# Patient Record
Sex: Female | Born: 1935 | Race: White | Hispanic: No | Marital: Married | Smoking: Former smoker
Health system: Southern US, Community
[De-identification: ages and names within clinical notes are randomized; demographics above are authoritative.]

## PROBLEM LIST (undated history)

## (undated) DIAGNOSIS — R519 Headache, unspecified: Secondary | ICD-10-CM

## (undated) DIAGNOSIS — K219 Gastro-esophageal reflux disease without esophagitis: Secondary | ICD-10-CM

## (undated) DIAGNOSIS — I10 Essential (primary) hypertension: Secondary | ICD-10-CM

## (undated) DIAGNOSIS — K279 Peptic ulcer, site unspecified, unspecified as acute or chronic, without hemorrhage or perforation: Secondary | ICD-10-CM

## (undated) DIAGNOSIS — E785 Hyperlipidemia, unspecified: Secondary | ICD-10-CM

## (undated) DIAGNOSIS — A0472 Enterocolitis due to Clostridium difficile, not specified as recurrent: Secondary | ICD-10-CM

## (undated) DIAGNOSIS — K5790 Diverticulosis of intestine, part unspecified, without perforation or abscess without bleeding: Secondary | ICD-10-CM

## (undated) DIAGNOSIS — R51 Headache: Secondary | ICD-10-CM

## (undated) DIAGNOSIS — N179 Acute kidney failure, unspecified: Secondary | ICD-10-CM

## (undated) DIAGNOSIS — J189 Pneumonia, unspecified organism: Secondary | ICD-10-CM

## (undated) DIAGNOSIS — F32A Depression, unspecified: Secondary | ICD-10-CM

## (undated) DIAGNOSIS — F329 Major depressive disorder, single episode, unspecified: Secondary | ICD-10-CM

## (undated) DIAGNOSIS — K7689 Other specified diseases of liver: Secondary | ICD-10-CM

## (undated) DIAGNOSIS — K635 Polyp of colon: Secondary | ICD-10-CM

## (undated) DIAGNOSIS — B9681 Helicobacter pylori [H. pylori] as the cause of diseases classified elsewhere: Secondary | ICD-10-CM

## (undated) DIAGNOSIS — K2970 Gastritis, unspecified, without bleeding: Secondary | ICD-10-CM

## (undated) HISTORY — PX: COLONOSCOPY: SHX174

## (undated) HISTORY — DX: Peptic ulcer, site unspecified, unspecified as acute or chronic, without hemorrhage or perforation: K27.9

## (undated) HISTORY — DX: Diverticulosis of intestine, part unspecified, without perforation or abscess without bleeding: K57.90

## (undated) HISTORY — DX: Hyperlipidemia, unspecified: E78.5

## (undated) HISTORY — DX: Other specified diseases of liver: K76.89

## (undated) HISTORY — DX: Gastritis, unspecified, without bleeding: K29.70

## (undated) HISTORY — DX: Polyp of colon: K63.5

## (undated) HISTORY — PX: ROTATOR CUFF REPAIR: SHX139

## (undated) HISTORY — DX: Helicobacter pylori (H. pylori) as the cause of diseases classified elsewhere: B96.81

## (undated) HISTORY — PX: TONSILLECTOMY AND ADENOIDECTOMY: SUR1326

## (undated) HISTORY — DX: Essential (primary) hypertension: I10

## (undated) HISTORY — PX: ABDOMINAL HYSTERECTOMY: SHX81

---

## 1985-12-10 DIAGNOSIS — K279 Peptic ulcer, site unspecified, unspecified as acute or chronic, without hemorrhage or perforation: Secondary | ICD-10-CM

## 1985-12-10 DIAGNOSIS — B9681 Helicobacter pylori [H. pylori] as the cause of diseases classified elsewhere: Secondary | ICD-10-CM

## 1985-12-10 HISTORY — DX: Peptic ulcer, site unspecified, unspecified as acute or chronic, without hemorrhage or perforation: K27.9

## 1985-12-10 HISTORY — DX: Helicobacter pylori (H. pylori) as the cause of diseases classified elsewhere: B96.81

## 1999-05-25 ENCOUNTER — Other Ambulatory Visit: Admission: RE | Admit: 1999-05-25 | Discharge: 1999-05-25 | Payer: Self-pay | Admitting: Gynecology

## 2000-07-08 ENCOUNTER — Ambulatory Visit (HOSPITAL_COMMUNITY): Admission: RE | Admit: 2000-07-08 | Discharge: 2000-07-08 | Payer: Self-pay | Admitting: Gynecology

## 2000-07-08 ENCOUNTER — Encounter: Payer: Self-pay | Admitting: Gynecology

## 2000-07-15 ENCOUNTER — Other Ambulatory Visit: Admission: RE | Admit: 2000-07-15 | Discharge: 2000-07-15 | Payer: Self-pay | Admitting: Gynecology

## 2001-04-15 ENCOUNTER — Encounter (INDEPENDENT_AMBULATORY_CARE_PROVIDER_SITE_OTHER): Payer: Self-pay | Admitting: *Deleted

## 2001-04-15 ENCOUNTER — Ambulatory Visit (HOSPITAL_COMMUNITY): Admission: RE | Admit: 2001-04-15 | Discharge: 2001-04-15 | Payer: Self-pay | Admitting: Gastroenterology

## 2001-04-17 ENCOUNTER — Encounter: Payer: Self-pay | Admitting: Gastroenterology

## 2001-04-17 ENCOUNTER — Encounter: Admission: RE | Admit: 2001-04-17 | Discharge: 2001-04-17 | Payer: Self-pay | Admitting: Gastroenterology

## 2001-04-25 ENCOUNTER — Ambulatory Visit (HOSPITAL_COMMUNITY): Admission: RE | Admit: 2001-04-25 | Discharge: 2001-04-25 | Payer: Self-pay | Admitting: Gastroenterology

## 2001-04-25 ENCOUNTER — Encounter: Payer: Self-pay | Admitting: Gastroenterology

## 2001-09-08 ENCOUNTER — Encounter: Payer: Self-pay | Admitting: Gynecology

## 2001-09-08 ENCOUNTER — Encounter: Admission: RE | Admit: 2001-09-08 | Discharge: 2001-09-08 | Payer: Self-pay | Admitting: Gynecology

## 2001-09-22 ENCOUNTER — Other Ambulatory Visit: Admission: RE | Admit: 2001-09-22 | Discharge: 2001-09-22 | Payer: Self-pay | Admitting: Gynecology

## 2003-01-18 ENCOUNTER — Encounter: Payer: Self-pay | Admitting: Gynecology

## 2003-01-18 ENCOUNTER — Encounter: Admission: RE | Admit: 2003-01-18 | Discharge: 2003-01-18 | Payer: Self-pay | Admitting: Gynecology

## 2003-02-15 ENCOUNTER — Other Ambulatory Visit: Admission: RE | Admit: 2003-02-15 | Discharge: 2003-02-15 | Payer: Self-pay | Admitting: Gynecology

## 2004-10-27 ENCOUNTER — Ambulatory Visit: Payer: Self-pay | Admitting: Internal Medicine

## 2005-01-02 ENCOUNTER — Ambulatory Visit: Payer: Self-pay | Admitting: Internal Medicine

## 2005-01-03 ENCOUNTER — Ambulatory Visit: Payer: Self-pay | Admitting: Internal Medicine

## 2005-01-19 ENCOUNTER — Ambulatory Visit: Payer: Self-pay | Admitting: Internal Medicine

## 2005-02-15 ENCOUNTER — Encounter: Admission: RE | Admit: 2005-02-15 | Discharge: 2005-02-15 | Payer: Self-pay | Admitting: Internal Medicine

## 2005-03-13 ENCOUNTER — Ambulatory Visit (HOSPITAL_BASED_OUTPATIENT_CLINIC_OR_DEPARTMENT_OTHER): Admission: RE | Admit: 2005-03-13 | Discharge: 2005-03-13 | Payer: Self-pay | Admitting: Orthopaedic Surgery

## 2005-03-13 ENCOUNTER — Ambulatory Visit (HOSPITAL_COMMUNITY): Admission: RE | Admit: 2005-03-13 | Discharge: 2005-03-13 | Payer: Self-pay | Admitting: Orthopaedic Surgery

## 2005-04-17 ENCOUNTER — Ambulatory Visit: Payer: Self-pay | Admitting: Internal Medicine

## 2005-10-31 ENCOUNTER — Ambulatory Visit: Payer: Self-pay | Admitting: Internal Medicine

## 2006-01-21 ENCOUNTER — Ambulatory Visit: Payer: Self-pay | Admitting: Internal Medicine

## 2006-03-21 ENCOUNTER — Ambulatory Visit: Payer: Self-pay | Admitting: Internal Medicine

## 2006-04-12 ENCOUNTER — Ambulatory Visit: Payer: Self-pay | Admitting: Internal Medicine

## 2006-07-23 ENCOUNTER — Ambulatory Visit: Payer: Self-pay | Admitting: Internal Medicine

## 2006-10-03 ENCOUNTER — Ambulatory Visit: Payer: Self-pay | Admitting: Internal Medicine

## 2006-10-15 ENCOUNTER — Ambulatory Visit: Payer: Self-pay | Admitting: Internal Medicine

## 2006-10-15 LAB — CONVERTED CEMR LAB
Basophils Relative: 0.2 % (ref 0.0–1.0)
Eosinophil percent: 3.7 % (ref 0.0–5.0)
HCT: 43.2 % (ref 36.0–46.0)
Hemoglobin: 14.4 g/dL (ref 12.0–15.0)
Lipase: 32 units/L (ref 11.0–59.0)
Monocytes Absolute: 0.5 10*3/uL (ref 0.2–0.7)
Neutrophils Relative %: 60.5 % (ref 43.0–77.0)
RBC: 4.63 M/uL (ref 3.87–5.11)
RDW: 12.1 % (ref 11.5–14.6)
WBC: 7.3 10*3/uL (ref 4.5–10.5)

## 2006-10-16 ENCOUNTER — Encounter: Admission: RE | Admit: 2006-10-16 | Discharge: 2006-10-16 | Payer: Self-pay | Admitting: Internal Medicine

## 2006-11-07 ENCOUNTER — Ambulatory Visit: Payer: Self-pay | Admitting: Internal Medicine

## 2006-12-10 HISTORY — PX: OTHER SURGICAL HISTORY: SHX169

## 2006-12-10 HISTORY — PX: CHOLECYSTECTOMY: SHX55

## 2007-02-26 ENCOUNTER — Ambulatory Visit: Payer: Self-pay | Admitting: Internal Medicine

## 2007-02-26 LAB — CONVERTED CEMR LAB
Cholesterol: 152 mg/dL (ref 0–200)
HDL: 61.4 mg/dL (ref >39.0)
LDL Cholesterol: 78 mg/dL (ref 0–99)
Total CHOL/HDL Ratio: 2.5

## 2007-03-11 ENCOUNTER — Emergency Department (HOSPITAL_COMMUNITY): Admission: EM | Admit: 2007-03-11 | Discharge: 2007-03-12 | Payer: Self-pay | Admitting: Emergency Medicine

## 2007-03-12 ENCOUNTER — Ambulatory Visit: Payer: Self-pay | Admitting: Internal Medicine

## 2007-03-20 ENCOUNTER — Encounter: Admission: RE | Admit: 2007-03-20 | Discharge: 2007-03-20 | Payer: Self-pay | Admitting: Family Medicine

## 2007-04-15 ENCOUNTER — Encounter: Admission: RE | Admit: 2007-04-15 | Discharge: 2007-04-15 | Payer: Self-pay | Admitting: Gastroenterology

## 2007-04-21 DIAGNOSIS — E785 Hyperlipidemia, unspecified: Secondary | ICD-10-CM

## 2007-04-21 DIAGNOSIS — Z87898 Personal history of other specified conditions: Secondary | ICD-10-CM | POA: Insufficient documentation

## 2007-04-21 DIAGNOSIS — K7689 Other specified diseases of liver: Secondary | ICD-10-CM

## 2007-04-21 DIAGNOSIS — I10 Essential (primary) hypertension: Secondary | ICD-10-CM | POA: Insufficient documentation

## 2007-04-22 ENCOUNTER — Encounter: Payer: Self-pay | Admitting: Internal Medicine

## 2007-05-08 ENCOUNTER — Encounter: Payer: Self-pay | Admitting: Internal Medicine

## 2007-06-16 ENCOUNTER — Encounter (INDEPENDENT_AMBULATORY_CARE_PROVIDER_SITE_OTHER): Payer: Self-pay | Admitting: *Deleted

## 2007-06-16 ENCOUNTER — Ambulatory Visit (HOSPITAL_COMMUNITY): Admission: RE | Admit: 2007-06-16 | Discharge: 2007-06-16 | Payer: Self-pay | Admitting: General Surgery

## 2007-06-16 ENCOUNTER — Encounter (INDEPENDENT_AMBULATORY_CARE_PROVIDER_SITE_OTHER): Payer: Self-pay | Admitting: General Surgery

## 2007-10-30 ENCOUNTER — Ambulatory Visit: Payer: Self-pay | Admitting: Internal Medicine

## 2008-03-12 ENCOUNTER — Ambulatory Visit: Payer: Self-pay | Admitting: Internal Medicine

## 2008-03-12 DIAGNOSIS — H04129 Dry eye syndrome of unspecified lacrimal gland: Secondary | ICD-10-CM | POA: Insufficient documentation

## 2008-03-12 LAB — CONVERTED CEMR LAB: LDL Goal: 100 mg/dL

## 2008-03-14 LAB — CONVERTED CEMR LAB
ALT: 23 units/L (ref 0–35)
AST: 19 units/L (ref 0–37)
Albumin: 4 g/dL (ref 3.5–5.2)
BUN: 16 mg/dL (ref 6–23)
HDL: 58.2 mg/dL (ref >39.0)
Potassium: 4.7 meq/L (ref 3.5–5.1)
Total Bilirubin: 0.7 mg/dL (ref 0.3–1.2)
Total CHOL/HDL Ratio: 3
Triglycerides: 99 mg/dL (ref 0–149)
VLDL: 20 mg/dL (ref 0–40)

## 2008-03-15 ENCOUNTER — Telehealth: Payer: Self-pay | Admitting: Internal Medicine

## 2008-03-15 ENCOUNTER — Encounter (INDEPENDENT_AMBULATORY_CARE_PROVIDER_SITE_OTHER): Payer: Self-pay | Admitting: *Deleted

## 2008-07-28 ENCOUNTER — Ambulatory Visit: Payer: Self-pay | Admitting: Internal Medicine

## 2008-07-28 DIAGNOSIS — F419 Anxiety disorder, unspecified: Secondary | ICD-10-CM | POA: Insufficient documentation

## 2008-07-28 DIAGNOSIS — N951 Menopausal and female climacteric states: Secondary | ICD-10-CM | POA: Insufficient documentation

## 2008-09-15 ENCOUNTER — Ambulatory Visit: Payer: Self-pay | Admitting: Internal Medicine

## 2009-02-10 ENCOUNTER — Telehealth (INDEPENDENT_AMBULATORY_CARE_PROVIDER_SITE_OTHER): Payer: Self-pay | Admitting: *Deleted

## 2009-03-01 ENCOUNTER — Ambulatory Visit: Payer: Self-pay | Admitting: Internal Medicine

## 2009-03-01 LAB — CONVERTED CEMR LAB
AST: 23 units/L (ref 0–37)
Alkaline Phosphatase: 96 units/L (ref 39–117)
Basophils Absolute: 0 10*3/uL (ref 0.0–0.1)
Bilirubin, Direct: 0.1 mg/dL (ref 0.0–0.3)
CO2: 28 meq/L (ref 19–32)
Calcium: 9.1 mg/dL (ref 8.4–10.5)
Cholesterol: 145 mg/dL (ref 0–200)
Creatinine, Ser: 0.9 mg/dL (ref 0.4–1.2)
Eosinophils Absolute: 0.3 10*3/uL (ref 0.0–0.7)
GFR calc non Af Amer: 65.22 mL/min (ref >60)
HCT: 41.2 % (ref 36.0–46.0)
Hemoglobin: 14.2 g/dL (ref 12.0–15.0)
LDL Cholesterol: 76 mg/dL (ref 0–99)
Lymphs Abs: 1.2 10*3/uL (ref 0.7–4.0)
MCHC: 34.5 g/dL (ref 30.0–36.0)
MCV: 91.3 fL (ref 78.0–100.0)
Monocytes Absolute: 0.8 10*3/uL (ref 0.1–1.0)
Neutro Abs: 5.3 10*3/uL (ref 1.4–7.7)
Platelets: 299 10*3/uL (ref 150.0–400.0)
RDW: 12.8 % (ref 11.5–14.6)
Sodium: 139 meq/L (ref 135–145)
TSH: 1.49 microintl units/mL (ref 0.35–5.50)
Total CHOL/HDL Ratio: 3
Triglycerides: 83 mg/dL (ref 0.0–149.0)

## 2009-03-07 ENCOUNTER — Ambulatory Visit: Payer: Self-pay | Admitting: Internal Medicine

## 2009-03-07 DIAGNOSIS — J069 Acute upper respiratory infection, unspecified: Secondary | ICD-10-CM | POA: Insufficient documentation

## 2009-03-07 DIAGNOSIS — Z860100 Personal history of colon polyps, unspecified: Secondary | ICD-10-CM | POA: Insufficient documentation

## 2009-03-07 DIAGNOSIS — R7309 Other abnormal glucose: Secondary | ICD-10-CM

## 2009-03-07 LAB — CONVERTED CEMR LAB: LDL Goal: 130 mg/dL

## 2009-03-08 ENCOUNTER — Ambulatory Visit: Payer: Self-pay | Admitting: Internal Medicine

## 2009-03-08 ENCOUNTER — Encounter (INDEPENDENT_AMBULATORY_CARE_PROVIDER_SITE_OTHER): Payer: Self-pay | Admitting: *Deleted

## 2009-09-14 ENCOUNTER — Ambulatory Visit: Payer: Self-pay | Admitting: Internal Medicine

## 2009-09-15 ENCOUNTER — Telehealth (INDEPENDENT_AMBULATORY_CARE_PROVIDER_SITE_OTHER): Payer: Self-pay | Admitting: *Deleted

## 2009-10-03 ENCOUNTER — Encounter: Admission: RE | Admit: 2009-10-03 | Discharge: 2009-10-03 | Payer: Self-pay | Admitting: Internal Medicine

## 2009-10-28 ENCOUNTER — Ambulatory Visit: Payer: Self-pay | Admitting: Internal Medicine

## 2009-10-28 DIAGNOSIS — K5730 Diverticulosis of large intestine without perforation or abscess without bleeding: Secondary | ICD-10-CM

## 2009-10-28 DIAGNOSIS — Z8719 Personal history of other diseases of the digestive system: Secondary | ICD-10-CM

## 2009-10-28 DIAGNOSIS — R109 Unspecified abdominal pain: Secondary | ICD-10-CM

## 2009-11-01 LAB — CONVERTED CEMR LAB
Basophils Relative: 0 % (ref 0–1)
Eosinophils Absolute: 0.3 10*3/uL (ref 0.0–0.7)
Eosinophils Relative: 3 % (ref 0–5)
HCT: 39.2 % (ref 36.0–46.0)
Lymphs Abs: 2.7 10*3/uL (ref 0.7–4.0)
MCHC: 33.7 g/dL (ref 30.0–36.0)
MCV: 89.9 fL (ref 78.0–100.0)
Platelets: 297 10*3/uL (ref 150–400)
WBC: 8.2 10*3/uL (ref 4.0–10.5)

## 2010-03-15 ENCOUNTER — Telehealth (INDEPENDENT_AMBULATORY_CARE_PROVIDER_SITE_OTHER): Payer: Self-pay | Admitting: *Deleted

## 2010-04-11 ENCOUNTER — Ambulatory Visit: Payer: Self-pay | Admitting: Internal Medicine

## 2010-05-24 ENCOUNTER — Ambulatory Visit: Payer: Self-pay | Admitting: Internal Medicine

## 2010-05-25 ENCOUNTER — Ambulatory Visit: Payer: Self-pay | Admitting: Internal Medicine

## 2010-05-29 LAB — CONVERTED CEMR LAB
AST: 23 units/L (ref 0–37)
Bilirubin, Direct: 0.2 mg/dL (ref 0.0–0.3)
HDL: 60.5 mg/dL (ref >39.00)
Total Bilirubin: 1 mg/dL (ref 0.3–1.2)
Total CHOL/HDL Ratio: 2
VLDL: 13 mg/dL (ref 0.0–40.0)

## 2010-06-01 ENCOUNTER — Telehealth: Payer: Self-pay | Admitting: Internal Medicine

## 2010-06-02 ENCOUNTER — Ambulatory Visit: Payer: Self-pay | Admitting: Internal Medicine

## 2010-06-05 LAB — CONVERTED CEMR LAB
Basophils Relative: 0.5 % (ref 0.0–3.0)
Eosinophils Relative: 3.7 % (ref 0.0–5.0)
HCT: 39.2 % (ref 36.0–46.0)
Lymphs Abs: 1.9 10*3/uL (ref 0.7–4.0)
MCV: 91.9 fL (ref 78.0–100.0)
Monocytes Absolute: 0.5 10*3/uL (ref 0.1–1.0)
RBC: 4.26 M/uL (ref 3.87–5.11)
WBC: 6.8 10*3/uL (ref 4.5–10.5)

## 2010-06-09 ENCOUNTER — Telehealth (INDEPENDENT_AMBULATORY_CARE_PROVIDER_SITE_OTHER): Payer: Self-pay | Admitting: *Deleted

## 2010-06-16 ENCOUNTER — Ambulatory Visit: Payer: Self-pay | Admitting: Internal Medicine

## 2010-06-19 ENCOUNTER — Encounter: Payer: Self-pay | Admitting: Internal Medicine

## 2010-09-29 ENCOUNTER — Ambulatory Visit: Payer: Self-pay | Admitting: Internal Medicine

## 2010-12-31 ENCOUNTER — Encounter: Payer: Self-pay | Admitting: Internal Medicine

## 2011-01-09 NOTE — Assessment & Plan Note (Signed)
Summary: BAD COLD, ? ALLERGY/RH......   Vital Signs:  Patient profile:   77 year old female Weight:      183 pounds BMI:     30.81 Temp:     98.7 degrees F oral Pulse rate:   72 / minute Resp:     17 per minute BP sitting:   116 / 68  (left arm) Cuff size:   large  Vitals Entered By: Shonna Chock (Apr 11, 2010 1:48 PM) CC: Bad cold/ allergies x 3 weeks, Cough Comments REVIEWED MED LIST, PATIENT AGREED DOSE AND INSTRUCTION CORRECT      CC:  Bad cold/ allergies x 3 weeks and Cough.  History of Present Illness:  Cough      This is a 77 year old woman who presents with Cough X 2 & 1/2 weeks, but 1 other episode self treated in 12/2009. Both began with ST. The patient reports productive cough with yellow sputum in am,  low grade fever, and malaise, but denies pleuritic chest pain, shortness of breath, wheezing, exertional dyspnea, and hemoptysis.  Associated symtpoms include some  nasal congestion.  The patient denies the following symptoms: cold/URI symptoms, sore throat, chronic rhinitis, weight loss, acid reflux symptoms, and peripheral edema.  Partially effective prior treatments have included OTC cough medication, Coricidin HP.  Risk factors include history of allergic rhinitis and history of reflux.  Both episodes occured after visits to 64 year old twin grandchildren whohave recureent rhinitis.NO PMH of asthma. She had flu shot in Fall.Non smoker since 97.  Allergies (verified): No Known Drug Allergies  Review of Systems General:  Denies chills and sweats. ENT:  Denies ear discharge and earache; minor frontal headache; no facial pain . Some purulence fro nose. CV:  Denies difficulty breathing at night and difficulty breathing while lying down. Resp:  Denies chest pain with inspiration and coughing up blood. Allergy:  Denies itching eyes and sneezing.  Physical Exam  General:  in no acute distress; alert,appropriate and cooperative throughout examination Ears:  External ear  exam shows no significant lesions or deformities.  Otoscopic examination reveals clear canals, tympanic membranes are intact bilaterally without bulging, retraction, inflammation or discharge. Hearing is grossly normal bilaterally. Nose:  External nasal examination shows no deformity or inflammation. Nasal mucosa are pink and moist without lesions or exudates.Hyponasal speech Mouth:  Oral mucosa and oropharynx without lesions or exudates.  Teeth in good repair. Upper plate.  Lungs:  Normal respiratory effort, chest expands symmetrically. Lungs are clear to auscultation, no crackles or wheezes. Slightly  decreased BS Heart:  regular rhythm and bradycardia.  S4 Cervical Nodes:  No lymphadenopathy noted Axillary Nodes:  No palpable lymphadenopathy   Impression & Recommendations:  Problem # 1:  BRONCHITIS-ACUTE (ICD-466.0)  recurrent related to sick grandchild exposure Her updated medication list for this problem includes:    Ciprofloxacin Hcl 500 Mg Tabs (Ciprofloxacin hcl) .Marland Kitchen... 1 two times a day    Metronidazole in Nacl 5-0.79 Mg/ml-% Soln (Metronidazole in nacl) .Marland Kitchen... 1 three times a day    Amoxicillin-pot Clavulanate 875-125 Mg Tabs (Amoxicillin-pot clavulanate) .Marland Kitchen... 1 every 12 hrs with a meal    Promethazine Vc/codeine 6.25-5-10 Mg/82ml Syrp (Phenyleph-promethazine-cod) .Marland Kitchen... 1 tsp every 6 hrs as needed cough  Orders: Prescription Created Electronically [phone])  Problem # 2:  URI (ICD-465.9)  Her updated medication list for this problem includes:    Claritin 10 Mg Caps (Loratadine) .Marland Kitchen... 1 by mouth once daily as needed    Promethazine Vc/codeine  6.25-5-10 Mg/70ml Syrp (Phenyleph-promethazine-cod) .Marland Kitchen... 1 tsp every 6 hrs as needed cough  Orders: Prescription Created Electronically [id number])  Complete Medication List: 1)  Hydrochlorothiazide 25 Mg Tabs (Hydrochlorothiazide) .... Take one half tablet daily 2)  Restatis  .... Two times a day 3)  Trandolapril 4 Mg Tabs (Trandolapril)  .... Once daily as directed 4)  Metoprolol Tartrate 25 Mg Tabs (Metoprolol tartrate) .Marland Kitchen.. 1 qd 5)  Catapres 0.1 Mg Tabs (Clonidine hcl) .... 1/2 two times a day 6)  Lorazepam 0.5 Mg Tabs (Lorazepam) .Marland Kitchen.. 1 at bedtime as needed 7)  Fish Oil Oil (Fish oil) .... 2 by mouth once daily 8)  Claritin 10 Mg Caps (Loratadine) .Marland Kitchen.. 1 by mouth once daily as needed 9)  Ciprofloxacin Hcl 500 Mg Tabs (Ciprofloxacin hcl) .Marland Kitchen.. 1 two times a day 10)  Metronidazole in Nacl 5-0.79 Mg/ml-% Soln (Metronidazole in nacl) .Marland Kitchen.. 1 three times a day 11)  Tramadol Hcl 50 Mg Tabs (Tramadol hcl) .Marland Kitchen.. 1 q 6 hrs as needed pain 12)  Simvastatin 40 Mg Tabs (Simvastatin) .Marland Kitchen.. 1 at bedtime in place of vytorin 10/20; fasting labs after 10 weeks 13)  Amoxicillin-pot Clavulanate 875-125 Mg Tabs (Amoxicillin-pot clavulanate) .Marland Kitchen.. 1 every 12 hrs with a meal 14)  Promethazine Vc/codeine 6.25-5-10 Mg/75ml Syrp (Phenyleph-promethazine-cod) .Marland Kitchen.. 1 tsp every 6 hrs as needed cough  Patient Instructions: 1)  Drink as much fluid as you can tolerate for the next few days. Neti pot once daily as needed for  head congestion. CXray if no better post antibiotics Prescriptions: PROMETHAZINE VC/CODEINE 6.25-5-10 MG/5ML SYRP (PHENYLEPH-PROMETHAZINE-COD) 1 tsp every 6 hrs as needed cough  #120cc x 0   Entered and Authorized by:   Marga Melnick MD   Signed by:   Marga Melnick MD on 04/11/2010   Method used:   Printed then faxed to ...       [id number]       [address]       [place], [place]  [address]       Ph: [phone] or [phone]       Fax: [phone]   RxID:   [id number] AMOXICILLIN-POT CLAVULANATE 875-125 MG TABS (AMOXICILLIN-POT CLAVULANATE) 1 every 12 hrs with a meal  #20 x 0   Entered and Authorized by:   Marga Melnick MD   Signed by:   Marga Melnick MD on 04/11/2010   Method used:   [facility]axed to ...       [id number]       [address]        [place], [place]  [address]       Ph: [phone] or [phone]       Fax: [phone]   RxID:   [id number]

## 2011-01-09 NOTE — Assessment & Plan Note (Signed)
Summary: congested /cbs   Vital Signs:  Patient profile:   77 year old female Weight:      181.4 pounds Temp:     98.7 degrees F oral Pulse rate:   76 / minute Resp:     17 per minute BP sitting:   126 / 70  (left arm) Cuff size:   large  Vitals Entered By: Shonna Chock (May 24, 2010 4:17 PM) CC: 3rd x this year patient with cough and congestion Comments REVIEWED MED LIST, PATIENT AGREED DOSE AND INSTRUCTION CORRECT    CC:  3rd x this year patient with cough and congestion.  History of Present Illness: Onset 05/19/2010 as NP cough followed by malaise w/o infectios  exposures. This is 3rd episode since 12/2009. Rx: hydration, NSAIDS, Tylenol,  cough syrup w/o benefit  Preventive Screening-Counseling & Management  Alcohol-Tobacco     Smoking Status: quit > 6 months     Packs/Day: 1.0     Year Started: 1955     Year Quit: 1975  Allergies (verified): No Known Drug Allergies  Social History: Smoking Status:  quit > 6 months Packs/Day:  1.0  Review of Systems General:  Complains of fever; denies chills and sweats; Low grade temp. ENT:  Complains of sore throat; denies ear discharge, earache, nasal congestion, and sinus pressure; No frontal headache , facial pain or purulence. Resp:  Complains of shortness of breath and wheezing; denies chest pain with inspiration and coughing up blood; No PMH of asthma; D/Ced cig 1985.  Physical Exam  General:  in no acute distress; alert,appropriate and cooperative throughout examination Ears:  External ear exam shows no significant lesions or deformities.  Otoscopic examination reveals clear canals, tympanic membranes are intact bilaterally without bulging, retraction, inflammation or discharge. Hearing is grossly normal bilaterally. Nose:  External nasal examination shows no deformity or inflammation. Nasal mucosa are pink and moist without lesions or exudates. Mouth:  Oral mucosa and oropharynx without lesions or exudates.  Mild  pharyngeal erythema.   Lungs:  Normal respiratory effort, chest expands symmetrically. Lungs : mild rales RLL Heart:  Normal rate and regular rhythm. S1 and S2 normal without gallop, murmur, click, rub.S4 Cervical Nodes:  No lymphadenopathy noted Axillary Nodes:  No palpable lymphadenopathy   Impression & Recommendations:  Problem # 1:  BRONCHITIS-ACUTE (ICD-466.0)  Recurrent, R?O RAD  The following medications were removed from the medication list:    Ciprofloxacin Hcl 500 Mg Tabs (Ciprofloxacin hcl) .Marland Kitchen... 1 two times a day    Metronidazole in Nacl 5-0.79 Mg/ml-% Soln (Metronidazole in nacl) .Marland Kitchen... 1 three times a day    Amoxicillin-pot Clavulanate 875-125 Mg Tabs (Amoxicillin-pot clavulanate) .Marland Kitchen... 1 every 12 hrs with a meal Her updated medication list for this problem includes:    Promethazine Vc/codeine 6.25-5-10 Mg/61ml Syrp (Phenyleph-promethazine-cod) .Marland Kitchen... 1 tsp every 6 hrs as needed cough    Azithromycin 250 Mg Tabs (Azithromycin) .Marland Kitchen... As per pack  Orders: T-2 View CXR (71020TC) Misc. Referral (Misc. Ref)  Complete Medication List: 1)  Hydrochlorothiazide 25 Mg Tabs (Hydrochlorothiazide) .... Take one half tablet daily 2)  Restatis  .... Two times a day 3)  Trandolapril 4 Mg Tabs (Trandolapril) .... Once daily as directed 4)  Metoprolol Tartrate 25 Mg Tabs (Metoprolol tartrate) .Marland Kitchen.. 1 qd 5)  Catapres 0.1 Mg Tabs (Clonidine hcl) .... 1/2 two times a day 6)  Lorazepam 0.5 Mg Tabs (Lorazepam) .Marland Kitchen.. 1 at bedtime as needed 7)  Fish Oil Oil (Fish oil) .... 2 by  mouth once daily 8)  Claritin 10 Mg Caps (Loratadine) .Marland Kitchen.. 1 by mouth once daily as needed 9)  Tramadol Hcl 50 Mg Tabs (Tramadol hcl) .Marland Kitchen.. 1 q 6 hrs as needed pain 10)  Simvastatin 40 Mg Tabs (Simvastatin) .Marland Kitchen.. 1 at bedtime in place of vytorin 10/20; fasting labs after 10 weeks 11)  Promethazine Vc/codeine 6.25-5-10 Mg/74ml Syrp (Phenyleph-promethazine-cod) .Marland Kitchen.. 1 tsp every 6 hrs as needed cough 12)  Azithromycin 250 Mg  Tabs (Azithromycin) .... As per pack  Patient Instructions: 1)  Drink as much fluid as you can tolerate for the next few days. 2)  CBC w/ Diff prior to visit, ICD-9:466.0 Prescriptions: AZITHROMYCIN 250 MG TABS (AZITHROMYCIN) as per pack  #1 x 0   Entered and Authorized by:   Marga Melnick MD   Signed by:   Marga Melnick MD on 05/24/2010   Method used:   [facility] to ...       [id number]       [address]       [place], [place]  [address]       Ph: [phone] or [phone]       Fax: [phone]   RxID:   [id number]

## 2011-01-09 NOTE — Progress Notes (Signed)
Summary: Ongoing Concerns  Phone Note Call from Patient   Caller: Patient Summary of Call: Message taken on Triage VM: Patient was seen about 1 week ago and still with discolored congestion and cough. Patient took ABX and doesnt know what else to do at this point  Dr.Hopper please advise  Shonna Chock  June 01, 2010 8:59 AM   Follow-up for Phone Call        see new Rx Follow-up by: Marga Melnick MD,  June 01, 2010 2:06 PM  Additional Follow-up for Phone Call Additional follow up Details #1::        DISCUSS WITH PATIENT ...Marland KitchenMarland KitchenFelecia Deloach CMA  June 01, 2010 2:51 PM     New/Updated Medications: METRONIDAZOLE 500 MG TABS (METRONIDAZOLE) 1 three times a day #21 Prescriptions: METRONIDAZOLE 500 MG TABS (METRONIDAZOLE) 1 three times a day #21  #0 x 0   Entered by:   Jeremy Johann CMA   Authorized by:   Marga Melnick MD   Signed by:   Jeremy Johann CMA on 06/01/2010   Method used:   [facility]ed to ...       [id number]       [address]       [place], [place]  [address]       Ph: [phone] or [phone]       Fax: [phone]   RxID:   [id number]

## 2011-01-09 NOTE — Assessment & Plan Note (Signed)
Summary: FLU//PH  Nurse Visit  CC: Flu shot./kb   Allergies: No Known Drug Allergies  Orders Added: 1)  Flu Vaccine 62yrs + MEDICARE PATIENTS [Q2039] 2)  Administration Flu vaccine - MCR [G0008]           Flu Vaccine Consent Questions     Do you have a history of severe allergic reactions to this vaccine? no    Any prior history of allergic reactions to egg and/or gelatin? no    Do you have a sensitivity to the preservative Thimersol? no    Do you have a past history of Guillan-Barre Syndrome? no    Do you currently have an acute febrile illness? no    Have you ever had a severe reaction to latex? no    Vaccine information given and explained to patient? yes    Are you currently pregnant? no    Lot Number:[id number]   Exp Date:06/09/2011   Site Given  Right Deltoid IMu

## 2011-01-09 NOTE — Progress Notes (Signed)
Summary: Cold Symptoms  Phone Note Call from Patient Call back at Home Phone [phone]   Caller: Patient Summary of Call: Patient called to let us know that she is feeling about 90% better. She still has a small cough still with greenish-yellowish sputum. She said she took her last dose of abx today. She wants to know what she should do now.  Please advise! Initial call taken by: Harold Barban,  June 09, 2010 10:45 AM  Follow-up for Phone Call        Lowella Bandy , this should have gone to [facility] or [facility]. I got to it when I got back to [place] 07/02 & refiloled Metronidazole #21. Thanks, Hopp Follow-up by: Marga Melnick MD,  June 10, 2010 7:06 PM  Additional Follow-up for Phone Call Additional follow up Details #1::        spoke w/ patient says that she has finished antibiotic says that phelgm is a little yellow doing some coughing but not coughing up alot advised patient to try mucinex to help bring up congestion to prevent further illness and she says that she has an appt w/ pulmonary on friday so will take until then and use cough med as needed if cough spells are to bad but otherwise patient is feeling better........Marland KitchenDoristine Devoid  June 13, 2010 5:12 PM

## 2011-01-09 NOTE — Miscellaneous (Signed)
Summary: Orders Update pft charges  Clinical Lists Changes  Orders: Added new Service order of Carbon Monoxide diffusing w/capacity (94720) - Signed Added new Service order of Lung Volumes (94240) - Signed Added new Service order of Spirometry (Pre & Post) (94060) - Signed 

## 2011-01-09 NOTE — Progress Notes (Signed)
Summary: Vytorin too expensive  Phone Note Call from Patient Call back at Home Phone [phone]   Caller: Patient Summary of Call: Message left on VM: Patient would like Vytorin change to Simvastatin. Vytorin is too expensive!! [facility]  Dr.Hopper please advise on med change Toni [name]  March 15, 2010 1:41 PM    Follow-up for Phone Call        Spoke with patient, scheduled appointment for June 20th at [facility] to check labs Follow-up by: Shonna Chock,  March 15, 2010 3:19 PM    New/Updated Medications: SIMVASTATIN 40 MG TABS (SIMVASTATIN) 1 at bedtime in place of Vytorin 10/20; fasting labs after 10 weeks Prescriptions: SIMVASTATIN 40 MG TABS (SIMVASTATIN) 1 at bedtime in place of Vytorin 10/20; fasting labs after 10 weeks  #90 x 0   Entered and Authorized by:   Marga Melnick MD   Signed by:   Shonna Chock on 03/15/2010   Method used:   [facility]ronically [facility]        [id number]       [address]       [place], [place]  [address]       Ph: [phone] or [phone]       Fax: [phone]   RxID:   [id number]

## 2011-01-30 ENCOUNTER — Encounter: Payer: Self-pay | Admitting: Internal Medicine

## 2011-02-05 ENCOUNTER — Encounter: Payer: Self-pay | Admitting: Internal Medicine

## 2011-02-13 ENCOUNTER — Telehealth (INDEPENDENT_AMBULATORY_CARE_PROVIDER_SITE_OTHER): Payer: Self-pay | Admitting: *Deleted

## 2011-02-15 NOTE — Letter (Signed)
Summary: [facility]  [facility]   Imported By: Lanelle Bal 02/07/2011 14:22:38  _____________________________________________________________________  External Attachment:    Type:   Image     Comment:   External Document

## 2011-02-20 NOTE — Progress Notes (Signed)
Summary: refill  Phone Note Refill Request Message from:  Fax from Pharmacy on February 13, 2011 10:34 AM  Refills Requested: Medication #1:  LORAZEPAM 0.5 MG  TABS 1 at bedtime as needed [address] - fax [phone]  Initial call taken by: Okey Regal [place],  February 13, 2011 10:34 AM    Prescriptions: LORAZEPAM 0.5 MG  TABS (LORAZEPAM) 1 at bedtime as needed  #30 x 2   Entered by:   Shonna Chock CMA   Authorized by:   Marga Melnick MD   Signed by:   Shonna Chock CMA on 02/13/2011   Method used:   Printed then faxed to ...       [id number]       [address]       [place], [place]  [address]       Ph: [phone] or [phone]       Fax: [phone]   RxID:   [id number]

## 2011-02-20 NOTE — Procedures (Signed)
Summary: Colonoscopy Report/[facility]  Colonoscopy Report/[facility]   Imported By: Maryln Gottron 02/13/2011 15:44:25  _____________________________________________________________________  External Attachment:    Type:   Image     Comment:   External Document

## 2011-03-05 ENCOUNTER — Other Ambulatory Visit: Payer: Self-pay | Admitting: Internal Medicine

## 2011-03-10 ENCOUNTER — Other Ambulatory Visit: Payer: Self-pay | Admitting: Internal Medicine

## 2011-04-18 ENCOUNTER — Other Ambulatory Visit: Payer: Self-pay

## 2011-04-18 MED ORDER — METOPROLOL TARTRATE 25 MG PO TABS: 25.0000 mg | ORAL_TABLET | Freq: Every day | ORAL | Status: DC

## 2011-04-18 NOTE — Telephone Encounter (Signed)
Patient needs to schedule a CPX  

## 2011-04-20 ENCOUNTER — Other Ambulatory Visit: Payer: Self-pay | Admitting: Internal Medicine

## 2011-04-20 MED ORDER — METOPROLOL TARTRATE 25 MG PO TABS: 25.0000 mg | ORAL_TABLET | Freq: Every day | ORAL | Status: DC

## 2011-04-24 NOTE — Op Note (Signed)
NAMEGYNETH, HUBKA NO.:  [id number]   MEDICAL RECORD NO.:  [id number]          PATIENT TYPE:  OIB   LOCATION:  2550                         FACILITY:  [facility]   PHYSICIAN:  Adolph Pollack, M.D.DATE OF BIRTH:  Aug 09, 1934   DATE OF PROCEDURE:  06/16/2007  DATE OF DISCHARGE:                               OPERATIVE REPORT   PREOPERATIVE DIAGNOSIS:  Biliary dyskinesia.   POSTOPERATIVE DIAGNOSIS:  Biliary dyskinesia.  Multiple liver cysts and  liver lesion, anterior aspect of the left medial lobe.   PROCEDURE:  1. Laparoscopic cholecystectomy with intraoperative cholangiogram.  2. Wedge biopsy of liver lesion.   SURGEON:  Adolph Pollack, M.D.   ASSISTANT:  Wilmon Arms. Tsuei, M.D.   ANESTHESIA:  General.   INDICATIONS:  This 77 year old female has postprandial epigastric  pressure-type pain and right upper quadrant pain increasing in  frequency.  A lot of times, it tends to follow a fatty meal.  She has  had a significant evaluation including upper endoscopy and CT scans and  ultrasound.  No gallbladder disease was noted on these tests.  However,  a nuclear medicine hepatic biliary scan demonstrated borderline  decreased gallbladder function.  She now presents for elective  cholecystectomy.  We discussed the procedure risks and the potential  failure to alleviate her symptoms preoperatively.   TECHNIQUE:  She was seen in the holding area and then brought to the  operating room, placed on the operating table and a general anesthetic  was administered.  The abdominal wall was sterilely prepped and draped.  Dilute Marcaine solution was infiltrated in the subumbilical region.  A  small subumbilical incision was made through the skin, subcutaneous  tissue and fascia and peritoneum entering the peritoneal cavity under  direct vision.  A pursestring suture of 0 Vicryl was placed around the  fascial edges.  A Hassan trocar was induced into the peritoneal  cavity,  and pneumoperitoneum was created by insufflation of CO2 gas.   Next, laparoscope introduced.  She was placed in reversed Trendelenburg  position, the right side tilted slightly up.  An 11-mm incision was  placed in the epigastric region and two 5-mm incisions placed in right  mid lateral abdomen. Multiple liver cysts were noted. The fundus of the  gallbladder was grasped.  There were adhesions between the omentum and  the body of the gallbladder which were taken down bluntly.  The fundus  was then retracted toward the right shoulder.  The infundibulum was  grasped and mobilized.  The cystic duct was identified, and a window was  created around it.  A clip was placed just above the cystic duct  gallbladder junction, and an incision was made at the cystic duct  gallbladder junction with evacuation of bile.  The cholangiocatheter was  passed through the anterior abdominal wall, placed into the cystic duct,  and a cholangiogram was performed.   Under real time fluoroscopy, dilute contrast was injected into the  cystic duct.  Common hepatic, right and left hepatic, and common bile  ducts all filled and the contrast drained into the duodenum.  No obvious  evidence of obstruction was noted.  Follow-up report pending the  radiologist's interpretation.   The cholangiocatheter was removed, the cystic duct was clipped three  times proximally and divided.  I found an anterior and posterior branch  of the cystic artery and created windows around these.  There were  clipped and divided.  I dissected the gallbladder free from the liver  using electrocautery and placed it in an Endopouch bag.   I inspected the surface of liver and saw multiple cysts and a white,  firm lesion in the anterior aspect of left medial lobe which did not  look cystic in nature.  I went ahead and used electrocautery to perform  a wedge biopsy of this and sent as a separate specimen.  The biopsy  cavity bleeding was  controlled electrocautery and Surgicel.   The gallbladder fossa was copiously irrigated.  It was hemostatic.  No  bile leak was noted.  The fluid was evacuated.   The gallbladder was then removed in the bag through the subumbilical  port.  The subumbilical fascial defect was closed by tightening up and  tying down the pursestring suture.  At this point, I noted some omental  adhesions in the lower abdominal wall from her previous hysterectomy.   The remaining trocars were removed, and pneumoperitoneum was released.  The skin incisions were closed with 4-0 Monocryl subcuticular stitches  followed by Steri-Strips and sterile dressings.   She tolerated the procedure well without apparent complications and was  taken to the recovery in satisfactory condition.      Adolph Pollack, M.D.  Electronically Signed     TJR/MEDQ  D:  06/16/2007  T:  06/16/2007  Job:  [id number]   cc:   Anselmo Rod, M.D.  Titus Dubin. Alwyn Ren, MD,FACP,FCCP  Remi Deter

## 2011-04-27 NOTE — Op Note (Signed)
Toni [name], [name] NO.:  [id number]   MEDICAL RECORD NO.:  [id number]          PATIENT TYPE:  AMB   LOCATION:  DSC                          FACILITY:  [facility]   PHYSICIAN:  Toni [name], M.D.DATE OF BIRTH:  05-Apr-1934   DATE OF PROCEDURE:  03/13/2005  DATE OF DISCHARGE:                                 OPERATIVE REPORT   PREOPERATIVE DIAGNOSES:  1.  Right shoulder impingement.  2.  Right shoulder rotator cuff tear.  3.  Right shoulder acromioclavicular degeneration.   POSTOPERATIVE DIAGNOSES:  1.  Right shoulder impingement.  2.  Right shoulder rotator cuff tear.  3.  Right shoulder acromioclavicular degeneration.   PROCEDURES:  1.  Right shoulder arthroscopic acromioplasty.  2.  Right shoulder arthroscopic rotator cuff repair.  3.  Right shoulder arthroscopic acromioclavicular resection.   ANESTHESIA:  General and block.   ATTENDING SURGEON:  Toni Basque. Toni [name], M.D.   ASSISTANT:  None.   INDICATION FOR PROCEDURE:  The patient is a 77 year old woman with a long  history right shoulder pain.  This has persisted, despite oral anti-  inflammatories and an injection which only afforded her a couple of days of  relief.  She has been doing an exercise program which does not seem to be  helping.  She has pain at rest and pain with activities and has a terrible  catch in her shoulder which limits her ability to remain active.  By MRI  scan, she has a near full-thickness rotator cuff tear and AC degeneration  with impingement on the rotator cuff.  She is offered an arthroscopy.  Informed operative consent was obtained after discussion of possible  complications of reaction to anesthesia and infection.   DESCRIPTION OF PROCEDURE:  The patient was taken to the operating suite  where a general anesthetic was applied without difficulty.  She was also  given a block in the preanesthesia area.  She was positioned in the beach-  chair position and prepped and  draped in normal sterile fashion.  After the  administration of preop IV antibiotics, an arthroscopy of the right shoulder  was performed through a total of 3 portals.  Glenohumeral joint showed no  degenerative changes and the biceps tendon appeared intact through the  shoulder.  The rotator cuff tear did appear to have a small tear from below.  In the subacromial space, she had some bursitis addressed with a bursectomy.  She had a prominent subacromial morphology addressed with an acromioplasty  back to a flat surface.  This was done with a bur in the lateral position  followed by transfer of the bur to the posterior position.  She also had  bone-on-bone contact at the [facility] joint and a formal AC decompression was done  through the anterior portal with an arthroscopic bur.  Rotator cuff was  examined and she did have about a centimeter-sized tear with complete  detachment of the greater tuberosity of a portion of the supraspinatus.  I  created a bleeding bed of bone with an arthroscopic bur and then repaired  this arthroscopically with a single absorbable anchor by  Arthrex.  The 2  FiberWire sutures were passed through the rotator cuff and tied in simple  fashion back to the bleeding bed of bone.  The shoulder was thoroughly  irrigated, followed by closure of the portals loosely with nylon.  Adaptic  was placed on the portals, followed by dry gauze and tape.  Estimated blood  loss and intraoperative fluids can be obtained from anesthesia records.   DISPOSITION:  The patient was extubated in the operating room and taken to  the recovery room in stable addition.  Plans were for her to go home the  same day and follow up in the office next week.  I will contact her by phone  tonight.      PGD/MEDQ  D:  03/13/2005  T:  03/14/2005  Job:  [id number]

## 2011-04-27 NOTE — Procedures (Signed)
[facility]  Patient:    Toni [name], Toni [name]                  MRN: [id number] Proc. Date: 04/15/01 Adm. Date:  [id number] Attending:  Charna Elizabeth CC:         Timothy P. Fontaine, M.D.   Procedure Report  DATE OF BIRTH:  [birth year].  REFERRING PHYSICIAN:  Timothy P. Fontaine, M.D.  PROCEDURE PERFORMED:  Colonoscopy with biopsies.  ENDOSCOPIST:  Anselmo Rod, M.D.  INSTRUMENT USED:  Olympus video colonoscope.  INDICATIONS FOR PROCEDURE:  The patient is a 77 year old white female with a history of guaiac positive stool and abdominal pain, rule out colonic polyps, masses, hemorrhoids etc.  PREPROCEDURE PREPARATION:  Informed consent was procured from the patient. The patient was fasted for eight hours prior to the procedure and prepped with a bottle of magnesium citrate and a gallon of NuLytely the night prior to the procedure.  PREPROCEDURE PHYSICAL:  The patient had stable vital signs.  Neck supple. Chest clear to auscultation.  S1, S2 regular.  Abdomen soft with normal abdominal bowel sounds.  No hepatosplenomegaly appreciated.  DESCRIPTION OF PROCEDURE:  The patient was placed in the left lateral decubitus position and sedated with 70 mg of Demerol and 7 mg of Versed intravenously.  Once the patient was adequately sedated and maintained on low-flow oxygen and continuous cardiac monitoring, the Olympus video colonoscope was advanced from the rectum to the cecum with slight difficulty secondary to some residual stool in the colon.  There were small external hemorrhoids appreciated on anal inspection with small sessile polyp removed by regular biopsy forceps from the rectum at 5 cm.  There was left-sided diverticulosis.  The rest of the colonic mucosa appeared healthy.  Very small lesions could have been missed because of residual stool in the colon but no large masses or polyps were seen.  IMPRESSION: 1. Small external  hemorrhoid. 2. Small polyp biopsied 5 cm. 3. Left-sided diverticulosis. 4. Some residual stool in the colon. 5. Normal-appearing cecum and terminal ileum, right colon, transverse colon.  RECOMMENDATIONS: 1. Await pathology results. 2. Increase fluid and fiber in the diet. 3. Outpatient follow-up after CT scan of the abdomen and HIDA scan has been    done.DD:  04/15/01 TD:  04/15/01 Job: [id number]

## 2011-06-21 ENCOUNTER — Other Ambulatory Visit: Payer: Self-pay | Admitting: Internal Medicine

## 2011-06-21 NOTE — Telephone Encounter (Signed)
Due for CPX 

## 2011-07-23 ENCOUNTER — Other Ambulatory Visit: Payer: Self-pay | Admitting: Internal Medicine

## 2011-07-24 ENCOUNTER — Other Ambulatory Visit: Payer: Self-pay | Admitting: Internal Medicine

## 2011-08-10 ENCOUNTER — Ambulatory Visit (INDEPENDENT_AMBULATORY_CARE_PROVIDER_SITE_OTHER): Payer: Medicare Other | Admitting: Internal Medicine

## 2011-08-10 ENCOUNTER — Encounter: Payer: Self-pay | Admitting: Internal Medicine

## 2011-08-10 VITALS — BP 140/82 | HR 69 | Temp 98.4°F | Resp 20 | Ht 64.5 in | Wt 181.0 lb

## 2011-08-10 DIAGNOSIS — M8580 Other specified disorders of bone density and structure, unspecified site: Secondary | ICD-10-CM

## 2011-08-10 DIAGNOSIS — Z87898 Personal history of other specified conditions: Secondary | ICD-10-CM

## 2011-08-10 DIAGNOSIS — D126 Benign neoplasm of colon, unspecified: Secondary | ICD-10-CM

## 2011-08-10 DIAGNOSIS — I10 Essential (primary) hypertension: Secondary | ICD-10-CM

## 2011-08-10 DIAGNOSIS — Z0000 Encounter for general adult medical examination without abnormal findings: Secondary | ICD-10-CM

## 2011-08-10 DIAGNOSIS — E785 Hyperlipidemia, unspecified: Secondary | ICD-10-CM

## 2011-08-10 LAB — BASIC METABOLIC PANEL
BUN: 15 mg/dL (ref 6–23)
Calcium: 8.9 mg/dL (ref 8.4–10.5)
Creatinine, Ser: 0.8 mg/dL (ref 0.4–1.2)
GFR: 74.21 mL/min (ref >60.00)
Glucose, Bld: 93 mg/dL (ref 70–99)

## 2011-08-10 LAB — LIPID PANEL
Cholesterol: 142 mg/dL (ref 0–200)
VLDL: 16.6 mg/dL (ref 0.0–40.0)

## 2011-08-10 LAB — CBC WITH DIFFERENTIAL/PLATELET
Basophils Absolute: 0 10*3/uL (ref 0.0–0.1)
HCT: 40 % (ref 36.0–46.0)
Lymphocytes Relative: 27.2 % (ref 12.0–46.0)
Lymphs Abs: 1.9 10*3/uL (ref 0.7–4.0)
Monocytes Relative: 6.6 % (ref 3.0–12.0)
Platelets: 305 10*3/uL (ref 150.0–400.0)
RDW: 13.3 % (ref 11.5–14.6)

## 2011-08-10 LAB — HEPATIC FUNCTION PANEL
ALT: 14 U/L (ref 0–35)
AST: 19 U/L (ref 0–37)
Total Bilirubin: 0.7 mg/dL (ref 0.3–1.2)

## 2011-08-10 NOTE — Patient Instructions (Signed)
Risk of premature heart attack or stroke increases as LDL or BAD cholesterol rises.Advanced cholesterol panels optimally determine risk base on particle composition ( NMR Lipoprofile ). Your LDL goal = < 130, ideally < 100.  Meds will be refilled at [facility] [address] after review of labs.   Eat a low-fat diet with lots of fruits and vegetables, up to 7-9 servings per day. Avoid obesity; your goal = waist less than 35 inches.Consume less than 30 grams of sugar per day from foods & drinks with High Fructose Corn Syrup as # 1,2,3 or #4 on label.

## 2011-08-10 NOTE — Progress Notes (Signed)
Subjective:    Patient ID: Toni [name], female    DOB: Aug 20, 1934, 77 y.o.   MRN: [id number]  HPI Medicare Wellness Visit:  The following psychosocial & medical history were reviewed as required by Medicare.   Social history: caffeine: 1 cup/ day , alcohol:  4  Glasses/ week ,  tobacco use : quit 1975  & exercise : none due to knee issues.   Home & personal  safety / fall risk: no issues, activities of daily living: no limitations , seatbelt use : yes , and smoke alarm employment : yes .  Power of Attorney/Living Will status : yes  Vision ( as recorded per Nurse) & Hearing  evaluation : wall chart read @ 6 ft; Ophth exam Spring 2012 : dry eyes , Restasis Rxed;whisper heard @ 6 ft . Orientation :oriented x 3 , memory & recall :normal, spelling  testing: normal,and mood & affect : normal . Depression / anxiety: denied  Travel history : 2009 [place] , immunization status :Shingles & Pneumovax  needed , transfusion history:  no, and preventive health surveillance ( colonoscopies, BMD , etc as per protocol/ SOC):colonoscopy  2012: polyps , Dental care:  Every 6 months . Chart reviewed &  Updated. Active issues reviewed & addressed.       Review of Systems #1 HYPERTENSION; Disease Monitoring: Blood pressure range-128-130/85  Chest pain, palpitations- no       Dyspnea- no Medications: Compliance- yes  Lightheadedness,Syncope- no    Edema- no  #2 HYPERLIPIDEMIA: Disease Monitoring: See symptoms for Hypertension Medications: Compliance- yes  Abd pain, bowel changes- Dr Loreta Ave treating her diarrhea with  Cholestyramine   Muscle aches- some, especially in legs    NWG:NFAOZHYQ/MVHQIO/NGEXBM- no;  Visual problems- no; Hypoglycemic symptoms- no Tiredness w/o weakness           Objective:   Physical Exam Gen.: Healthy and well-nourished in appearance. Alert, appropriate and cooperative throughout exam. Head: Normocephalic without obvious abnormalities Eyes: No corneal or  conjunctival inflammation noted.  Vision grossly normal with lenses. Ears: External  ear exam reveals no significant lesions or deformities. Canals clear .TMs normal. Hearing is grossly normal bilaterally. Nose: External nasal exam reveals no deformity or inflammation. Nasal mucosa are pink and moist. No lesions or exudates noted.  Mouth: Oral mucosa and oropharynx reveal no lesions or exudates. Teeth in good repair; upper & lower partials. Neck: No deformities, masses, or tenderness noted.  Thyroid normal. Lungs: Normal respiratory effort; chest expands symmetrically. Lungs are clear to auscultation without rales, wheezes, or increased work of breathing. Heart: Normal rate and rhythm. Normal S1 and S2. No gallop, click, or rub. S4 w/o  murmur. Abdomen: Bowel sounds normal; abdomen soft and nontender. No masses, organomegaly or hernias noted. Genitalia: S/P TAH & BSO   .                                                                                   Musculoskeletal/extremities: No deformity or scoliosis noted of  the thoracic or lumbar spine. No clubbing, cyanosis, edema  noted. Tone & strength  Normal.Joints: OA hand changes. Nail health  good. Vascular: Carotid, radial artery, dorsalis pedis  and  posterior tibial pulses are full and equal. No bruits present. Neurologic: Alert and oriented x3. Deep tendon reflexes symmetrical  But 0- 1/2 + @ knees       Skin: Intact without suspicious lesions or rashes. Lymph: No cervical, axillary lymphadenopathy present. Psych: Mood and affect are normal. Normally interactive                                                                                         Assessment & Plan:  #1 Medicare Wellness Exam; criteria met ; data entered #2 Problem List reviewed ; Assessment/ Recommendations made Plan: see Orders

## 2011-08-11 LAB — VITAMIN D 25 HYDROXY (VIT D DEFICIENCY, FRACTURES): Vit D, 25-Hydroxy: 33 ng/mL (ref 30–89)

## 2011-08-14 ENCOUNTER — Encounter: Payer: Self-pay | Admitting: *Deleted

## 2011-08-21 ENCOUNTER — Other Ambulatory Visit: Payer: Self-pay | Admitting: Internal Medicine

## 2011-08-23 ENCOUNTER — Telehealth: Payer: Self-pay | Admitting: *Deleted

## 2011-08-23 MED ORDER — METOPROLOL TARTRATE 25 MG PO TABS: 25.0000 mg | ORAL_TABLET | Freq: Every day | ORAL | Status: DC

## 2011-08-23 NOTE — Telephone Encounter (Signed)
[facility] and she is going out of town tomorrow and is completely out of this medication.  Metoprolol requested for refill on 08/21/2011, but Mavik was ordered for refill instead.  Rx Done. Patient Informed.

## 2011-08-31 ENCOUNTER — Other Ambulatory Visit: Payer: Self-pay | Admitting: Internal Medicine

## 2011-08-31 MED ORDER — LORAZEPAM 0.5 MG PO TABS: 0.5000 mg | ORAL_TABLET | Freq: Every evening | ORAL | Status: DC | PRN

## 2011-08-31 NOTE — Telephone Encounter (Signed)
RX sent

## 2011-09-18 ENCOUNTER — Ambulatory Visit (INDEPENDENT_AMBULATORY_CARE_PROVIDER_SITE_OTHER): Payer: Medicare Other

## 2011-09-18 DIAGNOSIS — Z23 Encounter for immunization: Secondary | ICD-10-CM

## 2011-09-25 LAB — COMPREHENSIVE METABOLIC PANEL
ALT: 24
Albumin: 3.8
Alkaline Phosphatase: 72
BUN: 15
Chloride: 102
Glucose, Bld: 85
Potassium: 4.1
Sodium: 138
Total Bilirubin: 0.8
Total Protein: 6.7

## 2011-09-25 LAB — CBC
HCT: 40.1
MCHC: 33.9
MCV: 90.1
Platelets: 309
RDW: 13.6

## 2011-09-25 LAB — DIFFERENTIAL
Eosinophils Absolute: 0.4
Lymphocytes Relative: 31
Monocytes Absolute: 0.5
Monocytes Relative: 6
Neutrophils Relative %: 58

## 2011-09-25 LAB — PROTIME-INR: INR: 0.9

## 2011-09-27 ENCOUNTER — Other Ambulatory Visit: Payer: Self-pay | Admitting: Internal Medicine

## 2011-10-16 ENCOUNTER — Other Ambulatory Visit: Payer: Self-pay

## 2011-10-16 MED ORDER — METOPROLOL TARTRATE 25 MG PO TABS: 25.0000 mg | ORAL_TABLET | Freq: Every day | ORAL | Status: DC

## 2011-10-16 MED ORDER — HYDROCHLOROTHIAZIDE 25 MG PO TABS: ORAL_TABLET | ORAL | Status: DC

## 2011-10-16 MED ORDER — CLONIDINE HCL 0.1 MG PO TABS: ORAL_TABLET | ORAL | Status: DC

## 2011-10-16 MED ORDER — TRANDOLAPRIL 4 MG PO TABS: ORAL_TABLET | ORAL | Status: DC

## 2011-10-16 MED ORDER — LORAZEPAM 0.5 MG PO TABS: 0.5000 mg | ORAL_TABLET | Freq: Every evening | ORAL | Status: DC | PRN

## 2011-10-16 NOTE — Telephone Encounter (Signed)
Per patient request, refill meds x 1 year. Ok'd by Dr.Hopper to fill all meds and give #90 on lorazepam

## 2011-10-29 ENCOUNTER — Ambulatory Visit (INDEPENDENT_AMBULATORY_CARE_PROVIDER_SITE_OTHER): Payer: Medicare Other | Admitting: Internal Medicine

## 2011-10-29 ENCOUNTER — Encounter: Payer: Self-pay | Admitting: Internal Medicine

## 2011-10-29 VITALS — BP 130/86 | HR 86 | Wt 184.0 lb

## 2011-10-29 DIAGNOSIS — I10 Essential (primary) hypertension: Secondary | ICD-10-CM

## 2011-10-29 MED ORDER — METOPROLOL TARTRATE 25 MG PO TABS: 25.0000 mg | ORAL_TABLET | Freq: Two times a day (BID) | ORAL | Status: DC

## 2011-10-29 NOTE — Progress Notes (Signed)
  Subjective:    Patient ID: Toni [name], female    DOB: [birth year], 77 y.o.   MRN: [id number]  HPI CHRONIC HYPERTENSION:concerns about meds; she is presently on clonidine 0.1 mg one half twice a day; hydrochlorothiazide 25 mg one half daily; metoprolol 25 mg daily and trandolapril 4 mg one half pill daily Disease Monitoring  Blood pressure : 132/75 on average  Chest pain: no   Dyspnea: no   Claudication: no   Medication compliance:   Medication Side Effects attributed to Clonidine: Fatigue; hypersomnulence; irritability; constipation  Lightheadedness: no, not significant   Urinary frequency: no   Edema: no      Preventitive Healthcare:  Exercise: yes, 3X/ week X 30 min   Diet Pattern: decreased portions  Salt Restriction:" minimal+"  added salt      Review of Systems     Objective:   Physical Exam she is healthy and well-nourished; she is in no distress  Chest is clear without increased work of breathing and rales at the bases  She is a regular rhythm with an S4 without significant murmur.  All pulses are intact without deficit  She has trace ankle edema at the sock line        Assessment & Plan:  #1 hypertension, well controlled  #2 subjective intolerance to clonidine. She is on a very low dose which can be discontinued without weaning. The metoprolol or more likely than not have to be increased to twice a day for optimal blood pressure control

## 2011-10-29 NOTE — Patient Instructions (Signed)
Blood Pressure Goal  Ideally is an AVERAGE < 135/85. This AVERAGE should be calculated from @ least 5-7 BP readings taken @ different times of day on different days of week. You should not respond to isolated BP readings , but rather the AVERAGE for that week  

## 2011-11-19 ENCOUNTER — Encounter: Payer: Self-pay | Admitting: Neurology

## 2011-11-19 ENCOUNTER — Ambulatory Visit (INDEPENDENT_AMBULATORY_CARE_PROVIDER_SITE_OTHER): Payer: Medicare Other | Admitting: Internal Medicine

## 2011-11-19 VITALS — BP 140/82 | HR 73 | Temp 98.5°F | Ht 64.5 in | Wt 183.0 lb

## 2011-11-19 DIAGNOSIS — I10 Essential (primary) hypertension: Secondary | ICD-10-CM

## 2011-11-19 DIAGNOSIS — R202 Paresthesia of skin: Secondary | ICD-10-CM

## 2011-11-19 DIAGNOSIS — R42 Dizziness and giddiness: Secondary | ICD-10-CM

## 2011-11-19 DIAGNOSIS — R209 Unspecified disturbances of skin sensation: Secondary | ICD-10-CM

## 2011-11-19 LAB — BASIC METABOLIC PANEL
BUN: 17 mg/dL (ref 6–23)
CO2: 25 mEq/L (ref 19–32)
Calcium: 9.4 mg/dL (ref 8.4–10.5)
Chloride: 102 mEq/L (ref 96–112)
Creatinine, Ser: 1 mg/dL (ref 0.4–1.2)
Glucose, Bld: 86 mg/dL (ref 70–99)

## 2011-11-19 MED ORDER — MECLIZINE HCL 12.5 MG PO TABS: 12.5000 mg | ORAL_TABLET | Freq: Three times a day (TID) | ORAL | Status: DC | PRN

## 2011-11-19 NOTE — Patient Instructions (Signed)
Please keep a diary of symptoms and signs. Enter significant symptoms, frequency, severity ( 1-10 scale), possible triggers, and response to medication, exercise or other therapeutic options as discussed. 

## 2011-11-19 NOTE — Progress Notes (Signed)
  Subjective:    Patient ID: Toni [name], female    DOB: 1934-08-27, 77 y.o.   MRN: [id number]  HPI  In late November she noticed some tingling of the occiput  associated with dizziness; her blood pressure average at that time was 155/91. She gradually increased of hydrochlorothiazide from 12.5 up to 25 mg and her Mavik up to 4 mg twice a day. Since that time her blood pressures average 134/83.  Despite improvement in her blood pressure the symptoms have failed to resolve. She questions whether this is related to positional change. Specifically she spends several hours bent over the bar in her kitchen reading the paper and performing other tasks.     Review of Systems  Dizziness Onset: X 12 mos or > ,initially  2-3 x/month Course:now nearly constant until afternoon ; it recurs next am within 60 min of arising Benign positional vertigo symptoms:no  Associated Factors: not related to straining or pain Cardiac prodrome:no palpitations, irregular rhythm, heart rate change Neurologic  No  headache,  weakness, change in coordination (gait/falling) Syncope:no Seizure activity:no Upper respiratory tract infection/extrinsic symptoms:no Frequency:now daily X 2-3 weeks Associated signs and symptoms: Visual change (blurred/double/loss):no Hearing loss/tinnitus:no but pressure nin ears Nausea/sweating:no Chest pain:no Dyspnea:no Treatment/response:only change in BP meds      Objective:   Physical Exam  Gen. appearance: Well-nourished, in no distress Eyes: Extraocular motion intact, field of vision normal, vision grossly intact with lenses, no nystagmus ENT: Canals clear, tympanic membranes normal, tuning fork exam abnormal with slight lateralization to R ear, hearing grossly normal Neck: Normal range of motion, no masses, normal thyroid Cardiovascular: Rate and rhythm normal; no murmurs, gallops or extra heart sounds Muscle skeletal: tone, &  strength normal Neuro:no cranial nerve  deficit, deep tendon  reflexes normal, gait normal. Romberg negative except for slight tremor with tenderness testing. Lymph: No cervical or axillary LA Skin: Warm and dry without suspicious lesions or rashes Psych: no anxiety or mood change. Normally interactive and cooperative.         Assessment & Plan:   #1 dizziness; possibly related to head and trunk positioning. She'll be asked to monitor this  #2 occipital paresthesias  #3 hypertension, improved control with increase in meds as noted  Plan: Check chemistries because of increase in HCTZ. Neurology referral.

## 2011-11-23 ENCOUNTER — Ambulatory Visit (INDEPENDENT_AMBULATORY_CARE_PROVIDER_SITE_OTHER): Payer: Medicare Other | Admitting: Neurology

## 2011-11-23 ENCOUNTER — Ambulatory Visit: Payer: Medicare Other | Admitting: Neurology

## 2011-11-23 ENCOUNTER — Encounter: Payer: Self-pay | Admitting: Neurology

## 2011-11-23 VITALS — BP 138/70 | HR 84 | Ht 64.0 in | Wt 183.0 lb

## 2011-11-23 DIAGNOSIS — R51 Headache: Secondary | ICD-10-CM

## 2011-11-23 MED ORDER — TIZANIDINE HCL 2 MG PO TABS: 2.0000 mg | ORAL_TABLET | Freq: Three times a day (TID) | ORAL | Status: AC | PRN

## 2011-11-23 NOTE — Progress Notes (Signed)
Dear Dr. Alwyn Ren,  Thank you for having me see Toni [name] in consultation today at [facility] for her problem with head pain and dizziness.  As you may recall, she is a 77 y.o. year old female with a history of  ocular migraines presents with a year history of a worsening occipital head tingling and pain. This is frequently accompanied by light headedness sometimes the extent where she feels she might "faint". 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 If she lies in a lounge chair it is better. It does remit after lying down if she supports her neck with a pillow. However , can take as long as 30 minutes.    she denies photophobia or phonophobia with head pain. She denies frank neck pain. It does get better with hot compresses on the neck.    she did not injure her neck before this began to happen. it is now happening almost every day. she noticed that lorazepam made it feel better.    she has never had neck or back surgery.    she has a long history of ocular migraines. These are without headache. She does get sinus headaches.  Past Medical History  Diagnosis Date  . PUD (peptic ulcer disease)     with h pylori  . Hepatic cyst   . Migraines   . Hypertension   . Hyperlipemia   . Colonic polyp   . Diverticulosis   . Helicobacter pylori gastritis 1987    Past Surgical History  Procedure Date  . Abdominal hysterectomy     BSO ; endometriosis; ? Appendectomy incidentally  . Cholecystectomy 2008    dr Loreta Ave  . Rotator cuff repair   . Colonoscopy 2003 & 2012    polyps  . Endoscopy gastritis 2008  . Tonsillectomy and adenoidectomy     History   Social History  . Marital Status: Married    Spouse Name: N/A    Number of Children: N/A  . Years of Education: N/A   Social History Main Topics  . Smoking status: Former Smoker    Quit date: 12/10/1973  . Smokeless tobacco: Never Used  . Alcohol Use: Yes      socially  .  Drug Use: None  . Sexually Active: None   Other Topics Concern  . None   Social History Narrative  . None    Family History  Problem Relation Age of Onset  . Asthma Brother   . Hypertension Father   . Heart attack Father 61  . Leukemia Mother   . Stomach cancer Maternal Aunt   . Breast cancer Sister   . Thyroid disease Sister     Current Outpatient Prescriptions on File Prior to Visit  Medication Sig Dispense Refill  . hydrochlorothiazide (HYDRODIURIL) 25 MG tablet TAKE 1 TABLET EVERY DAY  90 tablet  3  . LORazepam (ATIVAN) 0.5 MG tablet Take 1 tablet (0.5 mg total) by mouth at bedtime as needed.  90 tablet  0  . metoprolol tartrate (LOPRESSOR) 25 MG tablet Take 1 tablet (25 mg total) by mouth 2 (two) times daily.  180 tablet  3  . simvastatin (ZOCOR) 40 MG tablet Take 40 mg by mouth at bedtime.        . trandolapril (MAVIK) 4 MG tablet TAKE 1 TABLET BY MOUTH EVERY DAY  90 tablet  3  . meclizine (ANTIVERT) 12.5 MG tablet Take 1 tablet (12.5  mg total) by mouth 3 (three) times daily as needed for dizziness or nausea.  30 tablet  0    No Known Allergies    ROS:  13 systems were reviewed and are notable for bilateral knee pain.  All other review of systems are unremarkable.   Examination:  Filed Vitals:   11/23/11 1401  BP: 138/70  Pulse: 84  Height: 5\' 4"  (1.626 m)  Weight: 183 lb (83.008 kg)     In general, well appearing older women.  H&N: some point tenderness on the right trapezius.  Some mild  occipital notch tenderness.  Reduced ROM.  Cardiovascular: The patient has a regular rate and rhythm and no carotid bruits.  Fundoscopy:  Disks are flat. Vessel caliber within normal limits.  Mental status:   The patient is oriented to person, place and time. Recent and remote memory are intact. Attention span and concentration are normal. Language including repetition, naming, following commands are intact. Fund of knowledge of current and historical events, as well  as vocabulary are normal.  Cranial Nerves: Pupils are equally round and reactive to light. Visual fields full to confrontation. Extraocular movements are intact without nystagmus. Facial sensation and muscles of mastication are intact. Muscles of facial expression are symmetric. Hearing intact to bilateral finger rub. Tongue protrusion, uvula, palate midline.  Shoulder shrug intact  Motor:  The patient has normal bulk and tone, no pronator drift.  There are no adventitious movements.  5/5  Reflexes:   Biceps  Triceps Brachioradialis Knee Ankle  Right 2+  2+  2+   2+ 0  Left  2+  2+  2+   1+ 0  Toes down  Coordination:  Normal finger to nose.  No dysdiadokinesia.  Sensation is decreased to temperature and vibration in her lower extremities.  Gait and Station are normal.  Tandem gait is mildly impaired.  Romberg is negative   Impression/Recs: Likely cervicogenic headache/dizziness. I think orthostatic headache due to intracranial hypotension is much less likely as the pain only goes away if she supports her neck.   I am going to try tizanidine as a muscle relaxant to see if this helps.  She may also respond to occipital nerve block/trigger point injections.  She is going to call me if the tizanidine does not help and then we can consider increasing the dose before trying other interventions.   We will see the patient back in 6 weeks.  Thank you for having [place] see Toni [name] in consultation.  Feel free to contact me with any questions.  Lupita Raider Modesto Charon, MD [facility], [place] [address] [place], [place] [address] Phone: [phone] Fax: [phone].

## 2011-11-28 ENCOUNTER — Other Ambulatory Visit: Payer: Self-pay | Admitting: Internal Medicine

## 2012-01-22 ENCOUNTER — Encounter: Payer: Self-pay | Admitting: Neurology

## 2012-01-22 ENCOUNTER — Ambulatory Visit (INDEPENDENT_AMBULATORY_CARE_PROVIDER_SITE_OTHER): Payer: Medicare Other | Admitting: Neurology

## 2012-01-22 ENCOUNTER — Telehealth: Payer: Self-pay | Admitting: *Deleted

## 2012-01-22 VITALS — BP 122/76 | HR 72 | Ht 64.0 in | Wt 184.0 lb

## 2012-01-22 DIAGNOSIS — R51 Headache: Secondary | ICD-10-CM

## 2012-01-22 MED ORDER — TRANDOLAPRIL 4 MG PO TABS: ORAL_TABLET | ORAL | Status: DC

## 2012-01-22 MED ORDER — TIZANIDINE HCL 4 MG PO TABS: 4.0000 mg | ORAL_TABLET | Freq: Three times a day (TID) | ORAL | Status: AC | PRN

## 2012-01-22 NOTE — Progress Notes (Signed)
Dear Dr. Alwyn Ren,  I saw  Toni [name] back in [place] [facility] clinic for her problem with probably cervicogenic headache.  As you may recall, she is a 78 y.o. year old female with a history of ocular migraines who has had a year history of occipital head tingling and pain.  At times these are accompanied by light headedness where the patient feels faint.  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  I gave her tizanidine for the head pains.  She also adjusted the way she read by elevating her book at the bar.  She change pillows to a thinner pillow.  All this has helped.  She has only had 4 severe headaches and all were relieved with the tizanidine.  Medical history, social history, and family history were reviewed and have not changed since the last clinic visit.  Current Outpatient Prescriptions on File Prior to Visit  Medication Sig Dispense Refill  . cycloSPORINE (RESTASIS) 0.05 % ophthalmic emulsion 1 drop 2 (two) times daily.        . hydrochlorothiazide (HYDRODIURIL) 25 MG tablet TAKE 1 TABLET EVERY DAY  90 tablet  3  . LORazepam (ATIVAN) 0.5 MG tablet Take 1 tablet (0.5 mg total) by mouth at bedtime as needed.  90 tablet  0  . metoprolol tartrate (LOPRESSOR) 25 MG tablet Take 1 tablet (25 mg total) by mouth 2 (two) times daily.  180 tablet  3  . simvastatin (ZOCOR) 40 MG tablet 1 AT BEDTIME  90 tablet  1  . trandolapril (MAVIK) 4 MG tablet TAKE 1 TABLET BY MOUTH EVERY DAY  90 tablet  3  tizanidine 4mg  prn headache  No Known Allergies  ROS:  13 systems were unremarkable.  Exam: . Filed Vitals:   01/22/12 1125  BP: 122/76  Pulse: 72  Height: 5\' 4"  (1.626 m)  Weight: 184 lb (83.462 kg)    In general, well appearing women.  Mental status:   The patient is oriented to person, place and time. Recent and remote memory are intact. Attention span and concentration are  normal. Language including repetition, naming, following commands are intact. Fund of knowledge of current and historical events, as well as vocabulary are normal.   Motor:  Normal bulk and tone, no drift and 5/5 muscle strength bilaterally.  Reflexes:  2+ thoughout UE, absent lower extremities   Gait:  Normal gait and station.  Impression/Recommendations: Cervicogenic headache improved.  Tizanidine effective as abortive  I have refilled her tizanidine and she will return to me on a prn basis.  Lupita Raider Modesto Charon, MD [facility], [place]

## 2012-01-22 NOTE — Telephone Encounter (Signed)
This should be once daily only. If blood pressure is not well controlled she should return to change medications.

## 2012-01-22 NOTE — Telephone Encounter (Signed)
Pt states that she was advised to take trandolapril (MAVIK) 4 MG tablet  Bid however it states qd on med list please verify how Pt is to take med. New Rx if needed [facility] [address]

## 2012-01-22 NOTE — Telephone Encounter (Signed)
Discuss with patient, Rx sent. 

## 2012-03-24 DIAGNOSIS — L820 Inflamed seborrheic keratosis: Secondary | ICD-10-CM | POA: Diagnosis not present

## 2012-06-14 ENCOUNTER — Other Ambulatory Visit: Payer: Self-pay | Admitting: Internal Medicine

## 2012-06-16 NOTE — Telephone Encounter (Signed)
Refill done.  

## 2012-07-24 ENCOUNTER — Other Ambulatory Visit: Payer: Self-pay | Admitting: Internal Medicine

## 2012-07-25 NOTE — Telephone Encounter (Signed)
Patient needs to schedule a CPX for December

## 2012-09-25 ENCOUNTER — Ambulatory Visit (INDEPENDENT_AMBULATORY_CARE_PROVIDER_SITE_OTHER): Payer: Medicare Other

## 2012-09-25 DIAGNOSIS — Z23 Encounter for immunization: Secondary | ICD-10-CM

## 2012-10-14 DIAGNOSIS — R197 Diarrhea, unspecified: Secondary | ICD-10-CM | POA: Diagnosis not present

## 2012-10-14 DIAGNOSIS — E669 Obesity, unspecified: Secondary | ICD-10-CM | POA: Diagnosis not present

## 2012-10-14 DIAGNOSIS — K219 Gastro-esophageal reflux disease without esophagitis: Secondary | ICD-10-CM | POA: Diagnosis not present

## 2012-10-30 ENCOUNTER — Other Ambulatory Visit: Payer: Self-pay | Admitting: Internal Medicine

## 2012-10-30 NOTE — Telephone Encounter (Signed)
Rx sent.    MW 

## 2012-11-05 ENCOUNTER — Ambulatory Visit (INDEPENDENT_AMBULATORY_CARE_PROVIDER_SITE_OTHER): Payer: Medicare Other | Admitting: Internal Medicine

## 2012-11-05 ENCOUNTER — Encounter: Payer: Self-pay | Admitting: Internal Medicine

## 2012-11-05 VITALS — BP 118/76 | HR 55 | Resp 14 | Wt 188.0 lb

## 2012-11-05 DIAGNOSIS — Z23 Encounter for immunization: Secondary | ICD-10-CM | POA: Diagnosis not present

## 2012-11-05 DIAGNOSIS — I10 Essential (primary) hypertension: Secondary | ICD-10-CM

## 2012-11-05 DIAGNOSIS — E785 Hyperlipidemia, unspecified: Secondary | ICD-10-CM | POA: Diagnosis not present

## 2012-11-05 DIAGNOSIS — Z860100 Personal history of colon polyps, unspecified: Secondary | ICD-10-CM

## 2012-11-05 DIAGNOSIS — R7309 Other abnormal glucose: Secondary | ICD-10-CM | POA: Diagnosis not present

## 2012-11-05 LAB — HEPATIC FUNCTION PANEL
ALT: 19 U/L (ref 0–35)
AST: 18 U/L (ref 0–37)
Alkaline Phosphatase: 74 U/L (ref 39–117)
Bilirubin, Direct: 0.1 mg/dL (ref 0.0–0.3)
Total Bilirubin: 0.8 mg/dL (ref 0.3–1.2)
Total Protein: 7.1 g/dL (ref 6.0–8.3)

## 2012-11-05 LAB — CBC WITH DIFFERENTIAL/PLATELET
Eosinophils Absolute: 0.3 10*3/uL (ref 0.0–0.7)
Eosinophils Relative: 3.3 % (ref 0.0–5.0)
HCT: 42 % (ref 36.0–46.0)
Lymphs Abs: 2.4 10*3/uL (ref 0.7–4.0)
MCHC: 33.5 g/dL (ref 30.0–36.0)
MCV: 90.9 fl (ref 78.0–100.0)
Monocytes Absolute: 0.5 10*3/uL (ref 0.1–1.0)
Platelets: 309 10*3/uL (ref 150.0–400.0)
WBC: 7.9 10*3/uL (ref 4.5–10.5)

## 2012-11-05 LAB — BASIC METABOLIC PANEL
BUN: 14 mg/dL (ref 6–23)
Calcium: 9.4 mg/dL (ref 8.4–10.5)
Creatinine, Ser: 0.9 mg/dL (ref 0.4–1.2)
GFR: 62.95 mL/min (ref >60.00)
Glucose, Bld: 93 mg/dL (ref 70–99)

## 2012-11-05 LAB — LIPID PANEL
Cholesterol: 190 mg/dL (ref 0–200)
VLDL: 27.2 mg/dL (ref 0.0–40.0)

## 2012-11-05 MED ORDER — METOPROLOL TARTRATE 25 MG PO TABS: 25.0000 mg | ORAL_TABLET | Freq: Two times a day (BID) | ORAL | Status: DC

## 2012-11-05 MED ORDER — HYDROCHLOROTHIAZIDE 25 MG PO TABS: ORAL_TABLET | ORAL | Status: DC

## 2012-11-05 MED ORDER — TRANDOLAPRIL 4 MG PO TABS: 4.0000 mg | ORAL_TABLET | Freq: Every day | ORAL | Status: DC

## 2012-11-05 MED ORDER — LORAZEPAM 0.5 MG PO TABS: 0.5000 mg | ORAL_TABLET | Freq: Every evening | ORAL | Status: DC | PRN

## 2012-11-05 MED ORDER — TRAMADOL HCL 50 MG PO TABS: ORAL_TABLET | ORAL | Status: DC

## 2012-11-05 MED ORDER — SIMVASTATIN 40 MG PO TABS: 20.0000 mg | ORAL_TABLET | Freq: Every day | ORAL | Status: DC

## 2012-11-05 NOTE — Progress Notes (Signed)
Subjective:    Patient ID: Toni [name], female    DOB: 05/04/1934, 78 y.o.   MRN: [id number]  HPI The patient is here for followup of  hyperlipidemia,  Hypertension , & fasting hyperglycemia.  The most recent FBS 11/2011  was 86 . No glucose monitoring  . No hypoglycemia Last ophthalmologic examination > 12 mos   revealed no retinopathy. No active podiatry assessment on record. Diet is  Non specific . Exercise reduced due to arthralgias .  The most recent lipids 07/2011  revealed  LDL 62  , HDL 63.5  , and triglycerides 83   . There is medical compliance with the statin. She is also on Cholestryramine (not on Med List)  Blood pressure range  is 130-135/78-83  . There is medical compliance with antihypertensive medications          Review of Systems  Constitutional: No fever, chills, significant weight change, fatigue,or  weakness . Improvement in night sweats Eyes: No  blurred vision, double vision, or loss of vision Cardiovascular: no chest pain, palpitations, racing, irregular rhythm,syncope,nausea,sweating, claudication, or edema  Respiratory: No exertinal dyspnea, paroxysmal nocturnal dyspnea Gastrointestinal: No heartburn,dysphagia, diarrhea, significant constipation, rectal bleeding, melena Genitourinary: No dysuria,hematuria, pyuria, frequency,  incontinence, nocturia Musculoskeletal: No myalgias or muscle cramping , weakness, or cyanosis Dermatologic: No rash, pruritus, urticaria, or change in color or temperature of skin Neurologic: No  limb weakness,  numbness or tingling Endocrine: No change in hair/skin/ nails, excessive thirst, excessive hunger, excessive urination, or unexplained fatigue        Objective:   Physical Exam        Gen.:  well-nourished in appearance. Alert, appropriate and cooperative throughout exam.  Eyes: No corneal or conjunctival inflammation noted.  Extraocular motion intact.  Nose: External nasal exam reveals no deformity or  inflammation. Nasal mucosa are pink and moist. No lesions or exudates noted.  Mouth: Oral mucosa and oropharynx reveal no lesions or exudates. Teeth in good repair. Neck: No deformities, masses, or tenderness noted. Range of motion & Thyroid normal. Lungs: Normal respiratory effort; chest expands symmetrically. Lungs are clear to auscultation without rales, wheezes, or increased work of breathing. Heart: Normal rate and rhythm. Normal S1 and S2. No gallop, click, or rub. No murmur.S4 with slurring at  LSB  Abdomen: Bowel sounds normal; abdomen soft and nontender. No masses, organomegaly or hernias noted. Musculoskeletal/extremities: No clubbing, cyanosis,or  edema noted. DIP osteoarthritic finger changes Tone & strength  normal.Joints normal. Nail health  good. Vascular: Carotid, radial artery, dorsalis pedis and  posterior tibial pulses are full and equal. No bruits present. Neurologic: Alert and oriented x3. Deep tendon reflexes symmetrical and normal.          Skin: Intact without suspicious lesions or rashes.Small flesh colored nevus of nose Lymph: No cervical, axillary lymphadenopathy present. Psych: Mood and affect are normal. Normally interactive  Assessment & Plan:   See problem list with assessments  #2 arthralgias, multiple sites  #3 subjective hair loss attributed to beta blocker  #4 nevus, ? Uncertain character  Plan: See orders and recommendations

## 2012-11-05 NOTE — Patient Instructions (Addendum)
Please see your Dermatologist Blood Pressure Goal  Ideally is an AVERAGE < 135/85. This AVERAGE should be calculated from @ least 5-7 BP readings taken @ different times of day on different days of week. You should not respond to isolated BP readings , but rather the AVERAGE for that week  If you activate My Chart; the results can be released to you as soon as they populate from the lab. If you choose not to use this program; the labs have to be reviewed, copied & mailed   causing a delay in getting the results to you.

## 2012-11-24 ENCOUNTER — Encounter: Payer: Self-pay | Admitting: Internal Medicine

## 2012-12-08 DIAGNOSIS — L578 Other skin changes due to chronic exposure to nonionizing radiation: Secondary | ICD-10-CM | POA: Diagnosis not present

## 2012-12-08 DIAGNOSIS — L820 Inflamed seborrheic keratosis: Secondary | ICD-10-CM | POA: Diagnosis not present

## 2013-01-26 DIAGNOSIS — H04129 Dry eye syndrome of unspecified lacrimal gland: Secondary | ICD-10-CM | POA: Diagnosis not present

## 2013-01-26 DIAGNOSIS — Z961 Presence of intraocular lens: Secondary | ICD-10-CM | POA: Diagnosis not present

## 2013-09-02 ENCOUNTER — Ambulatory Visit: Payer: Medicare Other

## 2013-09-23 ENCOUNTER — Ambulatory Visit (INDEPENDENT_AMBULATORY_CARE_PROVIDER_SITE_OTHER): Payer: Medicare Other

## 2013-09-23 DIAGNOSIS — Z23 Encounter for immunization: Secondary | ICD-10-CM | POA: Diagnosis not present

## 2013-10-13 DIAGNOSIS — K219 Gastro-esophageal reflux disease without esophagitis: Secondary | ICD-10-CM | POA: Diagnosis not present

## 2013-10-13 DIAGNOSIS — R197 Diarrhea, unspecified: Secondary | ICD-10-CM | POA: Diagnosis not present

## 2013-10-13 DIAGNOSIS — R1032 Left lower quadrant pain: Secondary | ICD-10-CM | POA: Diagnosis not present

## 2013-10-13 DIAGNOSIS — K5730 Diverticulosis of large intestine without perforation or abscess without bleeding: Secondary | ICD-10-CM | POA: Diagnosis not present

## 2013-11-02 ENCOUNTER — Ambulatory Visit (INDEPENDENT_AMBULATORY_CARE_PROVIDER_SITE_OTHER): Payer: Medicare Other | Admitting: Internal Medicine

## 2013-11-02 ENCOUNTER — Encounter: Payer: Self-pay | Admitting: Internal Medicine

## 2013-11-02 VITALS — BP 173/78 | HR 72 | Temp 98.0°F | Resp 16 | Wt 183.0 lb

## 2013-11-02 DIAGNOSIS — J310 Chronic rhinitis: Secondary | ICD-10-CM

## 2013-11-02 DIAGNOSIS — R51 Headache: Secondary | ICD-10-CM | POA: Diagnosis not present

## 2013-11-02 DIAGNOSIS — I951 Orthostatic hypotension: Secondary | ICD-10-CM | POA: Diagnosis not present

## 2013-11-02 DIAGNOSIS — I10 Essential (primary) hypertension: Secondary | ICD-10-CM

## 2013-11-02 LAB — BASIC METABOLIC PANEL
BUN: 9 mg/dL (ref 6–23)
Calcium: 9.5 mg/dL (ref 8.4–10.5)
GFR: 62.01 mL/min (ref >60.00)
Glucose, Bld: 96 mg/dL (ref 70–99)
Sodium: 137 mEq/L (ref 135–145)

## 2013-11-02 MED ORDER — HYDRALAZINE HCL 25 MG PO TABS: 25.0000 mg | ORAL_TABLET | Freq: Three times a day (TID) | ORAL | Status: DC

## 2013-11-02 NOTE — Patient Instructions (Signed)
Minimal Blood Pressure Goal= AVERAGE < 140/90;  Ideal is an AVERAGE < 135/85. This AVERAGE should be calculated from @ least 5-7 BP readings taken @ different times of day on different days of week. You should not respond to isolated BP readings , but rather the AVERAGE for that week .Please bring your  blood pressure cuff to office visits to verify that it is reliable.It  can also be checked against the blood pressure device at the pharmacy. Finger or wrist cuffs are not dependable; an arm cuff is. Perform isometric exercise of calves  ( while seated go up on toes to count of 5 & then onto heels for 5 count). Repeat  4- 5 times prior to standing if you've been seated or supine for any significant period of time as BP drops with such positions. Plain Mucinex (NOT D) for thick secretions ;force NON dairy fluids .   Nasal cleansing in the shower as discussed with lather of mild shampoo.After 10 seconds wash off lather while  exhaling through nostrils. Make sure that all residual soap is removed to prevent irritation.  Nasacort AQ OTC  1 spray in each nostril twice a day as needed. Use the "crossover" technique into opposite nostril spraying toward opposite ear @ 45 degree angle, not straight up into nostril.  Use a Neti pot daily only  as needed for significant sinus congestion; going from open side to congested side . Plain Allegra (NOT D )  160 daily , Loratidine 10 mg , OR Zyrtec 10 mg @ bedtime  as needed for itchy eyes & sneezing.

## 2013-11-02 NOTE — Progress Notes (Signed)
  Subjective:    Patient ID: Toni [name], female    DOB: 05/03/1934, 79 y.o.   MRN: [id number]  HPI   Since October she's had significant elevation of her blood pressure. Blood pressure has ranged from 136-198/86-113 associated with some postural dizziness. She denies any true vertigo or benign positional vertigo symptoms  She denies any cardiac or neurologic prodrome prior to the dizziness. She has had recent rhinitis symptoms for which she's been taking Coricidin HP.    Review of Systems  Specifically she denies a prodrome of headache, limb weakness, numbness, or tingling. She also has no cardiac prodrome of change and regular rhythm.  She does have cervicogenic pain in her head and neck for which she takes a muscle relaxant prescribed by Dr. Modesto Charon, neurologist.  She denies signs of rhinosinusitis such as frontal headache, facial pain, nasal purulence,  feveror sore throat,.       Objective:   Physical Exam  Appears healthy and well-nourished & in no acute distress  Otic canals are clear with no evidence of tympanic membrane inflammation.  Nares patent with no exudate  Oropharynx unremarkable. Upper  & lower partial     No carotid bruits are present.No neck pain distention present at 10 - 15 degrees. Thyroid normal to palpation  Heart rhythm and rate are normal with no significant murmurs or gallops. S2 slurred  Chest is clear with no increased work of breathing. Decreased BS  There is no evidence of aortic aneurysm or renal artery bruits  Abdomen soft with no organomegaly or masses. No HJR  No clubbing or cyanosis . Trace sock line  edema present.  Pedal pulses are intact   No ischemic skin changes are present . Nails healthy   Alert and oriented. Strength, tone, DTRs reflexes equal but 0+ @ knees          Assessment & Plan:  #1 HTN , uncontrolled  #2 hypotension , especially postural #3 rhinitis See oders

## 2013-11-09 ENCOUNTER — Ambulatory Visit (INDEPENDENT_AMBULATORY_CARE_PROVIDER_SITE_OTHER): Payer: Medicare Other | Admitting: Internal Medicine

## 2013-11-09 ENCOUNTER — Encounter: Payer: Self-pay | Admitting: Internal Medicine

## 2013-11-09 VITALS — BP 162/81 | HR 77 | Temp 99.0°F | Resp 15 | Ht 64.25 in | Wt 183.4 lb

## 2013-11-09 DIAGNOSIS — R51 Headache: Secondary | ICD-10-CM

## 2013-11-09 DIAGNOSIS — I499 Cardiac arrhythmia, unspecified: Secondary | ICD-10-CM | POA: Diagnosis not present

## 2013-11-09 DIAGNOSIS — R7309 Other abnormal glucose: Secondary | ICD-10-CM

## 2013-11-09 DIAGNOSIS — I10 Essential (primary) hypertension: Secondary | ICD-10-CM

## 2013-11-09 LAB — T4, FREE: Free T4: 1.05 ng/dL (ref 0.60–1.60)

## 2013-11-09 LAB — HEMOGLOBIN A1C: Hgb A1c MFr Bld: 5.5 % (ref 4.6–6.5)

## 2013-11-09 LAB — ALDOSTERONE + RENIN ACTIVITY W/ RATIO: PRA LC/MS/MS: 0.62 ng/mL/h (ref 0.25–5.82)

## 2013-11-09 LAB — MAGNESIUM: Magnesium: 2.1 mg/dL (ref 1.5–2.5)

## 2013-11-09 NOTE — Assessment & Plan Note (Signed)
EKG TFTs

## 2013-11-09 NOTE — Progress Notes (Signed)
   Subjective:    Patient ID: Toni [name], female    DOB: [birth year], 79 y.o.   MRN: [id number]  HPI  Blood pressure range  102/62- 186/101, no average  Compliant with anti hypertemsive medication. Some lightheadedness intermittently , 4-5 X/ day even  @ rest. No other adverse medication effect described.        Review of Systems Significant headaches diffusely 3-4 X/ day for 30 minutes in context of HTN. Described as "pins & needles" up to a level 8. Headache treated with Zanaflex with benefit. No epistaxis, chest pain,  exertional dyspnea, claudication, paroxysmal nocturnal dyspnea, or edema absent. Intermittent fluttering 2-3 X /month for 2-3 minutes She denies a constellation of headaches, flushing, chest pain, and diarrhea. Lorazepam controls anxiety      Objective:   Physical Exam Gen.:  well-nourished in appearance. Alert, appropriate and cooperative throughout exam.  Head: Normocephalic without obvious abnormalities Eyes: No corneal or conjunctival inflammation noted. Pupils equal round reactive to light and accommodation. Extraocular motion intact. FOV normal. No nystagmus Ears: External  ear exam reveals no significant lesions or deformities. Canals clear .TMs normal.  Nose: External nasal exam reveals no deformity or inflammation. Nasal mucosa are pink and moist. No lesions or exudates noted.   Mouth: Oral mucosa and oropharynx reveal no lesions or exudates. Teeth in good repair. Neck: No deformities, masses, or tenderness noted. Range of motion & Thyroid normal. Lungs: Normal respiratory effort; chest expands symmetrically. Lungs are clear to auscultation without rales, wheezes, or increased work of breathing. Heart: Normal rate and rhythm. Normal S1 and S2. No gallop, click, or rub. No murmur.S4 with slurring at  LSB Abdomen: Bowel sounds normal; abdomen soft and nontender. No masses, organomegaly or hernias noted.                                     Musculoskeletal/extremities:   Slightlyccentuated curvature of upper thoracic spine. . No clubbing, cyanosis,  or significant extremity  deformity noted. Trace edema @ sock line .Tone & strength normal. Hand joints normal .Able to lie down & sit up w/o help. Negative SLR bilaterally Vascular: Carotid, radial artery, dorsalis pedis and  posterior tibial pulses are full and equal. No bruits present.No AAA Neurologic: Alert and oriented x3. Deep tendon reflexes symmetrical and normal.  Gait normal  . Rhomberg & finger to nose . No cranial nerve deficit      Skin: Intact without suspicious lesions or rashes. Lymph: No cervical, axillary lymphadenopathy present. Psych: Mood and affect : slightly anxious. Normally interactive  . History tends to change as she tries to define her symptoms                                                                                      Assessment & Plan:  See Current Assessment & Plan in Problem List under specific Diagnosis

## 2013-11-09 NOTE — Patient Instructions (Signed)
Average BP every 8 hrs. To prevent palpitations or premature beats, avoid stimulants such as decongestants, diet pills, nicotine, or caffeine (coffee, tea, cola, or chocolate) to excess. Please keep a diary of your headaches . Document  each occurrence on the calendar with notation of : #1 any prodrome ( any non headache symptom such as marked fatigue,visual changes, ,etc ) which precedes actual headache ; #2) severity on 1-10 scale; #3) any triggers ( food/ drink,enviromenntal or weather changes ,physical or emotional stress) in 8-12 hour period prior to the headache; & #4) response to any medications or other intervention. Please review "Headache" @ [facility] for additional information.   Your next office appointment will be determined based upon review of your BP & headache diaries. Those instructions will be transmitted to you through My Chart .  Please report any significant change in your symptoms.

## 2013-11-09 NOTE — Progress Notes (Signed)
Pre visit review using our clinic review tool, if applicable. No additional management support is needed unless otherwise documented below in the visit note. 

## 2013-11-09 NOTE — Assessment & Plan Note (Signed)
Renin/ aldosterone pending Treating average stressed

## 2013-11-09 NOTE — Assessment & Plan Note (Signed)
To keep a diary of your headaches . Document  each occurrence on the calendar with notation of : #1 any prodrome ( any non headache symptom such as marked fatigue,visual changes, ,etc ) which precedes actual headache ; #2) severity on 1-10 scale; #3) any triggers ( food/ drink,enviromenntal or weather changes ,physical or emotional stress) in 8-12 hour period prior to the headache; & #4) response to any medications or other intervention. Please review "Headache" @ [facility] for additional information.   Pheo studies

## 2013-11-11 ENCOUNTER — Other Ambulatory Visit: Payer: Self-pay | Admitting: Internal Medicine

## 2013-11-11 NOTE — Telephone Encounter (Signed)
Hydralazine refilled per protocol 

## 2013-11-16 LAB — METANEPHRINES, URINE, 24 HOUR
Metaneph Total, Ur: 464 mcg/24 h (ref 224–832)
Normetanephrine, 24H Ur: 423 mcg/24 h (ref 122–676)

## 2013-11-17 ENCOUNTER — Encounter: Payer: Self-pay | Admitting: Internal Medicine

## 2013-11-17 LAB — CATECHOLAMINES, FRACTIONATED, URINE, 24 HOUR
Calculated Total (E+NE): 42 mcg/24 h (ref 26–121)
Creatinine, Urine mg/day-CATEUR: 0.98 g/(24.h) (ref 0.63–2.50)
Norepinephrine, 24 hr Ur: 42 mcg/24 h (ref 15–100)
Total Volume - CF 24Hr U: 2300 mL

## 2013-11-23 ENCOUNTER — Ambulatory Visit (INDEPENDENT_AMBULATORY_CARE_PROVIDER_SITE_OTHER): Payer: Medicare Other | Admitting: Internal Medicine

## 2013-11-23 ENCOUNTER — Encounter: Payer: Self-pay | Admitting: Internal Medicine

## 2013-11-23 VITALS — BP 158/76 | HR 79 | Temp 98.4°F | Ht 64.25 in | Wt 179.8 lb

## 2013-11-23 DIAGNOSIS — E785 Hyperlipidemia, unspecified: Secondary | ICD-10-CM | POA: Diagnosis not present

## 2013-11-23 DIAGNOSIS — I10 Essential (primary) hypertension: Secondary | ICD-10-CM | POA: Diagnosis not present

## 2013-11-23 DIAGNOSIS — R51 Headache: Secondary | ICD-10-CM

## 2013-11-23 MED ORDER — CHOLESTYRAMINE 4 GM/DOSE PO POWD: 1.0000 g | Freq: Every day | ORAL | Status: DC

## 2013-11-23 MED ORDER — METOPROLOL TARTRATE 25 MG PO TABS: 25.0000 mg | ORAL_TABLET | Freq: Two times a day (BID) | ORAL | Status: DC

## 2013-11-23 MED ORDER — TRANDOLAPRIL 4 MG PO TABS: 4.0000 mg | ORAL_TABLET | Freq: Every day | ORAL | Status: DC

## 2013-11-23 MED ORDER — HYDRALAZINE HCL 25 MG PO TABS: 25.0000 mg | ORAL_TABLET | Freq: Three times a day (TID) | ORAL | Status: DC

## 2013-11-23 MED ORDER — TIZANIDINE HCL 2 MG PO TABS: 2.0000 mg | ORAL_TABLET | Freq: Three times a day (TID) | ORAL | Status: DC | PRN

## 2013-11-23 MED ORDER — HYDROCHLOROTHIAZIDE 25 MG PO TABS: 12.5000 mg | ORAL_TABLET | Freq: Every day | ORAL | Status: DC

## 2013-11-23 MED ORDER — SERTRALINE HCL 25 MG PO TABS: 25.0000 mg | ORAL_TABLET | Freq: Every day | ORAL | Status: DC

## 2013-11-23 NOTE — Assessment & Plan Note (Signed)
Monitor response to SSRI

## 2013-11-23 NOTE — Assessment & Plan Note (Signed)
Lipid labs

## 2013-11-23 NOTE — Progress Notes (Signed)
Pre visit review using our clinic review tool, if applicable. No additional management support is needed unless otherwise documented below in the visit note. 

## 2013-11-23 NOTE — Patient Instructions (Signed)
Based on pathophysiology of neurotransmitter deficiency as we discussed ; decide if this is option for you. Minimal Blood Pressure Goal= AVERAGE < 140/90;  Ideal is an AVERAGE < 135/85. This AVERAGE should be calculated from @ least 5-7 BP readings taken @ different times of day on different days of week. You should not respond to isolated BP readings , but rather the AVERAGE for that week .Please bring your  blood pressure cuff to office visits to verify that it is reliable.It  can also be checked against the blood pressure device at the pharmacy. Finger or wrist cuffs are not dependable; an arm cuff is.

## 2013-11-23 NOTE — Progress Notes (Signed)
   Subjective:    Patient ID: Toni [name], female    DOB: 27-Jul-1934, 79 y.o.   MRN: [id number]  HPI   Her blood pressure average over 7 days is 147/83. Based on a high of 155/94 and a low of 135 / 76 the average is 145/85.  She continues to have some headaches. She relates this to being frustrated with her husband and other stresses. There is no emotional abuse at home.  She denies a constellation of headache,sweating &  tachycardia; but she does have paroxysmal spikes of her blood pressure, rarely up to 200. Her catecholamines and metanephrines were normal.  Renin/aldosterone levels were also negative  She also denies constellation of flushing and diarrhea.   Review of Systems  She does describe tingling of the entire scalp from posterior to anterior scalp line which can last 15 minutes to an hour. These occur 2-3 X / day.Generic Zanaflex & Lorazepam are of some benefit. She has a history of ocular migraines.  She has had no blurred vision, diplopia, or vision loss. The headache symptoms are not associated with excess tearing or sensitivity to light or sound.  She's had no tinnitus or some hearing loss  There no associated vertigo or balance issues.  She has no numbness, tingling, weakness of extremities       Objective:   Physical Exam Appears healthy and well-nourished & in no acute distress  No carotid bruits are present.No neck pain distention present at 10 - 15 degrees. Thyroid normal to palpation  Heart rhythm and rate are normal with Grade 1/2 systolic  Murmur;no gallops.  Chest is clear with no increased work of breathing  There is no evidence of aortic aneurysm or renal artery bruits  Abdomen soft with no organomegaly or masses. No HJR  No clubbing, cyanosis or edema present.  Pedal pulses are intact   No ischemic skin changes are present . Nails healthy with chronic fungal changes   Alert and oriented. Interactive.Strength, tone, DTRs reflexes  normal          Assessment & Plan:  See Current Assessment & Plan in Problem List under specific Diagnosis

## 2013-11-23 NOTE — Assessment & Plan Note (Signed)
Trial of Sertraline @ low dose

## 2013-11-24 ENCOUNTER — Encounter: Payer: Self-pay | Admitting: Internal Medicine

## 2013-11-24 DIAGNOSIS — I10 Essential (primary) hypertension: Secondary | ICD-10-CM

## 2013-11-24 LAB — HEPATIC FUNCTION PANEL
Alkaline Phosphatase: 84 U/L (ref 39–117)
Bilirubin, Direct: 0.1 mg/dL (ref 0.0–0.3)
Total Bilirubin: 0.8 mg/dL (ref 0.3–1.2)
Total Protein: 8.3 g/dL (ref 6.0–8.3)

## 2013-11-24 LAB — LIPID PANEL
Cholesterol: 215 mg/dL — ABNORMAL HIGH (ref 0–200)
HDL: 68.9 mg/dL (ref >39.00)
Total CHOL/HDL Ratio: 3
VLDL: 38.8 mg/dL (ref 0.0–40.0)

## 2013-11-24 LAB — CK: Total CK: 77 U/L (ref 7–177)

## 2013-11-24 MED ORDER — HYDRALAZINE HCL 25 MG PO TABS: 25.0000 mg | ORAL_TABLET | Freq: Three times a day (TID) | ORAL | Status: DC

## 2013-11-24 MED ORDER — SERTRALINE HCL 25 MG PO TABS: 25.0000 mg | ORAL_TABLET | Freq: Every day | ORAL | Status: DC

## 2013-11-25 LAB — LDL CHOLESTEROL, DIRECT: Direct LDL: 135.1 mg/dL

## 2013-11-26 ENCOUNTER — Encounter: Payer: Self-pay | Admitting: Internal Medicine

## 2013-12-01 MED ORDER — SIMVASTATIN 40 MG PO TABS: 20.0000 mg | ORAL_TABLET | Freq: Every day | ORAL | Status: DC

## 2013-12-01 NOTE — Telephone Encounter (Signed)
Rx sent to the pharmacy by e-script.//AB/CMA 

## 2014-02-04 ENCOUNTER — Other Ambulatory Visit: Payer: Self-pay | Admitting: Internal Medicine

## 2014-03-28 ENCOUNTER — Ambulatory Visit: Payer: Medicare Other

## 2014-03-28 ENCOUNTER — Ambulatory Visit (INDEPENDENT_AMBULATORY_CARE_PROVIDER_SITE_OTHER): Payer: Medicare Other | Admitting: Family Medicine

## 2014-03-28 VITALS — BP 120/80 | HR 79 | Temp 99.3°F | Resp 18 | Wt 185.0 lb

## 2014-03-28 DIAGNOSIS — J209 Acute bronchitis, unspecified: Secondary | ICD-10-CM

## 2014-03-28 DIAGNOSIS — R059 Cough, unspecified: Secondary | ICD-10-CM

## 2014-03-28 DIAGNOSIS — R05 Cough: Secondary | ICD-10-CM | POA: Diagnosis not present

## 2014-03-28 MED ORDER — HYDROCOD POLST-CHLORPHEN POLST 10-8 MG/5ML PO LQCR: 5.0000 mL | Freq: Two times a day (BID) | ORAL | Status: DC | PRN

## 2014-03-28 MED ORDER — LEVOFLOXACIN 500 MG PO TABS: 500.0000 mg | ORAL_TABLET | Freq: Every day | ORAL | Status: DC

## 2014-03-28 NOTE — Progress Notes (Signed)
This chart was scribed for Caroleen Hamman, MD by Eston Mould, ED Scribe. This patient was seen in room Room/bed 12 and the patient's care was started at 11:28 AM. Subjective:    Patient ID: Toni [name], female    DOB: [birth year], 80 y.o.   MRN: [id number] Chief Complaint  Patient presents with   Cough    congetion, body aches, chills, fever since 5-7 days; per pt getting worse    HPI HPI Comments: Toni [name] is a 80 y.o. female who presents to [facility] complaining of ongoing dry persistent productive cough, congestion, body aches, back aches, fever, and chills that began 1 week ago and is worsening. She reports having a persistent productive cough with green sputum and states the cough is keeping her awake at night and reports having back pain due to coughing. She reports having a fever of 99.3 last night. Pt states her sinuses have been draining lately due to weather changes. Pt denies having allergies to medications. She denies having abd and ear pain.  Review of Systems  Constitutional: Positive for fever and chills.  HENT: Positive for congestion. Negative for ear pain.   Respiratory: Positive for cough.   Gastrointestinal: Negative for abdominal pain.  Musculoskeletal: Positive for back pain and myalgias.   Objective:   Physical Exam  Nursing note and vitals reviewed. Constitutional: She is oriented to person, place, and time. She appears well-developed and well-nourished. No distress.  HENT:  Head: Normocephalic and atraumatic.  Right Ear: External ear normal.  Left Ear: External ear normal.  Mouth/Throat: Oropharynx is clear and moist.  Throat is clear.  Eyes: Conjunctivae are normal. Pupils are equal, round, and reactive to light.  Neck: Normal range of motion. Neck supple. No thyromegaly present.  Cardiovascular: Normal rate and regular rhythm.   No murmur heard. Pulmonary/Chest: Effort normal. She has no wheezes.  Bilateral rhonchi.    Abdominal: Bowel sounds are normal. She exhibits no mass. There is no rebound and no guarding.  Musculoskeletal: Normal range of motion. She exhibits no tenderness.  Neurological: She is alert and oriented to person, place, and time. She has normal reflexes.  Skin: Skin is warm and dry. She is not diaphoretic.  Psychiatric: She has a normal mood and affect. Her behavior is normal.   [facility] reading (PRIMARY) by  Dr. Caroleen Hamman: No infiltrate.   Triage Vitals:BP 120/80   Pulse 79   Temp(Src) 99.3 F (37.4 C) (Oral)   Resp 18   Wt 185 lb (83.915 kg)   SpO2 98% Assessment & Plan:   Orders Placed This Encounter  Procedures   DG Chest 2 View    Standing Status: Future     Number of Occurrences:      Standing Expiration Date: 05/29/2015    Order Specific Question:  Reason for Exam (SYMPTOM  OR DIAGNOSIS REQUIRED)    Answer:  cough    Order Specific Question:  Preferred imaging location?    Answer:  External   Meds ordered this encounter  Medications   chlorpheniramine-HYDROcodone (TUSSIONEX PENNKINETIC ER) 10-8 MG/5ML LQCR    Sig: Take 5 mLs by mouth every 12 (twelve) hours as needed for cough.    Dispense:  115 mL    Refill:  0   levofloxacin (LEVAQUIN) 500 MG tablet    Sig: Take 1 tablet (500 mg total) by mouth daily.    Dispense:  7 tablet    Refill:  0   Cough - Plan: DG Chest  2 View, chlorpheniramine-HYDROcodone (TUSSIONEX PENNKINETIC ER) 10-8 MG/5ML LQCR, levofloxacin (LEVAQUIN) 500 MG tablet  Acute bronchitis - Plan: chlorpheniramine-HYDROcodone (TUSSIONEX PENNKINETIC ER) 10-8 MG/5ML LQCR, levofloxacin (LEVAQUIN) 500 MG tablet  Signed, Robyn Haber, MD

## 2014-03-28 NOTE — Patient Instructions (Signed)

## 2014-04-07 ENCOUNTER — Telehealth: Payer: Self-pay | Admitting: Internal Medicine

## 2014-04-07 NOTE — Telephone Encounter (Signed)
Patient was seen over at [facility] on 03/28/14 and diagnosed with acute bronchitis. She states that she is still coughing up green phlem and wants to know if something can be sent to [facility] on [address] for her. Please advise.

## 2014-04-07 NOTE — Telephone Encounter (Signed)
I reviewed the record ; she was given Levaquin, one of the strongest antibiotics. I recommend she be seen if no better

## 2014-04-08 NOTE — Telephone Encounter (Signed)
Phone call to patient and has been advised if not better would need to be seen. She states this morning she is feeling much better. Has not coughed much. Will watch symptoms for next couple days and be back in touch if something changes.

## 2014-05-13 ENCOUNTER — Other Ambulatory Visit: Payer: Self-pay

## 2014-05-13 DIAGNOSIS — R51 Headache: Principal | ICD-10-CM

## 2014-05-13 DIAGNOSIS — G4486 Cervicogenic headache: Secondary | ICD-10-CM

## 2014-05-13 MED ORDER — SERTRALINE HCL 25 MG PO TABS: 25.0000 mg | ORAL_TABLET | Freq: Every day | ORAL | Status: DC

## 2014-05-13 NOTE — Telephone Encounter (Signed)
Last office visit with you was on 11/23/13 Med last filled 11/24/13 #30 plus 2 refills

## 2014-05-13 NOTE — Telephone Encounter (Signed)
#  30, R X 2 

## 2014-06-03 ENCOUNTER — Other Ambulatory Visit: Payer: Self-pay

## 2014-06-03 MED ORDER — METOPROLOL TARTRATE 25 MG PO TABS: 25.0000 mg | ORAL_TABLET | Freq: Two times a day (BID) | ORAL | Status: DC

## 2014-06-03 MED ORDER — TRANDOLAPRIL 4 MG PO TABS: 4.0000 mg | ORAL_TABLET | Freq: Every day | ORAL | Status: DC

## 2014-06-14 DIAGNOSIS — H04129 Dry eye syndrome of unspecified lacrimal gland: Secondary | ICD-10-CM | POA: Diagnosis not present

## 2014-07-16 ENCOUNTER — Other Ambulatory Visit: Payer: Self-pay

## 2014-07-16 DIAGNOSIS — I10 Essential (primary) hypertension: Secondary | ICD-10-CM

## 2014-07-16 DIAGNOSIS — G4486 Cervicogenic headache: Secondary | ICD-10-CM

## 2014-07-16 DIAGNOSIS — R51 Headache: Principal | ICD-10-CM

## 2014-07-16 MED ORDER — HYDRALAZINE HCL 25 MG PO TABS: 25.0000 mg | ORAL_TABLET | Freq: Three times a day (TID) | ORAL | Status: DC

## 2014-07-16 MED ORDER — SERTRALINE HCL 25 MG PO TABS: 25.0000 mg | ORAL_TABLET | Freq: Every day | ORAL | Status: DC

## 2014-08-18 ENCOUNTER — Other Ambulatory Visit (INDEPENDENT_AMBULATORY_CARE_PROVIDER_SITE_OTHER): Payer: Medicare Other

## 2014-08-18 ENCOUNTER — Encounter: Payer: Self-pay | Admitting: Internal Medicine

## 2014-08-18 ENCOUNTER — Ambulatory Visit (INDEPENDENT_AMBULATORY_CARE_PROVIDER_SITE_OTHER): Payer: Medicare Other | Admitting: Internal Medicine

## 2014-08-18 VITALS — BP 152/84 | HR 62 | Temp 98.7°F | Ht 65.0 in | Wt 182.0 lb

## 2014-08-18 DIAGNOSIS — I10 Essential (primary) hypertension: Secondary | ICD-10-CM

## 2014-08-18 DIAGNOSIS — G44229 Chronic tension-type headache, not intractable: Secondary | ICD-10-CM

## 2014-08-18 DIAGNOSIS — Z860100 Personal history of colon polyps, unspecified: Secondary | ICD-10-CM

## 2014-08-18 DIAGNOSIS — Z23 Encounter for immunization: Secondary | ICD-10-CM

## 2014-08-18 DIAGNOSIS — R7309 Other abnormal glucose: Secondary | ICD-10-CM

## 2014-08-18 DIAGNOSIS — E785 Hyperlipidemia, unspecified: Secondary | ICD-10-CM

## 2014-08-18 LAB — BASIC METABOLIC PANEL
BUN: 13 mg/dL (ref 6–23)
CALCIUM: 9.2 mg/dL (ref 8.4–10.5)
CO2: 25 mEq/L (ref 19–32)
CREATININE: 0.9 mg/dL (ref 0.4–1.2)
Chloride: 105 mEq/L (ref 96–112)
GFR: 64.27 mL/min (ref >60.00)
Glucose, Bld: 101 mg/dL — ABNORMAL HIGH (ref 70–99)
Potassium: 4.6 mEq/L (ref 3.5–5.1)
Sodium: 139 mEq/L (ref 135–145)

## 2014-08-18 LAB — LIPID PANEL
Cholesterol: 248 mg/dL — ABNORMAL HIGH (ref 0–200)
HDL: 63 mg/dL (ref >39.00)
LDL Cholesterol: 148 mg/dL — ABNORMAL HIGH (ref 0–99)
NonHDL: 185
Total CHOL/HDL Ratio: 4
Triglycerides: 184 mg/dL — ABNORMAL HIGH (ref 0.0–149.0)
VLDL: 36.8 mg/dL (ref 0.0–40.0)

## 2014-08-18 LAB — CBC WITH DIFFERENTIAL/PLATELET
BASOS ABS: 0 10*3/uL (ref 0.0–0.1)
Basophils Relative: 0.3 % (ref 0.0–3.0)
Eosinophils Absolute: 0.2 10*3/uL (ref 0.0–0.7)
Eosinophils Relative: 3 % (ref 0.0–5.0)
HCT: 40.8 % (ref 36.0–46.0)
Hemoglobin: 14.1 g/dL (ref 12.0–15.0)
Lymphocytes Relative: 27.9 % (ref 12.0–46.0)
Lymphs Abs: 1.9 10*3/uL (ref 0.7–4.0)
MCHC: 34.6 g/dL (ref 30.0–36.0)
MCV: 90.6 fl (ref 78.0–100.0)
MONOS PCT: 7 % (ref 3.0–12.0)
Monocytes Absolute: 0.5 10*3/uL (ref 0.1–1.0)
NEUTROS PCT: 61.8 % (ref 43.0–77.0)
Neutro Abs: 4.2 10*3/uL (ref 1.4–7.7)
PLATELETS: 334 10*3/uL (ref 150.0–400.0)
RBC: 4.51 Mil/uL (ref 3.87–5.11)
RDW: 13.5 % (ref 11.5–15.5)
WBC: 6.8 10*3/uL (ref 4.0–10.5)

## 2014-08-18 LAB — HEPATIC FUNCTION PANEL
ALT: 16 U/L (ref 0–35)
AST: 20 U/L (ref 0–37)
Albumin: 4 g/dL (ref 3.5–5.2)
Alkaline Phosphatase: 71 U/L (ref 39–117)
BILIRUBIN DIRECT: 0.1 mg/dL (ref 0.0–0.3)
BILIRUBIN TOTAL: 0.6 mg/dL (ref 0.2–1.2)
Total Protein: 7.1 g/dL (ref 6.0–8.3)

## 2014-08-18 LAB — HEMOGLOBIN A1C: Hgb A1c MFr Bld: 5.5 % (ref 4.6–6.5)

## 2014-08-18 LAB — TSH: TSH: 1.63 u[IU]/mL (ref 0.35–4.50)

## 2014-08-18 MED ORDER — TRAMADOL HCL 50 MG PO TABS: ORAL_TABLET | ORAL | Status: DC

## 2014-08-18 MED ORDER — SERTRALINE HCL 50 MG PO TABS: 50.0000 mg | ORAL_TABLET | Freq: Every day | ORAL | Status: DC

## 2014-08-18 MED ORDER — LORAZEPAM 0.5 MG PO TABS: 0.5000 mg | ORAL_TABLET | Freq: Every evening | ORAL | Status: DC | PRN

## 2014-08-18 NOTE — Assessment & Plan Note (Signed)
Sertraline dose increased

## 2014-08-18 NOTE — Assessment & Plan Note (Signed)
CBC

## 2014-08-18 NOTE — Patient Instructions (Signed)
Minimal Blood Pressure Goal= AVERAGE < 150/90;  Ideal is an AVERAGE < 135/85. This AVERAGE should be calculated from @ least 5-7 BP readings taken @ different times of day on different days of week. You should not respond to isolated BP readings , but rather the AVERAGE for that week .Please bring your  blood pressure cuff to office visits to verify that it is reliable.It  can also be checked against the blood pressure device at the pharmacy. Finger or wrist cuffs are not dependable; an arm cuff is.  Your next office appointment will be determined based upon review of your pending labs . Those instructions will be transmitted to you through My Chart.

## 2014-08-18 NOTE — Progress Notes (Signed)
   Subjective:    Patient ID: Toni [name], female    DOB: 1934-01-26, 80 y.o.   MRN: [id number]  HPI  She is here to assess active health issues & conditions. PMH, FH, & Social history verified & updated   She stopped her statin 3 months ago concerned about potential adverse effects. Her last lipids on record were 11/23/13. At that time triglycerides 194; HDL 68.9; and LDL 135.1. TSH was therapeutic & liver function tests were normal.  Based on the advanced cholesterol testing; her LDL goal is less than 130, ideally less than 100. There is no history of premature heart attack or stroke; father had MI @ 31.[place]   She has no adverse effects from the blood pressure medicines.   Her headaches have responded dramatically to the sertraline; she is inquiring as to increasing the dose; she is only on 25 mg at this time.   Some intermittent dizziness symptoms w/o vertigo for years which has been evaluated in detail by a neurologist. She states the diagnosis was "an irritated cervical nerve ending". Denied were any change in heart rhythm or rate prior to the dizziness. There was no associated chest pain or shortness of breath .  Also specifically denied prior to dizziness were headache, limb weakness, tingling, or numbness.    Review of Systems   All below NEGATIVE: Chest pain, palpitations       Dyspnea Edema Claudication  Weight change Polyuria/phagia/dipsia    Blurred vision /diplopia/lossof vision Non healing skin lesions Abd pain, bowel changes   Myalgias      Objective:   Physical Exam  Positive pertinent findings include: Appears younger than stated age. BMI 30.29. There is slight splitting of the first heart sound. Deep tendon reflexes are 0+ at the knees. She has minor crepitus of knees. She also is minor PIP osteoarthritic changes.  General appearance :adequately nourished; in no distress. Eyes: No conjunctival inflammation or scleral icterus is present. Oral exam:  Dental hygiene is good. Lips and gums are healthy appearing.There is no oropharyngeal erythema or exudate noted.  Heart:  Normal rate and regular rhythm.  S2 normal without gallop, murmur, click, rub or other extra sounds   Lungs:Chest clear to auscultation; no wheezes, rhonchi,rales ,or rubs present.No increased work of breathing.  Abdomen: bowel sounds normal, soft and non-tender without masses, organomegaly or hernias noted.  No guarding or rebound.  Skin:Warm & dry.  Intact without suspicious lesions or rashes ; no jaundice or tenting Lymphatic: No lymphadenopathy is noted about the head, neck, axilla            Assessment & Plan:  See Current Assessment & Plan in Problem List under specific Diagnosis

## 2014-08-18 NOTE — Assessment & Plan Note (Signed)
BMET 

## 2014-08-18 NOTE — Assessment & Plan Note (Signed)
A1c

## 2014-08-18 NOTE — Progress Notes (Signed)
Pre visit review using our clinic review tool, if applicable. No additional management support is needed unless otherwise documented below in the visit note. 

## 2014-08-18 NOTE — Assessment & Plan Note (Signed)
Lipids, LFTs, TSH  

## 2014-08-19 ENCOUNTER — Ambulatory Visit: Payer: Medicare Other | Admitting: Internal Medicine

## 2014-10-05 ENCOUNTER — Ambulatory Visit (INDEPENDENT_AMBULATORY_CARE_PROVIDER_SITE_OTHER): Payer: Medicare Other | Admitting: Internal Medicine

## 2014-10-05 ENCOUNTER — Encounter: Payer: Self-pay | Admitting: Internal Medicine

## 2014-10-05 VITALS — BP 156/88 | HR 105 | Temp 98.2°F | Resp 13 | Wt 179.2 lb

## 2014-10-05 DIAGNOSIS — J209 Acute bronchitis, unspecified: Secondary | ICD-10-CM

## 2014-10-05 MED ORDER — AZITHROMYCIN 250 MG PO TABS: ORAL_TABLET | ORAL | Status: DC

## 2014-10-05 MED ORDER — HYDROCODONE-HOMATROPINE 5-1.5 MG/5ML PO SYRP: 5.0000 mL | ORAL_SOLUTION | Freq: Four times a day (QID) | ORAL | Status: DC | PRN

## 2014-10-05 NOTE — Progress Notes (Signed)
Pre visit review using our clinic review tool, if applicable. No additional management support is needed unless otherwise documented below in the visit note. 

## 2014-10-05 NOTE — Patient Instructions (Signed)
Carry room temperature water and sip liberally after coughing. 

## 2014-10-05 NOTE — Progress Notes (Signed)
   Subjective:    Patient ID: Toni [name], female    DOB: 1933-12-12, 80 y.o.   MRN: [id number]  HPI  Her symptoms began 1.5 weeks ago cough which has progressed. As of last night and this morning she is producing significant amounts of green sputum.  The cough is paroxysmal and racking. This is resulted in discomfort in her ribs and back muscles.  She has had low-grade fever and chills.  She's had some shortness of breath with the cough  She had smoked until 18 years ago; she smoked 1.2 packs per day.  She denies symptoms of upper respiratory tract infection.    Review of Systems Frontal headache, facial pain , nasal purulence, dental pain, sore throat , otic pain or otic discharge denied. No fever , chills or sweats.     Objective:   Physical Exam    Pertinent positive findings include: She exhibits intermittent paroxysmal cough which is loose but nonproductive.   General appearance:good health ;well nourished; no acute distress or increased work of breathing is present.  No  lymphadenopathy about the head, neck, or axilla noted.   Eyes: No conjunctival inflammation or lid edema is present. There is no scleral icterus.  Ears:  External ear exam shows no significant lesions or deformities.  Otoscopic examination reveals clear canals, tympanic membranes are intact bilaterally without bulging, retraction, inflammation or discharge.  Nose:  External nasal examination shows no deformity or inflammation. Nasal mucosa are pink and moist without lesions or exudates. No septal dislocation or deviation.No obstruction to airflow.   Oral exam: Dental hygiene is good; lips and gums are healthy appearing.There is no oropharyngeal erythema or exudate noted.   Neck:  No deformities, thyromegaly, masses, or tenderness noted.   Supple with full range of motion without pain.   Heart:  Normal rate and regular rhythm. S1 and S2 normal without gallop, murmur, click, rub or other extra  sounds.   Lungs:Chest clear to auscultation; no wheezes, rhonchi,rales ,or rubs present.No increased work of breathing.    Extremities:  No cyanosis, edema, or clubbing  noted    Skin: Warm & dry w/o jaundice or tenting.         Assessment & Plan:  #1 acute bronchitis w/o bronchospasm; no URI Plan: See orders and recommendations

## 2014-11-16 ENCOUNTER — Other Ambulatory Visit: Payer: Self-pay | Admitting: Internal Medicine

## 2014-12-10 DIAGNOSIS — A0472 Enterocolitis due to Clostridium difficile, not specified as recurrent: Secondary | ICD-10-CM

## 2014-12-10 HISTORY — DX: Enterocolitis due to Clostridium difficile, not specified as recurrent: A04.72

## 2014-12-20 ENCOUNTER — Telehealth: Payer: Self-pay | Admitting: Internal Medicine

## 2015-02-13 ENCOUNTER — Other Ambulatory Visit: Payer: Self-pay | Admitting: Internal Medicine

## 2015-02-14 NOTE — Telephone Encounter (Signed)
Ok X 3 mos then needs OV

## 2015-02-22 ENCOUNTER — Encounter: Payer: Self-pay | Admitting: Internal Medicine

## 2015-02-22 ENCOUNTER — Other Ambulatory Visit: Payer: Self-pay | Admitting: Internal Medicine

## 2015-02-22 ENCOUNTER — Other Ambulatory Visit (INDEPENDENT_AMBULATORY_CARE_PROVIDER_SITE_OTHER): Payer: Medicare Other

## 2015-02-22 ENCOUNTER — Ambulatory Visit (INDEPENDENT_AMBULATORY_CARE_PROVIDER_SITE_OTHER): Payer: Medicare Other | Admitting: Internal Medicine

## 2015-02-22 VITALS — BP 138/72 | HR 76 | Temp 98.2°F | Ht 65.0 in | Wt 183.1 lb

## 2015-02-22 DIAGNOSIS — R209 Unspecified disturbances of skin sensation: Secondary | ICD-10-CM | POA: Diagnosis not present

## 2015-02-22 DIAGNOSIS — I10 Essential (primary) hypertension: Secondary | ICD-10-CM | POA: Diagnosis not present

## 2015-02-22 DIAGNOSIS — R059 Cough, unspecified: Secondary | ICD-10-CM

## 2015-02-22 DIAGNOSIS — R413 Other amnesia: Secondary | ICD-10-CM | POA: Diagnosis not present

## 2015-02-22 DIAGNOSIS — R05 Cough: Secondary | ICD-10-CM | POA: Diagnosis not present

## 2015-02-22 DIAGNOSIS — E538 Deficiency of other specified B group vitamins: Secondary | ICD-10-CM

## 2015-02-22 DIAGNOSIS — R202 Paresthesia of skin: Secondary | ICD-10-CM

## 2015-02-22 LAB — RPR

## 2015-02-22 LAB — TSH: TSH: 1.01 u[IU]/mL (ref 0.35–4.50)

## 2015-02-22 LAB — VITAMIN B12: VITAMIN B 12: 272 pg/mL (ref 211–911)

## 2015-02-22 MED ORDER — LOSARTAN POTASSIUM 100 MG PO TABS: 100.0000 mg | ORAL_TABLET | Freq: Every day | ORAL | Status: DC

## 2015-02-22 MED ORDER — DONEPEZIL HCL 5 MG PO TABS: 5.0000 mg | ORAL_TABLET | Freq: Every day | ORAL | Status: DC

## 2015-02-22 NOTE — Patient Instructions (Signed)
  Your next office appointment will be determined based upon review of your pending labs &    x-rays. Those instructions will be transmitted to you through My Chart   Critical values will be called. Followup as needed for any active or acute issue. Please report any significant change in your symptoms.

## 2015-02-22 NOTE — Progress Notes (Signed)
   Subjective:    Patient ID: Toni [name], female    DOB: 1934-01-03, 81 y.o.   MRN: [id number]  HPI She actually has 2 concerns. Both she and her family are concerned that she has short-term memory loss. To date there's been no evaluation officially. Symptoms began approximately 2 years ago and involves short-term recall. It has progressed slowly.  She also describes a cough that she's had for 2-3 months. She is on an ACE inhibitor.  She has some itchy, watery eyes and occasional sinus pressure.  She has reflux but it's well-controlled with medication except for some anorexia .  She denies any upper respiratory tract infection symptoms, extrinsic symptoms, or other GI symptoms.   Review of Systems Frontal headache, facial pain , nasal purulence, dental pain, sore throat , otic pain or otic discharge denied. No fever , chills or sweats. Extrinsic symptoms of sneezing or angioedema are denied. There is no sputum production, wheezing,or  paroxysmal nocturnal dyspnea. Unexplained weight loss, abdominal pain, significant dyspepsia, dysphagia, melena, rectal bleeding, or persistently small caliber stools are denied.     Objective:   Physical Exam  Pertinent positive findings include: Thinning of eyebrows is present laterally. There is enhanced sensation to light touch over the right lower face compared to the left.  She has no cranial nerve deficit.   General appearance:Adequately nourished; no acute distress or increased work of breathing is present.  No  lymphadenopathy about the head, neck, or axilla noted.  Eyes: No conjunctival inflammation or lid edema is present. There is no scleral icterus. Ears:  External ear exam shows no significant lesions or deformities.  Otoscopic examination reveals clear canals, tympanic membranes are intact bilaterally without bulging, retraction, inflammation or discharge. Nose:  External nasal examination shows no deformity or inflammation. Nasal  mucosa are pink and moist without lesions or exudates. No septal dislocation or deviation.No obstruction to airflow.  Oral exam: Dental hygiene is good; lips and gums are healthy appearing.There is no oropharyngeal erythema or exudate noted.  Neck:  No deformities, thyromegaly, masses, or tenderness noted.   Supple with full range of motion without pain. Heart:  Normal rate and regular rhythm. S1 and S2 normal without gallop, murmur, click, rub or other extra sounds.  Lungs:Chest clear to auscultation; no wheezes, rhonchi,rales ,or rubs present. Extremities:  No cyanosis, edema, or clubbing  noted  Skin: Warm & dry w/o jaundice or tenting.      Assessment & Plan:  #1 short-term memory loss with a score of 25 out of 30 on Mini-Mental status exam  #2 nonproductive cough most likely related to ACE inhibitor  Plan: See orders and recommendations

## 2015-02-22 NOTE — Progress Notes (Signed)
Pre visit review using our clinic review tool, if applicable. No additional management support is needed unless otherwise documented below in the visit note. 

## 2015-03-03 ENCOUNTER — Ambulatory Visit (INDEPENDENT_AMBULATORY_CARE_PROVIDER_SITE_OTHER)
Admission: RE | Admit: 2015-03-03 | Discharge: 2015-03-03 | Disposition: A | Discharge status: Home / Self Care | Payer: Medicare Other | Source: Ambulatory Visit | Attending: Internal Medicine | Admitting: Internal Medicine

## 2015-03-03 DIAGNOSIS — R413 Other amnesia: Secondary | ICD-10-CM

## 2015-03-03 DIAGNOSIS — I639 Cerebral infarction, unspecified: Secondary | ICD-10-CM | POA: Diagnosis not present

## 2015-03-03 DIAGNOSIS — R209 Unspecified disturbances of skin sensation: Secondary | ICD-10-CM | POA: Diagnosis not present

## 2015-03-03 DIAGNOSIS — R202 Paresthesia of skin: Secondary | ICD-10-CM

## 2015-03-04 ENCOUNTER — Other Ambulatory Visit: Payer: Self-pay | Admitting: Internal Medicine

## 2015-03-04 DIAGNOSIS — R413 Other amnesia: Secondary | ICD-10-CM | POA: Insufficient documentation

## 2015-03-04 DIAGNOSIS — R9089 Other abnormal findings on diagnostic imaging of central nervous system: Secondary | ICD-10-CM | POA: Insufficient documentation

## 2015-03-15 DIAGNOSIS — K5730 Diverticulosis of large intestine without perforation or abscess without bleeding: Secondary | ICD-10-CM | POA: Diagnosis not present

## 2015-03-15 DIAGNOSIS — R102 Pelvic and perineal pain: Secondary | ICD-10-CM | POA: Diagnosis not present

## 2015-03-15 DIAGNOSIS — K5732 Diverticulitis of large intestine without perforation or abscess without bleeding: Secondary | ICD-10-CM | POA: Diagnosis not present

## 2015-03-21 ENCOUNTER — Other Ambulatory Visit: Payer: Self-pay

## 2015-03-21 DIAGNOSIS — R413 Other amnesia: Secondary | ICD-10-CM

## 2015-03-21 MED ORDER — DONEPEZIL HCL 5 MG PO TABS: 5.0000 mg | ORAL_TABLET | Freq: Every day | ORAL | Status: DC

## 2015-03-22 ENCOUNTER — Encounter: Payer: Self-pay | Admitting: Neurology

## 2015-03-22 ENCOUNTER — Ambulatory Visit (INDEPENDENT_AMBULATORY_CARE_PROVIDER_SITE_OTHER): Payer: Medicare Other | Admitting: Neurology

## 2015-03-22 VITALS — BP 170/90 | HR 86 | Resp 16 | Wt 188.0 lb

## 2015-03-22 DIAGNOSIS — R930 Abnormal findings on diagnostic imaging of skull and head, not elsewhere classified: Secondary | ICD-10-CM

## 2015-03-22 DIAGNOSIS — I10 Essential (primary) hypertension: Secondary | ICD-10-CM | POA: Diagnosis not present

## 2015-03-22 DIAGNOSIS — E785 Hyperlipidemia, unspecified: Secondary | ICD-10-CM

## 2015-03-22 DIAGNOSIS — R413 Other amnesia: Secondary | ICD-10-CM

## 2015-03-22 NOTE — Progress Notes (Signed)
NEUROLOGY CONSULTATION NOTE  Toni [name] MRN: [id number] DOB: 05-14-1934  Referring provider: Dr. Unice [name] Primary care provider: Dr. Unice [name]  Reason for consult:  Memory loss  Dear Toni [name]:  Thank you for your kind referral of Toni [name] for consultation of the above symptoms. Although her history is well known to you, please allow me to reiterate it for the purpose of our medical record. The patient was accompanied to the clinic by her husband, daughter, and son, who also provide collateral information. Records and images were personally reviewed where available.  HISTORY OF PRESENT ILLNESS: This is a pleasant 81 year old ambidextrous left-hand dominant woman with a history of hypertension, hyperlipidemia, presenting for evaluation of memory loss with abnormal head CT. When asked about her memory, Toni [name] reports her short-term memory is "not good at all." Long-term memory is good, she can remember details about her childhood well. She started noticing changes in the past year, but worse in the past 4-5 months. She would forget conversations from 10 minutes ago. She forgot to pay bills in the past 2-3 months. She left the stove on twice around 3-4 months ago. She denies getting lost driving. She denies any problems multitasking, very seldom word-finding difficulties. Her son has noticed that when he calls her, she would repeat the same story at the end of the call, or have the same conversation 10 minutes later. He also noticed changes in the past 1-2 years, worse in the past 3-4 months. Her daughter feels symptoms started a little longer than 2 years ago, she forgot a conversation that someone was in jail in December 2015. She was staying at her daughter's house and forgot what they had talked about on their to-do list. They also have noticed she is very anxious. Her husband reports she had taken something out of the freezer one time, he came out the next  day to see some things were not put back inside. He has noticed that she is more argumentative.   She reports 4 episodes where a shade would come up her right eye. This would last for a few minutes, last episode was in the fall of last year. No associated headache or focal numbness/tingling/weakness. She states she "always has headaches," indicating these are sinus headaches. She had seen Toni [name] in 2013 and was diagnosed with cervicogenic headaches. She has headaches around three times a week, with right frontal throbbing pain, relieved with over the counter pain medication. There is no associated nausea/vomiting/photo/phonophobia. She has been diagnosed with migraines where she has a round circle in her right eye occurring around 3-4 times a month. She denies any dizziness, diplopia, blurred vision, neck/back pain, focal numbness/tingling/weakness. She has chronic constipations. She has occasional hand tremors. No anosmia. She denies any family history of memory problems. No history of head injuries. She drinks 1 to 1-1/2 glasses of wine at night.   I personally reviewed head CT without contrast done 03/03/15. There were several hypodensities in the bilateral hemispheres, right greater than left This was read as multifocal posterior circulation infarction with the largest area in the right occiptial pole and smaller area in th e left occipital cortex. Two small areas of cortical and subcortical infarct in the high right parietal lobe, small vessel infarct in the upper right cerebellum. These could be subacute or chronic. Brain atrophy with mild frontal predominance seen.  Laboratory Data: Lab Results  Component Value Date   WBC 6.8 08/18/2014  HGB 14.1 08/18/2014   HCT 40.8 08/18/2014   MCV 90.6 08/18/2014   PLT 334.0 08/18/2014     Chemistry      Component Value Date/Time   NA 139 08/18/2014 1019   K 4.6 08/18/2014 1019   CL 105 08/18/2014 1019   CO2 25 08/18/2014 1019   BUN 13 08/18/2014  1019   CREATININE 0.9 08/18/2014 1019      Component Value Date/Time   CALCIUM 9.2 08/18/2014 1019   ALKPHOS 71 08/18/2014 1019   AST 20 08/18/2014 1019   ALT 16 08/18/2014 1019   BILITOT 0.6 08/18/2014 1019     Lab Results  Component Value Date   VITAMINB12 272 02/22/2015   Lab Results  Component Value Date   TSH 1.01 02/22/2015     PAST MEDICAL HISTORY: Past Medical History  Diagnosis Date  . PUD (peptic ulcer disease) 1987    with h pylori  . Hepatic cyst   . Migraines   . Hypertension   . Hyperlipemia   . Colonic polyp   . Diverticulosis   . Helicobacter pylori gastritis 1987    PAST SURGICAL HISTORY: Past Surgical History  Procedure Laterality Date  . Abdominal hysterectomy      BSO ; endometriosis; ? Appendectomy incidentally  . Cholecystectomy  2008  . Rotator cuff repair      right  . Colonoscopy  2003 & 2012    polyps; Toni [name]  . Endoscopy gastritis  2008  . Tonsillectomy and adenoidectomy      MEDICATIONS: Current Outpatient Prescriptions on File Prior to Visit  Medication Sig Dispense Refill  . aspirin 81 MG tablet Take 81 mg by mouth daily.    . cholestyramine (QUESTRAN) 4 GM/DOSE powder Take 0.5 packets (2 g total) by mouth daily. 378 g 5  . cycloSPORINE (RESTASIS) 0.05 % ophthalmic emulsion 1 drop 2 (two) times daily.      Marland Kitchen docusate sodium (COLACE) 100 MG capsule Take 100 mg by mouth 2 (two) times daily.    Marland Kitchen donepezil (ARICEPT) 5 MG tablet Take 1 tablet (5 mg total) by mouth at bedtime. 30 tablet 2  . Fish Oil-Cholecalciferol (FISH OIL + D3) 1000-1000 MG-UNIT CAPS Take by mouth daily.    . hydrALAZINE (APRESOLINE) 25 MG tablet TAKE 1 TABLET (25 MG TOTAL) BY MOUTH 3 (THREE) TIMES DAILY. 270 tablet 0  . hydrochlorothiazide (HYDRODIURIL) 25 MG tablet Take 0.5 tablets (12.5 mg total) by mouth daily. TAKE 1 TABLET EVERY DAY 90 tablet 1  . LORazepam (ATIVAN) 0.5 MG tablet Take 1 tablet (0.5 mg total) by mouth at bedtime as needed. 30 tablet 1  .  losartan (COZAAR) 100 MG tablet Take 1 tablet (100 mg total) by mouth daily. 90 tablet 3  . magnesium 30 MG tablet Take 30 mg by mouth daily.     . metoprolol tartrate (LOPRESSOR) 25 MG tablet Take 1 tablet (25 mg total) by mouth 2 (two) times daily. 180 tablet 3  . omeprazole (PRILOSEC) 20 MG capsule Take 1 capsule by mouth daily.    . sertraline (ZOLOFT) 50 MG tablet Take 1 tablet (50 mg total) by mouth daily. 30 tablet 5  . tiZANidine (ZANAFLEX) 2 MG tablet Take 1 tablet (2 mg total) by mouth every 8 (eight) hours as needed for muscle spasms. 30 tablet 5  . traMADol (ULTRAM) 50 MG tablet 1/2-1 every 8 hrs prn pain (Patient taking differently: every 6 (six) hours as needed. ) 30 tablet 2  No current facility-administered medications on file prior to visit.    ALLERGIES: Allergies  Allergen Reactions  . Mavik [Trandolapril]     02/22/15 cough    FAMILY HISTORY: Family History  Problem Relation Age of Onset  . Asthma Brother   . Hypertension Father   . Heart attack Father 44  . Leukemia Mother   . Stomach cancer Maternal Aunt   . Breast cancer Sister   . Thyroid disease Sister   . Stroke Neg Hx   . Diabetes Neg Hx     SOCIAL HISTORY: History   Social History  . Marital Status: Married    Spouse Name: N/A  . Number of Children: 2  . Years of Education: N/A   Occupational History  . Not on file.   Social History Main Topics  . Smoking status: Former Smoker    Quit date: 12/10/1973  . Smokeless tobacco: Never Used  . Alcohol Use: 0.0 oz/week    0 Standard drinks or equivalent per week     Comment:  socially  . Drug Use: No  . Sexual Activity: Not on file   Other Topics Concern  . Not on file   Social History Narrative    REVIEW OF SYSTEMS: Constitutional: No fevers, chills, or sweats, no generalized fatigue, change in appetite Eyes: No visual changes, double vision, eye pain Ear, nose and throat: No hearing loss, ear pain, nasal congestion, sore  throat Cardiovascular: No chest pain, palpitations Respiratory:  No shortness of breath at rest or with exertion, wheezes GastrointestinaI: No nausea, vomiting, diarrhea, abdominal pain, fecal incontinence Genitourinary:  No dysuria, urinary retention or frequency Musculoskeletal:  No neck pain, back pain Integumentary: No rash, pruritus, skin lesions Neurological: as above Psychiatric: No depression, insomnia, anxiety Endocrine: No palpitations, fatigue, diaphoresis, mood swings, change in appetite, change in weight, increased thirst Hematologic/Lymphatic:  No anemia, purpura, petechiae. Allergic/Immunologic: no itchy/runny eyes, nasal congestion, recent allergic reactions, rashes  PHYSICAL EXAM: Filed Vitals:   03/22/15 1250  BP: 170/90  Pulse: 86  Resp: 16   General: No acute distress Head:  Normocephalic/atraumatic Eyes: Fundoscopic exam shows bilateral sharp discs, no vessel changes, exudates, or hemorrhages Neck: supple, no paraspinal tenderness, full range of motion Back: No paraspinal tenderness Heart: regular rate and rhythm Lungs: Clear to auscultation bilaterally. Vascular: No carotid bruits. Skin/Extremities: No rash, no edema Neurological Exam: Mental status: alert and oriented to person, place, and time, no dysarthria or aphasia, Fund of knowledge is appropriate.  Remote memory intact.  Attention and concentration are normal.    Able to name objects and repeat phrases. She had more difficulty with visuospatial tasks [facility] Cognitive Assessment  03/22/2015  Visuospatial/ Executive (0/5) 1  Naming (0/3) 3  Attention: Read list of digits (0/2) 1  Attention: Read list of letters (0/1) 1  Attention: Serial 7 subtraction starting at 100 (0/3) 1  Language: Repeat phrase (0/2) 2  Language : Fluency (0/1) 1  Abstraction (0/2) 2  Delayed Recall (0/5) 0  Orientation (0/6) 6  Total 18  Adjusted Score (based on education) 18   Cranial nerves: CN I: not tested CN II:  pupils equal, round and reactive to light, visual fields intact, fundi unremarkable. CN III, IV, VI:  full range of motion, no nystagmus, no ptosis CN V: facial sensation intact CN VII: upper and lower face symmetric CN VIII: hearing intact to finger rub CN IX, X: gag intact, uvula midline CN XI: sternocleidomastoid and trapezius muscles intact CN XII:  tongue midline Bulk & Tone: normal, no fasciculations. Motor: 5/5 throughout with no pronator drift. Sensation: intact to light touch, cold, pin, vibration and joint position sense.  No extinction to double simultaneous stimulation.  Romberg test negative Deep Tendon Reflexes: +2 throughout, no ankle clonus Plantar responses: downgoing bilaterally Cerebellar: no incoordination on finger to nose, heel to shin. No dysdiadochokinesia Gait: narrow-based and steady, able to tandem walk adequately. Tremor: none  IMPRESSION: This is a pleasant 81 year old left-handed woman with vascular risk factors including hypertension, hyperlipidemia, presenting for evaluation of memory loss noticed more in the past 3-4 months. Her MOCA score today is 18/30, indicating mild cognitive impairment, possible mild dementia. She had more difficulties with visuospatial tasks. Her head CT had shown bilateral hypodensities more in the posterior head regions concerning for chronic infarcts, she denies any prior history of strokes and neurological exam today is otherwise non-focal. MRI brain without contrast will be ordered to assess if these changes are more subacute, although they appear chronic. Vascular dementia is considered, in light of her scan. We discussed control of vascular risk factors, continue daily aspirin for stroke prevention. Her B12 level is low normal, she may benefit from starting B12 supplement to goal B12 level above 400. We discussed the importance of physical exercise and brain stimulation exercises for brain health. She knows to go to the ER for any  significant change in symptoms. She will follow-up in 6 months.   Thank you for allowing me to participate in the care of this patient. Please do not hesitate to call for any questions or concerns.   Ellouise Newer, M.D.  CC: Toni. Linna [name]

## 2015-03-22 NOTE — Patient Instructions (Signed)
1. Schedule MRI brain without contrast 2. Start daily vitamin B12 500 mcg 3. Continue control of BP, cholesterol 4. Physical exercise and brain stimulation exercises are important for brain health 5. Follow-up in 6 months

## 2015-03-23 DIAGNOSIS — E785 Hyperlipidemia, unspecified: Secondary | ICD-10-CM | POA: Insufficient documentation

## 2015-03-23 DIAGNOSIS — I10 Essential (primary) hypertension: Secondary | ICD-10-CM | POA: Insufficient documentation

## 2015-03-23 DIAGNOSIS — R413 Other amnesia: Secondary | ICD-10-CM | POA: Insufficient documentation

## 2015-03-24 ENCOUNTER — Inpatient Hospital Stay (HOSPITAL_COMMUNITY)
Admission: EM | Admit: 2015-03-24 | Discharge: 2015-04-01 | DRG: 682 | Disposition: A | Discharge status: Home / Self Care | Payer: Medicare Other | Attending: Internal Medicine | Admitting: Internal Medicine

## 2015-03-24 ENCOUNTER — Emergency Department (HOSPITAL_COMMUNITY): Payer: Medicare Other

## 2015-03-24 ENCOUNTER — Inpatient Hospital Stay (HOSPITAL_COMMUNITY): Payer: Medicare Other

## 2015-03-24 ENCOUNTER — Encounter (HOSPITAL_COMMUNITY): Payer: Self-pay

## 2015-03-24 DIAGNOSIS — R531 Weakness: Secondary | ICD-10-CM | POA: Diagnosis not present

## 2015-03-24 DIAGNOSIS — B379 Candidiasis, unspecified: Secondary | ICD-10-CM | POA: Diagnosis present

## 2015-03-24 DIAGNOSIS — J9601 Acute respiratory failure with hypoxia: Secondary | ICD-10-CM | POA: Diagnosis not present

## 2015-03-24 DIAGNOSIS — G43909 Migraine, unspecified, not intractable, without status migrainosus: Secondary | ICD-10-CM | POA: Diagnosis present

## 2015-03-24 DIAGNOSIS — Z87891 Personal history of nicotine dependence: Secondary | ICD-10-CM

## 2015-03-24 DIAGNOSIS — E871 Hypo-osmolality and hyponatremia: Secondary | ICD-10-CM | POA: Diagnosis not present

## 2015-03-24 DIAGNOSIS — F0390 Unspecified dementia without behavioral disturbance: Secondary | ICD-10-CM | POA: Diagnosis present

## 2015-03-24 DIAGNOSIS — K5732 Diverticulitis of large intestine without perforation or abscess without bleeding: Secondary | ICD-10-CM | POA: Diagnosis not present

## 2015-03-24 DIAGNOSIS — Z888 Allergy status to other drugs, medicaments and biological substances status: Secondary | ICD-10-CM

## 2015-03-24 DIAGNOSIS — K5730 Diverticulosis of large intestine without perforation or abscess without bleeding: Secondary | ICD-10-CM | POA: Diagnosis not present

## 2015-03-24 DIAGNOSIS — R0600 Dyspnea, unspecified: Secondary | ICD-10-CM | POA: Diagnosis not present

## 2015-03-24 DIAGNOSIS — I639 Cerebral infarction, unspecified: Secondary | ICD-10-CM

## 2015-03-24 DIAGNOSIS — R42 Dizziness and giddiness: Secondary | ICD-10-CM | POA: Diagnosis not present

## 2015-03-24 DIAGNOSIS — R197 Diarrhea, unspecified: Secondary | ICD-10-CM | POA: Diagnosis not present

## 2015-03-24 DIAGNOSIS — I10 Essential (primary) hypertension: Secondary | ICD-10-CM | POA: Diagnosis present

## 2015-03-24 DIAGNOSIS — J181 Lobar pneumonia, unspecified organism: Secondary | ICD-10-CM | POA: Diagnosis not present

## 2015-03-24 DIAGNOSIS — I120 Hypertensive chronic kidney disease with stage 5 chronic kidney disease or end stage renal disease: Secondary | ICD-10-CM | POA: Diagnosis not present

## 2015-03-24 DIAGNOSIS — Z7982 Long term (current) use of aspirin: Secondary | ICD-10-CM

## 2015-03-24 DIAGNOSIS — E785 Hyperlipidemia, unspecified: Secondary | ICD-10-CM | POA: Diagnosis present

## 2015-03-24 DIAGNOSIS — I5031 Acute diastolic (congestive) heart failure: Secondary | ICD-10-CM | POA: Diagnosis present

## 2015-03-24 DIAGNOSIS — E861 Hypovolemia: Secondary | ICD-10-CM | POA: Diagnosis present

## 2015-03-24 DIAGNOSIS — N179 Acute kidney failure, unspecified: Secondary | ICD-10-CM | POA: Diagnosis present

## 2015-03-24 DIAGNOSIS — F329 Major depressive disorder, single episode, unspecified: Secondary | ICD-10-CM | POA: Diagnosis present

## 2015-03-24 DIAGNOSIS — E872 Acidosis: Secondary | ICD-10-CM | POA: Diagnosis present

## 2015-03-24 DIAGNOSIS — E43 Unspecified severe protein-calorie malnutrition: Secondary | ICD-10-CM | POA: Diagnosis present

## 2015-03-24 DIAGNOSIS — E876 Hypokalemia: Secondary | ICD-10-CM | POA: Diagnosis present

## 2015-03-24 DIAGNOSIS — Z8711 Personal history of peptic ulcer disease: Secondary | ICD-10-CM | POA: Diagnosis not present

## 2015-03-24 DIAGNOSIS — N19 Unspecified kidney failure: Secondary | ICD-10-CM | POA: Diagnosis not present

## 2015-03-24 DIAGNOSIS — K5792 Diverticulitis of intestine, part unspecified, without perforation or abscess without bleeding: Secondary | ICD-10-CM | POA: Diagnosis present

## 2015-03-24 DIAGNOSIS — A047 Enterocolitis due to Clostridium difficile: Secondary | ICD-10-CM | POA: Diagnosis present

## 2015-03-24 DIAGNOSIS — J811 Chronic pulmonary edema: Secondary | ICD-10-CM

## 2015-03-24 DIAGNOSIS — Z86010 Personal history of colonic polyps: Secondary | ICD-10-CM | POA: Diagnosis not present

## 2015-03-24 DIAGNOSIS — N281 Cyst of kidney, acquired: Secondary | ICD-10-CM | POA: Diagnosis present

## 2015-03-24 DIAGNOSIS — E669 Obesity, unspecified: Secondary | ICD-10-CM | POA: Diagnosis present

## 2015-03-24 DIAGNOSIS — R109 Unspecified abdominal pain: Secondary | ICD-10-CM | POA: Diagnosis not present

## 2015-03-24 DIAGNOSIS — Z90710 Acquired absence of both cervix and uterus: Secondary | ICD-10-CM

## 2015-03-24 DIAGNOSIS — N170 Acute kidney failure with tubular necrosis: Principal | ICD-10-CM | POA: Diagnosis present

## 2015-03-24 DIAGNOSIS — Z9049 Acquired absence of other specified parts of digestive tract: Secondary | ICD-10-CM | POA: Diagnosis present

## 2015-03-24 DIAGNOSIS — R630 Anorexia: Secondary | ICD-10-CM | POA: Diagnosis present

## 2015-03-24 DIAGNOSIS — E860 Dehydration: Secondary | ICD-10-CM | POA: Diagnosis not present

## 2015-03-24 DIAGNOSIS — Z6831 Body mass index (BMI) 31.0-31.9, adult: Secondary | ICD-10-CM

## 2015-03-24 DIAGNOSIS — R933 Abnormal findings on diagnostic imaging of other parts of digestive tract: Secondary | ICD-10-CM | POA: Diagnosis not present

## 2015-03-24 DIAGNOSIS — K7689 Other specified diseases of liver: Secondary | ICD-10-CM | POA: Diagnosis not present

## 2015-03-24 DIAGNOSIS — R0902 Hypoxemia: Secondary | ICD-10-CM

## 2015-03-24 LAB — CBC WITH DIFFERENTIAL/PLATELET
Basophils Absolute: 0 10*3/uL (ref 0.0–0.1)
Basophils Relative: 0 % (ref 0–1)
Eosinophils Absolute: 0.1 10*3/uL (ref 0.0–0.7)
Eosinophils Relative: 1 % (ref 0–5)
HEMATOCRIT: 33.7 % — AB (ref 36.0–46.0)
Hemoglobin: 11.9 g/dL — ABNORMAL LOW (ref 12.0–15.0)
Lymphocytes Relative: 9 % — ABNORMAL LOW (ref 12–46)
Lymphs Abs: 1 10*3/uL (ref 0.7–4.0)
MCH: 30.4 pg (ref 26.0–34.0)
MCHC: 35.3 g/dL (ref 30.0–36.0)
MCV: 86.2 fL (ref 78.0–100.0)
Monocytes Absolute: 0.9 10*3/uL (ref 0.1–1.0)
Monocytes Relative: 9 % (ref 3–12)
NEUTROS ABS: 8.5 10*3/uL — AB (ref 1.7–7.7)
Neutrophils Relative %: 81 % — ABNORMAL HIGH (ref 43–77)
Platelets: 342 10*3/uL (ref 150–400)
RBC: 3.91 MIL/uL (ref 3.87–5.11)
RDW: 13.1 % (ref 11.5–15.5)
WBC: 10.4 10*3/uL (ref 4.0–10.5)

## 2015-03-24 LAB — URINALYSIS, ROUTINE W REFLEX MICROSCOPIC
Bilirubin Urine: NEGATIVE
GLUCOSE, UA: NEGATIVE mg/dL
Hgb urine dipstick: NEGATIVE
Ketones, ur: NEGATIVE mg/dL
Nitrite: NEGATIVE
PH: 5.5 (ref 5.0–8.0)
Protein, ur: NEGATIVE mg/dL
Specific Gravity, Urine: 1.005 (ref 1.005–1.030)
Urobilinogen, UA: 0.2 mg/dL (ref 0.0–1.0)

## 2015-03-24 LAB — COMPREHENSIVE METABOLIC PANEL
ALT: 18 U/L (ref 0–35)
AST: 20 U/L (ref 0–37)
Albumin: 3.5 g/dL (ref 3.5–5.2)
Alkaline Phosphatase: 77 U/L (ref 39–117)
Anion gap: 13 (ref 5–15)
BUN: 40 mg/dL — ABNORMAL HIGH (ref 6–23)
CHLORIDE: 91 mmol/L — AB (ref 96–112)
CO2: 20 mmol/L (ref 19–32)
Calcium: 8 mg/dL — ABNORMAL LOW (ref 8.4–10.5)
Creatinine, Ser: 7.1 mg/dL — ABNORMAL HIGH (ref 0.50–1.10)
GFR calc Af Amer: 6 mL/min — ABNORMAL LOW (ref >90)
GFR calc non Af Amer: 5 mL/min — ABNORMAL LOW (ref >90)
Glucose, Bld: 108 mg/dL — ABNORMAL HIGH (ref 70–99)
POTASSIUM: 3.5 mmol/L (ref 3.5–5.1)
SODIUM: 124 mmol/L — AB (ref 135–145)
Total Bilirubin: 0.7 mg/dL (ref 0.3–1.2)
Total Protein: 6.5 g/dL (ref 6.0–8.3)

## 2015-03-24 LAB — CREATININE, SERUM
Creatinine, Ser: 6.8 mg/dL — ABNORMAL HIGH (ref 0.50–1.10)
GFR calc non Af Amer: 5 mL/min — ABNORMAL LOW (ref >90)
GFR, EST AFRICAN AMERICAN: 6 mL/min — AB (ref >90)

## 2015-03-24 LAB — CBC
HCT: 32.7 % — ABNORMAL LOW (ref 36.0–46.0)
Hemoglobin: 11.4 g/dL — ABNORMAL LOW (ref 12.0–15.0)
MCH: 30.2 pg (ref 26.0–34.0)
MCHC: 34.9 g/dL (ref 30.0–36.0)
MCV: 86.7 fL (ref 78.0–100.0)
PLATELETS: 286 10*3/uL (ref 150–400)
RBC: 3.77 MIL/uL — ABNORMAL LOW (ref 3.87–5.11)
RDW: 13 % (ref 11.5–15.5)
WBC: 9.7 10*3/uL (ref 4.0–10.5)

## 2015-03-24 LAB — SODIUM, URINE, RANDOM: Sodium, Ur: 11 mmol/L

## 2015-03-24 LAB — URINE MICROSCOPIC-ADD ON

## 2015-03-24 LAB — CREATININE, URINE, RANDOM: Creatinine, Urine: 38.21 mg/dL

## 2015-03-24 LAB — LIPASE, BLOOD: Lipase: 142 U/L — ABNORMAL HIGH (ref 11–59)

## 2015-03-24 LAB — CK: Total CK: 82 U/L (ref 7–177)

## 2015-03-24 LAB — MRSA PCR SCREENING: MRSA by PCR: INVALID — AB

## 2015-03-24 LAB — CLOSTRIDIUM DIFFICILE BY PCR: Toxigenic C. Difficile by PCR: NEGATIVE

## 2015-03-24 MED ORDER — ONDANSETRON HCL 4 MG/2ML IJ SOLN
4.0000 mg | Freq: Four times a day (QID) | INTRAMUSCULAR | Status: DC | PRN
  Administered 2015-03-24 – 2015-03-28 (×3): 4 mg via INTRAVENOUS
  Filled 2015-03-24 (×3): qty 2

## 2015-03-24 MED ORDER — ALUM & MAG HYDROXIDE-SIMETH 200-200-20 MG/5ML PO SUSP: 30.0000 mL | Freq: Four times a day (QID) | ORAL | Status: DC | PRN

## 2015-03-24 MED ORDER — HEPARIN SODIUM (PORCINE) 5000 UNIT/ML IJ SOLN
5000.0000 [IU] | Freq: Three times a day (TID) | INTRAMUSCULAR | Status: DC
  Administered 2015-03-24 – 2015-04-01 (×23): 5000 [IU] via SUBCUTANEOUS
  Filled 2015-03-24 (×23): qty 1

## 2015-03-24 MED ORDER — SODIUM CHLORIDE 0.9 % IV BOLUS (SEPSIS)
1000.0000 mL | Freq: Once | INTRAVENOUS | Status: AC
  Administered 2015-03-24: 1000 mL via INTRAVENOUS

## 2015-03-24 MED ORDER — DONEPEZIL HCL 5 MG PO TABS
5.0000 mg | ORAL_TABLET | Freq: Every day | ORAL | Status: DC
  Administered 2015-03-24 – 2015-03-31 (×8): 5 mg via ORAL
  Filled 2015-03-24 (×8): qty 1

## 2015-03-24 MED ORDER — SODIUM CHLORIDE 0.9 % IV SOLN
Freq: Once | INTRAVENOUS | Status: AC
  Administered 2015-03-24: 11:00:00 via INTRAVENOUS

## 2015-03-24 MED ORDER — ONDANSETRON HCL 4 MG PO TABS: 4.0000 mg | ORAL_TABLET | Freq: Four times a day (QID) | ORAL | Status: DC | PRN

## 2015-03-24 MED ORDER — PROMETHAZINE HCL 25 MG/ML IJ SOLN
12.5000 mg | Freq: Once | INTRAMUSCULAR | Status: AC
  Administered 2015-03-24: 12.5 mg via INTRAVENOUS
  Filled 2015-03-24: qty 1

## 2015-03-24 MED ORDER — RISAQUAD PO CAPS
1.0000 | ORAL_CAPSULE | Freq: Every day | ORAL | Status: DC
  Administered 2015-03-24 – 2015-04-01 (×9): 1 via ORAL
  Filled 2015-03-24 (×9): qty 1

## 2015-03-24 MED ORDER — PIPERACILLIN-TAZOBACTAM IN DEX 2-0.25 GM/50ML IV SOLN
2.2500 g | Freq: Three times a day (TID) | INTRAVENOUS | Status: DC
  Administered 2015-03-24 – 2015-03-27 (×9): 2.25 g via INTRAVENOUS
  Filled 2015-03-24 (×10): qty 50

## 2015-03-24 MED ORDER — SODIUM CHLORIDE 0.9 % IJ SOLN
3.0000 mL | Freq: Two times a day (BID) | INTRAMUSCULAR | Status: DC
  Administered 2015-03-24 – 2015-04-01 (×9): 3 mL via INTRAVENOUS

## 2015-03-24 MED ORDER — OXYCODONE HCL 5 MG PO TABS
5.0000 mg | ORAL_TABLET | ORAL | Status: DC | PRN
Start: 2015-03-24 — End: 2015-04-01
  Administered 2015-03-28 (×2): 5 mg via ORAL
  Filled 2015-03-24 (×2): qty 1

## 2015-03-24 MED ORDER — CETYLPYRIDINIUM CHLORIDE 0.05 % MT LIQD
7.0000 mL | Freq: Two times a day (BID) | OROMUCOSAL | Status: DC
  Administered 2015-03-24 – 2015-04-01 (×15): 7 mL via OROMUCOSAL

## 2015-03-24 MED ORDER — SODIUM CHLORIDE 0.9 % IV BOLUS (SEPSIS)
500.0000 mL | Freq: Once | INTRAVENOUS | Status: AC
  Administered 2015-03-24: 500 mL via INTRAVENOUS

## 2015-03-24 MED ORDER — CYCLOSPORINE 0.05 % OP EMUL
1.0000 [drp] | Freq: Two times a day (BID) | OPHTHALMIC | Status: DC
  Administered 2015-03-24 – 2015-04-01 (×16): 1 [drp] via OPHTHALMIC
  Filled 2015-03-24 (×17): qty 1

## 2015-03-24 MED ORDER — RESTORA PO CAPS: 1.0000 | ORAL_CAPSULE | Freq: Every day | ORAL | Status: DC

## 2015-03-24 MED ORDER — SODIUM CHLORIDE 0.9 % IV SOLN
INTRAVENOUS | Status: DC
  Administered 2015-03-24: 18:00:00 via INTRAVENOUS

## 2015-03-24 MED ORDER — ONDANSETRON HCL 4 MG/2ML IJ SOLN
4.0000 mg | Freq: Once | INTRAMUSCULAR | Status: AC
  Administered 2015-03-24: 4 mg via INTRAVENOUS
  Filled 2015-03-24: qty 2

## 2015-03-24 MED ORDER — AMLODIPINE BESYLATE 5 MG PO TABS
5.0000 mg | ORAL_TABLET | Freq: Every day | ORAL | Status: DC
  Administered 2015-03-24 – 2015-04-01 (×9): 5 mg via ORAL
  Filled 2015-03-24 (×9): qty 1

## 2015-03-24 MED ORDER — PIPERACILLIN-TAZOBACTAM 4.5 G IVPB
4.5000 g | Freq: Once | INTRAVENOUS | Status: DC
  Filled 2015-03-24: qty 100

## 2015-03-24 MED ORDER — LORAZEPAM 0.5 MG PO TABS
0.5000 mg | ORAL_TABLET | Freq: Every evening | ORAL | Status: DC | PRN
Start: 2015-03-24 — End: 2015-04-01
  Administered 2015-03-24 – 2015-03-30 (×7): 0.5 mg via ORAL
  Filled 2015-03-24 (×7): qty 1

## 2015-03-24 MED ORDER — METOPROLOL TARTRATE 25 MG PO TABS
25.0000 mg | ORAL_TABLET | Freq: Two times a day (BID) | ORAL | Status: DC
Start: 2015-03-24 — End: 2015-04-01
  Administered 2015-03-24 – 2015-04-01 (×16): 25 mg via ORAL
  Filled 2015-03-24 (×15): qty 1

## 2015-03-24 MED ORDER — ACETAMINOPHEN 325 MG PO TABS
650.0000 mg | ORAL_TABLET | Freq: Four times a day (QID) | ORAL | Status: DC | PRN
  Administered 2015-03-28: 650 mg via ORAL
  Filled 2015-03-24: qty 2

## 2015-03-24 MED ORDER — SERTRALINE HCL 50 MG PO TABS
50.0000 mg | ORAL_TABLET | Freq: Every day | ORAL | Status: DC
  Administered 2015-03-24 – 2015-04-01 (×9): 50 mg via ORAL
  Filled 2015-03-24 (×9): qty 1

## 2015-03-24 MED ORDER — IOHEXOL 300 MG/ML  SOLN
50.0000 mL | Freq: Once | INTRAMUSCULAR | Status: AC | PRN
  Administered 2015-03-24: 50 mL via ORAL

## 2015-03-24 MED ORDER — ACETAMINOPHEN 650 MG RE SUPP: 650.0000 mg | Freq: Four times a day (QID) | RECTAL | Status: DC | PRN

## 2015-03-24 NOTE — Consult Note (Signed)
Reason for Consult: Diverticulitis and diarrhea Referring Physician: [facility]  Jolayne Haines HPI: This is a 81 year old female with a PMH of hepatic cysts, PUD, HTN, colonic polyps, and hyperlipidemia admitted with persistent diarrhea.  She was evaluated by Dr. Collene Mares 10 days ago and she was treated with cipro and metronidazole for a diverticulitis, emperically.  Her pain did improve, but she continued to have diarrhea after seven days of treatment.  Unfortunately her symptoms did not resolved and she presented to the ER today.  The CT scan revealed a mild diverticulitis in the sigmoid colon and her hepatic and renal cysts.  In the ER her creatinine was elevated at 7.1 and a BUN of 40.  Her last colonoscopy with Dr. Collene Mares was on 02/05/2011 with findings of diverticula and polyps.  Past Medical History  Diagnosis Date  . PUD (peptic ulcer disease) 1987    with h pylori  . Hepatic cyst   . Migraines   . Hypertension   . Hyperlipemia   . Colonic polyp   . Diverticulosis   . Helicobacter pylori gastritis 1987    Past Surgical History  Procedure Laterality Date  . Abdominal hysterectomy      BSO ; endometriosis; ? Appendectomy incidentally  . Cholecystectomy  2008  . Rotator cuff repair      right  . Colonoscopy  2003 & 2012    polyps; Dr Collene Mares  . Endoscopy gastritis  2008  . Tonsillectomy and adenoidectomy      Family History  Problem Relation Age of Onset  . Asthma Brother   . Hypertension Father   . Heart attack Father 74  . Leukemia Mother   . Stomach cancer Maternal Aunt   . Breast cancer Sister   . Thyroid disease Sister   . Stroke Neg Hx   . Diabetes Neg Hx     Social History:  reports that she quit smoking about 41 years ago. She has never used smokeless tobacco. She reports that she drinks alcohol. She reports that she does not use illicit drugs.  Allergies:  Allergies  Allergen Reactions  . Mavik [Trandolapril]     02/22/15 cough    Medications:   Scheduled:  Continuous: . piperacillin-tazobactam (ZOSYN)  IV      Results for orders placed or performed during the hospital encounter of 03/24/15 (from the past 24 hour(s))  Urinalysis, Routine w reflex microscopic     Status: Abnormal   Collection Time: 03/24/15  8:34 AM  Result Value Ref Range   Color, Urine YELLOW YELLOW   APPearance CLEAR CLEAR   Specific Gravity, Urine 1.005 1.005 - 1.030   pH 5.5 5.0 - 8.0   Glucose, UA NEGATIVE NEGATIVE mg/dL   Hgb urine dipstick NEGATIVE NEGATIVE   Bilirubin Urine NEGATIVE NEGATIVE   Ketones, ur NEGATIVE NEGATIVE mg/dL   Protein, ur NEGATIVE NEGATIVE mg/dL   Urobilinogen, UA 0.2 0.0 - 1.0 mg/dL   Nitrite NEGATIVE NEGATIVE   Leukocytes, UA SMALL (A) NEGATIVE  Urine microscopic-add on     Status: None   Collection Time: 03/24/15  8:34 AM  Result Value Ref Range   Squamous Epithelial / LPF RARE RARE   WBC, UA 3-6 <3 WBC/hpf   RBC / HPF 0-2 <3 RBC/hpf   Bacteria, UA RARE RARE  CBC with Differential/Platelet     Status: Abnormal   Collection Time: 03/24/15 10:30 AM  Result Value Ref Range   WBC 10.4 4.0 - 10.5 K/uL  RBC 3.91 3.87 - 5.11 MIL/uL   Hemoglobin 11.9 (L) 12.0 - 15.0 g/dL   HCT 33.7 (L) 36.0 - 46.0 %   MCV 86.2 78.0 - 100.0 fL   MCH 30.4 26.0 - 34.0 pg   MCHC 35.3 30.0 - 36.0 g/dL   RDW 13.1 11.5 - 15.5 %   Platelets 342 150 - 400 K/uL   Neutrophils Relative % 81 (H) 43 - 77 %   Neutro Abs 8.5 (H) 1.7 - 7.7 K/uL   Lymphocytes Relative 9 (L) 12 - 46 %   Lymphs Abs 1.0 0.7 - 4.0 K/uL   Monocytes Relative 9 3 - 12 %   Monocytes Absolute 0.9 0.1 - 1.0 K/uL   Eosinophils Relative 1 0 - 5 %   Eosinophils Absolute 0.1 0.0 - 0.7 K/uL   Basophils Relative 0 0 - 1 %   Basophils Absolute 0.0 0.0 - 0.1 K/uL  Comprehensive metabolic panel     Status: Abnormal   Collection Time: 03/24/15 10:30 AM  Result Value Ref Range   Sodium 124 (L) 135 - 145 mmol/L   Potassium 3.5 3.5 - 5.1 mmol/L   Chloride 91 (L) 96 - 112 mmol/L    CO2 20 19 - 32 mmol/L   Glucose, Bld 108 (H) 70 - 99 mg/dL   BUN 40 (H) 6 - 23 mg/dL   Creatinine, Ser 7.10 (H) 0.50 - 1.10 mg/dL   Calcium 8.0 (L) 8.4 - 10.5 mg/dL   Total Protein 6.5 6.0 - 8.3 g/dL   Albumin 3.5 3.5 - 5.2 g/dL   AST 20 0 - 37 U/L   ALT 18 0 - 35 U/L   Alkaline Phosphatase 77 39 - 117 U/L   Total Bilirubin 0.7 0.3 - 1.2 mg/dL   GFR calc non Af Amer 5 (L) >90 mL/min   GFR calc Af Amer 6 (L) >90 mL/min   Anion gap 13 5 - 15  Lipase, blood     Status: Abnormal   Collection Time: 03/24/15 10:30 AM  Result Value Ref Range   Lipase 142 (H) 11 - 59 U/L     Ct Abdomen Pelvis Wo Contrast  03/24/2015   CLINICAL DATA:  Lower abdominal pain, diarrhea and vomiting for 4 days.  EXAM: CT ABDOMEN AND PELVIS WITHOUT CONTRAST  TECHNIQUE: Multidetector CT imaging of the abdomen and pelvis was performed following the standard protocol without IV contrast.  COMPARISON:  CT abdomen and pelvis 04/15/2007.  FINDINGS: Linear atelectasis or scar is seen in the lung base cyst. No pleural or pericardial effusion.  Multiple cystic lesions are identified in the liver. The largest is in the right hepatic lobe measuring 11.5 cm transverse by 9.5 cm AP by 9.7 cm craniocaudal, increased from 9.8 cm transverse by 7.0 cm AP by 7.5 cm craniocaudal. An exophytic cyst off the upper pole of the left kidney measures 3.3 x 3.0 cm in the axial plane compared to 2.2 x 1.5 cm on the prior examination. The cyst has Hounsfield unit measurements of 22.7. Parapelvic cysts are also seen bilaterally. In the lower pole of the right kidney, there is a lesion containing calcifications measuring 2.7 cm in diameter which is unchanged. The adrenal glands, spleen, pancreas and biliary tree all appear normal  A small fat containing umbilical hernia is identified. The patient has extensive sigmoid diverticulosis. There is mild stranding about the sigmoid colon most notable in its mid segment. The walls of the sigmoid colon are  thickened. No abscess  or perforation is identified. The colon is otherwise unremarkable. The appendix is not visualized and may have been removed. Small hiatal hernia is noted. The stomach is otherwise unremarkable. The small bowel appears normal. Scattered aortoiliac atherosclerosis without aneurysm is identified. No lymphadenopathy or fluid is seen.  No lytic or sclerotic bony lesion is identified.  IMPRESSION: Extensive sigmoid diverticulosis with findings compatible with superimposed diverticulitis. No abscess or perforation.  Multiple hepatic and bilateral renal cyst. Some of these have increased in size but no aggressive features are identified.  Aortoiliac atherosclerosis without aneurysm.   Electronically Signed   By: Inge Rise M.D.   On: 03/24/2015 13:02    ROS:  As stated above in the HPI otherwise negative.  Blood pressure 151/62, pulse 73, temperature 98.8 F (37.1 C), temperature source Oral, resp. rate 17, height 5\' 5"  (1.651 m), weight 79.379 kg (175 lb), SpO2 97 %.    PE: Gen: NAD, Alert and Oriented HEENT:  [place]/AT, EOMI Neck: Supple, no LAD Lungs: CTA Bilaterally CV: RRR without M/G/R ABM: Soft, NTND, +BS Ext: No C/C/E  Assessment/Plan: 1) Diverticulitis. 2) Diarrhea. 3) ARF. 4) Dehydration.   She is stable and hopefully with aggressive IV hydration her creatinine will decrease.  She needs to be checked for C. Diff despite being on metronidazole.  Concurrent medications such as Cipro and prolong the C. Diff infection.  Currently she is on Zosyn, however, her abdomen is rather benign.    Plan: 1) Aggressive IV hydration per Renal. 2) Okay with antibiotic for now. 3) Check stool pathogens.  Kensie Susman D 03/24/2015, 4:17 PM

## 2015-03-24 NOTE — ED Notes (Signed)
US at bedside

## 2015-03-24 NOTE — ED Notes (Signed)
Pt c/o abdominal pain, diarrhea, and emesis x 4 days.  Denies pain.  Pt reports abdominal pain has resolved.  Pt thinks that she was started on an antibiotic x  6-7 days ago for diverticulitis.

## 2015-03-24 NOTE — ED Provider Notes (Signed)
CSN: [id number]     Arrival date & time 03/24/15  6440 History   First MD Initiated Contact with Patient 03/24/15 [phone]     Chief Complaint  Patient presents with  . Diarrhea  . Emesis     HPI  She presents for evaluation of abdominal pain and diarrhea.    She's seen by Dr. Collene Mares, her gastroenterologist last week on Thursday with a clinical diagnosis of diverticulitis. Placed on Cipro. Diarrhea is persisted several days of vomiting. Abdominal pain is waxed and waned consistent diarrhea each day. No blood pus or mucus. No fevers chills. Weak and dizzy today and presents here.  Past Medical History  Diagnosis Date  . PUD (peptic ulcer disease) 1987    with h pylori  . Hepatic cyst   . Migraines   . Hypertension   . Hyperlipemia   . Colonic polyp   . Diverticulosis   . Helicobacter pylori gastritis 1987   Past Surgical History  Procedure Laterality Date  . Abdominal hysterectomy      BSO ; endometriosis; ? Appendectomy incidentally  . Cholecystectomy  2008  . Rotator cuff repair      right  . Colonoscopy  2003 & 2012    polyps; Dr Collene Mares  . Endoscopy gastritis  2008  . Tonsillectomy and adenoidectomy     Family History  Problem Relation Age of Onset  . Asthma Brother   . Hypertension Father   . Heart attack Father 59  . Leukemia Mother   . Stomach cancer Maternal Aunt   . Breast cancer Sister   . Thyroid disease Sister   . Stroke Neg Hx   . Diabetes Neg Hx    History  Substance Use Topics  . Smoking status: Former Smoker    Quit date: 12/10/1973  . Smokeless tobacco: Never Used  . Alcohol Use: 0.0 oz/week    0 Standard drinks or equivalent per week     Comment:  socially   OB History    No data available     Review of Systems  Constitutional: Negative for fever, chills, diaphoresis, appetite change and fatigue.  HENT: Negative for mouth sores, sore throat and trouble swallowing.   Eyes: Negative for visual disturbance.  Respiratory: Negative for cough,  chest tightness, shortness of breath and wheezing.   Cardiovascular: Negative for chest pain.  Gastrointestinal: Positive for nausea, abdominal pain and diarrhea. Negative for vomiting and abdominal distention.  Endocrine: Negative for polydipsia, polyphagia and polyuria.  Genitourinary: Negative for dysuria, frequency and hematuria.  Musculoskeletal: Negative for gait problem.  Skin: Negative for color change, pallor and rash.  Neurological: Negative for dizziness, syncope, light-headedness and headaches.  Hematological: Does not bruise/bleed easily.  Psychiatric/Behavioral: Negative for behavioral problems and confusion.      Allergies  Mavik  Home Medications   Prior to Admission medications   Medication Sig Start Date End Date Taking? Authorizing Provider  aspirin 81 MG tablet Take 81 mg by mouth daily.   Yes Historical Provider, MD  cholestyramine Lucrezia Starch) 4 GM/DOSE powder Take 0.5 packets (2 g total) by mouth daily. 11/23/13  Yes Hendricks Limes, MD  ciprofloxacin (CIPRO) 500 MG tablet Take 500 mg by mouth every 12 (twelve) hours. 03/15/15  Yes Historical Provider, MD  Coenzyme Q10 300 MG CAPS Take 1 capsule by mouth daily.    Yes Historical Provider, MD  cycloSPORINE (RESTASIS) 0.05 % ophthalmic emulsion Place 1 drop into both eyes 2 (two) times daily.  Yes Historical Provider, MD  docusate sodium (COLACE) 100 MG capsule Take 100 mg by mouth 2 (two) times daily.   Yes Historical Provider, MD  donepezil (ARICEPT) 5 MG tablet Take 1 tablet (5 mg total) by mouth at bedtime. 03/21/15  Yes Hendricks Limes, MD  hydrALAZINE (APRESOLINE) 25 MG tablet TAKE 1 TABLET (25 MG TOTAL) BY MOUTH 3 (THREE) TIMES DAILY. 02/14/15  Yes Hendricks Limes, MD  hydrochlorothiazide (HYDRODIURIL) 25 MG tablet Take 0.5 tablets (12.5 mg total) by mouth daily. TAKE 1 TABLET EVERY DAY Patient taking differently: Take 12.5 mg by mouth daily.  11/23/13  Yes Hendricks Limes, MD  LORazepam (ATIVAN) 0.5 MG  tablet Take 1 tablet (0.5 mg total) by mouth at bedtime as needed. Patient taking differently: Take 0.5 mg by mouth at bedtime as needed for anxiety or sleep.  08/18/14  Yes Hendricks Limes, MD  losartan (COZAAR) 100 MG tablet Take 1 tablet (100 mg total) by mouth daily. 02/22/15  Yes Hendricks Limes, MD  magnesium 30 MG tablet Take 30 mg by mouth daily.    Yes Historical Provider, MD  metoprolol tartrate (LOPRESSOR) 25 MG tablet Take 1 tablet (25 mg total) by mouth 2 (two) times daily. 06/03/14  Yes Hendricks Limes, MD  metroNIDAZOLE (FLAGYL) 250 MG tablet Take 250 mg by mouth 3 (three) times daily. 03/15/15  Yes Historical Provider, MD  omeprazole (PRILOSEC) 20 MG capsule Take 1 capsule by mouth daily as needed (heartburn).  10/13/13  Yes Historical Provider, MD  Probiotic Product (RESTORA) CAPS Take 1 capsule by mouth daily.    Yes Historical Provider, MD  sertraline (ZOLOFT) 50 MG tablet Take 1 tablet (50 mg total) by mouth daily. 08/18/14  Yes Hendricks Limes, MD  tiZANidine (ZANAFLEX) 2 MG tablet Take 1 tablet (2 mg total) by mouth every 8 (eight) hours as needed for muscle spasms. 11/23/13  Yes Hendricks Limes, MD  traMADol (ULTRAM) 50 MG tablet 1/2-1 every 8 hrs prn pain Patient taking differently: Take 25-50 mg by mouth every 6 (six) hours as needed for moderate pain.  08/18/14  Yes Hendricks Limes, MD   BP 151/62 mmHg  Pulse 73  Temp(Src) 98.8 F (37.1 C) (Oral)  Resp 17  Ht 5\' 5"  (1.651 m)  Wt 175 lb (79.379 kg)  BMI 29.12 kg/m2  SpO2 97% Physical Exam  Constitutional: She is oriented to person, place, and time. She appears well-developed and well-nourished. No distress.  HENT:  Head: Normocephalic.  Eyes: Conjunctivae are normal. Pupils are equal, round, and reactive to light. No scleral icterus.  Neck: Normal range of motion. Neck supple. No thyromegaly present.  Cardiovascular: Normal rate and regular rhythm.  Exam reveals no gallop and no friction rub.   No murmur  heard. Pulmonary/Chest: Effort normal and breath sounds normal. No respiratory distress. She has no wheezes. She has no rales.  Abdominal: Soft. Bowel sounds are normal. She exhibits no distension. There is no tenderness. There is no rebound.    Musculoskeletal: Normal range of motion.  Neurological: She is alert and oriented to person, place, and time.  Skin: Skin is warm and dry. No rash noted.  Psychiatric: She has a normal mood and affect. Her behavior is normal.    ED Course  Procedures (including critical care time) Labs Review Labs Reviewed  CBC WITH DIFFERENTIAL/PLATELET - Abnormal; Notable for the following:    Hemoglobin 11.9 (*)    HCT 33.7 (*)  Neutrophils Relative % 81 (*)    Neutro Abs 8.5 (*)    Lymphocytes Relative 9 (*)    All other components within normal limits  COMPREHENSIVE METABOLIC PANEL - Abnormal; Notable for the following:    Sodium 124 (*)    Chloride 91 (*)    Glucose, Bld 108 (*)    BUN 40 (*)    Creatinine, Ser 7.10 (*)    Calcium 8.0 (*)    GFR calc non Af Amer 5 (*)    GFR calc Af Amer 6 (*)    All other components within normal limits  LIPASE, BLOOD - Abnormal; Notable for the following:    Lipase 142 (*)    All other components within normal limits  URINALYSIS, ROUTINE W REFLEX MICROSCOPIC - Abnormal; Notable for the following:    Leukocytes, UA SMALL (*)    All other components within normal limits  CLOSTRIDIUM DIFFICILE BY PCR  URINE MICROSCOPIC-ADD ON    Imaging Review Ct Abdomen Pelvis Wo Contrast  03/24/2015   CLINICAL DATA:  Lower abdominal pain, diarrhea and vomiting for 4 days.  EXAM: CT ABDOMEN AND PELVIS WITHOUT CONTRAST  TECHNIQUE: Multidetector CT imaging of the abdomen and pelvis was performed following the standard protocol without IV contrast.  COMPARISON:  CT abdomen and pelvis 04/15/2007.  FINDINGS: Linear atelectasis or scar is seen in the lung base cyst. No pleural or pericardial effusion.  Multiple cystic lesions are  identified in the liver. The largest is in the right hepatic lobe measuring 11.5 cm transverse by 9.5 cm AP by 9.7 cm craniocaudal, increased from 9.8 cm transverse by 7.0 cm AP by 7.5 cm craniocaudal. An exophytic cyst off the upper pole of the left kidney measures 3.3 x 3.0 cm in the axial plane compared to 2.2 x 1.5 cm on the prior examination. The cyst has Hounsfield unit measurements of 22.7. Parapelvic cysts are also seen bilaterally. In the lower pole of the right kidney, there is a lesion containing calcifications measuring 2.7 cm in diameter which is unchanged. The adrenal glands, spleen, pancreas and biliary tree all appear normal  A small fat containing umbilical hernia is identified. The patient has extensive sigmoid diverticulosis. There is mild stranding about the sigmoid colon most notable in its mid segment. The walls of the sigmoid colon are thickened. No abscess or perforation is identified. The colon is otherwise unremarkable. The appendix is not visualized and may have been removed. Small hiatal hernia is noted. The stomach is otherwise unremarkable. The small bowel appears normal. Scattered aortoiliac atherosclerosis without aneurysm is identified. No lymphadenopathy or fluid is seen.  No lytic or sclerotic bony lesion is identified.  IMPRESSION: Extensive sigmoid diverticulosis with findings compatible with superimposed diverticulitis. No abscess or perforation.  Multiple hepatic and bilateral renal cyst. Some of these have increased in size but no aggressive features are identified.  Aortoiliac atherosclerosis without aneurysm.   Electronically Signed   By: Inge Rise M.D.   On: 03/24/2015 13:02     EKG Interpretation None      MDM   Final diagnoses:  AP (abdominal pain)  Diverticulitis of large intestine without perforation or abscess without bleeding    Labs show marked azotemia. Elevation of BUN to a lesser degree. With recent vomiting diarrhea hopefully this is  prerenal. We'll require admission for this. CT scan done noncontrast. Shows diverticulitis without perforation or abscess. Discussed with hospitalist. Given additional IV fluids. Zosyn ordered. I requested.    Elta Guadeloupe  Jeneen Rinks, MD 03/24/15 [phone]

## 2015-03-24 NOTE — ED Notes (Signed)
Patient transported to CT 

## 2015-03-24 NOTE — Progress Notes (Signed)
ANTIBIOTIC CONSULT NOTE - INITIAL  Pharmacy Consult for zosyn Indication: Intra-abdominal Infection  Allergies  Allergen Reactions  . Mavik [Trandolapril]     02/22/15 cough    Patient Measurements: Height: 5\' 5"  (165.1 cm) Weight: 175 lb (79.379 kg) IBW/kg (Calculated) : 57   Vital Signs: Temp: 98.8 F (37.1 C) (04/14 1140) Temp Source: Oral (04/14 1140) BP: 151/62 mmHg (04/14 1500) Pulse Rate: 73 (04/14 1500) Intake/Output from previous day:   Intake/Output from this shift:    Labs:  Recent Labs  03/24/15 1030  WBC 10.4  HGB 11.9*  PLT 342  CREATININE 7.10*   Estimated Creatinine Clearance: 6.7 mL/min (by C-G formula based on Cr of 7.1). No results for input(s): VANCOTROUGH, VANCOPEAK, VANCORANDOM, GENTTROUGH, GENTPEAK, GENTRANDOM, TOBRATROUGH, TOBRAPEAK, TOBRARND, AMIKACINPEAK, AMIKACINTROU, AMIKACIN in the last 72 hours.   Microbiology: No results found for this or any previous visit (from the past 720 hour(s)).  Medical History: Past Medical History  Diagnosis Date  . PUD (peptic ulcer disease) 1987    with h pylori  . Hepatic cyst   . Migraines   . Hypertension   . Hyperlipemia   . Colonic polyp   . Diverticulosis   . Helicobacter pylori gastritis 1987    Medications:  See med rec  Assessment: Patient is a 81 y.o F with recent diagnosis of diverticulitis last week on flagyl and cipro PTA (started on 4/5, for 30 days).  He presented to the ED with c/o abd pain, diarrhea, and emesis. Abd CT showed "Extensive sigmoid diverticulosis with findings compatible with superimposed diverticulitis. No abscess or perforation."  To start zosyn for broad coverage. - scr elevated at 7.10 (est crcl~7), last scr was 0.9 on 08/17/14.  Patient reported that she does not have a hx of renal disease.  Acute kidney failure may be due to dehydration secondary to GI issues.  Goal of Therapy:  Eradication of infection  Plan:  - zosyn 2.25 gm IV q8h - monitor renal  function closely and adjust if/when appropriate  Jahden Schara P 03/24/2015,3:31 PM

## 2015-03-24 NOTE — ED Notes (Signed)
Patient has been made aware that a urine sample is needed, but patient denies needing to use restroom at this time.

## 2015-03-24 NOTE — Progress Notes (Signed)
Pt arrived from ED on stretcher, slid self to bed. Up to [facility] w/ one assist. Anxious re: persistent diarrhea. Reviewed POC and MD orders w/ pt. BP elevated, will give scheduled Norvasc and recheck once pt calmer. Pt oriented to callbell and environment and enteric precautions.

## 2015-03-24 NOTE — ED Notes (Addendum)
Aware urine sample is needed. Unable to go at this time. Also aware fecal sample is needed. Took Imodium this morning.

## 2015-03-24 NOTE — ED Notes (Signed)
Patient transported to CT. Phenergan just arrived from pharmacy. Patient aware medication will be given upon return from CT.

## 2015-03-24 NOTE — H&P (Signed)
[facility] History and Physical  Toni [name] VQQ:[id number] DOB: 1934/10/05 DOA: 03/24/2015  Referring physician:  PCP: Unice Cobble, MD   Chief Complaint: Generalized weakness  HPI: Toni [name] is a 81 y.o. female with a past medical history of diverticulosis, hypertension, dyslipidemia, who presents to the emergency department with complaints of generalized weakness and ongoing diarrhea. She was diagnosed with acute diverticulitis by her gastroenterologist Dr Collene Mares about 10 days ago where she was started on empiric oral antibiotic therapy with ciprofloxacin and Flagyl. Although she reports improvement to her left lower quadrant abdominal pain she has continued to have ongoing episodes of nonbloody liquid consistency diarrhea associated with nausea and vomiting along with decreased by mouth intake. She takes hydrochlorothiazide along with losartan 100 mg by mouth daily. She continued to feel weak and fatigued and presented to the emergency room where lab work revealed  a creatinine of 7.1 with BUN of 40. She denies hematemesis, fevers, chills, chest pain, shortness of breath, dysuria, hematuria . case was discussed with Dr. Marval Regal of Nephrology and Dr Collene Mares of GI.                                                                                                                                                                                                                                                                                                                                                                                         Review of Systems:  Constitutional:  No weight loss, night sweats, Fevers, chills, positive for malaise, fatigue.  HEENT:  No headaches, Difficulty swallowing,Tooth/dental problems,Sore throat,  No sneezing, itching, ear ache, nasal congestion, post nasal drip,  Cardio-vascular:  No chest pain, Orthopnea, PND, swelling in lower extremities,  anasarca, dizziness, palpitations  GI:  No heartburn, indigestion, abdominal pain, nausea, vomiting, diarrhea, change in bowel habits, loss of appetite  Resp:  No shortness of breath with exertion or at rest. No excess mucus, no productive cough, No non-productive cough, No coughing up of blood.No change in color of mucus.No wheezing.No chest wall deformity  Skin:  no rash or lesions.  GU:  no dysuria, change in color of urine, no urgency or frequency. No flank pain.  Musculoskeletal:  No joint pain or swelling. No decreased range of motion. No back pain.  Psych:  No change in mood or affect. No depression or anxiety. No memory loss.   Past Medical History  Diagnosis Date  . PUD (peptic ulcer disease) 1987    with h pylori  . Hepatic cyst   . Migraines   . Hypertension   . Hyperlipemia   . Colonic polyp   . Diverticulosis   . Helicobacter pylori gastritis 1987   Past Surgical History  Procedure Laterality Date  . Abdominal hysterectomy      BSO ; endometriosis; ? Appendectomy incidentally  . Cholecystectomy  2008  . Rotator cuff repair      right  . Colonoscopy  2003 & 2012    polyps; Dr Collene Mares  . Endoscopy gastritis  2008  . Tonsillectomy and adenoidectomy     Social History:  reports that she quit smoking about 41 years ago. She has never used smokeless tobacco. She reports that she drinks alcohol. She reports that she does not use illicit drugs.  Allergies  Allergen Reactions  . Mavik [Trandolapril]     02/22/15 cough    Family History  Problem Relation Age of Onset  . Asthma Brother   . Hypertension Father   . Heart attack Father 14  . Leukemia Mother   . Stomach cancer Maternal Aunt   . Breast cancer Sister   . Thyroid disease Sister   . Stroke Neg Hx   . Diabetes Neg Hx    *  Prior to Admission medications   Medication Sig Start Date End Date Taking? Authorizing Provider  aspirin 81 MG tablet Take 81 mg by mouth daily.   Yes Historical Provider, MD    cholestyramine Lucrezia Starch) 4 GM/DOSE powder Take 0.5 packets (2 g total) by mouth daily. 11/23/13  Yes Hendricks Limes, MD  ciprofloxacin (CIPRO) 500 MG tablet Take 500 mg by mouth every 12 (twelve) hours. 03/15/15  Yes Historical Provider, MD  Coenzyme Q10 300 MG CAPS Take 1 capsule by mouth daily.    Yes Historical Provider, MD  cycloSPORINE (RESTASIS) 0.05 % ophthalmic emulsion Place 1 drop into both eyes 2 (two) times daily.    Yes Historical Provider, MD  docusate sodium (COLACE) 100 MG capsule Take 100 mg by mouth 2 (two) times daily.   Yes Historical Provider, MD  donepezil (ARICEPT) 5 MG tablet Take 1 tablet (5 mg total) by mouth at bedtime. 03/21/15  Yes Hendricks Limes, MD  hydrALAZINE (APRESOLINE) 25 MG tablet TAKE 1 TABLET (25 MG TOTAL) BY MOUTH 3 (THREE) TIMES DAILY. 02/14/15  Yes Hendricks Limes, MD  hydrochlorothiazide (HYDRODIURIL) 25 MG tablet Take 0.5 tablets (12.5 mg total) by mouth daily. TAKE 1 TABLET EVERY DAY Patient taking differently: Take 12.5 mg by mouth daily.  11/23/13  Yes Hendricks Limes, MD  LORazepam (ATIVAN) 0.5 MG tablet Take 1 tablet (0.5 mg total) by  mouth at bedtime as needed. Patient taking differently: Take 0.5 mg by mouth at bedtime as needed for anxiety or sleep.  08/18/14  Yes Hendricks Limes, MD  losartan (COZAAR) 100 MG tablet Take 1 tablet (100 mg total) by mouth daily. 02/22/15  Yes Hendricks Limes, MD  magnesium 30 MG tablet Take 30 mg by mouth daily.    Yes Historical Provider, MD  metoprolol tartrate (LOPRESSOR) 25 MG tablet Take 1 tablet (25 mg total) by mouth 2 (two) times daily. 06/03/14  Yes Hendricks Limes, MD  metroNIDAZOLE (FLAGYL) 250 MG tablet Take 250 mg by mouth 3 (three) times daily. 03/15/15  Yes Historical Provider, MD  omeprazole (PRILOSEC) 20 MG capsule Take 1 capsule by mouth daily as needed (heartburn).  10/13/13  Yes Historical Provider, MD  Probiotic Product (RESTORA) CAPS Take 1 capsule by mouth daily.    Yes Historical Provider, MD   sertraline (ZOLOFT) 50 MG tablet Take 1 tablet (50 mg total) by mouth daily. 08/18/14  Yes Hendricks Limes, MD  tiZANidine (ZANAFLEX) 2 MG tablet Take 1 tablet (2 mg total) by mouth every 8 (eight) hours as needed for muscle spasms. 11/23/13  Yes Hendricks Limes, MD  traMADol (ULTRAM) 50 MG tablet 1/2-1 every 8 hrs prn pain Patient taking differently: Take 25-50 mg by mouth every 6 (six) hours as needed for moderate pain.  08/18/14  Yes Hendricks Limes, MD   Physical Exam: Filed Vitals:   03/24/15 1140 03/24/15 1333 03/24/15 1400 03/24/15 1500  BP: 154/61 153/68 140/57 151/62  Pulse: 67 78 64 73  Temp: 98.8 F (37.1 C)     TempSrc: Oral     Resp: 17 16  17   Height:      Weight:      SpO2: 99% 98% 97% 97%    Wt Readings from Last 3 Encounters:  03/24/15 79.379 kg (175 lb)  03/22/15 85.276 kg (188 lb)  02/22/15 83.031 kg (183 lb 0.8 oz)    General:  Appears calm and comfortable, she does not appear to be in acute distress, awake and alert, providing history. Eyes: PERRL, normal lids, irises & conjunctiva ENT: grossly normal hearing, lips & tongue Neck: no LAD, masses or thyromegaly Cardiovascular: Tachycardic RRR, no m/r/g. No LE edema. Telemetry: SR, no arrhythmias  Respiratory: CTA bilaterally, no w/r/r. Normal respiratory effort. Abdomen: soft, she has ongoing tenderness to palpation over her left lower quadrant, no rebound tenderness or guarding, positive bowel sounds in all 4 quadrants Skin: no rash or induration seen on limited exam Musculoskeletal: grossly normal tone BUE/BLE Psychiatric: grossly normal mood and affect, speech fluent and appropriate Neurologic: grossly non-focal.          Labs on Admission:  Basic Metabolic Panel:  Recent Labs Lab 03/24/15 1030  NA 124*  K 3.5  CL 91*  CO2 20  GLUCOSE 108*  BUN 40*  CREATININE 7.10*  CALCIUM 8.0*   Liver Function Tests:  Recent Labs Lab 03/24/15 1030  AST 20  ALT 18  ALKPHOS 77  BILITOT 0.7  PROT  6.5  ALBUMIN 3.5    Recent Labs Lab 03/24/15 1030  LIPASE 142*   No results for input(s): AMMONIA in the last 168 hours. CBC:  Recent Labs Lab 03/24/15 1030  WBC 10.4  NEUTROABS 8.5*  HGB 11.9*  HCT 33.7*  MCV 86.2  PLT 342   Cardiac Enzymes: No results for input(s): CKTOTAL, CKMB, CKMBINDEX, TROPONINI in the last 168 hours.  BNP (last 3 results) No results for input(s): BNP in the last 8760 hours.  ProBNP (last 3 results) No results for input(s): PROBNP in the last 8760 hours.  CBG: No results for input(s): GLUCAP in the last 168 hours.  Radiological Exams on Admission: Ct Abdomen Pelvis Wo Contrast  03/24/2015   CLINICAL DATA:  Lower abdominal pain, diarrhea and vomiting for 4 days.  EXAM: CT ABDOMEN AND PELVIS WITHOUT CONTRAST  TECHNIQUE: Multidetector CT imaging of the abdomen and pelvis was performed following the standard protocol without IV contrast.  COMPARISON:  CT abdomen and pelvis 04/15/2007.  FINDINGS: Linear atelectasis or scar is seen in the lung base cyst. No pleural or pericardial effusion.  Multiple cystic lesions are identified in the liver. The largest is in the right hepatic lobe measuring 11.5 cm transverse by 9.5 cm AP by 9.7 cm craniocaudal, increased from 9.8 cm transverse by 7.0 cm AP by 7.5 cm craniocaudal. An exophytic cyst off the upper pole of the left kidney measures 3.3 x 3.0 cm in the axial plane compared to 2.2 x 1.5 cm on the prior examination. The cyst has Hounsfield unit measurements of 22.7. Parapelvic cysts are also seen bilaterally. In the lower pole of the right kidney, there is a lesion containing calcifications measuring 2.7 cm in diameter which is unchanged. The adrenal glands, spleen, pancreas and biliary tree all appear normal  A small fat containing umbilical hernia is identified. The patient has extensive sigmoid diverticulosis. There is mild stranding about the sigmoid colon most notable in its mid segment. The walls of the sigmoid  colon are thickened. No abscess or perforation is identified. The colon is otherwise unremarkable. The appendix is not visualized and may have been removed. Small hiatal hernia is noted. The stomach is otherwise unremarkable. The small bowel appears normal. Scattered aortoiliac atherosclerosis without aneurysm is identified. No lymphadenopathy or fluid is seen.  No lytic or sclerotic bony lesion is identified.  IMPRESSION: Extensive sigmoid diverticulosis with findings compatible with superimposed diverticulitis. No abscess or perforation.  Multiple hepatic and bilateral renal cyst. Some of these have increased in size but no aggressive features are identified.  Aortoiliac atherosclerosis without aneurysm.   Electronically Signed   By: Inge Rise M.D.   On: 03/24/2015 13:02    EKG: Independently reviewed.   Assessment/Plan Principal Problem:   Diverticulitis Active Problems:   AKI (acute kidney injury)   Diverticulosis of large intestine   Essential hypertension   Dehydration   Hyponatremia   1. Acute diverticulitis. Patient having a history of extensive diverticulosis diagnosed with acute diverticulitis by her gastroenterologist about 10 days ago and treated with ciprofloxacin and Flagyl in the outpatient setting. In the emergency room she had a repeat CT scan that showed extensive sigmoid diverticulosis with findings compatible with superimposed diverticulitis. Radiology reporting no evidence of abscess or perforation. Will stop Cipro and Flagyl and start her on IV Zosyn with pharmacy consultation for renal dosing. Provide aggressive IV fluid resuscitation, supportive care, follow-up on cultures. Case was discussed with her gastroenterologist Dr.Mann.  2. Acute kidney injury. Patient reporting multiple episodes of nausea vomiting associate with diarrhea with a past week. Lab work in the emergency room revealed a creatinine of 7.1 with BUN of 40. Looking back at records she had a creatinine  of 0.9 with BUN of 13 on 08/18/2014. This is likely related to profound dehydration and prerenal azotemia. She was not noted to be hypotensive in the emergency room. She had been  taking losartan and hydrochlorothiazide at home as well. Case was discussed with Dr Marval Regal of nephrology who will consult. Provide IV fluid resuscitation with normal saline, discontinue nephrotoxic medications, repeat labs in a.m. 3. Hyponatremia. Lab work showing a sodium of 124 which I suspect is likely related to hypotonic hypovolemic hyponatremia in setting of nausea vomiting and diarrhea as well as diuretic therapy. As mentioned above providing IV fluid resuscitation, repeat labs in a.m. 4. Diarrhea. Patient reporting having multiple episodes of nonbloody diarrhea over the past week. Given recent antibiotic use will check a stool for C. difficile along with stool cultures. Meanwhile place patient under enteric precautions. Providing IV fluid resuscitation. 5. Hypertension. As mentioned above will discontinue ARB and diuretic therapy for the presence of acute kidney injury with labs showing creatinine of 7. In the emergency room she had systolic blood pressures in the 150s. Will start Norvasc 5 mg by mouth daily.  6. DVT prophylaxis: Subcutaneous heparin    Code Status: Full code Family Communication: Spoke to her husband who was present at bedside Disposition Plan: Will admit patient to the step down unit for close monitoring  Time spent: 70 minutes  Kelvin Cellar [facility] Pager [phone]

## 2015-03-24 NOTE — Consult Note (Signed)
Reason for Consult:ARF Referring Physician: Coralyn Pear, MD  Toni [name] is an 81 y.o. female.  HPI: Pt is a 81yo F with PMH sig for HTN and dementia who was diagnosed with diverticulitis a week ago and started on cipro and flagyl who presents to [facility] ED with h/o N/V/D/Abdominal pain.  Workup has revealed BUN of 40 and Scr of 7.1 (last known Scr value was 0.9 as seen below).  Of note, patient has been taking an ARB prior to admission but denies any NSAIDs/Cox-II I's or IV contrast.  She denies any previous history of kidney disease nor family history (including PCKD).    Trend in Creatinine: CREATININE, SER  Date/Time Value Ref Range Status  03/24/2015 10:30 AM 7.10* 0.50 - 1.10 mg/dL Final  08/18/2014 10:19 AM 0.9 0.4 - 1.2 mg/dL Final  11/02/2013 11:07 AM 0.9 0.4 - 1.2 mg/dL Final  11/05/2012 12:36 PM 0.9 0.4 - 1.2 mg/dL Final  11/19/2011 01:05 PM 1.0 0.4 - 1.2 mg/dL Final  08/10/2011 11:29 AM 0.8 0.4 - 1.2 mg/dL Final  03/01/2009 10:13 AM 0.9 0.4-1.2 mg/dL Final  03/12/2008 08:47 AM 1.0 0.4-1.2 mg/dL Final  06/11/2007 04:21 PM 0.88  Final    PMH:   Past Medical History  Diagnosis Date  . PUD (peptic ulcer disease) 1987    with h pylori  . Hepatic cyst   . Migraines   . Hypertension   . Hyperlipemia   . Colonic polyp   . Diverticulosis   . Helicobacter pylori gastritis 1987    PSH:   Past Surgical History  Procedure Laterality Date  . Abdominal hysterectomy      BSO ; endometriosis; ? Appendectomy incidentally  . Cholecystectomy  2008  . Rotator cuff repair      right  . Colonoscopy  2003 & 2012    polyps; Dr Collene Mares  . Endoscopy gastritis  2008  . Tonsillectomy and adenoidectomy      Allergies:  Allergies  Allergen Reactions  . Mavik [Trandolapril]     02/22/15 cough    Medications:   Prior to Admission medications   Medication Sig Start Date End Date Taking? Authorizing Provider  aspirin 81 MG tablet Take 81 mg by mouth daily.   Yes Historical Provider, MD   cholestyramine Lucrezia Starch) 4 GM/DOSE powder Take 0.5 packets (2 g total) by mouth daily. 11/23/13  Yes Hendricks Limes, MD  ciprofloxacin (CIPRO) 500 MG tablet Take 500 mg by mouth every 12 (twelve) hours. 03/15/15  Yes Historical Provider, MD  Coenzyme Q10 300 MG CAPS Take 1 capsule by mouth daily.    Yes Historical Provider, MD  cycloSPORINE (RESTASIS) 0.05 % ophthalmic emulsion Place 1 drop into both eyes 2 (two) times daily.    Yes Historical Provider, MD  docusate sodium (COLACE) 100 MG capsule Take 100 mg by mouth 2 (two) times daily.   Yes Historical Provider, MD  donepezil (ARICEPT) 5 MG tablet Take 1 tablet (5 mg total) by mouth at bedtime. 03/21/15  Yes Hendricks Limes, MD  hydrALAZINE (APRESOLINE) 25 MG tablet TAKE 1 TABLET (25 MG TOTAL) BY MOUTH 3 (THREE) TIMES DAILY. 02/14/15  Yes Hendricks Limes, MD  hydrochlorothiazide (HYDRODIURIL) 25 MG tablet Take 0.5 tablets (12.5 mg total) by mouth daily. TAKE 1 TABLET EVERY DAY Patient taking differently: Take 12.5 mg by mouth daily.  11/23/13  Yes Hendricks Limes, MD  LORazepam (ATIVAN) 0.5 MG tablet Take 1 tablet (0.5 mg total) by mouth at bedtime as needed.  Patient taking differently: Take 0.5 mg by mouth at bedtime as needed for anxiety or sleep.  08/18/14  Yes Hendricks Limes, MD  losartan (COZAAR) 100 MG tablet Take 1 tablet (100 mg total) by mouth daily. 02/22/15  Yes Hendricks Limes, MD  magnesium 30 MG tablet Take 30 mg by mouth daily.    Yes Historical Provider, MD  metoprolol tartrate (LOPRESSOR) 25 MG tablet Take 1 tablet (25 mg total) by mouth 2 (two) times daily. 06/03/14  Yes Hendricks Limes, MD  metroNIDAZOLE (FLAGYL) 250 MG tablet Take 250 mg by mouth 3 (three) times daily. 03/15/15  Yes Historical Provider, MD  omeprazole (PRILOSEC) 20 MG capsule Take 1 capsule by mouth daily as needed (heartburn).  10/13/13  Yes Historical Provider, MD  Probiotic Product (RESTORA) CAPS Take 1 capsule by mouth daily.    Yes Historical Provider, MD   sertraline (ZOLOFT) 50 MG tablet Take 1 tablet (50 mg total) by mouth daily. 08/18/14  Yes Hendricks Limes, MD  tiZANidine (ZANAFLEX) 2 MG tablet Take 1 tablet (2 mg total) by mouth every 8 (eight) hours as needed for muscle spasms. 11/23/13  Yes Hendricks Limes, MD  traMADol (ULTRAM) 50 MG tablet 1/2-1 every 8 hrs prn pain Patient taking differently: Take 25-50 mg by mouth every 6 (six) hours as needed for moderate pain.  08/18/14  Yes Hendricks Limes, MD    Inpatient medications:    Discontinued Meds:   Medications Discontinued During This Encounter  Medication Reason  . Fish Oil-Cholecalciferol (FISH OIL + D3) 1000-1000 MG-UNIT CAPS Patient Preference  . promethazine (PHENERGAN) 25 MG tablet Completed Course  . piperacillin-tazobactam (ZOSYN) IVPB 4.5 g     Social History:  reports that she quit smoking about 41 years ago. She has never used smokeless tobacco. She reports that she drinks alcohol. She reports that she does not use illicit drugs.  Family History:   Family History  Problem Relation Age of Onset  . Asthma Brother   . Hypertension Father   . Heart attack Father 22  . Leukemia Mother   . Stomach cancer Maternal Aunt   . Breast cancer Sister   . Thyroid disease Sister   . Stroke Neg Hx   . Diabetes Neg Hx     A comprehensive review of systems was negative except for: Gastrointestinal: positive for abdominal pain, diarrhea, nausea and vomiting Weight change:  No intake or output data in the 24 hours ending 03/24/15 1558 BP 151/62 mmHg  Pulse 73  Temp(Src) 98.8 F (37.1 C) (Oral)  Resp 17  Ht 5\' 5"  (1.651 m)  Wt 79.379 kg (175 lb)  BMI 29.12 kg/m2  SpO2 97% Filed Vitals:   03/24/15 1140 03/24/15 1333 03/24/15 1400 03/24/15 1500  BP: 154/61 153/68 140/57 151/62  Pulse: 67 78 64 73  Temp: 98.8 F (37.1 C)     TempSrc: Oral     Resp: 17 16  17   Height:      Weight:      SpO2: 99% 98% 97% 97%     General appearance: alert, cooperative and no  distress Head: Normocephalic, without obvious abnormality, atraumatic Eyes: negative findings: lids and lashes normal, conjunctivae and sclerae normal and corneas clear Neck: no adenopathy, no carotid bruit, no JVD, supple, symmetrical, trachea midline and thyroid not enlarged, symmetric, no tenderness/mass/nodules Resp: clear to auscultation bilaterally Cardio: regular rate and rhythm, S1, S2 normal, no murmur, click, rub or gallop GI: distended, normoactive bowel  sounds, no guarding, rebound, or tenderness Extremities: edema trace pretibial   Labs: Basic Metabolic Panel:  Recent Labs Lab 03/24/15 1030  NA 124*  K 3.5  CL 91*  CO2 20  GLUCOSE 108*  BUN 40*  CREATININE 7.10*  ALBUMIN 3.5  CALCIUM 8.0*   Liver Function Tests:  Recent Labs Lab 03/24/15 1030  AST 20  ALT 18  ALKPHOS 77  BILITOT 0.7  PROT 6.5  ALBUMIN 3.5    Recent Labs Lab 03/24/15 1030  LIPASE 142*   No results for input(s): AMMONIA in the last 168 hours. CBC:  Recent Labs Lab 03/24/15 1030  WBC 10.4  NEUTROABS 8.5*  HGB 11.9*  HCT 33.7*  MCV 86.2  PLT 342   PT/INR: @LABRCNTIP (inr:5) Cardiac Enzymes: )No results for input(s): CKTOTAL, CKMB, CKMBINDEX, TROPONINI in the last 168 hours. CBG: No results for input(s): GLUCAP in the last 168 hours.  Iron Studies: No results for input(s): IRON, TIBC, TRANSFERRIN, FERRITIN in the last 168 hours.  Xrays/Other Studies: Ct Abdomen Pelvis Wo Contrast  03/24/2015   CLINICAL DATA:  Lower abdominal pain, diarrhea and vomiting for 4 days.  EXAM: CT ABDOMEN AND PELVIS WITHOUT CONTRAST  TECHNIQUE: Multidetector CT imaging of the abdomen and pelvis was performed following the standard protocol without IV contrast.  COMPARISON:  CT abdomen and pelvis 04/15/2007.  FINDINGS: Linear atelectasis or scar is seen in the lung base cyst. No pleural or pericardial effusion.  Multiple cystic lesions are identified in the liver. The largest is in the right hepatic  lobe measuring 11.5 cm transverse by 9.5 cm AP by 9.7 cm craniocaudal, increased from 9.8 cm transverse by 7.0 cm AP by 7.5 cm craniocaudal. An exophytic cyst off the upper pole of the left kidney measures 3.3 x 3.0 cm in the axial plane compared to 2.2 x 1.5 cm on the prior examination. The cyst has Hounsfield unit measurements of 22.7. Parapelvic cysts are also seen bilaterally. In the lower pole of the right kidney, there is a lesion containing calcifications measuring 2.7 cm in diameter which is unchanged. The adrenal glands, spleen, pancreas and biliary tree all appear normal  A small fat containing umbilical hernia is identified. The patient has extensive sigmoid diverticulosis. There is mild stranding about the sigmoid colon most notable in its mid segment. The walls of the sigmoid colon are thickened. No abscess or perforation is identified. The colon is otherwise unremarkable. The appendix is not visualized and may have been removed. Small hiatal hernia is noted. The stomach is otherwise unremarkable. The small bowel appears normal. Scattered aortoiliac atherosclerosis without aneurysm is identified. No lymphadenopathy or fluid is seen.  No lytic or sclerotic bony lesion is identified.  IMPRESSION: Extensive sigmoid diverticulosis with findings compatible with superimposed diverticulitis. No abscess or perforation.  Multiple hepatic and bilateral renal cyst. Some of these have increased in size but no aggressive features are identified.  Aortoiliac atherosclerosis without aneurysm.   Electronically Signed   By: Inge Rise M.D.   On: 03/24/2015 13:02   US Renal  03/24/2015   CLINICAL DATA:  Acute renal failure  EXAM: RENAL/URINARY TRACT ULTRASOUND COMPLETE  COMPARISON:  CT abdomen pelvis dated 03/24/2015.  FINDINGS: Right Kidney:  Length: 12.8 cm. 4.0 x 4.5 x 3.7 cm interpolar simple cyst. 2.2 x 2.2 x 2.2 cm calcified lesion in the renal sinus, poorly evaluated on ultrasound. This lesion measured  2.7 cm on the current CT, previously 2.7 cm in 2008 and 2.0 cm in  2002. No hydronephrosis.  Left Kidney:  Length: 12.8 cm. Two upper pole simple cysts, measuring 2.9 x 2.9 x 2.4 cm and 2.6 x 2.8 x 2.3 cm. No hydronephrosis.  Bladder:  Within normal limits.  IMPRESSION: 2.2 cm calcified lesion in the right renal sinus, better visualized on CT, where it measured 2.7 cm. Lesion is unchanged in size since 2008 and therefore likely benign. If definitive characterization is desired, consider follow-up MRI with contrast when renal function improves.  Additional simple bilateral renal cysts, as above.  No hydronephrosis.   Electronically Signed   By: Julian Hy M.D.   On: 03/24/2015 17:14     Assessment/Plan: 1. ARF- most likely ischemic ATN in the setting of N/V/D and ARB therapy.  Also on the differential diagnosis would be acute interstitial nephritis possibly related to cipro (although she denies any fever or rash). 1. Renal US without hydro 2. Check FeNa, urine Eos, CPK levels, will hold off on serologies as her urine was bland except for a few WBC's. 3. Renal dose medications 2. Hyponatremia- most consistent with volume depletion and ARF.  Follow with IVF's 3. Metabolic acidosis- due to #1 and vomiting.  Will follow with IVF's.  Will hold off on bicarb for now. 4. Diarrhea- agree with need to r/o C. Diff 5. Diverticulitis- per GI and primary svc. 6. Hepatic and renal cysts- no family h/o PCKD 7. HTN- hold losartan and HCTZ and follow.  Continue with amlodipine and metoprolol 8. Peripheral edema- likely related to amlodipine as she does not have proteinuria. 9. Hypokalemia- as above, cont to follow.   Emmanuelle Coxe A 03/24/2015, 3:58 PM

## 2015-03-25 LAB — COMPREHENSIVE METABOLIC PANEL
ALBUMIN: 3.4 g/dL — AB (ref 3.5–5.2)
ALK PHOS: 80 U/L (ref 39–117)
ALT: 15 U/L (ref 0–35)
AST: 21 U/L (ref 0–37)
Anion gap: 10 (ref 5–15)
BILIRUBIN TOTAL: 0.7 mg/dL (ref 0.3–1.2)
BUN: 39 mg/dL — AB (ref 6–23)
CHLORIDE: 99 mmol/L (ref 96–112)
CO2: 17 mmol/L — ABNORMAL LOW (ref 19–32)
CREATININE: 6.84 mg/dL — AB (ref 0.50–1.10)
Calcium: 7.7 mg/dL — ABNORMAL LOW (ref 8.4–10.5)
GFR calc Af Amer: 6 mL/min — ABNORMAL LOW (ref >90)
GFR calc non Af Amer: 5 mL/min — ABNORMAL LOW (ref >90)
GLUCOSE: 113 mg/dL — AB (ref 70–99)
POTASSIUM: 3.7 mmol/L (ref 3.5–5.1)
Sodium: 126 mmol/L — ABNORMAL LOW (ref 135–145)
Total Protein: 6.4 g/dL (ref 6.0–8.3)

## 2015-03-25 LAB — RENAL FUNCTION PANEL
ALBUMIN: 3.4 g/dL — AB (ref 3.5–5.2)
ANION GAP: 11 (ref 5–15)
BUN: 39 mg/dL — ABNORMAL HIGH (ref 6–23)
CO2: 17 mmol/L — AB (ref 19–32)
Calcium: 7.4 mg/dL — ABNORMAL LOW (ref 8.4–10.5)
Chloride: 99 mmol/L (ref 96–112)
Creatinine, Ser: 6.9 mg/dL — ABNORMAL HIGH (ref 0.50–1.10)
GFR, EST AFRICAN AMERICAN: 6 mL/min — AB (ref >90)
GFR, EST NON AFRICAN AMERICAN: 5 mL/min — AB (ref >90)
GLUCOSE: 114 mg/dL — AB (ref 70–99)
POTASSIUM: 3.7 mmol/L (ref 3.5–5.1)
Phosphorus: 6.4 mg/dL — ABNORMAL HIGH (ref 2.3–4.6)
SODIUM: 127 mmol/L — AB (ref 135–145)

## 2015-03-25 LAB — CBC
HCT: 34.4 % — ABNORMAL LOW (ref 36.0–46.0)
Hemoglobin: 12 g/dL (ref 12.0–15.0)
MCH: 30.4 pg (ref 26.0–34.0)
MCHC: 34.9 g/dL (ref 30.0–36.0)
MCV: 87.1 fL (ref 78.0–100.0)
Platelets: 362 10*3/uL (ref 150–400)
RBC: 3.95 MIL/uL (ref 3.87–5.11)
RDW: 13 % (ref 11.5–15.5)
WBC: 10.3 10*3/uL (ref 4.0–10.5)

## 2015-03-25 MED ORDER — LOPERAMIDE HCL 2 MG PO CAPS
2.0000 mg | ORAL_CAPSULE | Freq: Two times a day (BID) | ORAL | Status: DC | PRN
  Administered 2015-03-25 – 2015-03-26 (×2): 2 mg via ORAL
  Filled 2015-03-25 (×2): qty 1

## 2015-03-25 MED ORDER — STERILE WATER FOR INJECTION IV SOLN
150.0000 meq | INTRAVENOUS | Status: DC
  Administered 2015-03-25 – 2015-03-26 (×3): 150 meq via INTRAVENOUS
  Filled 2015-03-25 (×6): qty 850

## 2015-03-25 NOTE — Progress Notes (Signed)
[facility] ROUNDING NOTE   Subjective:   Interval History: no complaints this morning  Feels better   Objective:  Vital signs in last 24 hours:  Temp:  [97.9 F (36.6 C)-98.8 F (37.1 C)] 97.9 F (36.6 C) (04/15 0400) Pulse Rate:  [58-83] 58 (04/15 0647) Resp:  [14-24] 16 (04/15 0647) BP: (136-197)/(55-85) 136/72 mmHg (04/15 0600) SpO2:  [94 %-99 %] 94 % (04/15 0647) Weight:  [85.4 kg (188 lb 4.4 oz)] 85.4 kg (188 lb 4.4 oz) (04/15 0400)  Weight change:  Filed Weights   03/24/15 0842 03/25/15 0400  Weight: 79.379 kg (175 lb) 85.4 kg (188 lb 4.4 oz)    Intake/Output: I/O last 3 completed shifts: In: 1886.7 [P.O.:220; I.V.:1566.7; IV Piggyback:100] Out: 1200 [Urine:800; Stool:400]   Intake/Output this shift:     CVS- RRR RS- CTA ABD- BS present soft non-distended EXT- no edema   Basic Metabolic Panel:  Recent Labs Lab 03/24/15 1030 03/24/15 1810 03/25/15 0329  NA 124*  --  127*  126*  K 3.5  --  3.7  3.7  CL 91*  --  99  99  CO2 20  --  17*  17*  GLUCOSE 108*  --  114*  113*  BUN 40*  --  39*  39*  CREATININE 7.10* 6.80* 6.90*  6.84*  CALCIUM 8.0*  --  7.4*  7.7*  PHOS  --   --  6.4*    Liver Function Tests:  Recent Labs Lab 03/24/15 1030 03/25/15 0329  AST 20 21  ALT 18 15  ALKPHOS 77 80  BILITOT 0.7 0.7  PROT 6.5 6.4  ALBUMIN 3.5 3.4*  3.4*    Recent Labs Lab 03/24/15 1030  LIPASE 142*   No results for input(s): AMMONIA in the last 168 hours.  CBC:  Recent Labs Lab 03/24/15 1030 03/24/15 1810 03/25/15 0329  WBC 10.4 9.7 10.3  NEUTROABS 8.5*  --   --   HGB 11.9* 11.4* 12.0  HCT 33.7* 32.7* 34.4*  MCV 86.2 86.7 87.1  PLT 342 286 362    Cardiac Enzymes:  Recent Labs Lab 03/24/15 1810  CKTOTAL 82    BNP: Invalid input(s): POCBNP  CBG: No results for input(s): GLUCAP in the last 168 hours.  Microbiology: Results for orders placed or performed during the hospital encounter of 03/24/15   Clostridium Difficile by PCR     Status: None   Collection Time: 03/24/15  5:48 PM  Result Value Ref Range Status   C difficile by pcr NEGATIVE NEGATIVE Final  MRSA PCR Screening     Status: Abnormal   Collection Time: 03/24/15  5:48 PM  Result Value Ref Range Status   MRSA by PCR INVALID RESULTS, SPECIMEN SENT FOR CULTURE (A) NEGATIVE Final    Comment:        The GeneXpert MRSA Assay (FDA approved for NASAL specimens only), is one component of a comprehensive MRSA colonization surveillance program. It is not intended to diagnose MRSA infection nor to guide or monitor treatment for MRSA infections. MAY,B/2W @2110  ON 03/24/15 BY KARCZEWSKI,S.     Coagulation Studies: No results for input(s): LABPROT, INR in the last 72 hours.  Urinalysis:  Recent Labs  03/24/15 0834  COLORURINE YELLOW  LABSPEC 1.005  PHURINE 5.5  GLUCOSEU NEGATIVE  HGBUR NEGATIVE  BILIRUBINUR NEGATIVE  KETONESUR NEGATIVE  PROTEINUR NEGATIVE  UROBILINOGEN 0.2  NITRITE NEGATIVE  LEUKOCYTESUR SMALL*      Imaging: Ct Abdomen Pelvis Wo Contrast  03/24/2015   CLINICAL DATA:  Lower abdominal pain, diarrhea and vomiting for 4 days.  EXAM: CT ABDOMEN AND PELVIS WITHOUT CONTRAST  TECHNIQUE: Multidetector CT imaging of the abdomen and pelvis was performed following the standard protocol without IV contrast.  COMPARISON:  CT abdomen and pelvis 04/15/2007.  FINDINGS: Linear atelectasis or scar is seen in the lung base cyst. No pleural or pericardial effusion.  Multiple cystic lesions are identified in the liver. The largest is in the right hepatic lobe measuring 11.5 cm transverse by 9.5 cm AP by 9.7 cm craniocaudal, increased from 9.8 cm transverse by 7.0 cm AP by 7.5 cm craniocaudal. An exophytic cyst off the upper pole of the left kidney measures 3.3 x 3.0 cm in the axial plane compared to 2.2 x 1.5 cm on the prior examination. The cyst has Hounsfield unit measurements of 22.7. Parapelvic cysts are also seen  bilaterally. In the lower pole of the right kidney, there is a lesion containing calcifications measuring 2.7 cm in diameter which is unchanged. The adrenal glands, spleen, pancreas and biliary tree all appear normal  A small fat containing umbilical hernia is identified. The patient has extensive sigmoid diverticulosis. There is mild stranding about the sigmoid colon most notable in its mid segment. The walls of the sigmoid colon are thickened. No abscess or perforation is identified. The colon is otherwise unremarkable. The appendix is not visualized and may have been removed. Small hiatal hernia is noted. The stomach is otherwise unremarkable. The small bowel appears normal. Scattered aortoiliac atherosclerosis without aneurysm is identified. No lymphadenopathy or fluid is seen.  No lytic or sclerotic bony lesion is identified.  IMPRESSION: Extensive sigmoid diverticulosis with findings compatible with superimposed diverticulitis. No abscess or perforation.  Multiple hepatic and bilateral renal cyst. Some of these have increased in size but no aggressive features are identified.  Aortoiliac atherosclerosis without aneurysm.   Electronically Signed   By: Inge Rise M.D.   On: 03/24/2015 13:02   US Renal  03/24/2015   CLINICAL DATA:  Acute renal failure  EXAM: RENAL/URINARY TRACT ULTRASOUND COMPLETE  COMPARISON:  CT abdomen pelvis dated 03/24/2015.  FINDINGS: Right Kidney:  Length: 12.8 cm. 4.0 x 4.5 x 3.7 cm interpolar simple cyst. 2.2 x 2.2 x 2.2 cm calcified lesion in the renal sinus, poorly evaluated on ultrasound. This lesion measured 2.7 cm on the current CT, previously 2.7 cm in 2008 and 2.0 cm in 2002. No hydronephrosis.  Left Kidney:  Length: 12.8 cm. Two upper pole simple cysts, measuring 2.9 x 2.9 x 2.4 cm and 2.6 x 2.8 x 2.3 cm. No hydronephrosis.  Bladder:  Within normal limits.  IMPRESSION: 2.2 cm calcified lesion in the right renal sinus, better visualized on CT, where it measured 2.7 cm.  Lesion is unchanged in size since 2008 and therefore likely benign. If definitive characterization is desired, consider follow-up MRI with contrast when renal function improves.  Additional simple bilateral renal cysts, as above.  No hydronephrosis.   Electronically Signed   By: Julian Hy M.D.   On: 03/24/2015 17:14     Medications:   .  sodium bicarbonate 150 mEq in sterile water 1000 mL infusion     . acidophilus  1 capsule Oral Daily  . amLODipine  5 mg Oral Daily  . antiseptic oral rinse  7 mL Mouth Rinse BID  . cycloSPORINE  1 drop Both Eyes BID  . donepezil  5 mg Oral QHS  . heparin  5,000 Units  Subcutaneous 3 times per day  . metoprolol tartrate  25 mg Oral BID  . piperacillin-tazobactam (ZOSYN)  IV  2.25 g Intravenous Q8H  . sertraline  50 mg Oral Daily  . sodium chloride  3 mL Intravenous Q12H   acetaminophen **OR** acetaminophen, alum & mag hydroxide-simeth, LORazepam, ondansetron **OR** ondansetron (ZOFRAN) IV, oxyCODONE  Assessment/ Plan:   Acute renal failure  Most likely ATN   Urinalysis benign and renal ultrasound negative  Hyponatremia  U Na  11   Most likely secondary volume depletion  Metabolic acidosis start IV bicarbonate  Diverticulitis - Antibiotics  HTN/volume continue to replete volume   LOS: 1 Ta Fair W @TODAY @10 :04 AM

## 2015-03-25 NOTE — Progress Notes (Addendum)
Patient ID: Toni [name], female   DOB: [birth year], 81 y.o.   MRN: [id number]  [facility] PROGRESS NOTE  SHAKTI FLEER ASN:[id number] DOB: 1934-04-16 DOA: 03/24/2015 PCP: Unice Cobble, MD   Brief narrative:    81 y.o. female history of diverticulosis, HTN, HLD, presented to emergency department with main concern of progressively worsening weakness and ongoing diarrhea. She was diagnosed with acute diverticulitis by Dr. Collene Mares about 10 days prior to this admission and was started on empiric ciprofloxacin and Flagyl. Patient explained she felt initially better with less abdominal pain but diarrhea persisted and she started to develop more nausea resulting in poor oral intake.  In emergency department, patient noted to be hemodynamically stable, blood work notable for acute renal failure with creatinine 7 and BUN 40. Tried hospitalist asked to admit for further evaluation. Nephrologist and gastroenterologist has been consulted for assistance.   Assessment/Plan:    Principal Problem:   Acute on chronic diverticulitis - Patient appears stable this morning, reports feeling better but still with nausea - Appreciate gastroenterology team following - Plan is to advance diet as patient able to tolerate and continue IV antibiotics Zosyn day 2 - Plan on transitioning to by mouth antibiotics once patient able to tolerate oral intake better Active Problems:   Acute renal failure - Likely progression of prerenal etiology to ATN - Urinalysis unremarkable, renal ultrasound unremarkable - Continue IV fluids as patient is responding well, Cr is trending down - Plan to repeat electrolyte panel in the morning - Appreciate nephrologist recommendations and assistance   Hyponatremia - Likely from prerenal etiology in the setting of  hypovolemia - Sodium level improving - Continue IV fluids as noted above and repeat BMP in the morning   Metabolic acidosis - Plan to start IV bicarbonate per nephrologist recommendation   Essential hypertension - Add hydralazine as needed for better blood pressure control - continue Norvasc and Metoprolol     Non severe protein calorie malnutrition - Nutrition is consulted, appreciate recommendations  DVT prophylaxis - Heparin SQ   Code Status: Full.  Family Communication:  plan of care discussed with the patient Disposition Plan: Transfer to telemetry bed   IV access:  Peripheral IV  Procedures and diagnostic studies:    Ct Abdomen Pelvis Wo Contrast  03/24/2015  Extensive sigmoid diverticulosis with findings compatible with superimposed diverticulitis. No abscess or perforation.  Multiple hepatic and bilateral renal cyst. Some of these have increased in size but no aggressive features are identified.  Aortoiliac atherosclerosis without aneurysm.     US Renal  03/24/2015  2.2 cm calcified lesion in the right renal sinus, better visualized on CT, where it measured 2.7 cm. Lesion is unchanged in size since 2008 and therefore likely benign. If definitive characterization is desired, consider follow-up MRI with contrast when renal function improves.  Additional simple bilateral renal cysts, as above.  No hydronephrosis.     Medical Consultants:  GI Nephrology   Other Consultants:  None   IAnti-Infectives:   Zosyn 4/14 -->  [name], MD  [facility] Pager [phone]  If 7PM-7AM, please contact night-coverage [email or web address] Password [email or web address] 03/25/2015, 1:28 PM   LOS: 1 day   HPI/Subjective: No events overnight. Still with nausea.   Objective: Filed Vitals:   03/25/15 0647 03/25/15 0800 03/25/15 1000 03/25/15 1200  BP:  169/62 154/61 172/68  Pulse: 58 63 59 68  Temp:      TempSrc:      Resp: 16 16 16 24   Height:  Weight:      SpO2: 94% 96% 95% 96%     Intake/Output Summary (Last 24 hours) at 03/25/15 1328 Last data filed at 03/25/15 0830  Gross per 24 hour  Intake 1886.67 ml  Output   1500 ml  Net 386.67 ml    Exam:   General:  Pt is alert, follows commands appropriately, not in acute distress  Cardiovascular: Regular rate and rhythm, , no rubs, no gallops  Respiratory: Clear to auscultation bilaterally, no wheezing, no crackles, no rhonchi  Abdomen: Soft, non tender, non distended, bowel sounds present, no guarding  Extremities: pulses DP and PT palpable bilaterally  Neuro: Grossly nonfocal  Data Reviewed: Basic Metabolic Panel:  Recent Labs Lab 03/24/15 1030 03/24/15 1810 03/25/15 0329  NA 124*  --  127*  126*  K 3.5  --  3.7  3.7  CL 91*  --  99  99  CO2 20  --  17*  17*  GLUCOSE 108*  --  114*  113*  BUN 40*  --  39*  39*  CREATININE 7.10* 6.80* 6.90*  6.84*  CALCIUM 8.0*  --  7.4*  7.7*  PHOS  --   --  6.4*   Liver Function Tests:  Recent Labs Lab 03/24/15 1030 03/25/15 0329  AST 20 21  ALT 18 15  ALKPHOS 77 80  BILITOT 0.7 0.7  PROT 6.5 6.4  ALBUMIN 3.5 3.4*  3.4*    Recent Labs Lab 03/24/15 1030  LIPASE 142*   CBC:  Recent Labs Lab 03/24/15 1030 03/24/15 1810 03/25/15 0329  WBC 10.4 9.7 10.3  NEUTROABS 8.5*  --   --   HGB 11.9* 11.4* 12.0  HCT 33.7* 32.7* 34.4*  MCV 86.2 86.7 87.1  PLT 342 286 362   Cardiac Enzymes:  Recent Labs Lab 03/24/15 1810  CKTOTAL 82    Recent Results (from the past 240 hour(s))  Clostridium Difficile by PCR     Status: None   Collection Time: 03/24/15  5:48 PM  Result Value Ref Range Status   C difficile by pcr NEGATIVE NEGATIVE Final  MRSA PCR Screening     Status: Abnormal   Collection Time: 03/24/15  5:48 PM  Result Value Ref Range Status   MRSA by PCR INVALID RESULTS, SPECIMEN SENT FOR CULTURE (A) NEGATIVE Final     Scheduled Meds: . acidophilus  1 capsule Oral Daily  . amLODipine  5 mg Oral Daily  . antiseptic oral  rinse  7 mL Mouth Rinse BID  . cycloSPORINE  1 drop Both Eyes BID  . donepezil  5 mg Oral QHS  . heparin  5,000 Units Subcutaneous 3 times per day  . metoprolol tartrate  25 mg Oral BID  . piperacillin-tazobactam (ZOSYN)  IV  2.25 g Intravenous Q8H  . sertraline  50 mg Oral Daily  . sodium chloride  3 mL Intravenous Q12H   Continuous Infusions: .  sodium bicarbonate 150 mEq in sterile water 1000 mL infusion 150 mEq (03/25/15 1200)

## 2015-03-25 NOTE — Progress Notes (Signed)
CARE MANAGEMENT NOTE 03/25/2015  Patient:  Toni [name], Toni [name]   Account Number:  [id number]  Date Initiated:  03/25/2015  Documentation initiated by:  DAVIS,RHONDA  Subjective/Objective Assessment:   aki due to dehydration     Action/Plan:   home when stable   Anticipated DC Date:  03/28/2015   Anticipated DC Plan:  HOME/SELF CARE  In-house referral  NA      DC Planning Services  CM consult      Choice offered to / List presented to:             Status of service:  In process, will continue to follow Medicare Important Message given?   (If response is "NO", the following Medicare IM given date fields will be blank) Date Medicare IM given:   Medicare IM given by:   Date Additional Medicare IM given:   Additional Medicare IM given by:    Discharge Disposition:    Per UR Regulation:  Reviewed for med. necessity/level of care/duration of stay  If discussed at [place] of Stay Meetings, dates discussed:    Comments:  March 25, 2015/Rhonda L. Rosana Hoes, RN, BSN, CCM. Case Management [place] [phone] No discharge needs present of time of review.

## 2015-03-25 NOTE — Progress Notes (Signed)
Subjective: Feeling better.  One more formed bowel movement.  No diarrhea.  Objective: Vital signs in last 24 hours: Temp:  [97.9 F (36.6 C)-98.6 F (37 C)] 97.9 F (36.6 C) (04/15 0400) Pulse Rate:  [58-83] 68 (04/15 1200) Resp:  [14-24] 24 (04/15 1200) BP: (136-197)/(55-85) 172/68 mmHg (04/15 1200) SpO2:  [94 %-99 %] 96 % (04/15 1200) Weight:  [85.4 kg (188 lb 4.4 oz)] 85.4 kg (188 lb 4.4 oz) (04/15 0400) Last BM Date: 03/24/15  Intake/Output from previous day: 04/14 0701 - 04/15 0700 In: 1886.7 [P.O.:220; I.V.:1566.7; IV Piggyback:100] Out: 1200 [Urine:800; Stool:400] Intake/Output this shift: Total I/O In: -  Out: 300 [Urine:300]  General appearance: alert and no distress GI: soft, non-tender; bowel sounds normal; no masses,  no organomegaly  Lab Results:  Recent Labs  03/24/15 1030 03/24/15 1810 03/25/15 0329  WBC 10.4 9.7 10.3  HGB 11.9* 11.4* 12.0  HCT 33.7* 32.7* 34.4*  PLT 342 286 362   BMET  Recent Labs  03/24/15 1030 03/24/15 1810 03/25/15 0329  NA 124*  --  127*  126*  K 3.5  --  3.7  3.7  CL 91*  --  99  99  CO2 20  --  17*  17*  GLUCOSE 108*  --  114*  113*  BUN 40*  --  39*  39*  CREATININE 7.10* 6.80* 6.90*  6.84*  CALCIUM 8.0*  --  7.4*  7.7*   LFT  Recent Labs  03/25/15 0329  PROT 6.4  ALBUMIN 3.4*  3.4*  AST 21  ALT 15  ALKPHOS 80  BILITOT 0.7   PT/INR No results for input(s): LABPROT, INR in the last 72 hours. Hepatitis Panel No results for input(s): HEPBSAG, HCVAB, HEPAIGM, HEPBIGM in the last 72 hours. C-Diff No results for input(s): CDIFFTOX in the last 72 hours. Fecal Lactopherrin No results for input(s): FECLLACTOFRN in the last 72 hours.  Studies/Results: Ct Abdomen Pelvis Wo Contrast  03/24/2015   CLINICAL DATA:  Lower abdominal pain, diarrhea and vomiting for 4 days.  EXAM: CT ABDOMEN AND PELVIS WITHOUT CONTRAST  TECHNIQUE: Multidetector CT imaging of the abdomen and pelvis was performed following the  standard protocol without IV contrast.  COMPARISON:  CT abdomen and pelvis 04/15/2007.  FINDINGS: Linear atelectasis or scar is seen in the lung base cyst. No pleural or pericardial effusion.  Multiple cystic lesions are identified in the liver. The largest is in the right hepatic lobe measuring 11.5 cm transverse by 9.5 cm AP by 9.7 cm craniocaudal, increased from 9.8 cm transverse by 7.0 cm AP by 7.5 cm craniocaudal. An exophytic cyst off the upper pole of the left kidney measures 3.3 x 3.0 cm in the axial plane compared to 2.2 x 1.5 cm on the prior examination. The cyst has Hounsfield unit measurements of 22.7. Parapelvic cysts are also seen bilaterally. In the lower pole of the right kidney, there is a lesion containing calcifications measuring 2.7 cm in diameter which is unchanged. The adrenal glands, spleen, pancreas and biliary tree all appear normal  A small fat containing umbilical hernia is identified. The patient has extensive sigmoid diverticulosis. There is mild stranding about the sigmoid colon most notable in its mid segment. The walls of the sigmoid colon are thickened. No abscess or perforation is identified. The colon is otherwise unremarkable. The appendix is not visualized and may have been removed. Small hiatal hernia is noted. The stomach is otherwise unremarkable. The small bowel appears normal. Scattered aortoiliac  atherosclerosis without aneurysm is identified. No lymphadenopathy or fluid is seen.  No lytic or sclerotic bony lesion is identified.  IMPRESSION: Extensive sigmoid diverticulosis with findings compatible with superimposed diverticulitis. No abscess or perforation.  Multiple hepatic and bilateral renal cyst. Some of these have increased in size but no aggressive features are identified.  Aortoiliac atherosclerosis without aneurysm.   Electronically Signed   By: Inge Rise M.D.   On: 03/24/2015 13:02   US Renal  03/24/2015   CLINICAL DATA:  Acute renal failure  EXAM:  RENAL/URINARY TRACT ULTRASOUND COMPLETE  COMPARISON:  CT abdomen pelvis dated 03/24/2015.  FINDINGS: Right Kidney:  Length: 12.8 cm. 4.0 x 4.5 x 3.7 cm interpolar simple cyst. 2.2 x 2.2 x 2.2 cm calcified lesion in the renal sinus, poorly evaluated on ultrasound. This lesion measured 2.7 cm on the current CT, previously 2.7 cm in 2008 and 2.0 cm in 2002. No hydronephrosis.  Left Kidney:  Length: 12.8 cm. Two upper pole simple cysts, measuring 2.9 x 2.9 x 2.4 cm and 2.6 x 2.8 x 2.3 cm. No hydronephrosis.  Bladder:  Within normal limits.  IMPRESSION: 2.2 cm calcified lesion in the right renal sinus, better visualized on CT, where it measured 2.7 cm. Lesion is unchanged in size since 2008 and therefore likely benign. If definitive characterization is desired, consider follow-up MRI with contrast when renal function improves.  Additional simple bilateral renal cysts, as above.  No hydronephrosis.   Electronically Signed   By: Julian Hy M.D.   On: 03/24/2015 17:14    Medications:  Scheduled: . acidophilus  1 capsule Oral Daily  . amLODipine  5 mg Oral Daily  . antiseptic oral rinse  7 mL Mouth Rinse BID  . cycloSPORINE  1 drop Both Eyes BID  . donepezil  5 mg Oral QHS  . heparin  5,000 Units Subcutaneous 3 times per day  . metoprolol tartrate  25 mg Oral BID  . piperacillin-tazobactam (ZOSYN)  IV  2.25 g Intravenous Q8H  . sertraline  50 mg Oral Daily  . sodium chloride  3 mL Intravenous Q12H   Continuous: .  sodium bicarbonate 150 mEq in sterile water 1000 mL infusion 150 mEq (03/25/15 1200)    Assessment/Plan: 1) Diverticulitis. 2) Diarrhea. 3) ARF.    Clinically she is well.  No issues with tolerating Zosyn and her C. Diff is negative.    Plan: 1) Continue with IV hydration. 2) Okay with Zosyn.  LOS: 1 day   Chet Greenley D 03/25/2015, 12:28 PM

## 2015-03-25 NOTE — Progress Notes (Signed)
Patient transferred with belongings, medications, and chart to room1406 with nurse tech.. No new problems at this time.  Sandre Kitty

## 2015-03-26 DIAGNOSIS — E860 Dehydration: Secondary | ICD-10-CM

## 2015-03-26 DIAGNOSIS — N179 Acute kidney failure, unspecified: Secondary | ICD-10-CM

## 2015-03-26 DIAGNOSIS — K5732 Diverticulitis of large intestine without perforation or abscess without bleeding: Secondary | ICD-10-CM

## 2015-03-26 LAB — CBC
HEMATOCRIT: 30 % — AB (ref 36.0–46.0)
Hemoglobin: 10.6 g/dL — ABNORMAL LOW (ref 12.0–15.0)
MCH: 30.5 pg (ref 26.0–34.0)
MCHC: 35.3 g/dL (ref 30.0–36.0)
MCV: 86.2 fL (ref 78.0–100.0)
Platelets: 291 10*3/uL (ref 150–400)
RBC: 3.48 MIL/uL — AB (ref 3.87–5.11)
RDW: 12.8 % (ref 11.5–15.5)
WBC: 7.7 10*3/uL (ref 4.0–10.5)

## 2015-03-26 LAB — RENAL FUNCTION PANEL
ANION GAP: 10 (ref 5–15)
Albumin: 2.7 g/dL — ABNORMAL LOW (ref 3.5–5.2)
BUN: 38 mg/dL — AB (ref 6–23)
CHLORIDE: 96 mmol/L (ref 96–112)
CO2: 25 mmol/L (ref 19–32)
Calcium: 6.9 mg/dL — ABNORMAL LOW (ref 8.4–10.5)
Creatinine, Ser: 7.33 mg/dL — ABNORMAL HIGH (ref 0.50–1.10)
GFR calc Af Amer: 5 mL/min — ABNORMAL LOW (ref >90)
GFR, EST NON AFRICAN AMERICAN: 5 mL/min — AB (ref >90)
Glucose, Bld: 93 mg/dL (ref 70–99)
Phosphorus: 6.2 mg/dL — ABNORMAL HIGH (ref 2.3–4.6)
Potassium: 3.3 mmol/L — ABNORMAL LOW (ref 3.5–5.1)
Sodium: 131 mmol/L — ABNORMAL LOW (ref 135–145)

## 2015-03-26 MED ORDER — SODIUM CHLORIDE 0.9 % IV SOLN
INTRAVENOUS | Status: DC
  Administered 2015-03-26 (×2): via INTRAVENOUS

## 2015-03-26 MED ORDER — VITAMINS A & D EX OINT
TOPICAL_OINTMENT | CUTANEOUS | Status: AC
Start: 2015-03-26 — End: 2015-03-26
  Administered 2015-03-26: 11:00:00
  Filled 2015-03-26: qty 5

## 2015-03-26 MED ORDER — NYSTATIN 100000 UNIT/ML MT SUSP
5.0000 mL | Freq: Four times a day (QID) | OROMUCOSAL | Status: DC
  Administered 2015-03-26 – 2015-04-01 (×25): 500000 [IU] via ORAL
  Filled 2015-03-26 (×27): qty 5

## 2015-03-26 MED ORDER — LOPERAMIDE HCL 2 MG PO CAPS
2.0000 mg | ORAL_CAPSULE | Freq: Four times a day (QID) | ORAL | Status: DC | PRN
  Administered 2015-03-26 – 2015-03-28 (×4): 2 mg via ORAL
  Filled 2015-03-26 (×4): qty 1

## 2015-03-26 MED ORDER — POTASSIUM CHLORIDE IN NACL 40-0.9 MEQ/L-% IV SOLN
INTRAVENOUS | Status: DC
  Administered 2015-03-26: 75 mL/h via INTRAVENOUS
  Filled 2015-03-26 (×4): qty 1000

## 2015-03-26 NOTE — Progress Notes (Signed)
[facility] ROUNDING NOTE   Subjective:   Interval History: doing well today  Just woke  Objective:  Vital signs in last 24 hours:  Temp:  [97.7 F (36.5 C)-98.8 F (37.1 C)] 98.8 F (37.1 C) (04/16 0536) Pulse Rate:  [59-88] 88 (04/16 0536) Resp:  [16-25] 18 (04/16 0536) BP: (128-172)/(55-68) 155/65 mmHg (04/16 0536) SpO2:  [95 %-98 %] 96 % (04/16 0536)  Weight change:  Filed Weights   03/24/15 0842 03/25/15 0400  Weight: 79.379 kg (175 lb) 85.4 kg (188 lb 4.4 oz)    Intake/Output: I/O last 3 completed shifts: In: 3066.7 [P.O.:100; I.V.:2816.7; IV Piggyback:150] Out: 1600 [Urine:1200; Stool:400]   Intake/Output this shift:  Total I/O In: 100 [IV Piggyback:100] Out: -   CVS- RRR RS- CTA ABD- BS present soft non-distended EXT-trace edema   Basic Metabolic Panel:  Recent Labs Lab 03/24/15 1030 03/24/15 1810 03/25/15 0329 03/26/15 0450  NA 124*  --  127*  126* 131*  K 3.5  --  3.7  3.7 3.3*  CL 91*  --  99  99 96  CO2 20  --  17*  17* 25  GLUCOSE 108*  --  114*  113* 93  BUN 40*  --  39*  39* 38*  CREATININE 7.10* 6.80* 6.90*  6.84* 7.33*  CALCIUM 8.0*  --  7.4*  7.7* 6.9*  PHOS  --   --  6.4* 6.2*    Liver Function Tests:  Recent Labs Lab 03/24/15 1030 03/25/15 0329 03/26/15 0450  AST 20 21  --   ALT 18 15  --   ALKPHOS 77 80  --   BILITOT 0.7 0.7  --   PROT 6.5 6.4  --   ALBUMIN 3.5 3.4*  3.4* 2.7*    Recent Labs Lab 03/24/15 1030  LIPASE 142*   No results for input(s): AMMONIA in the last 168 hours.  CBC:  Recent Labs Lab 03/24/15 1030 03/24/15 1810 03/25/15 0329 03/26/15 0450  WBC 10.4 9.7 10.3 7.7  NEUTROABS 8.5*  --   --   --   HGB 11.9* 11.4* 12.0 10.6*  HCT 33.7* 32.7* 34.4* 30.0*  MCV 86.2 86.7 87.1 86.2  PLT 342 286 362 291    Cardiac Enzymes:  Recent Labs Lab 03/24/15 1810  CKTOTAL 82    BNP: Invalid input(s): POCBNP  CBG: No results for input(s): GLUCAP in the last 168  hours.  Microbiology: Results for orders placed or performed during the hospital encounter of 03/24/15  Clostridium Difficile by PCR     Status: None   Collection Time: 03/24/15  5:48 PM  Result Value Ref Range Status   C difficile by pcr NEGATIVE NEGATIVE Final  MRSA PCR Screening     Status: Abnormal   Collection Time: 03/24/15  5:48 PM  Result Value Ref Range Status   MRSA by PCR INVALID RESULTS, SPECIMEN SENT FOR CULTURE (A) NEGATIVE Final    Comment:        The GeneXpert MRSA Assay (FDA approved for NASAL specimens only), is one component of a comprehensive MRSA colonization surveillance program. It is not intended to diagnose MRSA infection nor to guide or monitor treatment for MRSA infections. MAY,B/2W @2110  ON 03/24/15 BY KARCZEWSKI,S.   Culture, blood (routine x 2)     Status: None (Preliminary result)   Collection Time: 03/24/15  6:00 PM  Result Value Ref Range Status   Specimen Description BLOOD R HAND  Final   Special Requests Normal  Final   Culture   Final           BLOOD CULTURE RECEIVED NO GROWTH TO DATE CULTURE WILL BE HELD FOR 5 DAYS BEFORE ISSUING A FINAL NEGATIVE REPORT Performed at [facility]    Report Status PENDING  Incomplete  Culture, blood (routine x 2)     Status: None (Preliminary result)   Collection Time: 03/24/15  6:10 PM  Result Value Ref Range Status   Specimen Description BLOOD R ARM  Final   Special Requests Normal  Final   Culture   Final           BLOOD CULTURE RECEIVED NO GROWTH TO DATE CULTURE WILL BE HELD FOR 5 DAYS BEFORE ISSUING A FINAL NEGATIVE REPORT Performed at [facility]    Report Status PENDING  Incomplete    Coagulation Studies: No results for input(s): LABPROT, INR in the last 72 hours.  Urinalysis:  Recent Labs  03/24/15 0834  COLORURINE YELLOW  LABSPEC 1.005  PHURINE 5.5  GLUCOSEU NEGATIVE  HGBUR NEGATIVE  BILIRUBINUR NEGATIVE  KETONESUR NEGATIVE  PROTEINUR NEGATIVE  UROBILINOGEN 0.2   NITRITE NEGATIVE  LEUKOCYTESUR SMALL*      Imaging: Ct Abdomen Pelvis Wo Contrast  03/24/2015   CLINICAL DATA:  Lower abdominal pain, diarrhea and vomiting for 4 days.  EXAM: CT ABDOMEN AND PELVIS WITHOUT CONTRAST  TECHNIQUE: Multidetector CT imaging of the abdomen and pelvis was performed following the standard protocol without IV contrast.  COMPARISON:  CT abdomen and pelvis 04/15/2007.  FINDINGS: Linear atelectasis or scar is seen in the lung base cyst. No pleural or pericardial effusion.  Multiple cystic lesions are identified in the liver. The largest is in the right hepatic lobe measuring 11.5 cm transverse by 9.5 cm AP by 9.7 cm craniocaudal, increased from 9.8 cm transverse by 7.0 cm AP by 7.5 cm craniocaudal. An exophytic cyst off the upper pole of the left kidney measures 3.3 x 3.0 cm in the axial plane compared to 2.2 x 1.5 cm on the prior examination. The cyst has Hounsfield unit measurements of 22.7. Parapelvic cysts are also seen bilaterally. In the lower pole of the right kidney, there is a lesion containing calcifications measuring 2.7 cm in diameter which is unchanged. The adrenal glands, spleen, pancreas and biliary tree all appear normal  A small fat containing umbilical hernia is identified. The patient has extensive sigmoid diverticulosis. There is mild stranding about the sigmoid colon most notable in its mid segment. The walls of the sigmoid colon are thickened. No abscess or perforation is identified. The colon is otherwise unremarkable. The appendix is not visualized and may have been removed. Small hiatal hernia is noted. The stomach is otherwise unremarkable. The small bowel appears normal. Scattered aortoiliac atherosclerosis without aneurysm is identified. No lymphadenopathy or fluid is seen.  No lytic or sclerotic bony lesion is identified.  IMPRESSION: Extensive sigmoid diverticulosis with findings compatible with superimposed diverticulitis. No abscess or perforation.   Multiple hepatic and bilateral renal cyst. Some of these have increased in size but no aggressive features are identified.  Aortoiliac atherosclerosis without aneurysm.   Electronically Signed   By: Inge Rise M.D.   On: 03/24/2015 13:02   US Renal  03/24/2015   CLINICAL DATA:  Acute renal failure  EXAM: RENAL/URINARY TRACT ULTRASOUND COMPLETE  COMPARISON:  CT abdomen pelvis dated 03/24/2015.  FINDINGS: Right Kidney:  Length: 12.8 cm. 4.0 x 4.5 x 3.7 cm interpolar simple cyst. 2.2 x 2.2  x 2.2 cm calcified lesion in the renal sinus, poorly evaluated on ultrasound. This lesion measured 2.7 cm on the current CT, previously 2.7 cm in 2008 and 2.0 cm in 2002. No hydronephrosis.  Left Kidney:  Length: 12.8 cm. Two upper pole simple cysts, measuring 2.9 x 2.9 x 2.4 cm and 2.6 x 2.8 x 2.3 cm. No hydronephrosis.  Bladder:  Within normal limits.  IMPRESSION: 2.2 cm calcified lesion in the right renal sinus, better visualized on CT, where it measured 2.7 cm. Lesion is unchanged in size since 2008 and therefore likely benign. If definitive characterization is desired, consider follow-up MRI with contrast when renal function improves.  Additional simple bilateral renal cysts, as above.  No hydronephrosis.   Electronically Signed   By: Julian Hy M.D.   On: 03/24/2015 17:14     Medications:   . sodium chloride     . acidophilus  1 capsule Oral Daily  . amLODipine  5 mg Oral Daily  . antiseptic oral rinse  7 mL Mouth Rinse BID  . cycloSPORINE  1 drop Both Eyes BID  . donepezil  5 mg Oral QHS  . heparin  5,000 Units Subcutaneous 3 times per day  . metoprolol tartrate  25 mg Oral BID  . piperacillin-tazobactam (ZOSYN)  IV  2.25 g Intravenous Q8H  . sertraline  50 mg Oral Daily  . sodium chloride  3 mL Intravenous Q12H   acetaminophen **OR** acetaminophen, alum & mag hydroxide-simeth, loperamide, LORazepam, ondansetron **OR** ondansetron (ZOFRAN) IV, oxyCODONE  Assessment/ Plan:   Acute renal  failure Most likely ATN Urinalysis benign and renal ultrasound negative  Hyponatremia U Na 11 Most likely secondary volume depletion  Metabolic acidosis corrected   Diverticulitis - Antibiotics  HTN/volume continue to replete volume  Will continue to follow    LOS: 2 Toni [name] W @TODAY @8 :25 AM

## 2015-03-26 NOTE — Progress Notes (Signed)
CARE MANAGEMENT NOTE 03/26/2015  Patient:  Toni [name], Toni [name]   Account Number:  [id number]  Date Initiated:  03/25/2015  Documentation initiated by:  DAVIS,RHONDA  Subjective/Objective Assessment:   aki due to dehydration     Action/Plan:   home when stable   Anticipated DC Date:  03/29/2015   Anticipated DC Plan:  HOME/SELF CARE  In-house referral  NA      DC Planning Services  CM consult      Choice offered to / List presented to:             Status of service:  In process, will continue to follow Medicare Important Message given?   (If response is "NO", the following Medicare IM given date fields will be blank) Date Medicare IM given:   Medicare IM given by:   Date Additional Medicare IM given:   Additional Medicare IM given by:    Discharge Disposition:    Per UR Regulation:  Reviewed for med. necessity/level of care/duration of stay  If discussed at [place] of Stay Meetings, dates discussed:    Comments:  March 26, 2015/Rhonda L. Rosana Hoes, RN, BSN, CCM. Case Management [place] [phone] No discharge needs present of time of review. patient with acute renal failure with SIADH.  March 25, 2015/Rhonda L. Rosana Hoes, RN, BSN, CCM. Case Management [place] [phone] No discharge needs present of time of review.

## 2015-03-26 NOTE — Evaluation (Signed)
Physical Therapy Evaluation Patient Details Name: Toni [name] MRN: [id number] DOB: 1934/06/26 Today's Date: 03/26/2015   History of Present Illness  81 y/o WF admitted with diverticulitis after having persistant diarrhea.  Clinical Impression  Pt admitted with above diagnosis. Pt currently with functional limitations due to the deficits listed below (see PT Problem List). Pt will benefit from skilled PT to increase their independence and safety with mobility to allow discharge to the venue listed below.  Pt moving well while ambulating pushing IV pole.  She feels a little "wobbly", but has not eaten much and hasn't been moving much.  Feel pt will progress well enough and not need any follow up PT after d/c from acute care.     Follow Up Recommendations No PT follow up    Equipment Recommendations  None recommended by PT    Recommendations for Other Services       Precautions / Restrictions Precautions Precautions: None      Mobility  Bed Mobility Overal bed mobility: Modified Independent                Transfers Overall transfer level: Needs assistance Equipment used: None Transfers: Sit to/from Stand Sit to Stand: Supervision         General transfer comment: cues for safety  Ambulation/Gait Ambulation/Gait assistance: Min guard Ambulation Distance (Feet): 275 Feet Assistive device:  (pushing IV pole) Gait Pattern/deviations: Step-through pattern     General Gait Details: Pt ambulated without AD, but felt a little "wobbly" so ambulated most of gait trial pushing IV pole to steady herself.  No LOB or overt balance issues.  Stairs            Wheelchair Mobility    Modified Rankin (Stroke Patients Only)       Balance Overall balance assessment: No apparent balance deficits (not formally assessed)                                           Pertinent Vitals/Pain Pain Assessment: 0-10 Pain Score: 1  Pain Descriptors /  Indicators: Aching Pain Intervention(s): Limited activity within patient's tolerance;Monitored during session    Home Living Family/patient expects to be discharged to:: Private residence Living Arrangements: Spouse/significant other Available Help at Discharge: Family;Available 24 hours/day Type of Home: House Home Access: Stairs to enter Entrance Stairs-Rails: Right Entrance Stairs-Number of Steps: 2-3 Home Layout: One level Home Equipment: None      Prior Function Level of Independence: Independent               Hand Dominance        Extremity/Trunk Assessment   Upper Extremity Assessment: Overall WFL for tasks assessed           Lower Extremity Assessment: Overall WFL for tasks assessed;Generalized weakness         Communication   Communication: No difficulties  Cognition Arousal/Alertness: Awake/alert Behavior During Therapy: WFL for tasks assessed/performed Overall Cognitive Status: Within Functional Limits for tasks assessed                      General Comments      Exercises        Assessment/Plan    PT Assessment Patient needs continued PT services  PT Diagnosis Difficulty walking;Generalized weakness   PT Problem List Decreased strength;Decreased activity tolerance;Decreased balance;Decreased mobility  PT Treatment Interventions  Gait training;Functional mobility training;Therapeutic activities;Therapeutic exercise;Balance training   PT Goals (Current goals can be found in the Care Plan section) Acute Rehab PT Goals Patient Stated Goal: To go home PT Goal Formulation: With patient Time For Goal Achievement: 04/02/15 Potential to Achieve Goals: Good    Frequency Min 3X/week   Barriers to discharge        Co-evaluation               End of Session Equipment Utilized During Treatment: Gait belt Activity Tolerance: Patient tolerated treatment well Patient left: in chair;with nursing/sitter in room;with family/visitor  present Nurse Communication: Mobility status         Time: 1520-1540 PT Time Calculation (min) (ACUTE ONLY): 20 min   Charges:   PT Evaluation $Initial PT Evaluation Tier I: 1 Procedure     PT G Codes:        Toni [name] 03/26/2015, 4:54 PM

## 2015-03-26 NOTE — Progress Notes (Signed)
Patient ID: Toni [name], female   DOB: [birth year], 81 y.o.   MRN: [id number]  [facility] PROGRESS NOTE  Toni [name] WCB:[id number] DOB: [birth year] DOA: 03/24/2015 PCP: Unice Cobble, MD   Brief narrative:    81 y.o. female history of diverticulosis, HTN, HLD, presented to emergency department with main concern of progressively worsening weakness and ongoing diarrhea. She was diagnosed with acute diverticulitis by Dr. Collene Mares about 10 days prior to this admission and was started on empiric ciprofloxacin and Flagyl. Patient explained she felt initially better with less abdominal pain but diarrhea persisted and she started to develop more nausea resulting in poor oral intake.  In emergency department, patient noted to be hemodynamically stable, blood work notable for acute renal failure with creatinine 7 and BUN 40. Tried hospitalist asked to admit for further evaluation. Nephrologist and gastroenterologist has been consulted for assistance.   Assessment/Plan:    Principal Problem:   Acute on chronic diverticulitis - Patient appears stable this morning, reports feeling better but still with diarrhea  - Appreciate gastroenterology team following - continue IV ABX Zosyn day #3, follow up on stool studies  - provide imodium as needed  Active Problems:   Acute renal failure - Likely progression of prerenal etiology to ATN - Urinalysis unremarkable, renal ultrasound unremarkable - Cr still trending up and as expected with ATN - Plan to repeat electrolyte panel in the morning - Appreciate nephrologist recommendations and assistance   Elevated lipase - possibly related to acute problem, diverticulitis - no signs of acute pancreatitis - will repeat lipase in AM   Hypokalemia - will add KCl to IVF and will repeat BMP  in AM   Hyponatremia - Likely from prerenal etiology in the setting of hypovolemia - Sodium level improving with IVF, repeat BMP in AM    Metabolic acidosis - responded well to IV bicarb, improving - will repeat BMP in AM   Acute drop in Hg  - possibly from IVF pt has been receiving - no sings of active bleeding - will ask for anemia panel with next blood work    Essential hypertension - Add hydralazine as needed for better blood pressure control - continue Norvasc and Metoprolol     Non severe protein calorie malnutrition - pt tolerating diet well   DVT prophylaxis - Heparin SQ   Code Status: Full.  Family Communication:  plan of care discussed with the patient, daughter and husband at bedside  Disposition Plan: Not ready for discharge  IV access:  Peripheral IV  Procedures and diagnostic studies:    Ct Abdomen Pelvis Wo Contrast  03/24/2015  Extensive sigmoid diverticulosis with findings compatible with superimposed diverticulitis. No abscess or perforation.  Multiple hepatic and bilateral renal cyst. Some of these have increased in size but no aggressive features are identified.  Aortoiliac atherosclerosis without aneurysm.     US Renal  03/24/2015  2.2 cm calcified lesion in the right renal sinus, better visualized on CT, where it measured 2.7 cm. Lesion is unchanged in size since 2008 and therefore likely benign. If definitive characterization is desired, consider follow-up MRI with contrast when renal function improves.  Additional simple bilateral renal cysts, as above.  No hydronephrosis.     Medical Consultants:  GI [facility]   Other Consultants:  None   IAnti-Infectives:   Zosyn 4/14 -->  [name], MD  [facility] Pager [phone]  If 7PM-7AM, please contact night-coverage [email or web address] Password [email or web address] 03/26/2015, 7:20 PM   LOS: 2 days  HPI/Subjective: No events overnight. Still with diarrhea.   Objective: Filed Vitals:   03/25/15 1502 03/25/15 2019  03/26/15 0536 03/26/15 1300  BP: 128/55 152/60 155/65 124/54  Pulse: 69 84 88 59  Temp: 97.7 F (36.5 C) 97.7 F (36.5 C) 98.8 F (37.1 C) 98.7 F (37.1 C)  TempSrc: Oral Oral Oral Oral  Resp: 20 19 18 18   Height:      Weight:      SpO2: 98% 97% 96% 97%    Intake/Output Summary (Last 24 hours) at 03/26/15 1920 Last data filed at 03/26/15 1832  Gross per 24 hour  Intake 1553.33 ml  Output    800 ml  Net 753.33 ml    Exam:   General:  Pt is alert, follows commands appropriately, not in acute distress  Cardiovascular: Regular rate and rhythm, , no rubs, no gallops  Respiratory: Clear to auscultation bilaterally, no wheezing, no crackles, no rhonchi  Abdomen: Soft, non tender, non distended, bowel sounds present, no guarding  Extremities: pulses DP and PT palpable bilaterally  Neuro: Grossly nonfocal  Data Reviewed: Basic Metabolic Panel:  Recent Labs Lab 03/24/15 1030 03/24/15 1810 03/25/15 0329 03/26/15 0450  NA 124*  --  127*  126* 131*  K 3.5  --  3.7  3.7 3.3*  CL 91*  --  99  99 96  CO2 20  --  17*  17* 25  GLUCOSE 108*  --  114*  113* 93  BUN 40*  --  39*  39* 38*  CREATININE 7.10* 6.80* 6.90*  6.84* 7.33*  CALCIUM 8.0*  --  7.4*  7.7* 6.9*  PHOS  --   --  6.4* 6.2*   Liver Function Tests:  Recent Labs Lab 03/24/15 1030 03/25/15 0329 03/26/15 0450  AST 20 21  --   ALT 18 15  --   ALKPHOS 77 80  --   BILITOT 0.7 0.7  --   PROT 6.5 6.4  --   ALBUMIN 3.5 3.4*  3.4* 2.7*    Recent Labs Lab 03/24/15 1030  LIPASE 142*   CBC:  Recent Labs Lab 03/24/15 1030 03/24/15 1810 03/25/15 0329 03/26/15 0450  WBC 10.4 9.7 10.3 7.7  NEUTROABS 8.5*  --   --   --   HGB 11.9* 11.4* 12.0 10.6*  HCT 33.7* 32.7* 34.4* 30.0*  MCV 86.2 86.7 87.1 86.2  PLT 342 286 362 291   Cardiac Enzymes:  Recent Labs Lab 03/24/15 1810  CKTOTAL 82    Recent Results (from the past 240 hour(s))  Clostridium Difficile by PCR     Status: None    Collection Time: 03/24/15  5:48 PM  Result Value Ref Range Status   C difficile by pcr NEGATIVE NEGATIVE Final  MRSA PCR Screening     Status: Abnormal   Collection Time: 03/24/15  5:48 PM  Result Value Ref Range Status   MRSA by PCR INVALID RESULTS, SPECIMEN SENT FOR CULTURE (A) NEGATIVE Final     Scheduled Meds: . acidophilus  1 capsule Oral Daily  . amLODipine  5 mg Oral Daily  . antiseptic oral rinse  7 mL Mouth Rinse BID  . cycloSPORINE  1 drop Both Eyes BID  . donepezil  5 mg Oral QHS  . heparin  5,000 Units Subcutaneous 3 times per day  . metoprolol tartrate  25 mg Oral BID  . piperacillin-tazobactam (ZOSYN)  IV  2.25 g Intravenous Q8H  . sertraline  50 mg Oral Daily  .  sodium chloride  3 mL Intravenous Q12H   Continuous Infusions: . sodium chloride 75 mL/hr at 03/26/15 1540

## 2015-03-26 NOTE — Progress Notes (Signed)
Patient ID: Toni [name], female   DOB: March 02, 1934, 81 y.o.   MRN: [id number]    Progress Note Covering for Drs. Mann and Hung   Subjective  Feels about the same-still having diarrhea, not much pain- imodium helped a lot yesterday- no appetite.   Objective   Vital signs in last 24 hours: Temp:  [97.7 F (36.5 C)-98.8 F (37.1 C)] 98.8 F (37.1 C) (04/16 0536) Pulse Rate:  [59-88] 88 (04/16 0536) Resp:  [16-25] 18 (04/16 0536) BP: (128-172)/(55-68) 155/65 mmHg (04/16 0536) SpO2:  [95 %-98 %] 96 % (04/16 0536) Last BM Date: 03/25/15 General:  elderly  white female in NAD Heart:  Regular rate and rhythm; no murmurs Lungs: Respirations even and unlabored, lungs CTA bilaterally Abdomen:  Soft,minimally tender LLQ,and nondistended. Normal bowel sounds. Extremities:  Without edema. Neurologic:  Alert and oriented,  grossly normal neurologically. Psych:  Cooperative. Normal mood and affect.  Intake/Output from previous day: 04/15 0701 - 04/16 0700 In: 1350 [I.V.:1250; IV Piggyback:100] Out: 400 [Urine:400] Intake/Output this shift: Total I/O In: 100 [IV Piggyback:100] Out: -   Lab Results:  Recent Labs  03/24/15 1810 03/25/15 0329 03/26/15 0450  WBC 9.7 10.3 7.7  HGB 11.4* 12.0 10.6*  HCT 32.7* 34.4* 30.0*  PLT 286 362 291   BMET  Recent Labs  03/24/15 1030 03/24/15 1810 03/25/15 0329 03/26/15 0450  NA 124*  --  127*  126* 131*  K 3.5  --  3.7  3.7 3.3*  CL 91*  --  99  99 96  CO2 20  --  17*  17* 25  GLUCOSE 108*  --  114*  113* 93  BUN 40*  --  39*  39* 38*  CREATININE 7.10* 6.80* 6.90*  6.84* 7.33*  CALCIUM 8.0*  --  7.4*  7.7* 6.9*   LFT  Recent Labs  03/25/15 0329 03/26/15 0450  PROT 6.4  --   ALBUMIN 3.4*  3.4* 2.7*  AST 21  --   ALT 15  --   ALKPHOS 80  --   BILITOT 0.7  --    PT/INR No results for input(s): LABPROT, INR in the last 72 hours.  Studies/Results: Ct Abdomen Pelvis Wo Contrast  03/24/2015   CLINICAL DATA:   Lower abdominal pain, diarrhea and vomiting for 4 days.  EXAM: CT ABDOMEN AND PELVIS WITHOUT CONTRAST  TECHNIQUE: Multidetector CT imaging of the abdomen and pelvis was performed following the standard protocol without IV contrast.  COMPARISON:  CT abdomen and pelvis 04/15/2007.  FINDINGS: Linear atelectasis or scar is seen in the lung base cyst. No pleural or pericardial effusion.  Multiple cystic lesions are identified in the liver. The largest is in the right hepatic lobe measuring 11.5 cm transverse by 9.5 cm AP by 9.7 cm craniocaudal, increased from 9.8 cm transverse by 7.0 cm AP by 7.5 cm craniocaudal. An exophytic cyst off the upper pole of the left kidney measures 3.3 x 3.0 cm in the axial plane compared to 2.2 x 1.5 cm on the prior examination. The cyst has Hounsfield unit measurements of 22.7. Parapelvic cysts are also seen bilaterally. In the lower pole of the right kidney, there is a lesion containing calcifications measuring 2.7 cm in diameter which is unchanged. The adrenal glands, spleen, pancreas and biliary tree all appear normal  A small fat containing umbilical hernia is identified. The patient has extensive sigmoid diverticulosis. There is mild stranding about the sigmoid colon most notable in its mid segment.  The walls of the sigmoid colon are thickened. No abscess or perforation is identified. The colon is otherwise unremarkable. The appendix is not visualized and may have been removed. Small hiatal hernia is noted. The stomach is otherwise unremarkable. The small bowel appears normal. Scattered aortoiliac atherosclerosis without aneurysm is identified. No lymphadenopathy or fluid is seen.  No lytic or sclerotic bony lesion is identified.  IMPRESSION: Extensive sigmoid diverticulosis with findings compatible with superimposed diverticulitis. No abscess or perforation.  Multiple hepatic and bilateral renal cyst. Some of these have increased in size but no aggressive features are identified.   Aortoiliac atherosclerosis without aneurysm.   Electronically Signed   By: Inge Rise M.D.   On: 03/24/2015 13:02   US Renal  03/24/2015   CLINICAL DATA:  Acute renal failure  EXAM: RENAL/URINARY TRACT ULTRASOUND COMPLETE  COMPARISON:  CT abdomen pelvis dated 03/24/2015.  FINDINGS: Right Kidney:  Length: 12.8 cm. 4.0 x 4.5 x 3.7 cm interpolar simple cyst. 2.2 x 2.2 x 2.2 cm calcified lesion in the renal sinus, poorly evaluated on ultrasound. This lesion measured 2.7 cm on the current CT, previously 2.7 cm in 2008 and 2.0 cm in 2002. No hydronephrosis.  Left Kidney:  Length: 12.8 cm. Two upper pole simple cysts, measuring 2.9 x 2.9 x 2.4 cm and 2.6 x 2.8 x 2.3 cm. No hydronephrosis.  Bladder:  Within normal limits.  IMPRESSION: 2.2 cm calcified lesion in the right renal sinus, better visualized on CT, where it measured 2.7 cm. Lesion is unchanged in size since 2008 and therefore likely benign. If definitive characterization is desired, consider follow-up MRI with contrast when renal function improves.  Additional simple bilateral renal cysts, as above.  No hydronephrosis.   Electronically Signed   By: Julian Hy M.D.   On: 03/24/2015 17:14       Assessment / Plan:   #1 81 yo female with acute diverticulitis complicated by diarrhea- C diff neg. Continue Zosyn Add mycostatin oral susp for probable thrush Continue imodium prn Await pending stool studies  #2 Acute renal failure-per renal  Principal Problem:   Diverticulitis Active Problems:   Diverticulosis of large intestine   Essential hypertension   AKI (acute kidney injury)   Dehydration   Hyponatremia    LOS: 2 days   Toni [name]  03/26/2015, 8:51 AM     Attending physician's note   I have taken an interval history, reviewed the chart and examined the patient. I agree with the Advanced Practitioner's note, impression and recommendations. Acute diverticulitis and diarrhea improved. Continue current mgmt. Acute renal  failure - Nephrology is managing.   Toni Riffle. [name] Plan, MD [facility]

## 2015-03-27 LAB — CBC
HCT: 31.1 % — ABNORMAL LOW (ref 36.0–46.0)
Hemoglobin: 10.8 g/dL — ABNORMAL LOW (ref 12.0–15.0)
MCH: 30.9 pg (ref 26.0–34.0)
MCHC: 34.7 g/dL (ref 30.0–36.0)
MCV: 89.1 fL (ref 78.0–100.0)
PLATELETS: 292 10*3/uL (ref 150–400)
RBC: 3.49 MIL/uL — ABNORMAL LOW (ref 3.87–5.11)
RDW: 13 % (ref 11.5–15.5)
WBC: 7.3 10*3/uL (ref 4.0–10.5)

## 2015-03-27 LAB — RETICULOCYTES
RBC.: 3.49 MIL/uL — AB (ref 3.87–5.11)
RETIC CT PCT: 1.2 % (ref 0.4–3.1)
Retic Count, Absolute: 41.9 10*3/uL (ref 19.0–186.0)

## 2015-03-27 LAB — IRON AND TIBC
IRON: 44 ug/dL (ref 42–145)
Saturation Ratios: 23 % (ref 20–55)
TIBC: 191 ug/dL — AB (ref 250–470)
UIBC: 147 ug/dL (ref 125–400)

## 2015-03-27 LAB — BASIC METABOLIC PANEL
Anion gap: 10 (ref 5–15)
BUN: 37 mg/dL — ABNORMAL HIGH (ref 6–23)
CHLORIDE: 99 mmol/L (ref 96–112)
CO2: 25 mmol/L (ref 19–32)
CREATININE: 7.11 mg/dL — AB (ref 0.50–1.10)
Calcium: 7.1 mg/dL — ABNORMAL LOW (ref 8.4–10.5)
GFR calc Af Amer: 6 mL/min — ABNORMAL LOW (ref >90)
GFR, EST NON AFRICAN AMERICAN: 5 mL/min — AB (ref >90)
GLUCOSE: 107 mg/dL — AB (ref 70–99)
POTASSIUM: 3.6 mmol/L (ref 3.5–5.1)
SODIUM: 134 mmol/L — AB (ref 135–145)

## 2015-03-27 LAB — FOLATE: Folate: 5.8 ng/mL

## 2015-03-27 LAB — FERRITIN: Ferritin: 345 ng/mL — ABNORMAL HIGH (ref 10–291)

## 2015-03-27 LAB — MRSA CULTURE: Culture: NO GROWTH

## 2015-03-27 LAB — LIPASE, BLOOD: LIPASE: 75 U/L — AB (ref 11–59)

## 2015-03-27 LAB — VITAMIN B12: Vitamin B-12: 236 pg/mL (ref 211–911)

## 2015-03-27 MED ORDER — SACCHAROMYCES BOULARDII 250 MG PO CAPS
250.0000 mg | ORAL_CAPSULE | Freq: Two times a day (BID) | ORAL | Status: DC
  Administered 2015-03-27 – 2015-04-01 (×11): 250 mg via ORAL
  Filled 2015-03-27 (×11): qty 1

## 2015-03-27 NOTE — Progress Notes (Signed)
Patient ID: Toni [name], female   DOB: 03-03-1934, 81 y.o.   MRN: [id number]  [facility] PROGRESS NOTE  KSENIA KUNZ KXF:[id number] DOB: [birth year] DOA: 03/24/2015 PCP: Unice Cobble, MD   Brief narrative:    81 y.o. female history of diverticulosis, HTN, HLD, presented to emergency department with main concern of progressively worsening weakness and ongoing diarrhea. She was diagnosed with acute diverticulitis by Dr. Collene Mares about 10 days prior to this admission and was started on empiric ciprofloxacin and Flagyl. Patient explained she felt initially better with less abdominal pain but diarrhea persisted and she started to develop more nausea resulting in poor oral intake.  In emergency department, patient noted to be hemodynamically stable, blood work notable for acute renal failure with creatinine 7 and BUN 40. Tried hospitalist asked to admit for further evaluation. Nephrologist and gastroenterologist has been consulted for assistance.   Assessment/Plan:    Principal Problem:   Acute on chronic diverticulitis - Patient appears stable this morning, reports feeling better but still with diarrhea  - Appreciate gastroenterology team following - today is day #4 of Zosyn, d/w ID on call, may try to d/c ABX today and monitor pt clinically since pt appears non toxic with no fever and WBC is WNL, pt has recently completed flagyl and says she has not tolerated Flagyl well and would not take it again  - added probiotic to see if this will help with diarrhea  - provide imodium as needed  Active Problems:   Acute renal failure - Likely progression of prerenal etiology to ATN - Urinalysis unremarkable, renal ultrasound unremarkable - Cr starting to trend down slowly - Plan to repeat electrolyte panel in the morning -  Appreciate nephrologist recommendations and assistance   Elevated lipase - possibly related to acute problem, diverticulitis - no signs of acute pancreatitis - lipase is trending down, will repeat lipase level in AM   Hypokalemia - K is still on low end of normal, so will continue to supplement as pt still with diarrhea    Hyponatremia - Likely from prerenal etiology in the setting of hypovolemia - Sodium level improving with IVF, repeat BMP in AM    Metabolic acidosis - responded well to IV bicarb, improving - will repeat BMP in AM   Acute drop in Hg  - possibly from IVF pt has been receiving - no sings of active bleeding, Hg stable over the past 24 hours - anemia panel pending - will repeat CBC in AM   Essential hypertension - Added hydralazine as needed for better blood pressure control - continue Norvasc and Metoprolol     Non severe protein calorie malnutrition - pt tolerating diet well   DVT prophylaxis - Heparin SQ   Code Status: Full.  Family Communication:  plan of care discussed with the patient, daughter and husband at bedside  Disposition Plan: Not ready for discharge  IV access:  Peripheral IV  Procedures and diagnostic studies:    Ct Abdomen Pelvis Wo Contrast  03/24/2015  Extensive sigmoid diverticulosis with findings compatible with superimposed diverticulitis. No abscess or perforation.  Multiple hepatic and bilateral renal cyst. Some of these have increased in size but no aggressive features are identified.  Aortoiliac atherosclerosis without aneurysm.     US Renal  03/24/2015  2.2 cm calcified lesion in the right renal sinus, better visualized on CT, where it measured 2.7 cm. Lesion is unchanged in size since 2008 and therefore likely benign. If definitive characterization is desired,  consider follow-up MRI with contrast when renal function improves.  Additional simple bilateral renal cysts, as above.  No hydronephrosis.     Medical Consultants:  GI Nephrology    Other Consultants:  None   IAnti-Infectives:   Zosyn 4/14 --> 4/17  [name], MD  [facility] Pager [phone]  If 7PM-7AM, please contact night-coverage [email or web address] Password [email or web address] 03/27/2015, 1:20 PM   LOS: 3 days   HPI/Subjective: No events overnight. Still with persistent watery diarrhea.   Objective: Filed Vitals:   03/26/15 0536 03/26/15 1300 03/26/15 1931 03/27/15 0528  BP: 155/65 124/54 150/49 132/50  Pulse: 88 59 68 65  Temp: 98.8 F (37.1 C) 98.7 F (37.1 C) 98.3 F (36.8 C) 98.5 F (36.9 C)  TempSrc: Oral Oral Oral Oral  Resp: 18 18 20 16   Height:      Weight:      SpO2: 96% 97% 97% 92%    Intake/Output Summary (Last 24 hours) at 03/27/15 1320 Last data filed at 03/27/15 0500  Gross per 24 hour  Intake 2468.75 ml  Output    800 ml  Net 1668.75 ml    Exam:   General:  Pt is alert, follows commands appropriately, not in acute distress  Cardiovascular: Regular rate and rhythm, , no rubs, no gallops  Respiratory: Clear to auscultation bilaterally, no wheezing, no crackles, no rhonchi  Abdomen: Soft, non tender, non distended, bowel sounds present, no guarding  Extremities: pulses DP and PT palpable bilaterally  Neuro: Grossly nonfocal  Data Reviewed: Basic Metabolic Panel:  Recent Labs Lab 03/24/15 1030 03/24/15 1810 03/25/15 0329 03/26/15 0450 03/27/15 0515  NA 124*  --  127*  126* 131* 134*  K 3.5  --  3.7  3.7 3.3* 3.6  CL 91*  --  99  99 96 99  CO2 20  --  17*  17* 25 25  GLUCOSE 108*  --  114*  113* 93 107*  BUN 40*  --  39*  39* 38* 37*  CREATININE 7.10* 6.80* 6.90*  6.84* 7.33* 7.11*  CALCIUM 8.0*  --  7.4*  7.7* 6.9* 7.1*  PHOS  --   --  6.4* 6.2*  --    Liver Function Tests:  Recent Labs Lab 03/24/15 1030 03/25/15 0329 03/26/15 0450  AST 20 21  --   ALT 18 15  --   ALKPHOS 77 80  --   BILITOT 0.7 0.7  --   PROT 6.5 6.4  --   ALBUMIN 3.5 3.4*  3.4* 2.7*    Recent Labs Lab 03/24/15 1030  03/27/15 0515  LIPASE 142* 75*   CBC:  Recent Labs Lab 03/24/15 1030 03/24/15 1810 03/25/15 0329 03/26/15 0450 03/27/15 0515  WBC 10.4 9.7 10.3 7.7 7.3  NEUTROABS 8.5*  --   --   --   --   HGB 11.9* 11.4* 12.0 10.6* 10.8*  HCT 33.7* 32.7* 34.4* 30.0* 31.1*  MCV 86.2 86.7 87.1 86.2 89.1  PLT 342 286 362 291 292   Cardiac Enzymes:  Recent Labs Lab 03/24/15 1810  CKTOTAL 82    Recent Results (from the past 240 hour(s))  Clostridium Difficile by PCR     Status: None   Collection Time: 03/24/15  5:48 PM  Result Value Ref Range Status   C difficile by pcr NEGATIVE NEGATIVE Final  MRSA PCR Screening     Status: Abnormal   Collection Time: 03/24/15  5:48 PM  Result Value Ref Range Status   MRSA  by PCR INVALID RESULTS, SPECIMEN SENT FOR CULTURE (A) NEGATIVE Final     Scheduled Meds: . acidophilus  1 capsule Oral Daily  . amLODipine  5 mg Oral Daily  . antiseptic oral rinse  7 mL Mouth Rinse BID  . cycloSPORINE  1 drop Both Eyes BID  . donepezil  5 mg Oral QHS  . heparin  5,000 Units Subcutaneous 3 times per day  . metoprolol tartrate  25 mg Oral BID  . piperacillin-tazobactam (ZOSYN)  IV  2.25 g Intravenous Q8H  . sertraline  50 mg Oral Daily  . sodium chloride  3 mL Intravenous Q12H   Continuous Infusions: . 0.9 % NaCl with KCl 40 mEq / L 75 mL/hr (03/26/15 2120)

## 2015-03-27 NOTE — Progress Notes (Signed)
[facility] ROUNDING NOTE   Subjective:   Interval History: no issues and stable  Diarrhea yesterday  Objective:  Vital signs in last 24 hours:  Temp:  [98.3 F (36.8 C)-98.7 F (37.1 C)] 98.5 F (36.9 C) (04/17 0528) Pulse Rate:  [59-68] 65 (04/17 0528) Resp:  [16-20] 16 (04/17 0528) BP: (124-150)/(49-54) 132/50 mmHg (04/17 0528) SpO2:  [92 %-97 %] 92 % (04/17 0528)  Weight change:  Filed Weights   03/24/15 0842 03/25/15 0400  Weight: 79.379 kg (175 lb) 85.4 kg (188 lb 4.4 oz)    Intake/Output: I/O last 3 completed shifts: In: 3138.3 [P.O.:1050; I.V.:1888.3; IV Piggyback:200] Out: 800 [Urine:800]   Intake/Output this shift:     CVS- RRR RS- CTA ABD- BS present soft non-distended EXT- no edema   Basic Metabolic Panel:  Recent Labs Lab 03/24/15 1030 03/24/15 1810 03/25/15 0329 03/26/15 0450 03/27/15 0515  NA 124*  --  127*  126* 131* 134*  K 3.5  --  3.7  3.7 3.3* 3.6  CL 91*  --  99  99 96 99  CO2 20  --  17*  17* 25 25  GLUCOSE 108*  --  114*  113* 93 107*  BUN 40*  --  39*  39* 38* 37*  CREATININE 7.10* 6.80* 6.90*  6.84* 7.33* 7.11*  CALCIUM 8.0*  --  7.4*  7.7* 6.9* 7.1*  PHOS  --   --  6.4* 6.2*  --     Liver Function Tests:  Recent Labs Lab 03/24/15 1030 03/25/15 0329 03/26/15 0450  AST 20 21  --   ALT 18 15  --   ALKPHOS 77 80  --   BILITOT 0.7 0.7  --   PROT 6.5 6.4  --   ALBUMIN 3.5 3.4*  3.4* 2.7*    Recent Labs Lab 03/24/15 1030 03/27/15 0515  LIPASE 142* 75*   No results for input(s): AMMONIA in the last 168 hours.  CBC:  Recent Labs Lab 03/24/15 1030 03/24/15 1810 03/25/15 0329 03/26/15 0450 03/27/15 0515  WBC 10.4 9.7 10.3 7.7 7.3  NEUTROABS 8.5*  --   --   --   --   HGB 11.9* 11.4* 12.0 10.6* 10.8*  HCT 33.7* 32.7* 34.4* 30.0* 31.1*  MCV 86.2 86.7 87.1 86.2 89.1  PLT 342 286 362 291 292    Cardiac Enzymes:  Recent Labs Lab 03/24/15 1810  CKTOTAL 82    BNP: Invalid  input(s): POCBNP  CBG: No results for input(s): GLUCAP in the last 168 hours.  Microbiology: Results for orders placed or performed during the hospital encounter of 03/24/15  Clostridium Difficile by PCR     Status: None   Collection Time: 03/24/15  5:48 PM  Result Value Ref Range Status   C difficile by pcr NEGATIVE NEGATIVE Final  MRSA PCR Screening     Status: Abnormal   Collection Time: 03/24/15  5:48 PM  Result Value Ref Range Status   MRSA by PCR INVALID RESULTS, SPECIMEN SENT FOR CULTURE (A) NEGATIVE Final    Comment:        The GeneXpert MRSA Assay (FDA approved for NASAL specimens only), is one component of a comprehensive MRSA colonization surveillance program. It is not intended to diagnose MRSA infection nor to guide or monitor treatment for MRSA infections. MAY,B/2W @2110  ON 03/24/15 BY KARCZEWSKI,S.   MRSA culture     Status: None (Preliminary result)   Collection Time: 03/24/15  5:48 PM  Result Value  Ref Range Status   Specimen Description NOSE  Final   Special Requests NONE  Final   Culture   Final    NO GROWTH 1 DAY Performed at [facility]    Report Status PENDING  Incomplete  Culture, blood (routine x 2)     Status: None (Preliminary result)   Collection Time: 03/24/15  6:00 PM  Result Value Ref Range Status   Specimen Description BLOOD R HAND  Final   Special Requests Normal  Final   Culture   Final           BLOOD CULTURE RECEIVED NO GROWTH TO DATE CULTURE WILL BE HELD FOR 5 DAYS BEFORE ISSUING A FINAL NEGATIVE REPORT Performed at [facility]    Report Status PENDING  Incomplete  Culture, blood (routine x 2)     Status: None (Preliminary result)   Collection Time: 03/24/15  6:10 PM  Result Value Ref Range Status   Specimen Description BLOOD R ARM  Final   Special Requests Normal  Final   Culture   Final           BLOOD CULTURE RECEIVED NO GROWTH TO DATE CULTURE WILL BE HELD FOR 5 DAYS BEFORE ISSUING A FINAL NEGATIVE  REPORT Performed at [facility]    Report Status PENDING  Incomplete    Coagulation Studies: No results for input(s): LABPROT, INR in the last 72 hours.  Urinalysis: No results for input(s): COLORURINE, LABSPEC, PHURINE, GLUCOSEU, HGBUR, BILIRUBINUR, KETONESUR, PROTEINUR, UROBILINOGEN, NITRITE, LEUKOCYTESUR in the last 72 hours.  Invalid input(s): APPERANCEUR    Imaging: No results found.   Medications:   . 0.9 % NaCl with KCl 40 mEq / L 75 mL/hr (03/26/15 2120)   . acidophilus  1 capsule Oral Daily  . amLODipine  5 mg Oral Daily  . antiseptic oral rinse  7 mL Mouth Rinse BID  . cycloSPORINE  1 drop Both Eyes BID  . donepezil  5 mg Oral QHS  . heparin  5,000 Units Subcutaneous 3 times per day  . metoprolol tartrate  25 mg Oral BID  . nystatin  5 mL Oral QID  . piperacillin-tazobactam (ZOSYN)  IV  2.25 g Intravenous Q8H  . saccharomyces boulardii  250 mg Oral BID  . sertraline  50 mg Oral Daily  . sodium chloride  3 mL Intravenous Q12H   acetaminophen **OR** acetaminophen, alum & mag hydroxide-simeth, loperamide, LORazepam, ondansetron **OR** ondansetron (ZOFRAN) IV, oxyCODONE  Assessment/ Plan:   Acute renal failure Most likely ATN Urinalysis benign and renal ultrasound negative  Hyponatremia U Na 11 Most likely secondary volume depletion  Metabolic acidosis corrected   Diverticulitis - Antibiotics may stop today if OK with ID   HTN/volume continue to replete volume  Will continue to follow     LOS: 3 Gray Maugeri W @TODAY @11 :42 AM

## 2015-03-27 NOTE — Progress Notes (Signed)
Patient ID: Toni [name], female   DOB: March 29, 1934, 81 y.o.   MRN: [id number]    Progress Note Covering for Drs. Adriana Mccallum   Subjective  Feels OK- not great-weak, no appetite. No c/o abdominal pain and diarrhea has resolved   Objective   Vital signs in last 24 hours: Temp:  [98.3 F (36.8 C)-98.7 F (37.1 C)] 98.5 F (36.9 C) (04/17 0528) Pulse Rate:  [59-68] 65 (04/17 0528) Resp:  [16-20] 16 (04/17 0528) BP: (124-150)/(49-54) 132/50 mmHg (04/17 0528) SpO2:  [92 %-97 %] 92 % (04/17 0528) Last BM Date: 03/26/15 General: elderly   white female in NAD Heart:  Regular rate and rhythm; no murmurs Lungs: Respirations even and unlabored, lungs CTA bilaterally Abdomen:  Soft, minimally tender LLQ, and nondistended. Normal bowel sounds. Extremities:  Without edema. Neurologic:  Alert and oriented,  grossly normal neurologically. Psych:  Cooperative. Normal mood and affect.  Intake/Output from previous day: 04/16 0701 - 04/17 0700 In: 2748.8 [P.O.:1050; I.V.:1498.8; IV Piggyback:200] Out: 800 [Urine:800] Intake/Output this shift:    Lab Results:  Recent Labs  03/25/15 0329 03/26/15 0450 03/27/15 0515  WBC 10.3 7.7 7.3  HGB 12.0 10.6* 10.8*  HCT 34.4* 30.0* 31.1*  PLT 362 291 292   BMET  Recent Labs  03/25/15 0329 03/26/15 0450 03/27/15 0515  NA 127*  126* 131* 134*  K 3.7  3.7 3.3* 3.6  CL 99  99 96 99  CO2 17*  17* 25 25  GLUCOSE 114*  113* 93 107*  BUN 39*  39* 38* 37*  CREATININE 6.90*  6.84* 7.33* 7.11*  CALCIUM 7.4*  7.7* 6.9* 7.1*   LFT  Recent Labs  03/25/15 0329 03/26/15 0450  PROT 6.4  --   ALBUMIN 3.4*  3.4* 2.7*  AST 21  --   ALT 15  --   ALKPHOS 80  --   BILITOT 0.7  --      Assessment / Plan:   #1 81 yo female with acute diverticulitis- improving. Continue solid diet On Zosyn day #3 #2 diarrhea - ?antibiotic induced- c diff negative- continue probiotic and imodium prn- can d/c enteric precautions #3 AKI- creat still  7-renal is managing-felt to be ATN  Dr Collene Mares will resume care in am.   Principal Problem:   Diverticulitis Active Problems:   Diverticulosis of large intestine   Essential hypertension   AKI (acute kidney injury)   Dehydration   Hyponatremia    LOS: 3 days   Amy Esterwood  03/27/2015, 8:32 AM     Attending physician's note   I have taken an interval history, reviewed the chart and examined the patient. I agree with the Advanced Practitioner's note, impression and recommendations. Diverticulitis and diarrhea improving. Continue IV antibiotics. Dr. Collene Mares will resume care on Monday.   Pricilla Riffle. Fuller Plan, MD [facility]

## 2015-03-28 ENCOUNTER — Inpatient Hospital Stay (HOSPITAL_COMMUNITY): Payer: Medicare Other

## 2015-03-28 LAB — PROTEIN ELECTROPHORESIS, SERUM
A/G Ratio: 1.1 (ref 0.7–2.0)
Albumin ELP: 2.7 g/dL — ABNORMAL LOW (ref 3.2–5.6)
Alpha-1-Globulin: 0.3 g/dL (ref 0.1–0.4)
Alpha-2-Globulin: 0.8 g/dL (ref 0.4–1.2)
BETA GLOBULIN: 0.7 g/dL (ref 0.6–1.3)
GAMMA GLOBULIN: 0.6 g/dL (ref 0.5–1.6)
Globulin, Total: 2.4 g/dL (ref 2.0–4.5)
TOTAL PROTEIN ELP: 5.1 g/dL — AB (ref 6.0–8.5)

## 2015-03-28 LAB — BASIC METABOLIC PANEL
Anion gap: 10 (ref 5–15)
BUN: 34 mg/dL — ABNORMAL HIGH (ref 6–23)
CALCIUM: 7.2 mg/dL — AB (ref 8.4–10.5)
CHLORIDE: 100 mmol/L (ref 96–112)
CO2: 21 mmol/L (ref 19–32)
CREATININE: 6.66 mg/dL — AB (ref 0.50–1.10)
GFR calc Af Amer: 6 mL/min — ABNORMAL LOW (ref >90)
GFR calc non Af Amer: 5 mL/min — ABNORMAL LOW (ref >90)
Glucose, Bld: 130 mg/dL — ABNORMAL HIGH (ref 70–99)
POTASSIUM: 3.9 mmol/L (ref 3.5–5.1)
Sodium: 131 mmol/L — ABNORMAL LOW (ref 135–145)

## 2015-03-28 LAB — LIPASE, BLOOD: Lipase: 38 U/L (ref 11–59)

## 2015-03-28 LAB — CBC
HEMATOCRIT: 31.7 % — AB (ref 36.0–46.0)
Hemoglobin: 10.7 g/dL — ABNORMAL LOW (ref 12.0–15.0)
MCH: 30.5 pg (ref 26.0–34.0)
MCHC: 33.8 g/dL (ref 30.0–36.0)
MCV: 90.3 fL (ref 78.0–100.0)
Platelets: 318 10*3/uL (ref 150–400)
RBC: 3.51 MIL/uL — ABNORMAL LOW (ref 3.87–5.11)
RDW: 13.2 % (ref 11.5–15.5)
WBC: 10.5 10*3/uL (ref 4.0–10.5)

## 2015-03-28 MED ORDER — FUROSEMIDE 10 MG/ML IJ SOLN
20.0000 mg | Freq: Every day | INTRAMUSCULAR | Status: DC
Start: 2015-03-28 — End: 2015-03-29
  Administered 2015-03-28: 20 mg via INTRAVENOUS
  Filled 2015-03-28: qty 2

## 2015-03-28 MED ORDER — FUROSEMIDE 10 MG/ML IJ SOLN
40.0000 mg | Freq: Once | INTRAMUSCULAR | Status: AC
  Administered 2015-03-28: 40 mg via INTRAVENOUS
  Filled 2015-03-28: qty 4

## 2015-03-28 MED ORDER — LEVALBUTEROL HCL 0.63 MG/3ML IN NEBU
0.6300 mg | INHALATION_SOLUTION | Freq: Four times a day (QID) | RESPIRATORY_TRACT | Status: DC | PRN
  Administered 2015-03-28: 0.63 mg via RESPIRATORY_TRACT
  Filled 2015-03-28: qty 3

## 2015-03-28 NOTE — Progress Notes (Signed)
Subjective: Patient seems to be doing well from a GI standpoint. Denies having any abdominal pain. Her renal function seems to be improving slowly.    Objective: Vital signs in last 24 hours: Temp:  [98.1 F (36.7 C)-98.2 F (36.8 C)] 98.2 F (36.8 C) (04/18 0612) Pulse Rate:  [68-92] 68 (04/18 0944) Resp:  [18-20] 20 (04/18 0612) BP: (136-179)/(54-72) 136/59 mmHg (04/18 0944) SpO2:  [93 %-96 %] 93 % (04/18 0612) Last BM Date: 03/28/15 (Multiple bowel movement)  Intake/Output from previous day: 04/17 0701 - 04/18 0700 In: 2573.8 [P.O.:1080; I.V.:1493.8] Out: 960 [Urine:900; Emesis/NG output:60] Intake/Output this shift:    General appearance: alert, cooperative, appears stated age, no distress and moderately obese Resp: clear to auscultation bilaterally Cardio: regular rate and rhythm, S1, S2 normal, no murmur, click, rub or gallop GI: soft, obese, non-tender with hypoactive bowel sounds; no masses,  no organomegaly  Lab Results:  Recent Labs  03/26/15 0450 03/27/15 0515 03/28/15 0700  WBC 7.7 7.3 10.5  HGB 10.6* 10.8* 10.7*  HCT 30.0* 31.1* 31.7*  PLT 291 292 318   BMET  Recent Labs  03/26/15 0450 03/27/15 0515 03/28/15 0700  NA 131* 134* 131*  K 3.3* 3.6 3.9  CL 96 99 100  CO2 25 25 21   GLUCOSE 93 107* 130*  BUN 38* 37* 34*  CREATININE 7.33* 7.11* 6.66*  CALCIUM 6.9* 7.1* 7.2*   LFT  Recent Labs  03/26/15 0450  ALBUMIN 2.7*   Medications: I have reviewed the patient's current medications.  Assessment/Plan: 1) Acute on chronic diverticulosis-significantly improved.  2) ARF-ATN secondary to dehydration. 3) Depression on Seratraline. 4) HTN on Lopressor.  LOS: 4 days   Derris Millan 03/28/2015, 1:56 PM

## 2015-03-28 NOTE — Progress Notes (Signed)
Pt SOB and states she can not breath and chest pain. SpO2 at 88% on 3L oxygen. Pt oxygen was increase to 5L.BP elevated.Saturation increased  93%. Pt tachypneic ,crackles, audible wheezes assessed (more from throat). Notified on call provider.

## 2015-03-28 NOTE — Progress Notes (Signed)
Patient ID: Toni [name], female   DOB: 1934/02/28, 81 y.o.   MRN: [id number]  [facility] PROGRESS NOTE  Toni [name] HBZ:[id number] DOB: 1934-05-27 DOA: 03/24/2015 PCP: Unice Cobble, MD   Brief narrative:    81 y.o. female history of diverticulosis, HTN, HLD, presented to emergency department with main concern of progressively worsening weakness and ongoing diarrhea. She was diagnosed with acute diverticulitis by Dr. Collene Mares about 10 days prior to this admission and was started on empiric ciprofloxacin and Flagyl. Patient explained she felt initially better with less abdominal pain but diarrhea persisted and she started to develop more nausea resulting in poor oral intake.  In emergency department, patient noted to be hemodynamically stable, blood work notable for acute renal failure with creatinine 7 and BUN 40. Tried hospitalist asked to admit for further evaluation. Nephrologist and gastroenterologist has been consulted for assistance.   Assessment/Plan:    Principal Problem:   Acute on chronic diverticulitis - Patient appears stable this morning, reports feeling week, poor oral intake  - Appreciate gastroenterology team following - completed 4 days of zosyn, d/w ID on call, Dr. Linus Salmons, he agrees with discontinuing antibiotics as patient is nontoxic with no fever and normal WBC - Monitor clinical progress off antibiotics - added probiotic to see if this will help with diarrhea  - provide imodium as needed  - Stool panel still pending Active Problems:   Acute renal failure - Likely progression of prerenal etiology to ATN - Urinalysis unremarkable, renal ultrasound unremarkable - Cr starting to trend down  - Plan to repeat electrolyte panel in the morning - Appreciate nephrologist recommendations and  assistance   Elevated lipase - possibly related to acute problem, diverticulitis - no signs of acute pancreatitis - lipase is trending down, within normal limits this morning   Hypokalemia -  supplemented and within normal limits this morning    Hyponatremia - Likely from prerenal etiology in the setting of hypovolemia - remains stable overall    Metabolic acidosis - responded well to IV bicarb, improving - will repeat BMP in AM   Acute drop in Hg  - possibly from IVF pt has been receiving - no sings of active bleeding, Hg stable over the past 48 hours - anemia panel with iron level in 40's - will repeat CBC in AM   Essential hypertension - Added hydralazine as needed for better blood pressure control - continue Norvasc and Metoprolol     Non severe protein calorie malnutrition - pt tolerating diet well   DVT prophylaxis - Heparin SQ   Code Status: Full.  Family Communication:  plan of care discussed with the patient, daughter and husband at bedside  Disposition Plan: Not ready for discharge  IV access:  Peripheral IV  Procedures and diagnostic studies:    Ct Abdomen Pelvis Wo Contrast  03/24/2015  Extensive sigmoid diverticulosis with findings compatible with superimposed diverticulitis. No abscess or perforation.  Multiple hepatic and bilateral renal cyst. Some of these have increased in size but no aggressive features are identified.  Aortoiliac atherosclerosis without aneurysm.     US Renal  03/24/2015  2.2 cm calcified lesion in the right renal sinus, better visualized on CT, where it measured 2.7 cm. Lesion is unchanged in size since 2008 and therefore likely benign. If definitive characterization is desired, consider follow-up MRI with contrast when renal function improves.  Additional simple bilateral renal cysts, as above.  No hydronephrosis.     Medical Consultants:  GI Nephrology  Other Consultants:  None   IAnti-Infectives:   Zosyn 4/14 -->  4/17  Toni Ramsay, MD  [facility] Pager [phone]  If 7PM-7AM, please contact night-coverage [email or web address] Password [email or web address] 03/28/2015, 1:08 PM   LOS: 4 days   HPI/Subjective: No events overnight. Still with persistent watery diarrhea.  however, decreased in frequency, 2 episodes since last night.   Objective: Filed Vitals:   03/27/15 1344 03/27/15 2129 03/28/15 0612 03/28/15 0944  BP: 164/53 154/54 179/72 136/59  Pulse: 69 73 92 68  Temp: 97.9 F (36.6 C) 98.1 F (36.7 C) 98.2 F (36.8 C)   TempSrc: Oral Oral Oral   Resp: 18 18 20    Height:      Weight:      SpO2: 98% 96% 93%     Intake/Output Summary (Last 24 hours) at 03/28/15 1308 Last data filed at 03/28/15 0086  Gross per 24 hour  Intake 2453.75 ml  Output    960 ml  Net 1493.75 ml    Exam:   General:  Pt is alert, follows commands appropriately, not in acute distress  Cardiovascular: Regular rate and rhythm, , no rubs, no gallops  Respiratory: Clear to auscultation bilaterally, no wheezing, no crackles, no rhonchi  Abdomen: Soft, non tender, non distended, bowel sounds present, no guarding  Extremities: pulses DP and PT palpable bilaterally  Neuro: Grossly nonfocal  Data Reviewed: Basic Metabolic Panel:  Recent Labs Lab 03/24/15 1030 03/24/15 1810 03/25/15 0329 03/26/15 0450 03/27/15 0515 03/28/15 0700  NA 124*  --  127*  126* 131* 134* 131*  K 3.5  --  3.7  3.7 3.3* 3.6 3.9  CL 91*  --  99  99 96 99 100  CO2 20  --  17*  17* 25 25 21   GLUCOSE 108*  --  114*  113* 93 107* 130*  BUN 40*  --  39*  39* 38* 37* 34*  CREATININE 7.10* 6.80* 6.90*  6.84* 7.33* 7.11* 6.66*  CALCIUM 8.0*  --  7.4*  7.7* 6.9* 7.1* 7.2*  PHOS  --   --  6.4* 6.2*  --   --    Liver Function Tests:  Recent Labs Lab 03/24/15 1030 03/25/15 0329 03/26/15 0450  AST 20 21  --   ALT 18 15  --   ALKPHOS 77 80  --   BILITOT 0.7 0.7  --   PROT 6.5 6.4  --   ALBUMIN 3.5 3.4*  3.4* 2.7*    Recent Labs Lab  03/24/15 1030 03/27/15 0515 03/28/15 0700  LIPASE 142* 75* 38   CBC:  Recent Labs Lab 03/24/15 1030 03/24/15 1810 03/25/15 0329 03/26/15 0450 03/27/15 0515 03/28/15 0700  WBC 10.4 9.7 10.3 7.7 7.3 10.5  NEUTROABS 8.5*  --   --   --   --   --   HGB 11.9* 11.4* 12.0 10.6* 10.8* 10.7*  HCT 33.7* 32.7* 34.4* 30.0* 31.1* 31.7*  MCV 86.2 86.7 87.1 86.2 89.1 90.3  PLT 342 286 362 291 292 318   Cardiac Enzymes:  Recent Labs Lab 03/24/15 1810  CKTOTAL 82    Recent Results (from the past 240 hour(s))  Clostridium Difficile by PCR     Status: None   Collection Time: 03/24/15  5:48 PM  Result Value Ref Range Status   C difficile by pcr NEGATIVE NEGATIVE Final  MRSA PCR Screening     Status: Abnormal   Collection Time: 03/24/15  5:48 PM  Result Value Ref Range Status  MRSA by PCR INVALID RESULTS, SPECIMEN SENT FOR CULTURE (A) NEGATIVE Final     Scheduled Meds: . acidophilus  1 capsule Oral Daily  . amLODipine  5 mg Oral Daily  . antiseptic oral rinse  7 mL Mouth Rinse BID  . cycloSPORINE  1 drop Both Eyes BID  . donepezil  5 mg Oral QHS  . heparin  5,000 Units Subcutaneous 3 times per day  . metoprolol tartrate  25 mg Oral BID  . piperacillin-tazobactam (ZOSYN)  IV  2.25 g Intravenous Q8H  . sertraline  50 mg Oral Daily  . sodium chloride  3 mL Intravenous Q12H   Continuous Infusions: . 0.9 % NaCl with KCl 40 mEq / L 50 mL/hr (03/27/15 1419)

## 2015-03-28 NOTE — Progress Notes (Signed)
  [facility] Progress Note   Subjective: creat down to 6.66 today  Filed Vitals:   03/27/15 1344 03/27/15 2129 03/28/15 0612 03/28/15 0944  BP: 164/53 154/54 179/72 136/59  Pulse: 69 73 92 68  Temp: 97.9 F (36.6 C) 98.1 F (36.7 C) 98.2 F (36.8 C)   TempSrc: Oral Oral Oral   Resp: 18 18 20    Height:      Weight:      SpO2: 98% 96% 93%    Exam: Alert, frail, poor memory of events No jvd Chest clear bilat RRR no MRG Abd obese, NTND, +BS No LE or UE edema Neuro is nf, Ox 3    Lab -  UA 1.005, 5.5, 3-6 wbc, 0-2 rbc/hpf UNa 11, UCr 38 US kidneys 12.8/12.8 cm, no hydro, +cysts unchanged Creat 0.9 in Sept 2015   Assessment: 1. Acute renal failure - UA /US unremarkable. Prob ATN due to vol depletion/ GI illness/ ACEi / NSAiD's. Starting to recover. BP's good. No vol excess, making urine. 2. Acute/ chronic diverticulitis - on abx 3. Dementia on aricept 4. Depression on SSRI 5. HTN on norvasc/ MTP  Plan - cont supportive care, labs in am    Kelly Splinter MD  pager [phone]    cell [phone]  03/28/2015, 2:42 PM     Recent Labs Lab 03/25/15 0329 03/26/15 0450 03/27/15 0515 03/28/15 0700  NA 127*  126* 131* 134* 131*  K 3.7  3.7 3.3* 3.6 3.9  CL 99  99 96 99 100  CO2 17*  17* 25 25 21   GLUCOSE 114*  113* 93 107* 130*  BUN 39*  39* 38* 37* 34*  CREATININE 6.90*  6.84* 7.33* 7.11* 6.66*  CALCIUM 7.4*  7.7* 6.9* 7.1* 7.2*  PHOS 6.4* 6.2*  --   --     Recent Labs Lab 03/24/15 1030 03/25/15 0329 03/26/15 0450  AST 20 21  --   ALT 18 15  --   ALKPHOS 77 80  --   BILITOT 0.7 0.7  --   PROT 6.5 6.4  --   ALBUMIN 3.5 3.4*  3.4* 2.7*    Recent Labs Lab 03/24/15 1030  03/26/15 0450 03/27/15 0515 03/28/15 0700  WBC 10.4  < > 7.7 7.3 10.5  NEUTROABS 8.5*  --   --   --   --   HGB 11.9*  < > 10.6* 10.8* 10.7*  HCT 33.7*  < > 30.0* 31.1* 31.7*  MCV 86.2  < > 86.2 89.1 90.3  PLT 342  < > 291 292 318  < > = values in this  interval not displayed. Marland Kitchen acidophilus  1 capsule Oral Daily  . amLODipine  5 mg Oral Daily  . antiseptic oral rinse  7 mL Mouth Rinse BID  . cycloSPORINE  1 drop Both Eyes BID  . donepezil  5 mg Oral QHS  . heparin  5,000 Units Subcutaneous 3 times per day  . metoprolol tartrate  25 mg Oral BID  . nystatin  5 mL Oral QID  . saccharomyces boulardii  250 mg Oral BID  . sertraline  50 mg Oral Daily  . sodium chloride  3 mL Intravenous Q12H   . 0.9 % NaCl with KCl 40 mEq / L 50 mL/hr (03/27/15 1419)   acetaminophen **OR** acetaminophen, alum & mag hydroxide-simeth, loperamide, LORazepam, ondansetron **OR** ondansetron (ZOFRAN) IV, oxyCODONE

## 2015-03-28 NOTE — Progress Notes (Signed)
Acute hypoxic respiratory failure, sudden onset of hypoxia 88% on RA requiring placement on 2-4 L [place]. CXR with pulmonary vascular congestion. Will give one dose of Lasix 20 mg IV now, stop IVF and monitor clinical response.   [name], MD  [facility] Pager [phone]  If 7PM-7AM, please contact night-coverage [email or web address] Password [email or web address]

## 2015-03-28 NOTE — Progress Notes (Signed)
In the past 12 hours pt had one small amount of brown liquid emesis relieved after zofran.  And 2 small liquid brown stools which made a total of 4 in the past 24 hours.

## 2015-03-28 NOTE — Progress Notes (Signed)
Physical Therapy Treatment Patient Details Name: Toni [name] MRN: [id number] DOB: [birth year] Today's Date: 03/28/2015    History of Present Illness 81 y/o WF admitted with diverticulitis after having persistant diarrhea.    PT Comments    Able to increase distance on today. Pt remains intermittently unsteady and demonstrates some overall decreased activity tolerance. Dyspnea 2/4 after walking. Encouraged pt to walk 3x/day (pt agreeable to 2x/day)-made nursing aware of this.   Follow Up Recommendations  Home health PT vs No PT follow up (depending on progress)     Equipment Recommendations  None recommended by PT    Recommendations for Other Services       Precautions / Restrictions Precautions Precautions: Fall Restrictions Weight Bearing Restrictions: No    Mobility  Bed Mobility Overal bed mobility: Modified Independent                Transfers Overall transfer level: Needs assistance   Transfers: Sit to/from Stand Sit to Stand: Supervision            Ambulation/Gait Ambulation/Gait assistance: Min guard Ambulation Distance (Feet): 350 Feet Assistive device:  (IV pole) Gait Pattern/deviations: Step-through pattern     General Gait Details: still unsteady at times (changes in direction) so continued use of IV pole for support. Dyspnea 2/4.    Stairs            Wheelchair Mobility    Modified Rankin (Stroke Patients Only)       Balance Overall balance assessment: Needs assistance         Standing balance support: During functional activity Standing balance-Leahy Scale: Good                      Cognition Arousal/Alertness: Awake/alert Behavior During Therapy: WFL for tasks assessed/performed Overall Cognitive Status: Within Functional Limits for tasks assessed                      Exercises      General Comments        Pertinent Vitals/Pain Pain Assessment: Faces Faces Pain Scale: Hurts little  more Pain Location: headache Pain Intervention(s): Monitored during session;Patient requesting pain meds-RN notified    Home Living                      Prior Function            PT Goals (current goals can now be found in the care plan section) Progress towards PT goals: Progressing toward goals    Frequency  Min 3X/week    PT Plan Current plan remains appropriate    Co-evaluation             End of Session   Activity Tolerance: Patient tolerated treatment well Patient left: in bed;with call bell/phone within reach;with family/visitor present     Time: 1040-1058 PT Time Calculation (min) (ACUTE ONLY): 18 min  Charges:  $Gait Training: 8-22 mins                    G Codes:      Weston Anna, MPT Pager: [phone]

## 2015-03-29 ENCOUNTER — Inpatient Hospital Stay (HOSPITAL_COMMUNITY): Payer: Medicare Other

## 2015-03-29 LAB — BASIC METABOLIC PANEL
Anion gap: 11 (ref 5–15)
BUN: 34 mg/dL — ABNORMAL HIGH (ref 6–23)
CO2: 21 mmol/L (ref 19–32)
CREATININE: 6.39 mg/dL — AB (ref 0.50–1.10)
Calcium: 7.9 mg/dL — ABNORMAL LOW (ref 8.4–10.5)
Chloride: 103 mmol/L (ref 96–112)
GFR calc Af Amer: 6 mL/min — ABNORMAL LOW (ref >90)
GFR, EST NON AFRICAN AMERICAN: 6 mL/min — AB (ref >90)
Glucose, Bld: 113 mg/dL — ABNORMAL HIGH (ref 70–99)
POTASSIUM: 4.1 mmol/L (ref 3.5–5.1)
SODIUM: 135 mmol/L (ref 135–145)

## 2015-03-29 LAB — CBC
HEMATOCRIT: 32.6 % — AB (ref 36.0–46.0)
Hemoglobin: 11 g/dL — ABNORMAL LOW (ref 12.0–15.0)
MCH: 30.6 pg (ref 26.0–34.0)
MCHC: 33.7 g/dL (ref 30.0–36.0)
MCV: 90.8 fL (ref 78.0–100.0)
PLATELETS: 371 10*3/uL (ref 150–400)
RBC: 3.59 MIL/uL — AB (ref 3.87–5.11)
RDW: 13.2 % (ref 11.5–15.5)
WBC: 12 10*3/uL — AB (ref 4.0–10.5)

## 2015-03-29 LAB — GI PATHOGEN PANEL BY PCR, STOOL
C difficile toxin A/B: NOT DETECTED
CRYPTOSPORIDIUM BY PCR: NOT DETECTED
Campylobacter by PCR: NOT DETECTED
E coli (ETEC) LT/ST: NOT DETECTED
E coli (STEC): NOT DETECTED
E coli 0157 by PCR: NOT DETECTED
G LAMBLIA BY PCR: NOT DETECTED
NOROVIRUS G1/G2: NOT DETECTED
Rotavirus A by PCR: NOT DETECTED
SALMONELLA BY PCR: NOT DETECTED
SHIGELLA BY PCR: NOT DETECTED

## 2015-03-29 MED ORDER — FUROSEMIDE 10 MG/ML IJ SOLN
80.0000 mg | Freq: Three times a day (TID) | INTRAMUSCULAR | Status: DC
  Administered 2015-03-29 – 2015-03-30 (×4): 80 mg via INTRAVENOUS
  Filled 2015-03-29 (×4): qty 8

## 2015-03-29 MED ORDER — FUROSEMIDE 10 MG/ML IJ SOLN: 20.0000 mg | Freq: Every day | INTRAMUSCULAR | Status: DC

## 2015-03-29 MED ORDER — FUROSEMIDE 10 MG/ML IJ SOLN
80.0000 mg | Freq: Three times a day (TID) | INTRAMUSCULAR | Status: DC
  Administered 2015-03-29: 80 mg via INTRAVENOUS
  Filled 2015-03-29: qty 8

## 2015-03-29 MED ORDER — FUROSEMIDE 10 MG/ML IJ SOLN: 40.0000 mg | Freq: Two times a day (BID) | INTRAMUSCULAR | Status: DC

## 2015-03-29 NOTE — Progress Notes (Addendum)
Patient ID: AMBYR QADRI, female   DOB: [birth year], 81 y.o.   MRN: [id number]  [facility] PROGRESS NOTE  SAREN CORKERN BSJ:[id number] DOB: [birth year] DOA: 03/24/2015 PCP: Unice Cobble, MD   Brief narrative:    81 y.o. female history of diverticulosis, HTN, HLD, presented to emergency department with main concern of progressively worsening weakness and ongoing diarrhea. She was diagnosed with acute diverticulitis by Dr. Collene Mares about 10 days prior to this admission and was started on empiric ciprofloxacin and Flagyl. Patient explained she felt initially better with less abdominal pain but diarrhea persisted and she started to develop more nausea resulting in poor oral intake.  In emergency department, patient noted to be hemodynamically stable, blood work notable for acute renal failure with creatinine 7 and BUN 40. Tried hospitalist asked to admit for further evaluation. Nephrologist and gastroenterologist has been consulted for assistance.  Major events since admission: 4/18 - sudden onset of acute respiratory failure with hypoxia, chest x-ray consistent with pulmonary vascular congestion, IV fluids stopped, Lasix started 4/19 - respiratory status more stable, still vascular congestion on exam, Lasix increased to 80 mg 3 times a day per nephrology team   Assessment/Plan:    Principal Problem:   Acute hypoxic respiratory failure, on 03/28/2015 - Secondary to pulmonary vascular congestion from IV fluids patient has been receiving and ? Underlying PNA  - Weight on admission 175 lbs --> 188 lbs this am - Lasix started and dose increased to 80 mg IV 3 times a day per nephrology team - Creatinine continues trending down - Monitor fluid status closely, daily weights, strict I/O   - 2-D echo requested for further  evaluation of heart function   Acute on chronic diverticulitis - Patient appears stable this morning, reports feeling week, poor oral intake  - Appreciate gastroenterology team following - completed 4 days of zosyn, d/w ID on call, Dr. Linus Salmons, he agrees with discontinuing antibiotics as patient is nontoxic with no fever and normal WBC - All antibiotics stopped 03/27/2015 - Stool panel still pending - Continue Imodium as needed  Active Problems:   ? developing PNA, left lobar PNA - with WBC slightly up this AM over the past 24 hours and CXR findings concerning for PNA - Certainly consideration however, patient very reluctant to initiating antibiotics (says she has been on flagyl and cipro for 10 days and has been on zosyn for 4 days while here) - Very low threshold for initiating antibiotic if clinical status deteriorates - Patient is currently afebrile and denies any chest pain or cough - I have discussed with patient if WBC persistently elevated or if she develops fever, will need to consider restarting antibiotics for pneumonia - Plan is to repeat CBC in the morning, monitor vital signs closely, please notify physician immediately for any temperature greater than 100.5 F   Acute renal failure - Likely progression of prerenal etiology to ATN - Urinalysis unremarkable, renal ultrasound unremarkable - Cr continues trending down - Plan to repeat electrolyte panel in the morning - Appreciate nephrologist recommendations and assistance   Elevated lipase - possibly related to acute problem, diverticulitis - no signs of acute pancreatitis - lipase is trending down, within normal limits 4/18   Hypokalemia -  supplemented and within normal limits this morning    Hyponatremia - Likely from prerenal etiology in the setting of hypovolemia - remains stable overall    Metabolic acidosis - responded well to IV bicarb, improving - will repeat BMP in AM  Acute drop in Hg  - possibly from IVF pt  has been receiving - no sings of active bleeding, Hg stable over the past 48 hours - anemia panel with iron level in 40's - will repeat CBC in AM   Essential hypertension - Added hydralazine as needed for better blood pressure control - continue Norvasc and Metoprolol     Non severe protein calorie malnutrition - in the context of acute illness - pt tolerating diet well    Obesity  - Body mass index is 31.33 kg/(m^2).  DVT prophylaxis - Heparin SQ   Code Status: Full.  Family Communication:  plan of care discussed with the patient, daughter and husband at bedside  Disposition Plan: Barriers to discharge - patient developed acute respiratory failure with hypoxia on 03/28/2015 secondary to pulmonary vascular congestion, still requiring IV diuresis. Also very low threshold for requiring initiating antibiotics due to potential pneumonia. Please note that patient is very reluctant to start antibiotics but is aware it may be a necessity for her clinical condition continues to deteriorate. Once back to baseline dry weight and off oxygen, can be discharged home.   IV access:  Peripheral IV  Procedures and diagnostic studies:    Ct Abdomen Pelvis Wo Contrast  03/24/2015  Extensive sigmoid diverticulosis with findings compatible with superimposed diverticulitis. No abscess or perforation.  Multiple hepatic and bilateral renal cyst. Some of these have increased in size but no aggressive features are identified.  Aortoiliac atherosclerosis without aneurysm.     US Renal  03/24/2015  2.2 cm calcified lesion in the right renal sinus, better visualized on CT, where it measured 2.7 cm. Lesion is unchanged in size since 2008 and therefore likely benign. If definitive characterization is desired, consider follow-up MRI with contrast when renal function improves.  Additional simple bilateral renal cysts, as above.  No hydronephrosis.     Dg Chest 2 View  03/28/2015  volume overload and CHF. Followup studies in  the short-term are recommended to ensure resolution of these findings as there is prominence of the mediastinal and hilar soft tissues.     Dg Chest Port 1 View  03/29/2015   Pulmonary edema likely of cardiac cause more conspicuous today. Perihilar alveolar opacities on the right and increased retrocardiac density on the left may reflect pneumonia.    Medical Consultants:  GI Nephrology   Other Consultants:  None   IAnti-Infectives:   Zosyn 4/14 --> 4/17  [name], MD  [facility] Pager [phone]  If 7PM-7AM, please contact night-coverage [email or web address] Password [facility] 03/29/2015, 6:43 PM   LOS: 5 days   HPI/Subjective: No events overnight. Still with persistent watery diarrhea.  however, decreased in frequency, 2 episodes since last night.   Objective: Filed Vitals:   03/29/15 0641 03/29/15 0948 03/29/15 1227 03/29/15 1316  BP: 132/71   153/66  Pulse: 86   73  Temp: 98.1 F (36.7 C) 97.5 F (36.4 C)  97.6 F (36.4 C)  TempSrc: Oral Oral  Oral  Resp: 22   20  Height:      Weight:      SpO2: 94%  93% 94%    Intake/Output Summary (Last 24 hours) at 03/29/15 1843 Last data filed at 03/29/15 1659  Gross per 24 hour  Intake    480 ml  Output   3350 ml  Net  -2870 ml    Exam:   General:  Pt is alert, follows commands appropriately, not in acute distress  Cardiovascular: Regular rate  and rhythm, , no rubs, no gallops  Respiratory: Clear to auscultation bilaterally, no wheezing, bibasilar crackles   Abdomen: Soft, non tender, non distended, bowel sounds present, no guarding  Extremities: pulses DP and PT palpable bilaterally  Neuro: Grossly nonfocal  Data Reviewed: Basic Metabolic Panel:  Recent Labs Lab 03/25/15 0329 03/26/15 0450 03/27/15 0515 03/28/15 0700 03/29/15 0555  NA 127*  126* 131* 134* 131* 135  K 3.7  3.7 3.3* 3.6 3.9 4.1  CL 99  99 96 99 100 103  CO2 17*  17* 25 25 21 21   GLUCOSE 114*  113* 93 107* 130* 113*  BUN 39*  39* 38*  37* 34* 34*  CREATININE 6.90*  6.84* 7.33* 7.11* 6.66* 6.39*  CALCIUM 7.4*  7.7* 6.9* 7.1* 7.2* 7.9*  PHOS 6.4* 6.2*  --   --   --    Liver Function Tests:  Recent Labs Lab 03/24/15 1030 03/25/15 0329 03/26/15 0450  AST 20 21  --   ALT 18 15  --   ALKPHOS 77 80  --   BILITOT 0.7 0.7  --   PROT 6.5 6.4  --   ALBUMIN 3.5 3.4*  3.4* 2.7*    Recent Labs Lab 03/24/15 1030 03/27/15 0515 03/28/15 0700  LIPASE 142* 75* 38   CBC:  Recent Labs Lab 03/24/15 1030  03/25/15 0329 03/26/15 0450 03/27/15 0515 03/28/15 0700 03/29/15 0555  WBC 10.4  < > 10.3 7.7 7.3 10.5 12.0*  NEUTROABS 8.5*  --   --   --   --   --   --   HGB 11.9*  < > 12.0 10.6* 10.8* 10.7* 11.0*  HCT 33.7*  < > 34.4* 30.0* 31.1* 31.7* 32.6*  MCV 86.2  < > 87.1 86.2 89.1 90.3 90.8  PLT 342  < > 362 291 292 318 371  < > = values in this interval not displayed. Cardiac Enzymes:  Recent Labs Lab 03/24/15 1810  CKTOTAL 82    Recent Results (from the past 240 hour(s))  Clostridium Difficile by PCR     Status: None   Collection Time: 03/24/15  5:48 PM  Result Value Ref Range Status   C difficile by pcr NEGATIVE NEGATIVE Final  MRSA PCR Screening     Status: Abnormal   Collection Time: 03/24/15  5:48 PM  Result Value Ref Range Status   MRSA by PCR INVALID RESULTS, SPECIMEN SENT FOR CULTURE (A) NEGATIVE Final     Scheduled Meds: . acidophilus  1 capsule Oral Daily  . amLODipine  5 mg Oral Daily  . antiseptic oral rinse  7 mL Mouth Rinse BID  . cycloSPORINE  1 drop Both Eyes BID  . donepezil  5 mg Oral QHS  . furosemide  80 mg Intravenous Q8H  . heparin  5,000 Units Subcutaneous 3 times per day  . metoprolol tartrate  25 mg Oral BID  . nystatin  5 mL Oral QID  . saccharomyces boulardii  250 mg Oral BID  . sertraline  50 mg Oral Daily  . sodium chloride  3 mL Intravenous Q12H    Continuous Infusions:

## 2015-03-29 NOTE — Progress Notes (Signed)
[facility] Progress Note   Subjective: creat down to 6.3, +anorexia and fatigue  Filed Vitals:   03/29/15 0641 03/29/15 0948 03/29/15 1227 03/29/15 1316  BP: 132/71   153/66  Pulse: 86   73  Temp: 98.1 F (36.7 C) 97.5 F (36.4 C)  97.6 F (36.4 C)  TempSrc: Oral Oral  Oral  Resp: 22   20  Height:      Weight:      SpO2: 94%  93% 94%   Exam: Alert, frail, poor memory of events No jvd Chest faint bibasilar rales RRR no MRG Abd obese, NTND, +BS 1+ pitting LE edema bilat Neuro is nf, Ox 3  UA 1.005, 5.5, 3-6 wbc, 0-2 rbc/hpf UNa 11, UCr 38 US kidneys 12.8/12.8 cm, no hydro, +cysts unchanged Creat 0.9 in Sept 2015    Assessment: 1. Acute renal failure - severe episode w no prior renal history, creat 0.9 in Sept 2015.  UA /US unremarkable. Prob ATN due to vol depletion/ GI illness/ ACEi / NSAiD's.  Starting to recover, prognosis still guarded from renal standpoint.  +pulm edema on 4L [place], needs lasix IV -  have increased to 80mg  every 8 hours for now.   2. Acute/ chronic diverticulitis - s/p abx 3. Dementia on aricept 4. Depression on SSRI 5. HTN on norvasc/ MTP 6. Anorexia/ fatigue - prob uremic symptoms  Plan - lasix increased, daily labs. No indication for RRT. Hopefully will cont to improve.  May need foley if starting to diurese significantly.     Kelly Splinter MD  pager [phone]    cell [phone]  03/29/2015, 3:30 PM     Recent Labs Lab 03/25/15 0329 03/26/15 0450 03/27/15 0515 03/28/15 0700 03/29/15 0555  NA 127*  126* 131* 134* 131* 135  K 3.7  3.7 3.3* 3.6 3.9 4.1  CL 99  99 96 99 100 103  CO2 17*  17* 25 25 21 21   GLUCOSE 114*  113* 93 107* 130* 113*  BUN 39*  39* 38* 37* 34* 34*  CREATININE 6.90*  6.84* 7.33* 7.11* 6.66* 6.39*  CALCIUM 7.4*  7.7* 6.9* 7.1* 7.2* 7.9*  PHOS 6.4* 6.2*  --   --   --     Recent Labs Lab 03/24/15 1030 03/25/15 0329 03/26/15 0450  AST 20 21  --   ALT 18 15  --   ALKPHOS 77 80  --    BILITOT 0.7 0.7  --   PROT 6.5 6.4  --   ALBUMIN 3.5 3.4*  3.4* 2.7*    Recent Labs Lab 03/24/15 1030  03/27/15 0515 03/28/15 0700 03/29/15 0555  WBC 10.4  < > 7.3 10.5 12.0*  NEUTROABS 8.5*  --   --   --   --   HGB 11.9*  < > 10.8* 10.7* 11.0*  HCT 33.7*  < > 31.1* 31.7* 32.6*  MCV 86.2  < > 89.1 90.3 90.8  PLT 342  < > 292 318 371  < > = values in this interval not displayed. Marland Kitchen acidophilus  1 capsule Oral Daily  . amLODipine  5 mg Oral Daily  . antiseptic oral rinse  7 mL Mouth Rinse BID  . cycloSPORINE  1 drop Both Eyes BID  . donepezil  5 mg Oral QHS  . furosemide  40 mg Intravenous BID  . heparin  5,000 Units Subcutaneous 3 times per day  . metoprolol tartrate  25 mg Oral BID  . nystatin  5 mL  Oral QID  . saccharomyces boulardii  250 mg Oral BID  . sertraline  50 mg Oral Daily  . sodium chloride  3 mL Intravenous Q12H     acetaminophen **OR** acetaminophen, alum & mag hydroxide-simeth, levalbuterol, loperamide, LORazepam, ondansetron **OR** ondansetron (ZOFRAN) IV, oxyCODONE

## 2015-03-29 NOTE — Progress Notes (Signed)
Unable to maintain oxygen saturation above 90. She was transitioned from 6L Oxygen via [place] to venturi mask to partial non rebreather. Oxygen saturation at 95%. Pt denies SOB yet still tachypneic.

## 2015-03-30 DIAGNOSIS — N179 Acute kidney failure, unspecified: Secondary | ICD-10-CM | POA: Diagnosis present

## 2015-03-30 DIAGNOSIS — J811 Chronic pulmonary edema: Secondary | ICD-10-CM | POA: Insufficient documentation

## 2015-03-30 DIAGNOSIS — N170 Acute kidney failure with tubular necrosis: Principal | ICD-10-CM

## 2015-03-30 DIAGNOSIS — R0600 Dyspnea, unspecified: Secondary | ICD-10-CM

## 2015-03-30 DIAGNOSIS — J810 Acute pulmonary edema: Secondary | ICD-10-CM

## 2015-03-30 LAB — CBC
HEMATOCRIT: 31.1 % — AB (ref 36.0–46.0)
HEMOGLOBIN: 10.8 g/dL — AB (ref 12.0–15.0)
MCH: 31 pg (ref 26.0–34.0)
MCHC: 34.7 g/dL (ref 30.0–36.0)
MCV: 89.4 fL (ref 78.0–100.0)
Platelets: 344 10*3/uL (ref 150–400)
RBC: 3.48 MIL/uL — ABNORMAL LOW (ref 3.87–5.11)
RDW: 13.1 % (ref 11.5–15.5)
WBC: 8.7 10*3/uL (ref 4.0–10.5)

## 2015-03-30 LAB — BASIC METABOLIC PANEL
Anion gap: 13 (ref 5–15)
BUN: 37 mg/dL — AB (ref 6–23)
CALCIUM: 8.1 mg/dL — AB (ref 8.4–10.5)
CO2: 25 mmol/L (ref 19–32)
Chloride: 98 mmol/L (ref 96–112)
Creatinine, Ser: 6.21 mg/dL — ABNORMAL HIGH (ref 0.50–1.10)
GFR calc Af Amer: 7 mL/min — ABNORMAL LOW (ref >90)
GFR, EST NON AFRICAN AMERICAN: 6 mL/min — AB (ref >90)
GLUCOSE: 101 mg/dL — AB (ref 70–99)
Potassium: 3.3 mmol/L — ABNORMAL LOW (ref 3.5–5.1)
Sodium: 136 mmol/L (ref 135–145)

## 2015-03-30 MED ORDER — POTASSIUM CHLORIDE CRYS ER 20 MEQ PO TBCR
20.0000 meq | EXTENDED_RELEASE_TABLET | Freq: Once | ORAL | Status: AC
  Administered 2015-03-30: 20 meq via ORAL
  Filled 2015-03-30: qty 1

## 2015-03-30 MED ORDER — FUROSEMIDE 10 MG/ML IJ SOLN
40.0000 mg | Freq: Three times a day (TID) | INTRAMUSCULAR | Status: AC
  Administered 2015-03-31: 40 mg via INTRAVENOUS
  Filled 2015-03-30: qty 4

## 2015-03-30 NOTE — Progress Notes (Signed)
CARE MANAGEMENT NOTE 03/30/2015  Patient:  Toni [name], Toni [name]   Account Number:  [id number]  Date Initiated:  03/25/2015  Documentation initiated by:  DAVIS,RHONDA  Subjective/Objective Assessment:   aki due to dehydration     Action/Plan:   home when stable   Anticipated DC Date:  04/01/2015   Anticipated DC Plan:  [place]  In-house referral  NA      DC Planning Services  CM consult      Choice offered to / List presented to:  C-1 Patient        [place] arranged  [place] PT      [place].   Status of service:  In process, will continue to follow Medicare Important Message given?   (If response is "NO", the following Medicare IM given date fields will be blank) Date Medicare IM given:   Medicare IM given by:   Date Additional Medicare IM given:   Additional Medicare IM given by:    Discharge Disposition:    Per UR Regulation:  Reviewed for med. necessity/level of care/duration of stay  If discussed at [place] of Stay Meetings, dates discussed:   03/29/2015    Comments:  03/30/15 Dessa Phi RN BSN NCM [phone] [facility] chosen for [place].TC Kristen aware of HHPT order.Await d/c order.On 02 will monitor if qualfies can arrange.  March 26, 2015/Rhonda L. Rosana Hoes, RN, BSN, CCM. Case Management [place] [phone] No discharge needs present of time of review. patient with acute renal failure with SIADH.  March 25, 2015/Rhonda L. Rosana Hoes, RN, BSN, CCM. Case Management [place] [phone] No discharge needs present of time of review.

## 2015-03-30 NOTE — Progress Notes (Signed)
[facility] Progress Note   Subjective: diuresed well, SOB much better. Creat down 6.2  Filed Vitals:   03/29/15 1316 03/29/15 2225 03/30/15 0420 03/30/15 1330  BP: 153/66 141/65 143/60 142/79  Pulse: 73 66 72 70  Temp: 97.6 F (36.4 C) 98.7 F (37.1 C) 98.3 F (36.8 C) 97.9 F (36.6 C)  TempSrc: Oral Oral Oral Oral  Resp: 20 20 20 20   Height:      Weight:   82.9 kg (182 lb 12.2 oz)   SpO2: 94% 96% 94% 94%   Exam: Alert, frail, poor memory of events No jvd Chest faint bibasilar rales RRR no MRG Abd obese, NTND, +BS 1+ pitting LE edema bilat Neuro is nf, Ox 3  UA 1.005, 5.5, 3-6 wbc, 0-2 rbc/hpf UNa 11, UCr 38 US kidneys 12.8/12.8 cm, no hydro, +cysts unchanged Creat 0.9 in Sept 2015    Assessment: 1. Acute renal failure - severe episode w no prior renal history, creat 0.9 in Sept 2015.  UA /US unremarkable. ATN due to vol depletion/ GI illness / ARB / NSAiD's.  Continue to avoid nephrotoxins, will decrease lasix today and prob dc tomorrow. Volume status improving 2. Acute resp faillure / pulm edema - as above   3. Acute/ chronic diverticulitis - s/p abx 4. Dementia on aricept 5. Depression on SSRI 6. HTN on norvasc/ MTP 7. Anorexia/ fatigue - prob uremic symptoms  Plan - as above    Kelly Splinter MD  pager [phone]    cell [phone]  03/30/2015, 3:46 PM     Recent Labs Lab 03/25/15 0329 03/26/15 0450  03/28/15 0700 03/29/15 0555 03/30/15 0416  NA 127*  126* 131*  < > 131* 135 136  K 3.7  3.7 3.3*  < > 3.9 4.1 3.3*  CL 99  99 96  < > 100 103 98  CO2 17*  17* 25  < > 21 21 25   GLUCOSE 114*  113* 93  < > 130* 113* 101*  BUN 39*  39* 38*  < > 34* 34* 37*  CREATININE 6.90*  6.84* 7.33*  < > 6.66* 6.39* 6.21*  CALCIUM 7.4*  7.7* 6.9*  < > 7.2* 7.9* 8.1*  PHOS 6.4* 6.2*  --   --   --   --   < > = values in this interval not displayed.  Recent Labs Lab 03/24/15 1030 03/25/15 0329 03/26/15 0450  AST 20 21  --   ALT 18 15  --    ALKPHOS 77 80  --   BILITOT 0.7 0.7  --   PROT 6.5 6.4  --   ALBUMIN 3.5 3.4*  3.4* 2.7*    Recent Labs Lab 03/24/15 1030  03/28/15 0700 03/29/15 0555 03/30/15 0416  WBC 10.4  < > 10.5 12.0* 8.7  NEUTROABS 8.5*  --   --   --   --   HGB 11.9*  < > 10.7* 11.0* 10.8*  HCT 33.7*  < > 31.7* 32.6* 31.1*  MCV 86.2  < > 90.3 90.8 89.4  PLT 342  < > 318 371 344  < > = values in this interval not displayed. Marland Kitchen acidophilus  1 capsule Oral Daily  . amLODipine  5 mg Oral Daily  . antiseptic oral rinse  7 mL Mouth Rinse BID  . cycloSPORINE  1 drop Both Eyes BID  . donepezil  5 mg Oral QHS  . furosemide  80 mg Intravenous Q8H  . heparin  5,000 Units  Subcutaneous 3 times per day  . metoprolol tartrate  25 mg Oral BID  . nystatin  5 mL Oral QID  . saccharomyces boulardii  250 mg Oral BID  . sertraline  50 mg Oral Daily  . sodium chloride  3 mL Intravenous Q12H     acetaminophen **OR** acetaminophen, alum & mag hydroxide-simeth, levalbuterol, loperamide, LORazepam, ondansetron **OR** ondansetron (ZOFRAN) IV, oxyCODONE

## 2015-03-30 NOTE — Progress Notes (Addendum)
Physical Therapy Treatment Patient Details Name: Toni [name] MRN: [id number] DOB: 1934-05-19 Today's Date: 03/30/2015    History of Present Illness 81 y/o WF admitted with diverticulitis after having persistant diarrhea.    PT Comments    Pt required Min assist to ambulate without external support/device on today. Also required 1L O2-sats dropped to 87-88%. Pt tolerated activity well. Needs to walk more with nursing supervision-pt requires Mod encouragement to mobilize. Recommend HHPT. May need to consider assistive device if pt remains unsteady.   Follow Up Recommendations  Home health PT; Supervision for OOB/mobility     Equipment Recommendations  None recommended by PT    Recommendations for Other Services       Precautions / Restrictions Precautions Precautions: Fall Restrictions Weight Bearing Restrictions: No    Mobility  Bed Mobility Overal bed mobility: Modified Independent                Transfers Overall transfer level: Needs assistance Equipment used: None   Sit to Stand: Min guard         General transfer comment: close guard for safety  Ambulation/Gait Ambulation/Gait assistance: Min assist Ambulation Distance (Feet): 285 Feet Assistive device: None Gait Pattern/deviations: Step-through pattern;Drifts right/left;Staggering left;Staggering right     General Gait Details: unsteady without external support. therapist provided assist to stabilize intemittently. Ambulated on 1L -sats 87-88%.    Stairs            Wheelchair Mobility    Modified Rankin (Stroke Patients Only)       Balance                                    Cognition Arousal/Alertness: Awake/alert Behavior During Therapy: WFL for tasks assessed/performed Overall Cognitive Status: Within Functional Limits for tasks assessed                      Exercises      General Comments        Pertinent Vitals/Pain Pain Assessment:  No/denies pain    Home Living                      Prior Function            PT Goals (current goals can now be found in the care plan section) Progress towards PT goals: Progressing toward goals    Frequency  Min 3X/week    PT Plan Current plan remains appropriate    Co-evaluation             End of Session   Activity Tolerance: Patient tolerated treatment well Patient left: in bed;with call bell/phone within reach;with family/visitor present     Time: 2233-6122 PT Time Calculation (min) (ACUTE ONLY): 10 min  Charges:  $Gait Training: 8-22 mins                    G Codes:      Weston Anna, MPT Pager: [phone]

## 2015-03-30 NOTE — Progress Notes (Signed)
  Echocardiogram 2D Echocardiogram has been performed.  Donata Clay 03/30/2015, 2:28 PM

## 2015-03-30 NOTE — Progress Notes (Signed)
Progress Note   PHUNG KOTAS LOV:[id number] DOB: [birth year] DOA: 03/24/2015 PCP: Unice Cobble, MD   Brief Narrative:   GORGEOUS NEWLUN is an 81 y.o. female with a PMH of diverticulosis/recent treatment of diverticulitis with Cipro/Flagyl, HTN, HLD, was admitted 03/24/15 with chief complaint of weakness and ongoing diarrhea. In the ED, patient noted to be hemodynamically stable, blood work notable for acute renal failure with creatinine 7 and BUN 40.  Nephrologist and gastroenterologist was consulted.  Major events since admission: 4/18 - sudden onset of acute respiratory failure with hypoxia, chest x-ray consistent with pulmonary vascular congestion, IV fluids stopped, Lasix started. 4/19 - respiratory status more stable, still vascular congestion on exam, Lasix increased to 80 mg 3 times a day per nephrology team.  Assessment/Plan:   Principal Problem:   Diverticulitis / diverticulosis of large intestine - Being followed by gastroenterology. - S/P 4 days of zosyn, which was discontinued 03/27/15 in the setting of 10 days of outpatient therapy with Cipro/Flagyl. - Stool panel negative. - Continue Imodium as needed.   Active Problems:  Acute hypoxic respiratory failure, on 03/28/2015 - Secondary to pulmonary vascular congestion from IV fluids, no evidence of clinical PNA.  - Weight gain of 8 pounds noted from admission values. - Lasix started and dose increased to 80 mg IV 3 times a day per nephrology team with good diuresis noted. - Creatinine continues trending down. - Monitor fluid status closely, daily weights, strict I/O.  I/O- 4.7 L/24 hours.  - 2-D echo requested pending.   Acute renal failure - Likely secondary to ATN. - Urinalysis unremarkable, renal ultrasound unremarkable. - Cr continues trending down. - Nephrology following.   Elevated lipase - Resolved.   Hypokalemia - K DUR x 1 today.   Hyponatremia - Resolved.   Metabolic  acidosis - Treated with IV bicarbonate infusion. Bicarbonate remains WNL.   Normocytic anemia  - Status post anemia panel with iron level in 40's. - Hemoglobin stable with no indication for transfusion.   Essential hypertension - Continue when necessary hydralazine for better blood pressure control. - Continue Norvasc and Metoprolol.    Non severe protein calorie malnutrition/obesity - Body mass index is 31.33 kg/(m^2). - Continue to monitor nutritional status.    DVT prophylaxis  - Heparin SQ   Code Status: Full.  Family Communication: Daughter Rodena Piety)  and husband Mikki Santee) at bedside.  Disposition Plan: Once back to baseline dry weight and off oxygen, can be discharged home.    IV Access:    Peripheral IV   Procedures and diagnostic studies:   Ct Abdomen Pelvis Wo Contrast 03/24/2015: Extensive sigmoid diverticulosis with findings compatible with superimposed diverticulitis. No abscess or perforation. Multiple hepatic and bilateral renal cyst. Some of these have increased in size but no aggressive features are identified. Aortoiliac atherosclerosis without aneurysm.   US Renal 03/24/2015: 2.2 cm calcified lesion in the right renal sinus, better visualized on CT, where it measured 2.7 cm. Lesion is unchanged in size since 2008 and therefore likely benign. If definitive characterization is desired, consider follow-up MRI with contrast when renal function improves. Additional simple bilateral renal cysts, as above. No hydronephrosis.   Dg Chest 2 View 03/28/2015:  Volume overload and CHF. Followup studies in the short-term are recommended to ensure resolution of these findings as there is prominence of the mediastinal and hilar soft tissues.   Dg Chest Port 1 View 03/29/2015: Pulmonary edema likely of cardiac cause more conspicuous today. Perihilar alveolar opacities  on the right and increased retrocardiac density on the left may reflect pneumonia   Medical  Consultants:    Carol Ada, GI.  Donato Heinz, Neprhology  Anti-Infectives:    Zosyn 03/24/15---> 03/27/15  Subjective:   Jolayne Haines feels better. Diarrhea has improved. No nausea or vomiting. Appetite fair. No abdominal pain. Reports that her respiratory status is much better.  Objective:    Filed Vitals:   03/29/15 1227 03/29/15 1316 03/29/15 2225 03/30/15 0420  BP:  153/66 141/65 143/60  Pulse:  73 66 72  Temp:  97.6 F (36.4 C) 98.7 F (37.1 C) 98.3 F (36.8 C)  TempSrc:  Oral Oral Oral  Resp:  20 20 20   Height:      Weight:    82.9 kg (182 lb 12.2 oz)  SpO2: 93% 94% 96% 94%    Intake/Output Summary (Last 24 hours) at 03/30/15 0849 Last data filed at 03/30/15 0730  Gross per 24 hour  Intake    480 ml  Output   5275 ml  Net  -4795 ml    Exam: Gen:  NAD Cardiovascular:  RRR, No M/R/G Respiratory:  Lungs CTAB, no rales Gastrointestinal:  Abdomen soft, NT/ND, + BS Extremities:  Trace - 1+ edema   Data Reviewed:    Labs: Basic Metabolic Panel:  Recent Labs Lab 03/25/15 0329 03/26/15 0450 03/27/15 0515 03/28/15 0700 03/29/15 0555 03/30/15 0416  NA 127*  126* 131* 134* 131* 135 136  K 3.7  3.7 3.3* 3.6 3.9 4.1 3.3*  CL 99  99 96 99 100 103 98  CO2 17*  17* 25 25 21 21 25   GLUCOSE 114*  113* 93 107* 130* 113* 101*  BUN 39*  39* 38* 37* 34* 34* 37*  CREATININE 6.90*  6.84* 7.33* 7.11* 6.66* 6.39* 6.21*  CALCIUM 7.4*  7.7* 6.9* 7.1* 7.2* 7.9* 8.1*  PHOS 6.4* 6.2*  --   --   --   --    GFR Estimated Creatinine Clearance: 7.8 mL/min (by C-G formula based on Cr of 6.21). Liver Function Tests:  Recent Labs Lab 03/24/15 1030 03/25/15 0329 03/26/15 0450  AST 20 21  --   ALT 18 15  --   ALKPHOS 77 80  --   BILITOT 0.7 0.7  --   PROT 6.5 6.4  --   ALBUMIN 3.5 3.4*  3.4* 2.7*    Recent Labs Lab 03/24/15 1030 03/27/15 0515 03/28/15 0700  LIPASE 142* 75* 38   CBC:  Recent Labs Lab 03/24/15 1030  03/26/15 0450  03/27/15 0515 03/28/15 0700 03/29/15 0555 03/30/15 0416  WBC 10.4  < > 7.7 7.3 10.5 12.0* 8.7  NEUTROABS 8.5*  --   --   --   --   --   --   HGB 11.9*  < > 10.6* 10.8* 10.7* 11.0* 10.8*  HCT 33.7*  < > 30.0* 31.1* 31.7* 32.6* 31.1*  MCV 86.2  < > 86.2 89.1 90.3 90.8 89.4  PLT 342  < > 291 292 318 371 344  < > = values in this interval not displayed. Cardiac Enzymes:  Recent Labs Lab 03/24/15 1810  CKTOTAL 82   Microbiology Recent Results (from the past 240 hour(s))  Clostridium Difficile by PCR     Status: None   Collection Time: 03/24/15  5:48 PM  Result Value Ref Range Status   C difficile by pcr NEGATIVE NEGATIVE Final  MRSA PCR Screening     Status: Abnormal  Collection Time: 03/24/15  5:48 PM  Result Value Ref Range Status   MRSA by PCR INVALID RESULTS, SPECIMEN SENT FOR CULTURE (A) NEGATIVE Final    Comment:        The GeneXpert MRSA Assay (FDA approved for NASAL specimens only), is one component of a comprehensive MRSA colonization surveillance program. It is not intended to diagnose MRSA infection nor to guide or monitor treatment for MRSA infections. MAY,B/2W @2110  ON 03/24/15 BY KARCZEWSKI,S.   MRSA culture     Status: None   Collection Time: 03/24/15  5:48 PM  Result Value Ref Range Status   Specimen Description NOSE  Final   Special Requests NONE  Final   Culture   Final    NO GROWTH 2 DAYS Note: No MRSA Isolated Performed at [facility]    Report Status 03/27/2015 FINAL  Final  Culture, blood (routine x 2)     Status: None (Preliminary result)   Collection Time: 03/24/15  6:00 PM  Result Value Ref Range Status   Specimen Description BLOOD R HAND  Final   Special Requests Normal  Final   Culture   Final           BLOOD CULTURE RECEIVED NO GROWTH TO DATE CULTURE WILL BE HELD FOR 5 DAYS BEFORE ISSUING A FINAL NEGATIVE REPORT Performed at [facility]    Report Status PENDING  Incomplete  Culture, blood (routine x 2)     Status:  None (Preliminary result)   Collection Time: 03/24/15  6:10 PM  Result Value Ref Range Status   Specimen Description BLOOD R ARM  Final   Special Requests Normal  Final   Culture   Final           BLOOD CULTURE RECEIVED NO GROWTH TO DATE CULTURE WILL BE HELD FOR 5 DAYS BEFORE ISSUING A FINAL NEGATIVE REPORT Performed at [facility]    Report Status PENDING  Incomplete     Medications:   . acidophilus  1 capsule Oral Daily  . amLODipine  5 mg Oral Daily  . antiseptic oral rinse  7 mL Mouth Rinse BID  . cycloSPORINE  1 drop Both Eyes BID  . donepezil  5 mg Oral QHS  . furosemide  80 mg Intravenous Q8H  . heparin  5,000 Units Subcutaneous 3 times per day  . metoprolol tartrate  25 mg Oral BID  . nystatin  5 mL Oral QID  . saccharomyces boulardii  250 mg Oral BID  . sertraline  50 mg Oral Daily  . sodium chloride  3 mL Intravenous Q12H   Continuous Infusions:   Time spent: 25 minutes.   LOS: 6 days   [facility] Pager [phone]. If unable to reach me by pager, please call my cell phone at [phone].  *Please refer to [email or web address], password [email or web address] to get updated schedule on who will round on this patient, as hospitalists switch teams weekly. If 7PM-7AM, please contact night-coverage at [email or web address], password [email or web address] for any overnight needs.  03/30/2015, 8:49 AM

## 2015-03-31 ENCOUNTER — Inpatient Hospital Stay (HOSPITAL_COMMUNITY): Payer: Medicare Other

## 2015-03-31 ENCOUNTER — Ambulatory Visit (HOSPITAL_COMMUNITY): Admission: RE | Admit: 2015-03-31 | Payer: Medicare Other | Source: Ambulatory Visit

## 2015-03-31 DIAGNOSIS — K5730 Diverticulosis of large intestine without perforation or abscess without bleeding: Secondary | ICD-10-CM

## 2015-03-31 DIAGNOSIS — I5031 Acute diastolic (congestive) heart failure: Secondary | ICD-10-CM | POA: Diagnosis present

## 2015-03-31 LAB — CULTURE, BLOOD (ROUTINE X 2)
CULTURE: NO GROWTH
Culture: NO GROWTH
Special Requests: NORMAL
Special Requests: NORMAL

## 2015-03-31 LAB — BASIC METABOLIC PANEL
ANION GAP: 13 (ref 5–15)
BUN: 41 mg/dL — ABNORMAL HIGH (ref 6–23)
CALCIUM: 8.4 mg/dL (ref 8.4–10.5)
CHLORIDE: 93 mmol/L — AB (ref 96–112)
CO2: 28 mmol/L (ref 19–32)
CREATININE: 5.61 mg/dL — AB (ref 0.50–1.10)
GFR calc non Af Amer: 6 mL/min — ABNORMAL LOW (ref >90)
GFR, EST AFRICAN AMERICAN: 8 mL/min — AB (ref >90)
Glucose, Bld: 116 mg/dL — ABNORMAL HIGH (ref 70–99)
Potassium: 3.2 mmol/L — ABNORMAL LOW (ref 3.5–5.1)
SODIUM: 134 mmol/L — AB (ref 135–145)

## 2015-03-31 MED ORDER — POTASSIUM CHLORIDE CRYS ER 20 MEQ PO TBCR
20.0000 meq | EXTENDED_RELEASE_TABLET | Freq: Two times a day (BID) | ORAL | Status: DC
  Administered 2015-03-31 – 2015-04-01 (×3): 20 meq via ORAL
  Filled 2015-03-31 (×3): qty 1

## 2015-03-31 NOTE — Progress Notes (Signed)
Progress Note   Toni [name] QMV:[id number] DOB: 11/27/1934 DOA: 03/24/2015 PCP: Unice Cobble, MD   Brief Narrative:   Toni [name] is an 81 y.o. female with a PMH of diverticulosis/recent treatment of diverticulitis with Cipro/Flagyl, HTN, HLD, was admitted 03/24/15 with chief complaint of weakness and ongoing diarrhea. In the ED, patient noted to be hemodynamically stable, blood work notable for acute renal failure with creatinine 7 and BUN 40.  Nephrologist and gastroenterologist were consulted.  Major events since admission: 4/18 - sudden onset of acute respiratory failure with hypoxia, chest x-ray consistent with pulmonary vascular congestion, IV fluids stopped, Lasix started. 4/19 - respiratory status more stable, still vascular congestion on exam, Lasix increased to 80 mg 3 times a day per nephrology team.  Assessment/Plan:   Principal Problem:   Diverticulitis / diverticulosis of large intestine - Status post evaluation by gastroenterology. - S/P 4 days of zosyn, which was discontinued 03/27/15 in the setting of 10 days of outpatient therapy with Cipro/Flagyl. - C. difficile PCR negative. Blood cultures negative to date. - Continue Imodium as needed.   Active Problems:  Acute hypoxic respiratory failure, on 28/41/3244 / acute diastolic CHF with pulmonary edema - Secondary to pulmonary vascular congestion from IV fluids, no evidence of clinical PNA.  - Lasix started and dose increased to 80 mg IV 3 times a day per nephrology team with good diuresis noted. - Creatinine continues trending down. - Monitor fluid status closely, daily weights, strict I/O.  I/O- 7 L/48 hours.  - Weight down to admission values after an 8 pound weight gain. - 2-D echo shows EF 60-65 percent with grade 1 diastolic dysfunction.   Acute renal failure - Likely secondary to ATN. - Urinalysis unremarkable, renal ultrasound unremarkable. - Cr continues trending down. - Nephrology  following.   Elevated lipase - Resolved.   Hypokalemia - Continue to supplement.   Hyponatremia - Resolved.    Metabolic acidosis - Treated with IV bicarbonate infusion. Bicarbonate remains WNL.   Normocytic anemia  - Status post anemia panel with iron level in 40's. - Hemoglobin stable with no indication for transfusion.   Essential hypertension - Continue when necessary hydralazine for better blood pressure control. - Continue Norvasc and Metoprolol.    Non severe protein calorie malnutrition/obesity - Body mass index is 31.33 kg/(m^2). - Continue to monitor nutritional status.    DVT prophylaxis  - Heparin SQ   Code Status: Full.  Family Communication: Daughter Toni [name])  and husband Toni [name]) at bedside.  Disposition Plan: Discharge home in 1-2 days if okay with nephrology.    IV Access:    Peripheral IV   Procedures and diagnostic studies:   Ct Abdomen Pelvis Wo Contrast 03/24/2015: Extensive sigmoid diverticulosis with findings compatible with superimposed diverticulitis. No abscess or perforation. Multiple hepatic and bilateral renal cyst. Some of these have increased in size but no aggressive features are identified. Aortoiliac atherosclerosis without aneurysm.   US Renal 03/24/2015: 2.2 cm calcified lesion in the right renal sinus, better visualized on CT, where it measured 2.7 cm. Lesion is unchanged in size since 2008 and therefore likely benign. If definitive characterization is desired, consider follow-up MRI with contrast when renal function improves. Additional simple bilateral renal cysts, as above. No hydronephrosis.   Dg Chest 2 View 03/28/2015:  Volume overload and CHF. Followup studies in the short-term are recommended to ensure resolution of these findings as there is prominence of the mediastinal and hilar soft tissues.  Dg Chest Port 1 View 03/29/2015: Pulmonary edema likely of cardiac cause more conspicuous today. Perihilar  alveolar opacities on the right and increased retrocardiac density on the left may reflect pneumonia   Medical Consultants:    Carol Ada, GI.  Donato Heinz, Neprhology  Anti-Infectives:    Zosyn 03/24/15---> 03/27/15  Subjective:   Toni [name] is still dyspneic with activity, but not at rest. Diarrhea has improved. No nausea or vomiting. Appetite fair. "I don't eat".  No abdominal pain.   Objective:    Filed Vitals:   03/30/15 2023 03/30/15 2121 03/31/15 0458 03/31/15 0500  BP: 160/70 160/70  140/68  Pulse: 73 73  66  Temp: 98.7 F (37.1 C)   98.6 F (37 C)  TempSrc: Oral   Oral  Resp: 20     Height:      Weight:   79.3 kg (174 lb 13.2 oz)   SpO2: 95%   96%    Intake/Output Summary (Last 24 hours) at 03/31/15 0738 Last data filed at 03/30/15 2023  Gross per 24 hour  Intake    600 ml  Output   2975 ml  Net  -2375 ml    Exam: Gen:  NAD Cardiovascular:  RRR, No M/R/G Respiratory:  Lungs CTAB, no rales Gastrointestinal:  Abdomen soft, NT/ND, + BS Extremities:  Trace edema   Data Reviewed:    Labs: Basic Metabolic Panel:  Recent Labs Lab 03/25/15 0329 03/26/15 0450 03/27/15 0515 03/28/15 0700 03/29/15 0555 03/30/15 0416 03/31/15 0348  NA 127*  126* 131* 134* 131* 135 136 134*  K 3.7  3.7 3.3* 3.6 3.9 4.1 3.3* 3.2*  CL 99  99 96 99 100 103 98 93*  CO2 17*  17* 25 25 21 21 25 28   GLUCOSE 114*  113* 93 107* 130* 113* 101* 116*  BUN 39*  39* 38* 37* 34* 34* 37* 41*  CREATININE 6.90*  6.84* 7.33* 7.11* 6.66* 6.39* 6.21* 5.61*  CALCIUM 7.4*  7.7* 6.9* 7.1* 7.2* 7.9* 8.1* 8.4  PHOS 6.4* 6.2*  --   --   --   --   --    GFR Estimated Creatinine Clearance: 8.5 mL/min (by C-G formula based on Cr of 5.61). Liver Function Tests:  Recent Labs Lab 03/24/15 1030 03/25/15 0329 03/26/15 0450  AST 20 21  --   ALT 18 15  --   ALKPHOS 77 80  --   BILITOT 0.7 0.7  --   PROT 6.5 6.4  --   ALBUMIN 3.5 3.4*  3.4* 2.7*    Recent  Labs Lab 03/24/15 1030 03/27/15 0515 03/28/15 0700  LIPASE 142* 75* 38   CBC:  Recent Labs Lab 03/24/15 1030  03/26/15 0450 03/27/15 0515 03/28/15 0700 03/29/15 0555 03/30/15 0416  WBC 10.4  < > 7.7 7.3 10.5 12.0* 8.7  NEUTROABS 8.5*  --   --   --   --   --   --   HGB 11.9*  < > 10.6* 10.8* 10.7* 11.0* 10.8*  HCT 33.7*  < > 30.0* 31.1* 31.7* 32.6* 31.1*  MCV 86.2  < > 86.2 89.1 90.3 90.8 89.4  PLT 342  < > 291 292 318 371 344  < > = values in this interval not displayed. Cardiac Enzymes:  Recent Labs Lab 03/24/15 1810  CKTOTAL 82   Microbiology Recent Results (from the past 240 hour(s))  Clostridium Difficile by PCR     Status: None   Collection Time: 03/24/15  5:48 PM  Result Value Ref Range Status   C difficile by pcr NEGATIVE NEGATIVE Final  MRSA PCR Screening     Status: Abnormal   Collection Time: 03/24/15  5:48 PM  Result Value Ref Range Status   MRSA by PCR INVALID RESULTS, SPECIMEN SENT FOR CULTURE (A) NEGATIVE Final    Comment:        The GeneXpert MRSA Assay (FDA approved for NASAL specimens only), is one component of a comprehensive MRSA colonization surveillance program. It is not intended to diagnose MRSA infection nor to guide or monitor treatment for MRSA infections. MAY,B/2W @2110  ON 03/24/15 BY KARCZEWSKI,S.   MRSA culture     Status: None   Collection Time: 03/24/15  5:48 PM  Result Value Ref Range Status   Specimen Description NOSE  Final   Special Requests NONE  Final   Culture   Final    NO GROWTH 2 DAYS Note: No MRSA Isolated Performed at [facility]    Report Status 03/27/2015 FINAL  Final  Culture, blood (routine x 2)     Status: None (Preliminary result)   Collection Time: 03/24/15  6:00 PM  Result Value Ref Range Status   Specimen Description BLOOD R HAND  Final   Special Requests Normal  Final   Culture   Final           BLOOD CULTURE RECEIVED NO GROWTH TO DATE CULTURE WILL BE HELD FOR 5 DAYS BEFORE ISSUING A  FINAL NEGATIVE REPORT Performed at [facility]    Report Status PENDING  Incomplete  Culture, blood (routine x 2)     Status: None (Preliminary result)   Collection Time: 03/24/15  6:10 PM  Result Value Ref Range Status   Specimen Description BLOOD R ARM  Final   Special Requests Normal  Final   Culture   Final           BLOOD CULTURE RECEIVED NO GROWTH TO DATE CULTURE WILL BE HELD FOR 5 DAYS BEFORE ISSUING A FINAL NEGATIVE REPORT Performed at [facility]    Report Status PENDING  Incomplete     Medications:   . acidophilus  1 capsule Oral Daily  . amLODipine  5 mg Oral Daily  . antiseptic oral rinse  7 mL Mouth Rinse BID  . cycloSPORINE  1 drop Both Eyes BID  . donepezil  5 mg Oral QHS  . heparin  5,000 Units Subcutaneous 3 times per day  . metoprolol tartrate  25 mg Oral BID  . nystatin  5 mL Oral QID  . saccharomyces boulardii  250 mg Oral BID  . sertraline  50 mg Oral Daily  . sodium chloride  3 mL Intravenous Q12H   Continuous Infusions:   Time spent: 25 minutes.   LOS: 7 days   [facility] Pager [phone]. If unable to reach me by pager, please call my cell phone at [phone].  *Please refer to [email or web address], password [email or web address] to get updated schedule on who will round on this patient, as hospitalists switch teams weekly. If 7PM-7AM, please contact night-coverage at [email or web address], password [email or web address] for any overnight needs.  03/31/2015, 7:38 AM

## 2015-03-31 NOTE — Progress Notes (Addendum)
[facility] Progress Note   Subjective: cont to diurese, creat down 5.6  Filed Vitals:   03/30/15 2023 03/30/15 2121 03/31/15 0458 03/31/15 0500  BP: 160/70 160/70  140/68  Pulse: 73 73  66  Temp: 98.7 F (37.1 C)   98.6 F (37 C)  TempSrc: Oral   Oral  Resp: 20     Height:      Weight:   79.3 kg (174 lb 13.2 oz)   SpO2: 95%   96%   Exam: Alert, frail, poor memory of events No jvd Chest clear bilat RRR no MRG Abd obese, NTND, +BS Trace LE edema Neuro is nf, Ox 3  UA 1.005, 5.5, 3-6 wbc, 0-2 rbc/hpf UNa 11, UCr 38 US kidneys 12.8/12.8 cm, no hydro, +cysts unchanged Creat 0.9 in Sept 2015    Assessment: 1. Acute renal failure - UA /US unremarkable. ATN due to vol depletion/ GI illness / ARB / NSAiD's.  Severe episode w no prior renal history, creat 0.9 in Sept 2015. Continues to improve. Have arranged f/u office appt for 1 month w Dr Justin Mend.  No ARB / ACEi for 2 months, in future hold ARB/ACEi's during any potentially dehydrating illness.  Will sign off, call as needed.  2. Acute/ chronic diverticulitis - s/p abx 3. Hypoxemia / early pulm edema - resolved clinically, due to vol excess in setting of AKI, s/p diuresis 4. Dementia on aricept 5. Depression on SSRI 6. HTN on norvasc/ MTP    Plan - as above    Kelly Splinter MD  pager [phone]    cell [phone]  03/31/2015, 8:21 AM     Recent Labs Lab 03/25/15 0329 03/26/15 0450  03/29/15 0555 03/30/15 0416 03/31/15 0348  NA 127*  126* 131*  < > 135 136 134*  K 3.7  3.7 3.3*  < > 4.1 3.3* 3.2*  CL 99  99 96  < > 103 98 93*  CO2 17*  17* 25  < > 21 25 28   GLUCOSE 114*  113* 93  < > 113* 101* 116*  BUN 39*  39* 38*  < > 34* 37* 41*  CREATININE 6.90*  6.84* 7.33*  < > 6.39* 6.21* 5.61*  CALCIUM 7.4*  7.7* 6.9*  < > 7.9* 8.1* 8.4  PHOS 6.4* 6.2*  --   --   --   --   < > = values in this interval not displayed.  Recent Labs Lab 03/24/15 1030 03/25/15 0329 03/26/15 0450  AST 20 21  --    ALT 18 15  --   ALKPHOS 77 80  --   BILITOT 0.7 0.7  --   PROT 6.5 6.4  --   ALBUMIN 3.5 3.4*  3.4* 2.7*    Recent Labs Lab 03/24/15 1030  03/28/15 0700 03/29/15 0555 03/30/15 0416  WBC 10.4  < > 10.5 12.0* 8.7  NEUTROABS 8.5*  --   --   --   --   HGB 11.9*  < > 10.7* 11.0* 10.8*  HCT 33.7*  < > 31.7* 32.6* 31.1*  MCV 86.2  < > 90.3 90.8 89.4  PLT 342  < > 318 371 344  < > = values in this interval not displayed. Marland Kitchen acidophilus  1 capsule Oral Daily  . amLODipine  5 mg Oral Daily  . antiseptic oral rinse  7 mL Mouth Rinse BID  . cycloSPORINE  1 drop Both Eyes BID  . donepezil  5 mg Oral QHS  .  heparin  5,000 Units Subcutaneous 3 times per day  . metoprolol tartrate  25 mg Oral BID  . nystatin  5 mL Oral QID  . potassium chloride  20 mEq Oral BID  . saccharomyces boulardii  250 mg Oral BID  . sertraline  50 mg Oral Daily  . sodium chloride  3 mL Intravenous Q12H     acetaminophen **OR** acetaminophen, alum & mag hydroxide-simeth, levalbuterol, loperamide, LORazepam, ondansetron **OR** ondansetron (ZOFRAN) IV, oxyCODONE

## 2015-03-31 NOTE — Progress Notes (Signed)
Pt ambulated in hall w/ standby assist. Oxygen sat 93-95% during and after ambulation. No SOB/dyspnea noted. Pt assisted back to chair. Will monitor.

## 2015-04-01 LAB — BASIC METABOLIC PANEL
Anion gap: 14 (ref 5–15)
BUN: 42 mg/dL — ABNORMAL HIGH (ref 6–23)
CALCIUM: 8.6 mg/dL (ref 8.4–10.5)
CO2: 26 mmol/L (ref 19–32)
CREATININE: 5.13 mg/dL — AB (ref 0.50–1.10)
Chloride: 92 mmol/L — ABNORMAL LOW (ref 96–112)
GFR calc Af Amer: 8 mL/min — ABNORMAL LOW (ref >90)
GFR calc non Af Amer: 7 mL/min — ABNORMAL LOW (ref >90)
GLUCOSE: 116 mg/dL — AB (ref 70–99)
Potassium: 3.5 mmol/L (ref 3.5–5.1)
Sodium: 132 mmol/L — ABNORMAL LOW (ref 135–145)

## 2015-04-01 MED ORDER — NYSTATIN 100000 UNIT/ML MT SUSP: 5.0000 mL | Freq: Four times a day (QID) | OROMUCOSAL | Status: DC

## 2015-04-01 MED ORDER — AMLODIPINE BESYLATE 5 MG PO TABS: 5.0000 mg | ORAL_TABLET | Freq: Every day | ORAL | Status: DC

## 2015-04-01 MED ORDER — LORAZEPAM 0.5 MG PO TABS: 0.5000 mg | ORAL_TABLET | Freq: Every evening | ORAL | Status: DC | PRN

## 2015-04-01 NOTE — Discharge Instructions (Signed)
Acute Kidney Injury Acute kidney injury is a disease in which there is sudden (acute) damage to the kidneys. The kidneys are 2 organs that lie on either side of the spine between the middle of the back and the front of the abdomen. The kidneys:  Remove wastes and extra water from the blood.   Produce important hormones. These help keep bones strong, regulate blood pressure, and help create red blood cells.   Balance the fluids and chemicals in the blood and tissues. A small amount of kidney damage may not cause problems, but a large amount of damage may make it difficult or impossible for the kidneys to work the way they should. Acute kidney injury may develop into long-lasting (chronic) kidney disease. It may also develop into a life-threatening disease called end-stage kidney disease. Acute kidney injury can get worse very quickly, so it should be treated right away. Early treatment may prevent other kidney diseases from developing.  CAUSES   A problem with blood flow to the kidneys. This may be caused by:   Blood loss.   Heart disease.   Severe burns.   Liver disease.  Direct damage to the kidneys. This may be caused by:  Some medicines.   A kidney infection.   Poisoning or consuming toxic substances.   A surgical wound.   A blow to the kidney area.   A problem with urine flow. This may be caused by:   Cancer.   Kidney stones.   An enlarged prostate. SYMPTOMS   Swelling (edema) of the legs, ankles, or feet.   Tiredness (lethargy).   Nausea or vomiting.   Confusion.   Problems with urination, such as:   Painful or burning feeling during urination.   Decreased urine production.   Frequent accidents in children who are potty trained.   Bloody urine.   Muscle twitches and cramps.   Shortness of breath.   Seizures.   Chest pain or pressure. Sometimes, no symptoms are present. DIAGNOSIS Acute kidney injury may be detected  and diagnosed by tests, including blood, urine, imaging, or kidney biopsy tests.  TREATMENT Treatment of acute kidney injury varies depending on the cause and severity of the kidney damage. In mild cases, no treatment may be needed. The kidneys may heal on their own. If acute kidney injury is more severe, your caregiver will treat the cause of the kidney damage, help the kidneys heal, and prevent complications from occurring. Severe cases may require a procedure to remove toxic wastes from the body (dialysis) or surgery to repair kidney damage. Surgery may involve:   Repair of a torn kidney.   Removal of an obstruction. Most of the time, you will need to stay overnight at the hospital.  HOME CARE INSTRUCTIONS:  Follow your prescribed diet.  Only take over-the-counter or prescription medicines as directed by your caregiver.  Do not take any new medicines (prescription, over-the-counter, or nutritional supplements) unless approved by your caregiver. Many medicines can worsen your kidney damage or need to have the dose adjusted.   Keep all follow-up appointments as directed by your caregiver.  Observe your condition to make sure you are healing as expected. SEEK IMMEDIATE MEDICAL CARE IF:  You are feeling ill or have severe pain in the back or side.   Your symptoms return or you have new symptoms.  You have any symptoms of end-stage kidney disease. These include:   Persistent itchiness.   Loss of appetite.   Headaches.   Abnormally dark   or light skin.  Numbness in the hands or feet.   Easy bruising.   Frequent hiccups.   Menstruation stops.   You have a fever.  You have increased urine production.  You have pain or bleeding when urinating. MAKE SURE YOU:   Understand these instructions.  Will watch your condition.  Will get help right away if you are not doing well or get worse Document Released: 06/11/2011 Document Revised: 03/23/2013 Document  Reviewed: 07/25/2012 ExitCare Patient Information 2015 [employer]. This information is not intended to replace advice given to you by your health care provider. Make sure you discuss any questions you have with your health care provider.  

## 2015-04-01 NOTE — Care Management Note (Addendum)
    Page 1 of 2   04/01/2015     1:10:45 PM CARE MANAGEMENT NOTE 04/01/2015  Patient:  Toni [name], Toni [name]   Account Number:  [id number]  Date Initiated:  03/25/2015  Documentation initiated by:  DAVIS,RHONDA  Subjective/Objective Assessment:   aki due to dehydration     Action/Plan:   home when stable   Anticipated DC Date:  04/01/2015   Anticipated DC Plan:  [place]  In-house referral  NA      DC Planning Services  CM consult      Choice offered to / List presented to:  C-1 Patient        [place] arranged  [facility]      [place].   Status of service:  Completed, signed off Medicare Important Message given?  YES (If response is "NO", the following Medicare IM given date fields will be blank) Date Medicare IM given:  03/30/2015 Medicare IM given by:  [facility] Date Additional Medicare IM given:   Additional Medicare IM given by:    Discharge Disposition:  [place]  Per UR Regulation:  Reviewed for med. necessity/level of care/duration of stay  If discussed at [place] of Stay Meetings, dates discussed:   03/29/2015    Comments:  04/01/15 Dessa Phi RN BSN NCM [phone] d/c home today.HHPT order,[id number] already placed.[facility] Kristen aware of HHPT order, & d/c.No 02 needed,not ordered.  03/30/15 Dessa Phi RN BSN NCM [phone] [place] chosen for HHPT.[facility] Kristen aware of HHPT order.Await d/c order.On 02 will monitor if qualfies can arrange.  March 26, 2015/Rhonda L. Rosana Hoes, RN, BSN, CCM. Case Management [place] [phone] No discharge needs present of time of review. patient with acute renal failure with SIADH.  March 25, 2015/Rhonda L. Rosana Hoes, RN, BSN, CCM. Case Management [place] [phone] No discharge needs present of time of review.

## 2015-04-01 NOTE — Discharge Summary (Signed)
Physician Discharge Summary  Toni [name] SNK:[id number] DOB: 1934-05-14 DOA: 03/24/2015  PCP: Unice Cobble, MD  Admit date: 03/24/2015 Discharge date: 04/01/2015   Recommendations for Outpatient Follow-Up:   1. Follow-up scheduled with Dr. Linna Darner 04/06/15. Please check renal function at that visit to ensure ongoing improvement in creatinine. The patient does have follow-up scheduled with Dr. Justin Mend of nephrology 04/29/15. 2. Home health physical therapy arranged.   Discharge Diagnosis:   Principal Problem:    Diverticulitis complicated by acute renal failure from acute tubular necrosis Active Problems:    Diverticulosis of large intestine    Essential hypertension    AKI (acute kidney injury)    Dehydration    Hyponatremia    Acute renal failure    Pulmonary edema    Acute diastolic CHF (congestive heart failure)   Discharge disposition:  Home.    Discharge Condition: Improved.  Diet recommendation: Low sodium, heart healthy.    Wound care: None.   History of Present Illness:   Toni [name] is an 81 y.o. female with a PMH of diverticulosis/recent treatment of diverticulitis with Cipro/Flagyl, HTN, HLD, was admitted 03/24/15 with chief complaint of weakness and ongoing diarrhea. In the ED, patient noted to be hemodynamically stable, blood work notable for acute renal failure with creatinine 7 and BUN 40. Nephrologist and gastroenterologist were consulted.  Major events since admission: 4/18 - sudden onset of acute respiratory failure with hypoxia, chest x-ray consistent with pulmonary vascular congestion, IV fluids stopped, Lasix started. 4/19 - respiratory status more stable, still vascular congestion on exam, Lasix increased to 80 mg 3 times a day per nephrology team.   Hospital Course by Problem:   Principal Problem:  Diverticulitis / diverticulosis of large intestine - Status post evaluation by gastroenterology. - S/P 4 days of zosyn,  which was discontinued 03/27/15 in the setting of 10 days of outpatient therapy with Cipro/Flagyl. - C. difficile PCR negative. Blood cultures negative to date. - Continue Imodium as needed.   Active Problems:  Acute hypoxic respiratory failure, on 93/79/0240 / acute diastolic CHF with pulmonary edema - Secondary to pulmonary vascular congestion from IV fluids, no evidence of clinical PNA.  - Status post diuresis back to admission weight. - 2-D echo showed EF 60-65 percent with grade 1 diastolic dysfunction.   Acute renal failure secondary to acute tubular necrosis - Urinalysis unremarkable, renal ultrasound unremarkable. - Cr continues trending down. - Nephrology follow the patient's clinical course and will follow-up with her post discharge.   Elevated lipase - Resolved.   Hypokalemia - Supplemented.   Hyponatremia - Resolved.    Metabolic acidosis - Treated with IV bicarbonate infusion. Bicarbonate remains WNL.   Normocytic anemia  - Status post anemia panel with iron level in 40's. - Hemoglobin stable with no indication for transfusion.   Essential hypertension - Continue when necessary hydralazine for better blood pressure control. - Continue Norvasc and Metoprolol.    Non severe protein calorie malnutrition/obesity - Body mass index is 31.33 kg/(m^2). - Continue to monitor nutritional status.    Medical Consultants:    Carol Ada, GI.  Donato Heinz, Neprhology   Discharge Exam:   Filed Vitals:   03/31/15 2142  BP: 158/67  Pulse: 65  Temp: 98 F (36.7 C)  Resp: 18   Filed Vitals:   03/31/15 0500 03/31/15 0934 03/31/15 1428 03/31/15 2142  BP: 140/68 138/61 137/74 158/67  Pulse: 66 67 74 65  Temp: 98.6 F (37 C)  98.4 F (  36.9 C) 98 F (36.7 C)  TempSrc: Oral  Oral Oral  Resp:   18 18  Height:      Weight:      SpO2: 96% 97% 93% 97%    Gen:  NAD Cardiovascular:  RRR, No M/R/G Respiratory: Lungs CTAB Gastrointestinal:  Abdomen soft, NT/ND with normal active bowel sounds. Extremities: No C/E/C   The results of significant diagnostics from this hospitalization (including imaging, microbiology, ancillary and laboratory) are listed below for reference.     Procedures and Diagnostic Studies:   Ct Abdomen Pelvis Wo Contrast 03/24/2015: Extensive sigmoid diverticulosis with findings compatible with superimposed diverticulitis. No abscess or perforation. Multiple hepatic and bilateral renal cyst. Some of these have increased in size but no aggressive features are identified. Aortoiliac atherosclerosis without aneurysm.   US Renal 03/24/2015: 2.2 cm calcified lesion in the right renal sinus, better visualized on CT, where it measured 2.7 cm. Lesion is unchanged in size since 2008 and therefore likely benign. If definitive characterization is desired, consider follow-up MRI with contrast when renal function improves. Additional simple bilateral renal cysts, as above. No hydronephrosis.   Dg Chest 2 View 03/28/2015: Volume overload and CHF. Followup studies in the short-term are recommended to ensure resolution of these findings as there is prominence of the mediastinal and hilar soft tissues.   Dg Chest Port 1 View 03/29/2015: Pulmonary edema likely of cardiac cause more conspicuous today. Perihilar alveolar opacities on the right and increased retrocardiac density on the left may reflect pneumonia.  2-D echo 03/30/15:  Study Conclusions  - Left ventricle: The cavity size was normal. Wall thickness was normal. Systolic function was normal. The estimated ejection fraction was in the range of 60% to 65%. Wall motion was normal; there were no regional wall motion abnormalities. Doppler parameters are consistent with abnormal left ventricular relaxation (grade 1 diastolic dysfunction). - Mitral valve: There was mild regurgitation. - Left atrium: The atrium was mildly dilated. - Pulmonary  arteries: PA peak pressure: 36 mm Hg (S).  Labs:   Basic Metabolic Panel:  Recent Labs Lab 03/26/15 0450  03/28/15 0700 03/29/15 0555 03/30/15 0416 03/31/15 0348 04/01/15 0355  NA 131*  < > 131* 135 136 134* 132*  K 3.3*  < > 3.9 4.1 3.3* 3.2* 3.5  CL 96  < > 100 103 98 93* 92*  CO2 25  < > 21 21 25 28 26   GLUCOSE 93  < > 130* 113* 101* 116* 116*  BUN 38*  < > 34* 34* 37* 41* 42*  CREATININE 7.33*  < > 6.66* 6.39* 6.21* 5.61* 5.13*  CALCIUM 6.9*  < > 7.2* 7.9* 8.1* 8.4 8.6  PHOS 6.2*  --   --   --   --   --   --   < > = values in this interval not displayed. GFR Estimated Creatinine Clearance: 9.3 mL/min (by C-G formula based on Cr of 5.13). Liver Function Tests:  Recent Labs Lab 03/26/15 0450  ALBUMIN 2.7*    Recent Labs Lab 03/27/15 0515 03/28/15 0700  LIPASE 75* 38   CBC:  Recent Labs Lab 03/26/15 0450 03/27/15 0515 03/28/15 0700 03/29/15 0555 03/30/15 0416  WBC 7.7 7.3 10.5 12.0* 8.7  HGB 10.6* 10.8* 10.7* 11.0* 10.8*  HCT 30.0* 31.1* 31.7* 32.6* 31.1*  MCV 86.2 89.1 90.3 90.8 89.4  PLT 291 292 318 371 344   Microbiology Recent Results (from the past 240 hour(s))  Clostridium Difficile by PCR  Status: None   Collection Time: 03/24/15  5:48 PM  Result Value Ref Range Status   C difficile by pcr NEGATIVE NEGATIVE Final  MRSA PCR Screening     Status: Abnormal   Collection Time: 03/24/15  5:48 PM  Result Value Ref Range Status   MRSA by PCR INVALID RESULTS, SPECIMEN SENT FOR CULTURE (A) NEGATIVE Final    Comment:        The GeneXpert MRSA Assay (FDA approved for NASAL specimens only), is one component of a comprehensive MRSA colonization surveillance program. It is not intended to diagnose MRSA infection nor to guide or monitor treatment for MRSA infections. MAY,B/2W @2110  ON 03/24/15 BY KARCZEWSKI,S.   MRSA culture     Status: None   Collection Time: 03/24/15  5:48 PM  Result Value Ref Range Status   Specimen Description NOSE  Final    Special Requests NONE  Final   Culture   Final    NO GROWTH 2 DAYS Note: No MRSA Isolated Performed at [facility]    Report Status 03/27/2015 FINAL  Final  Culture, blood (routine x 2)     Status: None   Collection Time: 03/24/15  6:00 PM  Result Value Ref Range Status   Specimen Description BLOOD R HAND  Final   Special Requests Normal  Final   Culture   Final    NO GROWTH 5 DAYS Performed at [facility]    Report Status 03/31/2015 FINAL  Final  Culture, blood (routine x 2)     Status: None   Collection Time: 03/24/15  6:10 PM  Result Value Ref Range Status   Specimen Description BLOOD R ARM  Final   Special Requests Normal  Final   Culture   Final    NO GROWTH 5 DAYS Performed at [facility]    Report Status 03/31/2015 FINAL  Final     Discharge Instructions:   Discharge Instructions    ([place]) Call MD:  Anytime you have any of the following symptoms: 1) 3 pound weight gain in 24 hours or 5 pounds in 1 week 2) shortness of breath, with or without a dry hacking cough 3) swelling in the hands, feet or stomach 4) if you have to sleep on extra pillows at night in order to breathe.    Complete by:  As directed      Call MD for:  extreme fatigue    Complete by:  As directed      Call MD for:  persistant nausea and vomiting    Complete by:  As directed      Call MD for:  severe uncontrolled pain    Complete by:  As directed      Call MD for:  temperature >100.4    Complete by:  As directed      Diet - low sodium heart healthy    Complete by:  As directed      Discharge instructions    Complete by:  As directed   You were cared for by Dr. Jacquelynn Cree  (a hospitalist) during your hospital stay. If you have any questions about your discharge medications or the care you received while you were in the hospital after you are discharged, you can call the unit and ask to speak with the hospitalist on call if the hospitalist that took  care of you is not available. Once you are discharged, your primary care physician will handle any further  medical issues. Please note that NO REFILLS for any discharge medications will be authorized once you are discharged, as it is imperative that you return to your primary care physician (or establish a relationship with a primary care physician if you do not have one) for your aftercare needs so that they can reassess your need for medications and monitor your lab values.  Any outstanding tests can be reviewed by your PCP at your follow up visit.  It is also important to review any medicine changes with your PCP.  Please bring these d/c instructions with you to your next visit so your physician can review these changes with you.     Face-to-face encounter (required for Medicare/Medicaid patients)    Complete by:  As directed   I Adely Facer certify that this patient is under my care and that I, or a nurse practitioner or physician's assistant working with me, had a face-to-face encounter that meets the physician face-to-face encounter requirements with this patient on 04/01/2015. The encounter with the patient was in whole, or in part for the following medical condition(s) which is the primary reason for home health care (List medical condition): Deconditioning after a 8 day hospital stay for ARF, diverticulitis.  The encounter with the patient was in whole, or in part, for the following medical condition, which is the primary reason for home health care:  Deconditioning post d/c for ARF  I certify that, based on my findings, the following services are medically necessary home health services:  Physical therapy  Reason for Medically Necessary Home Health Services:  Therapy- Therapeutic Exercises to Increase Strength and Endurance  My clinical findings support the need for the above services:  Unable to leave home safely without assistance and/or assistive device  Further, I certify that my clinical  findings support that this patient is homebound due to:  Unable to leave home safely without assistance     Home Health    Complete by:  As directed   To provide the following care/treatments:  PT     Increase activity slowly    Complete by:  As directed             Medication List    STOP taking these medications        ciprofloxacin 500 MG tablet  Commonly known as:  CIPRO     hydrochlorothiazide 25 MG tablet  Commonly known as:  HYDRODIURIL     losartan 100 MG tablet  Commonly known as:  COZAAR     magnesium 30 MG tablet     metroNIDAZOLE 250 MG tablet  Commonly known as:  FLAGYL     tiZANidine 2 MG tablet  Commonly known as:  ZANAFLEX     traMADol 50 MG tablet  Commonly known as:  ULTRAM      TAKE these medications        amLODipine 5 MG tablet  Commonly known as:  NORVASC  Take 1 tablet (5 mg total) by mouth daily.     aspirin 81 MG tablet  Take 81 mg by mouth daily.     cholestyramine 4 GM/DOSE powder  Commonly known as:  QUESTRAN  Take 0.5 packets (2 g total) by mouth daily.     Coenzyme Q10 300 MG Caps  Take 1 capsule by mouth daily.     cycloSPORINE 0.05 % ophthalmic emulsion  Commonly known as:  RESTASIS  Place 1 drop into both eyes 2 (two) times daily.     docusate  sodium 100 MG capsule  Commonly known as:  COLACE  Take 100 mg by mouth 2 (two) times daily.     donepezil 5 MG tablet  Commonly known as:  ARICEPT  Take 1 tablet (5 mg total) by mouth at bedtime.     hydrALAZINE 25 MG tablet  Commonly known as:  APRESOLINE  TAKE 1 TABLET (25 MG TOTAL) BY MOUTH 3 (THREE) TIMES DAILY.     LORazepam 0.5 MG tablet  Commonly known as:  ATIVAN  Take 1 tablet (0.5 mg total) by mouth at bedtime as needed for anxiety or sleep.     metoprolol tartrate 25 MG tablet  Commonly known as:  LOPRESSOR  Take 1 tablet (25 mg total) by mouth 2 (two) times daily.     nystatin 100000 UNIT/ML suspension  Commonly known as:  MYCOSTATIN  Take 5 mLs (500,000  Units total) by mouth 4 (four) times daily.     omeprazole 20 MG capsule  Commonly known as:  PRILOSEC  Take 1 capsule by mouth daily as needed (heartburn).     RESTORA Caps  Take 1 capsule by mouth daily.     sertraline 50 MG tablet  Commonly known as:  ZOLOFT  Take 1 tablet (50 mg total) by mouth daily.           Follow-up Information    Follow up with [facility].   Why:  HHPT   Contact information:   [place] [address] [phone]       Follow up with Sherril Croon, MD On 04/29/2015.   Specialty:  Nephrology   Why:  Kidney doctor F/U wtih Dr Justin Mend on Friday May 20th check-in at office 10:45 am   Contact information:   [place] [address] [phone]       Follow up with Unice Cobble, MD. Go on 04/06/2015.   Specialty:  Internal Medicine   Why:  Appointment time is 3:00. F/u to re-check creatinine and acute renal failure.   Contact information:   [address] [place] [address] [phone]        Time coordinating discharge: 35 minutes.  Signed:  Evoleth Nordmeyer  Pager [phone] [facility] 04/01/2015, 2:53 PM

## 2015-04-04 ENCOUNTER — Inpatient Hospital Stay (HOSPITAL_COMMUNITY)
Admission: EM | Admit: 2015-04-04 | Discharge: 2015-04-10 | DRG: 871 | Disposition: A | Discharge status: Home / Self Care | Payer: Medicare Other | Attending: Internal Medicine | Admitting: Internal Medicine

## 2015-04-04 ENCOUNTER — Encounter (HOSPITAL_COMMUNITY): Payer: Self-pay | Admitting: Emergency Medicine

## 2015-04-04 DIAGNOSIS — Z86010 Personal history of colonic polyps: Secondary | ICD-10-CM

## 2015-04-04 DIAGNOSIS — I5030 Unspecified diastolic (congestive) heart failure: Secondary | ICD-10-CM | POA: Diagnosis present

## 2015-04-04 DIAGNOSIS — E861 Hypovolemia: Secondary | ICD-10-CM | POA: Diagnosis present

## 2015-04-04 DIAGNOSIS — R197 Diarrhea, unspecified: Secondary | ICD-10-CM

## 2015-04-04 DIAGNOSIS — I10 Essential (primary) hypertension: Secondary | ICD-10-CM | POA: Diagnosis not present

## 2015-04-04 DIAGNOSIS — R112 Nausea with vomiting, unspecified: Secondary | ICD-10-CM | POA: Diagnosis not present

## 2015-04-04 DIAGNOSIS — A419 Sepsis, unspecified organism: Principal | ICD-10-CM

## 2015-04-04 DIAGNOSIS — Z9049 Acquired absence of other specified parts of digestive tract: Secondary | ICD-10-CM | POA: Diagnosis present

## 2015-04-04 DIAGNOSIS — Z8619 Personal history of other infectious and parasitic diseases: Secondary | ICD-10-CM | POA: Diagnosis present

## 2015-04-04 DIAGNOSIS — A047 Enterocolitis due to Clostridium difficile: Secondary | ICD-10-CM | POA: Diagnosis not present

## 2015-04-04 DIAGNOSIS — I5031 Acute diastolic (congestive) heart failure: Secondary | ICD-10-CM | POA: Diagnosis present

## 2015-04-04 DIAGNOSIS — K7689 Other specified diseases of liver: Secondary | ICD-10-CM | POA: Diagnosis not present

## 2015-04-04 DIAGNOSIS — E785 Hyperlipidemia, unspecified: Secondary | ICD-10-CM | POA: Diagnosis present

## 2015-04-04 DIAGNOSIS — Z87891 Personal history of nicotine dependence: Secondary | ICD-10-CM

## 2015-04-04 DIAGNOSIS — E871 Hypo-osmolality and hyponatremia: Secondary | ICD-10-CM | POA: Diagnosis not present

## 2015-04-04 DIAGNOSIS — E869 Volume depletion, unspecified: Secondary | ICD-10-CM | POA: Diagnosis present

## 2015-04-04 DIAGNOSIS — Z806 Family history of leukemia: Secondary | ICD-10-CM

## 2015-04-04 DIAGNOSIS — N2889 Other specified disorders of kidney and ureter: Secondary | ICD-10-CM | POA: Diagnosis not present

## 2015-04-04 DIAGNOSIS — K5732 Diverticulitis of large intestine without perforation or abscess without bleeding: Secondary | ICD-10-CM | POA: Diagnosis not present

## 2015-04-04 DIAGNOSIS — E860 Dehydration: Secondary | ICD-10-CM | POA: Diagnosis present

## 2015-04-04 DIAGNOSIS — Z803 Family history of malignant neoplasm of breast: Secondary | ICD-10-CM

## 2015-04-04 DIAGNOSIS — R509 Fever, unspecified: Secondary | ICD-10-CM

## 2015-04-04 DIAGNOSIS — Z8711 Personal history of peptic ulcer disease: Secondary | ICD-10-CM

## 2015-04-04 DIAGNOSIS — E876 Hypokalemia: Secondary | ICD-10-CM | POA: Diagnosis present

## 2015-04-04 DIAGNOSIS — Z800 Family history of malignant neoplasm of digestive organs: Secondary | ICD-10-CM

## 2015-04-04 DIAGNOSIS — Z8249 Family history of ischemic heart disease and other diseases of the circulatory system: Secondary | ICD-10-CM

## 2015-04-04 DIAGNOSIS — N170 Acute kidney failure with tubular necrosis: Secondary | ICD-10-CM | POA: Diagnosis not present

## 2015-04-04 DIAGNOSIS — N179 Acute kidney failure, unspecified: Secondary | ICD-10-CM | POA: Diagnosis present

## 2015-04-04 NOTE — ED Notes (Signed)
Pt is here tonight with c/o nausea, vomiting, and diarrhea  Pt states she was just discharged from the 4th floor on Friday for same  Pt family states pt has not been eating since she has been home  Husband states pt started with diarrhea on Sunday and the vomiting today  Pt is c/o headache

## 2015-04-05 ENCOUNTER — Emergency Department (HOSPITAL_COMMUNITY): Payer: Medicare Other

## 2015-04-05 ENCOUNTER — Encounter (HOSPITAL_COMMUNITY): Payer: Self-pay

## 2015-04-05 DIAGNOSIS — K7689 Other specified diseases of liver: Secondary | ICD-10-CM | POA: Diagnosis not present

## 2015-04-05 DIAGNOSIS — E785 Hyperlipidemia, unspecified: Secondary | ICD-10-CM | POA: Diagnosis not present

## 2015-04-05 DIAGNOSIS — I5031 Acute diastolic (congestive) heart failure: Secondary | ICD-10-CM | POA: Diagnosis present

## 2015-04-05 DIAGNOSIS — Z803 Family history of malignant neoplasm of breast: Secondary | ICD-10-CM | POA: Diagnosis not present

## 2015-04-05 DIAGNOSIS — A419 Sepsis, unspecified organism: Secondary | ICD-10-CM | POA: Diagnosis not present

## 2015-04-05 DIAGNOSIS — R509 Fever, unspecified: Secondary | ICD-10-CM | POA: Diagnosis not present

## 2015-04-05 DIAGNOSIS — R112 Nausea with vomiting, unspecified: Secondary | ICD-10-CM

## 2015-04-05 DIAGNOSIS — A047 Enterocolitis due to Clostridium difficile: Secondary | ICD-10-CM | POA: Diagnosis not present

## 2015-04-05 DIAGNOSIS — I10 Essential (primary) hypertension: Secondary | ICD-10-CM | POA: Diagnosis not present

## 2015-04-05 DIAGNOSIS — I5032 Chronic diastolic (congestive) heart failure: Secondary | ICD-10-CM

## 2015-04-05 DIAGNOSIS — Z800 Family history of malignant neoplasm of digestive organs: Secondary | ICD-10-CM | POA: Diagnosis not present

## 2015-04-05 DIAGNOSIS — N2889 Other specified disorders of kidney and ureter: Secondary | ICD-10-CM | POA: Diagnosis not present

## 2015-04-05 DIAGNOSIS — E861 Hypovolemia: Secondary | ICD-10-CM | POA: Diagnosis present

## 2015-04-05 DIAGNOSIS — Z806 Family history of leukemia: Secondary | ICD-10-CM | POA: Diagnosis not present

## 2015-04-05 DIAGNOSIS — E876 Hypokalemia: Secondary | ICD-10-CM | POA: Diagnosis present

## 2015-04-05 DIAGNOSIS — E860 Dehydration: Secondary | ICD-10-CM | POA: Diagnosis present

## 2015-04-05 DIAGNOSIS — E871 Hypo-osmolality and hyponatremia: Secondary | ICD-10-CM | POA: Diagnosis present

## 2015-04-05 DIAGNOSIS — Z8249 Family history of ischemic heart disease and other diseases of the circulatory system: Secondary | ICD-10-CM | POA: Diagnosis not present

## 2015-04-05 DIAGNOSIS — R197 Diarrhea, unspecified: Secondary | ICD-10-CM

## 2015-04-05 DIAGNOSIS — Z8711 Personal history of peptic ulcer disease: Secondary | ICD-10-CM | POA: Diagnosis not present

## 2015-04-05 DIAGNOSIS — Z8619 Personal history of other infectious and parasitic diseases: Secondary | ICD-10-CM | POA: Diagnosis present

## 2015-04-05 DIAGNOSIS — E869 Volume depletion, unspecified: Secondary | ICD-10-CM | POA: Diagnosis present

## 2015-04-05 DIAGNOSIS — N170 Acute kidney failure with tubular necrosis: Secondary | ICD-10-CM | POA: Diagnosis not present

## 2015-04-05 DIAGNOSIS — IMO0001 Reserved for inherently not codable concepts without codable children: Secondary | ICD-10-CM | POA: Insufficient documentation

## 2015-04-05 DIAGNOSIS — Z87891 Personal history of nicotine dependence: Secondary | ICD-10-CM | POA: Diagnosis not present

## 2015-04-05 DIAGNOSIS — Z9049 Acquired absence of other specified parts of digestive tract: Secondary | ICD-10-CM | POA: Diagnosis present

## 2015-04-05 DIAGNOSIS — Z86010 Personal history of colonic polyps: Secondary | ICD-10-CM | POA: Diagnosis not present

## 2015-04-05 DIAGNOSIS — I5030 Unspecified diastolic (congestive) heart failure: Secondary | ICD-10-CM | POA: Diagnosis present

## 2015-04-05 LAB — CBC WITH DIFFERENTIAL/PLATELET
BASOS ABS: 0 10*3/uL (ref 0.0–0.1)
BASOS ABS: 0 10*3/uL (ref 0.0–0.1)
BASOS PCT: 0 % (ref 0–1)
Basophils Relative: 0 % (ref 0–1)
EOS ABS: 0.1 10*3/uL (ref 0.0–0.7)
EOS ABS: 0 10*3/uL (ref 0.0–0.7)
EOS PCT: 0 % (ref 0–5)
Eosinophils Relative: 0 % (ref 0–5)
HCT: 36 % (ref 36.0–46.0)
HCT: 31.6 % — ABNORMAL LOW (ref 36.0–46.0)
HEMOGLOBIN: 12.4 g/dL (ref 12.0–15.0)
Hemoglobin: 10.7 g/dL — ABNORMAL LOW (ref 12.0–15.0)
LYMPHS ABS: 0.7 10*3/uL (ref 0.7–4.0)
Lymphocytes Relative: 2 % — ABNORMAL LOW (ref 12–46)
Lymphocytes Relative: 4 % — ABNORMAL LOW (ref 12–46)
Lymphs Abs: 0.5 10*3/uL — ABNORMAL LOW (ref 0.7–4.0)
MCH: 30.6 pg (ref 26.0–34.0)
MCH: 30.1 pg (ref 26.0–34.0)
MCHC: 34.4 g/dL (ref 30.0–36.0)
MCHC: 33.9 g/dL (ref 30.0–36.0)
MCV: 88.9 fL (ref 78.0–100.0)
MCV: 88.8 fL (ref 78.0–100.0)
MONOS PCT: 7 % (ref 3–12)
Monocytes Absolute: 1.5 10*3/uL — ABNORMAL HIGH (ref 0.1–1.0)
Monocytes Absolute: 0.8 10*3/uL (ref 0.1–1.0)
Monocytes Relative: 5 % (ref 3–12)
Neutro Abs: 18.8 10*3/uL — ABNORMAL HIGH (ref 1.7–7.7)
Neutro Abs: 15.8 10*3/uL — ABNORMAL HIGH (ref 1.7–7.7)
Neutrophils Relative %: 91 % — ABNORMAL HIGH (ref 43–77)
Neutrophils Relative %: 91 % — ABNORMAL HIGH (ref 43–77)
PLATELETS: 367 10*3/uL (ref 150–400)
PLATELETS: 334 10*3/uL (ref 150–400)
RBC: 4.05 MIL/uL (ref 3.87–5.11)
RBC: 3.56 MIL/uL — AB (ref 3.87–5.11)
RDW: 13.2 % (ref 11.5–15.5)
RDW: 13.3 % (ref 11.5–15.5)
WBC: 20.8 10*3/uL — AB (ref 4.0–10.5)
WBC: 17.3 10*3/uL — ABNORMAL HIGH (ref 4.0–10.5)

## 2015-04-05 LAB — URINALYSIS, ROUTINE W REFLEX MICROSCOPIC
Bilirubin Urine: NEGATIVE
Glucose, UA: NEGATIVE mg/dL
Hgb urine dipstick: NEGATIVE
Ketones, ur: NEGATIVE mg/dL
NITRITE: NEGATIVE
PH: 5.5 (ref 5.0–8.0)
Protein, ur: NEGATIVE mg/dL
Specific Gravity, Urine: 1.015 (ref 1.005–1.030)
UROBILINOGEN UA: 0.2 mg/dL (ref 0.0–1.0)

## 2015-04-05 LAB — COMPREHENSIVE METABOLIC PANEL
ALK PHOS: 86 U/L (ref 39–117)
ALT: 16 U/L (ref 0–35)
ALT: 13 U/L (ref 0–35)
AST: 16 U/L (ref 0–37)
AST: 17 U/L (ref 0–37)
Albumin: 3.9 g/dL (ref 3.5–5.2)
Albumin: 3.2 g/dL — ABNORMAL LOW (ref 3.5–5.2)
Alkaline Phosphatase: 74 U/L (ref 39–117)
Anion gap: 11 (ref 5–15)
Anion gap: 13 (ref 5–15)
BILIRUBIN TOTAL: 1.1 mg/dL (ref 0.3–1.2)
BUN: 43 mg/dL — ABNORMAL HIGH (ref 6–23)
BUN: 35 mg/dL — ABNORMAL HIGH (ref 6–23)
CALCIUM: 8.9 mg/dL (ref 8.4–10.5)
CALCIUM: 8.1 mg/dL — AB (ref 8.4–10.5)
CHLORIDE: 97 mmol/L (ref 96–112)
CHLORIDE: 99 mmol/L (ref 96–112)
CO2: 20 mmol/L (ref 19–32)
CO2: 18 mmol/L — ABNORMAL LOW (ref 19–32)
CREATININE: 3.38 mg/dL — AB (ref 0.50–1.10)
CREATININE: 3.13 mg/dL — AB (ref 0.50–1.10)
GFR calc Af Amer: 14 mL/min — ABNORMAL LOW (ref >90)
GFR calc Af Amer: 15 mL/min — ABNORMAL LOW (ref >90)
GFR calc non Af Amer: 12 mL/min — ABNORMAL LOW (ref >90)
GFR calc non Af Amer: 13 mL/min — ABNORMAL LOW (ref >90)
GLUCOSE: 173 mg/dL — AB (ref 70–99)
Glucose, Bld: 144 mg/dL — ABNORMAL HIGH (ref 70–99)
Potassium: 3 mmol/L — ABNORMAL LOW (ref 3.5–5.1)
Potassium: 3 mmol/L — ABNORMAL LOW (ref 3.5–5.1)
SODIUM: 128 mmol/L — AB (ref 135–145)
Sodium: 130 mmol/L — ABNORMAL LOW (ref 135–145)
Total Bilirubin: 0.9 mg/dL (ref 0.3–1.2)
Total Protein: 7.6 g/dL (ref 6.0–8.3)
Total Protein: 6.3 g/dL (ref 6.0–8.3)

## 2015-04-05 LAB — PROTIME-INR
INR: 1.2 (ref 0.00–1.49)
Prothrombin Time: 15.4 seconds — ABNORMAL HIGH (ref 11.6–15.2)

## 2015-04-05 LAB — GLUCOSE, CAPILLARY
Glucose-Capillary: 120 mg/dL — ABNORMAL HIGH (ref 70–99)
Glucose-Capillary: 133 mg/dL — ABNORMAL HIGH (ref 70–99)
Glucose-Capillary: 150 mg/dL — ABNORMAL HIGH (ref 70–99)

## 2015-04-05 LAB — APTT: aPTT: 31 seconds (ref 24–37)

## 2015-04-05 LAB — URINE MICROSCOPIC-ADD ON

## 2015-04-05 LAB — LACTIC ACID, PLASMA
Lactic Acid, Venous: 2.1 mmol/L (ref 0.5–2.0)
Lactic Acid, Venous: 1.3 mmol/L (ref 0.5–2.0)

## 2015-04-05 LAB — TROPONIN I: TROPONIN I: 0.04 ng/mL — AB (ref <0.031)

## 2015-04-05 LAB — I-STAT CG4 LACTIC ACID, ED
LACTIC ACID, VENOUS: 4.21 mmol/L — AB (ref 0.5–2.0)
Lactic Acid, Venous: 1.41 mmol/L (ref 0.5–2.0)

## 2015-04-05 LAB — PROCALCITONIN: Procalcitonin: 0.53 ng/mL

## 2015-04-05 LAB — CLOSTRIDIUM DIFFICILE BY PCR: Toxigenic C. Difficile by PCR: POSITIVE — AB

## 2015-04-05 LAB — LIPASE, BLOOD: Lipase: 30 U/L (ref 11–59)

## 2015-04-05 MED ORDER — LORAZEPAM 0.5 MG PO TABS
0.5000 mg | ORAL_TABLET | Freq: Every evening | ORAL | Status: DC | PRN
Start: 2015-04-05 — End: 2015-04-10
  Administered 2015-04-05 – 2015-04-09 (×5): 0.5 mg via ORAL
  Filled 2015-04-05 (×5): qty 1

## 2015-04-05 MED ORDER — VANCOMYCIN HCL IN DEXTROSE 1-5 GM/200ML-% IV SOLN: 1000.0000 mg | INTRAVENOUS | Status: DC

## 2015-04-05 MED ORDER — ACETAMINOPHEN 650 MG RE SUPP: 650.0000 mg | Freq: Four times a day (QID) | RECTAL | Status: DC | PRN

## 2015-04-05 MED ORDER — SODIUM CHLORIDE 0.9 % IV BOLUS (SEPSIS)
1000.0000 mL | Freq: Once | INTRAVENOUS | Status: AC
  Administered 2015-04-05: 1000 mL via INTRAVENOUS

## 2015-04-05 MED ORDER — METOPROLOL TARTRATE 1 MG/ML IV SOLN
2.5000 mg | Freq: Three times a day (TID) | INTRAVENOUS | Status: DC
  Administered 2015-04-05 (×2): 2.5 mg via INTRAVENOUS
  Filled 2015-04-05 (×2): qty 5

## 2015-04-05 MED ORDER — SERTRALINE HCL 50 MG PO TABS
50.0000 mg | ORAL_TABLET | Freq: Every day | ORAL | Status: DC
  Administered 2015-04-05 – 2015-04-10 (×6): 50 mg via ORAL
  Filled 2015-04-05 (×6): qty 1

## 2015-04-05 MED ORDER — PIPERACILLIN-TAZOBACTAM IN DEX 2-0.25 GM/50ML IV SOLN
2.2500 g | INTRAVENOUS | Status: AC
  Administered 2015-04-05: 2.25 g via INTRAVENOUS
  Filled 2015-04-05: qty 50

## 2015-04-05 MED ORDER — PIPERACILLIN-TAZOBACTAM IN DEX 2-0.25 GM/50ML IV SOLN
2.2500 g | Freq: Three times a day (TID) | INTRAVENOUS | Status: DC
  Filled 2015-04-05: qty 50

## 2015-04-05 MED ORDER — ONDANSETRON HCL 4 MG PO TABS: 4.0000 mg | ORAL_TABLET | Freq: Four times a day (QID) | ORAL | Status: DC | PRN

## 2015-04-05 MED ORDER — ASPIRIN EC 81 MG PO TBEC
81.0000 mg | DELAYED_RELEASE_TABLET | Freq: Every day | ORAL | Status: DC
  Administered 2015-04-05 – 2015-04-10 (×6): 81 mg via ORAL
  Filled 2015-04-05 (×7): qty 1

## 2015-04-05 MED ORDER — HYDRALAZINE HCL 20 MG/ML IJ SOLN: 5.0000 mg | INTRAMUSCULAR | Status: DC | PRN

## 2015-04-05 MED ORDER — METOCLOPRAMIDE HCL 5 MG/ML IJ SOLN
5.0000 mg | INTRAMUSCULAR | Status: AC
  Administered 2015-04-05: 5 mg via INTRAVENOUS
  Filled 2015-04-05: qty 2

## 2015-04-05 MED ORDER — VANCOMYCIN HCL IN DEXTROSE 1-5 GM/200ML-% IV SOLN
1000.0000 mg | INTRAVENOUS | Status: AC
  Administered 2015-04-05: 1000 mg via INTRAVENOUS
  Filled 2015-04-05: qty 200

## 2015-04-05 MED ORDER — SODIUM CHLORIDE 0.9 % IV SOLN
INTRAVENOUS | Status: DC
  Administered 2015-04-05: 08:00:00 via INTRAVENOUS

## 2015-04-05 MED ORDER — CYCLOSPORINE 0.05 % OP EMUL
1.0000 [drp] | Freq: Two times a day (BID) | OPHTHALMIC | Status: DC
  Administered 2015-04-05 – 2015-04-10 (×8): 1 [drp] via OPHTHALMIC
  Filled 2015-04-05 (×12): qty 1

## 2015-04-05 MED ORDER — ONDANSETRON HCL 4 MG/2ML IJ SOLN
4.0000 mg | Freq: Four times a day (QID) | INTRAMUSCULAR | Status: DC | PRN
  Administered 2015-04-05 (×2): 4 mg via INTRAVENOUS
  Filled 2015-04-05 (×2): qty 2

## 2015-04-05 MED ORDER — ACETAMINOPHEN 650 MG RE SUPP
650.0000 mg | Freq: Once | RECTAL | Status: AC
  Administered 2015-04-05: 650 mg via RECTAL

## 2015-04-05 MED ORDER — PIPERACILLIN-TAZOBACTAM IN DEX 2-0.25 GM/50ML IV SOLN
2.2500 g | Freq: Three times a day (TID) | INTRAVENOUS | Status: DC
  Administered 2015-04-05: 2.25 g via INTRAVENOUS
  Filled 2015-04-05: qty 50

## 2015-04-05 MED ORDER — IOHEXOL 300 MG/ML  SOLN
50.0000 mL | Freq: Once | INTRAMUSCULAR | Status: AC | PRN
  Administered 2015-04-05: 50 mL via ORAL

## 2015-04-05 MED ORDER — ENOXAPARIN SODIUM 30 MG/0.3ML ~~LOC~~ SOLN
30.0000 mg | SUBCUTANEOUS | Status: DC
  Administered 2015-04-05 – 2015-04-07 (×3): 30 mg via SUBCUTANEOUS
  Filled 2015-04-05 (×3): qty 0.3

## 2015-04-05 MED ORDER — ACETAMINOPHEN 650 MG RE SUPP
650.0000 mg | Freq: Once | RECTAL | Status: AC
  Administered 2015-04-05: 650 mg via RECTAL
  Filled 2015-04-05: qty 1

## 2015-04-05 MED ORDER — HYDRALAZINE HCL 25 MG PO TABS
25.0000 mg | ORAL_TABLET | Freq: Three times a day (TID) | ORAL | Status: DC
  Administered 2015-04-05 – 2015-04-10 (×14): 25 mg via ORAL
  Filled 2015-04-05 (×14): qty 1

## 2015-04-05 MED ORDER — ACETAMINOPHEN 325 MG PO TABS: 650.0000 mg | ORAL_TABLET | Freq: Four times a day (QID) | ORAL | Status: DC | PRN

## 2015-04-05 MED ORDER — VITAMIN B-12 1000 MCG PO TABS
500.0000 ug | ORAL_TABLET | Freq: Every day | ORAL | Status: DC
  Administered 2015-04-05 – 2015-04-10 (×6): 500 ug via ORAL
  Filled 2015-04-05 (×6): qty 1

## 2015-04-05 MED ORDER — DONEPEZIL HCL 5 MG PO TABS
5.0000 mg | ORAL_TABLET | Freq: Every day | ORAL | Status: DC
  Administered 2015-04-05 – 2015-04-09 (×5): 5 mg via ORAL
  Filled 2015-04-05 (×5): qty 1

## 2015-04-05 MED ORDER — VANCOMYCIN 50 MG/ML ORAL SOLUTION
125.0000 mg | Freq: Four times a day (QID) | ORAL | Status: DC
  Administered 2015-04-05 – 2015-04-10 (×20): 125 mg via ORAL
  Filled 2015-04-05 (×30): qty 2.5

## 2015-04-05 MED ORDER — METOPROLOL TARTRATE 25 MG PO TABS
25.0000 mg | ORAL_TABLET | Freq: Two times a day (BID) | ORAL | Status: DC
  Administered 2015-04-05 – 2015-04-10 (×10): 25 mg via ORAL
  Filled 2015-04-05 (×10): qty 1

## 2015-04-05 MED ORDER — POTASSIUM CHLORIDE IN NACL 40-0.9 MEQ/L-% IV SOLN
INTRAVENOUS | Status: AC
  Administered 2015-04-05 – 2015-04-06 (×2): 75 mL/h via INTRAVENOUS
  Filled 2015-04-05 (×2): qty 1000

## 2015-04-05 MED ORDER — AMLODIPINE BESYLATE 5 MG PO TABS
5.0000 mg | ORAL_TABLET | Freq: Every day | ORAL | Status: DC
  Administered 2015-04-05 – 2015-04-10 (×6): 5 mg via ORAL
  Filled 2015-04-05 (×6): qty 1

## 2015-04-05 MED ORDER — METRONIDAZOLE IN NACL 5-0.79 MG/ML-% IV SOLN
500.0000 mg | Freq: Three times a day (TID) | INTRAVENOUS | Status: DC
  Administered 2015-04-05 (×2): 500 mg via INTRAVENOUS
  Filled 2015-04-05 (×2): qty 100

## 2015-04-05 NOTE — ED Notes (Signed)
Spoke with Raquel Sarna, charge RN

## 2015-04-05 NOTE — Progress Notes (Signed)
81 yr old medicare pt from [place] admitted on 03/24/15 to 04/01/15 for 5 days with diverticulosis complicated by Acute Renal failure from acute tublar necrosis Pt has been home for 4 days but returns with c/o nausea, temp 101.3 oral,  BUN 35 cr 3.13 Positive c diff. Lactic acid 2.1 bbc 20.8 troponin 0.04 Given NS bolus in ED NS at 75 cc/hr zofran iv, zosyn iv, vancomycin iv, flagyl iv Admitting with dx c diff to telemetry  Pt confirms her f/u from last hospitalization is scheduled for 04/06/15 requesting Dr Delman Cheadle be notified of her hospitalization today.  Pt scheduled to see nephrologist on 04/29/15 -Dr Justin Mend  1344 Cm called Dr Linna Darner office to reschedule f/u hospital appt for Wednesday Apr 13 2015 at 1430 Pt agreed to this appt

## 2015-04-05 NOTE — ED Notes (Signed)
Writer notified PA [facility] of abnormal I-stat lactic results.

## 2015-04-05 NOTE — ED Notes (Signed)
Vital signs stable. 

## 2015-04-05 NOTE — Progress Notes (Signed)
UR completed 

## 2015-04-05 NOTE — H&P (Addendum)
[facility] History and Physical  Toni [name] JKD:[id number] DOB: 1934/01/19 DOA: 04/04/2015  Referring physician: Ms. Antonietta Breach. PA. PCP: Unice Cobble, MD  Specialists: None.  Chief Complaint: Nausea vomiting and diarrhea.  HPI: Toni [name] is a 81 y.o. female who was recently admitted to the hospital for acute diverticulitis complicated by acute renal failure and acute pulmonary edema was brought to the ER after patient has been having persistent nausea vomiting and diarrhea since patient got discharged. Patient states she was not able to take in anything. Patient denies any abdominal pain. In the ER patient's initial lactic acid level was normal but repeat one showed more than 4. Patient was not hypotensive. CT abdomen and pelvis shows improvement of her diverticulitis. CBC shows leukocytosis and patient was febrile. Patient was given folate is normal seen bolus at this time by the ER physician patient will be admitted for sepsis most likely from intra-abdominal source. Patient denies any chest pain or shortness of breath or any productive cough. Patient states since her discharge she has not taken any Lasix. Patient abdomen on exam appears benign.   Review of Systems: As presented in the history of presenting illness, rest negative.  Past Medical History  Diagnosis Date  . PUD (peptic ulcer disease) 1987    with h pylori  . Hepatic cyst   . Migraines   . Hypertension   . Hyperlipemia   . Colonic polyp   . Diverticulosis   . Helicobacter pylori gastritis 1987   Past Surgical History  Procedure Laterality Date  . Abdominal hysterectomy      BSO ; endometriosis; ? Appendectomy incidentally  . Cholecystectomy  2008  . Rotator cuff repair      right  . Colonoscopy  2003 & 2012    polyps; Dr Collene Mares  . Endoscopy gastritis  2008  . Tonsillectomy and adenoidectomy     Social History:  reports that she quit smoking about 41 years ago. She has never used  smokeless tobacco. She reports that she drinks alcohol. She reports that she does not use illicit drugs. Where does patient live at home. Can patient participate in ADLs? Yes.  Allergies  Allergen Reactions  . Mavik [Trandolapril]     02/22/15 cough    Family History:  Family History  Problem Relation Age of Onset  . Asthma Brother   . Hypertension Father   . Heart attack Father 36  . Leukemia Mother   . Stomach cancer Maternal Aunt   . Breast cancer Sister   . Thyroid disease Sister   . Stroke Neg Hx   . Diabetes Neg Hx       Prior to Admission medications   Medication Sig Start Date End Date Taking? Authorizing Provider  amLODipine (NORVASC) 5 MG tablet Take 1 tablet (5 mg total) by mouth daily. 04/01/15  Yes Venetia Maxon Rama, MD  aspirin 81 MG tablet Take 81 mg by mouth daily.   Yes Historical Provider, MD  cholestyramine Lucrezia Starch) 4 GM/DOSE powder Take 0.5 packets (2 g total) by mouth daily. 11/23/13  Yes Hendricks Limes, MD  Coenzyme Q10 300 MG CAPS Take 1 capsule by mouth daily.    Yes Historical Provider, MD  cyanocobalamin 500 MCG tablet Take 500 mcg by mouth daily.   Yes Historical Provider, MD  cycloSPORINE (RESTASIS) 0.05 % ophthalmic emulsion Place 1 drop into both eyes 2 (two) times daily.    Yes Historical Provider, MD  docusate sodium (COLACE) 100 MG  capsule Take 100 mg by mouth 2 (two) times daily.   Yes Historical Provider, MD  donepezil (ARICEPT) 5 MG tablet Take 1 tablet (5 mg total) by mouth at bedtime. 03/21/15  Yes Hendricks Limes, MD  hydrALAZINE (APRESOLINE) 25 MG tablet TAKE 1 TABLET (25 MG TOTAL) BY MOUTH 3 (THREE) TIMES DAILY. 02/14/15  Yes Hendricks Limes, MD  LORazepam (ATIVAN) 0.5 MG tablet Take 1 tablet (0.5 mg total) by mouth at bedtime as needed for anxiety or sleep. 04/01/15  Yes Venetia Maxon Rama, MD  metoprolol tartrate (LOPRESSOR) 25 MG tablet Take 1 tablet (25 mg total) by mouth 2 (two) times daily. 06/03/14  Yes Hendricks Limes, MD  nystatin  (MYCOSTATIN) 100000 UNIT/ML suspension Take 5 mLs (500,000 Units total) by mouth 4 (four) times daily. 04/01/15  Yes Venetia Maxon Rama, MD  Probiotic Product (RESTORA) CAPS Take 1 capsule by mouth daily.    Yes Historical Provider, MD  sertraline (ZOLOFT) 50 MG tablet Take 1 tablet (50 mg total) by mouth daily. 08/18/14  Yes Hendricks Limes, MD  omeprazole (PRILOSEC) 20 MG capsule Take 1 capsule by mouth daily as needed (heartburn).  10/13/13   Historical Provider, MD    Physical Exam: Filed Vitals:   04/04/15 2318 04/05/15 0513 04/05/15 0513  BP: 163/59 148/59   Pulse: 113 101   Temp: 99.1 F (37.3 C)  101.3 F (38.5 C)  TempSrc: Oral  Oral  Resp: 18 16   Height: 5\' 5"  (1.651 m)    SpO2: 96% 97%      General:  Well-developed and nourished.  Eyes: Anicteric no pallor.  ENT: No discharge from the ears eyes nose or mouth.  Neck: No mass felt.  Cardiovascular: S1 and S2 heard.  Respiratory: No rhonchi or crepitations.  Abdomen: Soft nontender bowel sounds present. No guarding or rigidity.  Skin: There some skin changes in the abdomen probably from recent Lovenox shots.  Musculoskeletal: No edema.  Psychiatric: Appears normal. But has some problems in recalling recent incidents.  Neurologic: Alert awake oriented to name and place. Moves all extremities.  Labs on Admission:  Basic Metabolic Panel:  Recent Labs Lab 03/30/15 0416 03/31/15 0348 04/01/15 0355 04/05/15 0148  NA 136 134* 132* 128*  K 3.3* 3.2* 3.5 3.0*  CL 98 93* 92* 97  CO2 25 28 26 20   GLUCOSE 101* 116* 116* 144*  BUN 37* 41* 42* 43*  CREATININE 6.21* 5.61* 5.13* 3.38*  CALCIUM 8.1* 8.4 8.6 8.9   Liver Function Tests:  Recent Labs Lab 04/05/15 0148  AST 16  ALT 16  ALKPHOS 86  BILITOT 1.1  PROT 7.6  ALBUMIN 3.9    Recent Labs Lab 04/05/15 0148  LIPASE 30   No results for input(s): AMMONIA in the last 168 hours. CBC:  Recent Labs Lab 03/30/15 0416 04/05/15 0148  WBC 8.7 20.8*   NEUTROABS  --  18.8*  HGB 10.8* 12.4  HCT 31.1* 36.0  MCV 89.4 88.9  PLT 344 367   Cardiac Enzymes: No results for input(s): CKTOTAL, CKMB, CKMBINDEX, TROPONINI in the last 168 hours.  BNP (last 3 results) No results for input(s): BNP in the last 8760 hours.  ProBNP (last 3 results) No results for input(s): PROBNP in the last 8760 hours.  CBG: No results for input(s): GLUCAP in the last 168 hours.  Radiological Exams on Admission: Ct Abdomen Pelvis Wo Contrast  04/05/2015   CLINICAL DATA:  Nausea, vomiting and diarrhea. Recent hospitalization for  similar symptoms. History of cholecystectomy, hysterectomy, colonic polyp, diverticulosis and hepatic cyst.  EXAM: CT ABDOMEN AND PELVIS WITHOUT CONTRAST  TECHNIQUE: Multidetector CT imaging of the abdomen and pelvis was performed following the standard protocol without IV contrast.  COMPARISON:  CT of the abdomen and pelvis March 24, 2015 in CT of the abdomen and pelvis Apr 15, 2007  FINDINGS: Mild to moderate respiratory motion degraded examination.  LUNG BASES: Included view of the lung bases are clear. The visualized heart and pericardium are unremarkable.  KIDNEYS/BLADDER: Kidneys are orthotopic, demonstrating normal size and morphology. No nephrolithiasis, hydronephrosis; limited assessment for renal masses on this nonenhanced examination. 2.5 cm partially calcified mass in RIGHT renal pelvis was present in 2008. Stable 4 cm cyst lower pole of RIGHT kidney, stable 2.8 cm cyst upper pole of LEFT kidney. Exophytic LEFT upper pole 3.2 x 2.6 cm mass again seen, 26 Hounsfield units, relatively stable from 2008. The unopacified ureters are normal in course and caliber. Urinary bladder is partially distended and unremarkable.  SOLID ORGANS: The spleen, pancreas and adrenal glands are unremarkable for this non-contrast examination. Multiple low-density cysts throughout the liver, largest in RIGHT lobe measuring 9.5 x 11.6 cm, 3 Hounsfield units,  unchanged. Status post cholecystectomy.  GASTROINTESTINAL TRACT: Enteric contrast has not yet reached the distal small bowel. Small to moderate hiatal hernia. Severe sigmoid diverticulosis, with mild residual inflammatory changes. The stomach, small bowel are normal in course and caliber without inflammatory changes, the sensitivity may be decreased by lack of enteric contrast. Fluid within the colon, rectal air-fluid level. The appendix is not discretely identified, however there are no inflammatory changes in the right lower quadrant.  PERITONEUM/RETROPERITONEUM: Aortoiliac vessels are normal in course and caliber fell moderate to severe calcific atherosclerosis. No lymphadenopathy by CT size criteria. Status post cholecystectomy. No intraperitoneal free fluid nor free air.  SOFT TISSUES/ OSSEOUS STRUCTURES:  Nonsuspicious.  IMPRESSION: Motion degraded examination. Improving uncomplicated sigmoid diverticulitis.  Indeterminate renal lesions bilaterally, including partially calcified RIGHT renal pelvis lesion for which follow-up MRI is recommended with contrast on a nonemergent basis. This recommendation follows ACR consensus guidelines: Managing Incidental Findings on Abdominal CT: White Paper of the ACR Incidental Findings Committee. J Am Coll Radiol 2010;7:754-773  Stable hepatic cysts.  Status post cholecystectomy.   Electronically Signed   By: Elon Alas   On: 04/05/2015 05:20   Dg Chest Port 1 View  04/05/2015   CLINICAL DATA:  81 year old female with fever, nausea, vomiting and diarrhea.  EXAM: PORTABLE CHEST - 1 VIEW  COMPARISON:  Recent prior chest x-ray 03/28/2015  FINDINGS: Stable cardiac and mediastinal contours. Mild central bronchitic change and interstitial prominence. No pulmonary edema, pleural effusion, focal airspace consolidation or pneumothorax. No acute osseous abnormality.  IMPRESSION: No active cardiopulmonary process.   Electronically Signed   By: Jacqulynn Cadet M.D.   On:  04/05/2015 03:00     Assessment/Plan Active Problems:   Essential hypertension   Hyperlipidemia   Acute renal failure   Sepsis   Nausea vomiting and diarrhea   Diastolic CHF   1. Sepsis most likely source could be intra-abdominal - given the nausea vomiting and diarrhea we will check stool cultures and C. difficile. Patient has been empirically placed on vancomycin and Zosyn and Flagyl for now. Patient has received folate are normal saline bolus in the ER, given the patient's history of recent bone marrow edema from diastolic heart failure closely observe patient's respiratory status. For now I have placed patient on  normal saline at 100 mL/h and closely follow intake output and metabolic panel. Recheck lactic acid levels. Follow blood cultures urine cultures and procalcitonin levels. Patient is nothing by mouth for now. Patient is on sepsis protocol. 2. Acute renal failure with improving creatinine - patient during her recent admission was found to be in acute renal failure from ATN and her creatinine is gradually improving. Closely follow metabolic panel intake output. 3. Diastolic CHF with last EF measured last week was 60-65% with grade 1 diastolic dysfunction - see #1. 4. Hyponatremia and hypokalemia - probably from nausea vomiting and diarrhea. I'm waiting for the patient's repeat metabolic panel. Based on which we would have further plans on correction. 5. Recently admitted for acute sigmoid diverticulitis with CT scan showing improvement presently on empiric antibiotics - see #1. 6. Hypertension - since patient is nothing by mouth I have placed patient on when necessary IV hydralazine and scheduled dose of metoprolol 2.5 IV every 8 hourly with holding parameters. 7. Hyperlipidemia - continue home medications when patient can take orally. 8. Memory problem - continue Aricept when patient can take orally. 9. Abnormal kidney lesions in the CT abdomen will need further workup as  outpatient.   DVT Prophylaxis Lovenox.  Code Status: Full code.  Family Communication: Unable to reach patient's husband.  Disposition Plan: Admit to inpatient. Likely stay would be 2-3 days.    Brantly Kalman N. [facility] Pager [phone].  If 7PM-7AM, please contact night-coverage [email or web address] Password [facility] 04/05/2015, 7:37 AM

## 2015-04-05 NOTE — Progress Notes (Signed)
Patient Demographics  Toni [name], is a 81 y.o. female, DOB - 12/31/1933, WUX:[id number]  Admit date - 04/04/2015   Admitting Physician Modena Jansky, MD  Outpatient Primary MD for the patient is Unice Cobble, MD  LOS - 0   Chief Complaint  Patient presents with  . Emesis  . Diarrhea        Subjective:   Toni [name] today has, No headache, No chest pain, No abdominal pain - still complains on nausea, , No new weakness tingling or numbness, No Cough - SOB. Still complaints of  Diarrhea. No further vomiting since she came to the floor.  Assessment & Plan    Active Problems:   Essential hypertension   Hyperlipidemia   Acute renal failure   Sepsis   Nausea vomiting and diarrhea   Diastolic CHF   C. difficile diarrhea   sepsis secondary to C. Difficile - With recent lengthy hospitalization with antibiotic use for diverticulitis, since with nausea, vomiting and diarrhea, stool positive for C. Difficile. - Sepsis criteria on admission giving tachypnea, tachycardia and leukocytosis. - Stopped IV antibiotics, did on oral vancomycin, may DC IV Flagyl if tolerating oral intake. - Continue with IV fluid,  - Lactic acid normalized  Acute renal failure - Appears to be improving, baseline creatinine is less than 1, recently discharged with a creatinine of 5, overall creatinine appears to be trending down, continue with gentle hydration.  Diastolic CHF - Last EF 25-36% with a grade 1 diastolic dysfunction . - Patient appears to be dehydrated and hypovolemic, and chin you with gentle hydration.  Hyponatremia - Secondary to volume depletion, continue with IV normal saline.  Hypokalemia - Secondary to diarrhea, will replace, monitor closely.  Hypertension - On the higher side, resume home medication.  Abnormal kidney lesions in the CT abdomen  will need further workup as outpatient.   Code Status: Full  Family Communication: husband at bedside  Disposition Plan: PT/OT   Procedures  None   Consults  None   Medications  Scheduled Meds: . enoxaparin (LOVENOX) injection  30 mg Subcutaneous Q24H  . metoprolol  2.5 mg Intravenous 3 times per day  . metronidazole  500 mg Intravenous Q8H  . piperacillin-tazobactam (ZOSYN)  IV  2.25 g Intravenous Q8H  . vancomycin  125 mg Oral QID  . [START ON 04/07/2015] vancomycin  1,000 mg Intravenous Q48H   Continuous Infusions: . sodium chloride 75 mL/hr at 04/05/15 1209   PRN Meds:.acetaminophen **OR** acetaminophen, hydrALAZINE, ondansetron **OR** ondansetron (ZOFRAN) IV  DVT Prophylaxis  Lovenox -  Lab Results  Component Value Date   PLT 334 04/05/2015    Antibiotics    Anti-infectives    Start     Dose/Rate Route Frequency Ordered Stop   04/07/15 0600  vancomycin (VANCOCIN) IVPB 1000 mg/200 mL premix     1,000 mg 200 mL/hr over 60 Minutes Intravenous Every 48 hours 04/05/15 0532     04/05/15 1600  piperacillin-tazobactam (ZOSYN) IVPB 2.25 g     2.25 g 100 mL/hr over 30 Minutes Intravenous Every 8 hours 04/05/15 1510     04/05/15 1400  piperacillin-tazobactam (ZOSYN) IVPB 2.25 g  Status:  Discontinued     2.25 g 100 mL/hr over 30 Minutes  Intravenous Every 8 hours 04/05/15 0530 04/05/15 1016   04/05/15 1100  vancomycin (VANCOCIN) 50 mg/mL oral solution 125 mg     125 mg Oral 4 times daily 04/05/15 1019 04/19/15 0959   04/05/15 0815  metroNIDAZOLE (FLAGYL) IVPB 500 mg     500 mg 100 mL/hr over 60 Minutes Intravenous Every 8 hours 04/05/15 0801     04/05/15 0600  vancomycin (VANCOCIN) IVPB 1000 mg/200 mL premix     1,000 mg 200 mL/hr over 60 Minutes Intravenous STAT 04/05/15 0531 04/05/15 0812   04/05/15 0530  piperacillin-tazobactam (ZOSYN) IVPB 2.25 g     2.25 g 100 mL/hr over 30 Minutes Intravenous STAT 04/05/15 0523 04/05/15 0712          Objective:    Filed Vitals:   04/05/15 1300 04/05/15 1312 04/05/15 1400 04/05/15 1500  BP: 143/61 143/61 121/40 154/61  Pulse: 83 86 90 87  Temp:    98.5 F (36.9 C)  TempSrc:    Oral  Resp:  16  24  Height:    5\' 5"  (1.651 m)  Weight:    79.289 kg (174 lb 12.8 oz)  SpO2: 93% 97% 92% 99%    Wt Readings from Last 3 Encounters:  04/05/15 79.289 kg (174 lb 12.8 oz)  03/31/15 79.3 kg (174 lb 13.2 oz)  03/22/15 85.276 kg (188 lb)     Intake/Output Summary (Last 24 hours) at 04/05/15 1556 Last data filed at 04/05/15 1146  Gross per 24 hour  Intake      5 ml  Output      0 ml  Net      5 ml     Physical Exam  Awake Alert, Oriented X 3, frail, No new F.N deficits, Normal affect [place].AT,PERRAL Supple Neck,No JVD, No cervical lymphadenopathy appriciated.  Symmetrical Chest wall movement, Good air movement bilaterally, RRR,No Gallops,Rubs or new Murmurs, No Parasternal Heave +ve B.Sounds, Abd Soft, No tenderness, No organomegaly appriciated, No rebound - guarding or rigidity. No Cyanosis, Clubbing or edema, No new Rash or bruise     Data Review   Micro Results Recent Results (from the past 240 hour(s))  Clostridium Difficile by PCR     Status: Abnormal   Collection Time: 04/05/15  7:59 AM  Result Value Ref Range Status   C difficile by pcr POSITIVE (A) NEGATIVE Final    Comment: CRITICAL RESULT CALLED TO, READ BACK BY AND VERIFIED WITH: A. WARD RN AT 1000 ON 04.26.16 BY [facility]     Radiology Reports Ct Abdomen Pelvis Wo Contrast  04/05/2015   CLINICAL DATA:  Nausea, vomiting and diarrhea. Recent hospitalization for similar symptoms. History of cholecystectomy, hysterectomy, colonic polyp, diverticulosis and hepatic cyst.  EXAM: CT ABDOMEN AND PELVIS WITHOUT CONTRAST  TECHNIQUE: Multidetector CT imaging of the abdomen and pelvis was performed following the standard protocol without IV contrast.  COMPARISON:  CT of the abdomen and pelvis March 24, 2015 in CT of the abdomen and pelvis Apr 15, 2007  FINDINGS: Mild to moderate respiratory motion degraded examination.  LUNG BASES: Included view of the lung bases are clear. The visualized heart and pericardium are unremarkable.  KIDNEYS/BLADDER: Kidneys are orthotopic, demonstrating normal size and morphology. No nephrolithiasis, hydronephrosis; limited assessment for renal masses on this nonenhanced examination. 2.5 cm partially calcified mass in RIGHT renal pelvis was present in 2008. Stable 4 cm cyst lower pole of RIGHT kidney, stable 2.8 cm cyst upper pole of LEFT kidney. Exophytic LEFT upper pole  3.2 x 2.6 cm mass again seen, 26 Hounsfield units, relatively stable from 2008. The unopacified ureters are normal in course and caliber. Urinary bladder is partially distended and unremarkable.  SOLID ORGANS: The spleen, pancreas and adrenal glands are unremarkable for this non-contrast examination. Multiple low-density cysts throughout the liver, largest in RIGHT lobe measuring 9.5 x 11.6 cm, 3 Hounsfield units, unchanged. Status post cholecystectomy.  GASTROINTESTINAL TRACT: Enteric contrast has not yet reached the distal small bowel. Small to moderate hiatal hernia. Severe sigmoid diverticulosis, with mild residual inflammatory changes. The stomach, small bowel are normal in course and caliber without inflammatory changes, the sensitivity may be decreased by lack of enteric contrast. Fluid within the colon, rectal air-fluid level. The appendix is not discretely identified, however there are no inflammatory changes in the right lower quadrant.  PERITONEUM/RETROPERITONEUM: Aortoiliac vessels are normal in course and caliber fell moderate to severe calcific atherosclerosis. No lymphadenopathy by CT size criteria. Status post cholecystectomy. No intraperitoneal free fluid nor free air.  SOFT TISSUES/ OSSEOUS STRUCTURES:  Nonsuspicious.  IMPRESSION: Motion degraded examination. Improving uncomplicated sigmoid diverticulitis.  Indeterminate renal lesions  bilaterally, including partially calcified RIGHT renal pelvis lesion for which follow-up MRI is recommended with contrast on a nonemergent basis. This recommendation follows ACR consensus guidelines: Managing Incidental Findings on Abdominal CT: White Paper of the ACR Incidental Findings Committee. J Am Coll Radiol 2010;7:754-773  Stable hepatic cysts.  Status post cholecystectomy.   Electronically Signed   By: Elon Alas   On: 04/05/2015 05:20   Dg Chest Port 1 View  04/05/2015   CLINICAL DATA:  81 year old female with fever, nausea, vomiting and diarrhea.  EXAM: PORTABLE CHEST - 1 VIEW  COMPARISON:  Recent prior chest x-ray 03/28/2015  FINDINGS: Stable cardiac and mediastinal contours. Mild central bronchitic change and interstitial prominence. No pulmonary edema, pleural effusion, focal airspace consolidation or pneumothorax. No acute osseous abnormality.  IMPRESSION: No active cardiopulmonary process.   Electronically Signed   By: Jacqulynn Cadet M.D.   On: 04/05/2015 03:00    CBC  Recent Labs Lab 03/30/15 0416 04/05/15 0148 04/05/15 0820  WBC 8.7 20.8* 17.3*  HGB 10.8* 12.4 10.7*  HCT 31.1* 36.0 31.6*  PLT 344 367 334  MCV 89.4 88.9 88.8  MCH 31.0 30.6 30.1  MCHC 34.7 34.4 33.9  RDW 13.1 13.2 13.3  LYMPHSABS  --  0.5* 0.7  MONOABS  --  1.5* 0.8  EOSABS  --  0.1 0.0  BASOSABS  --  0.0 0.0    Chemistries   Recent Labs Lab 03/30/15 0416 03/31/15 0348 04/01/15 0355 04/05/15 0148 04/05/15 0820  NA 136 134* 132* 128* 130*  K 3.3* 3.2* 3.5 3.0* 3.0*  CL 98 93* 92* 97 99  CO2 25 28 26 20  18*  GLUCOSE 101* 116* 116* 144* 173*  BUN 37* 41* 42* 43* 35*  CREATININE 6.21* 5.61* 5.13* 3.38* 3.13*  CALCIUM 8.1* 8.4 8.6 8.9 8.1*  AST  --   --   --  16 17  ALT  --   --   --  16 13  ALKPHOS  --   --   --  86 74  BILITOT  --   --   --  1.1 0.9   ------------------------------------------------------------------------------------------------------------------ estimated  creatinine clearance is 15.2 mL/min (by C-G formula based on Cr of 3.13). ------------------------------------------------------------------------------------------------------------------ No results for input(s): HGBA1C in the last 72 hours. ------------------------------------------------------------------------------------------------------------------ No results for input(s): CHOL, HDL, LDLCALC, TRIG, CHOLHDL, LDLDIRECT in the last  72 hours. ------------------------------------------------------------------------------------------------------------------ No results for input(s): TSH, T4TOTAL, T3FREE, THYROIDAB in the last 72 hours.  Invalid input(s): FREET3 ------------------------------------------------------------------------------------------------------------------ No results for input(s): VITAMINB12, FOLATE, FERRITIN, TIBC, IRON, RETICCTPCT in the last 72 hours.  Coagulation profile  Recent Labs Lab 04/05/15 0820  INR 1.20    No results for input(s): DDIMER in the last 72 hours.  Cardiac Enzymes  Recent Labs Lab 04/05/15 0820  TROPONINI 0.04*   ------------------------------------------------------------------------------------------------------------------ Invalid input(s): POCBNP     Time Spent in minutes   30 minutes    ELGERGAWY, DAWOOD M.D on 04/05/2015 at 3:56 PM  Between 7am to 7pm - Pager - [phone]  After 7pm go to [email or web address] - password [email or web address]  And look for the night coverage person covering for me after hours  [facility] Office  [phone]   **Disclaimer: This note may have been dictated with voice recognition software. Similar sounding words can inadvertently be transcribed and this note may contain transcription errors which may not have been corrected upon publication of note.**

## 2015-04-05 NOTE — ED Notes (Signed)
Critical Lab CDIFF POSITIVE  Called back and verified with Toni [name] in lab Primary nurse notified

## 2015-04-05 NOTE — Progress Notes (Addendum)
ANTIBIOTIC CONSULT NOTE - INITIAL  Pharmacy Consult for Zosyn/Vancomycin Indication: Diverticulitis/Sepsis  Allergies  Allergen Reactions  . Mavik [Trandolapril]     02/22/15 cough    Patient Measurements: Height: 5\' 5"  (165.1 cm) IBW/kg (Calculated) : 57  Vital Signs: Temp: 101.3 F (38.5 C) (04/26 0513) Temp Source: Oral (04/26 0513) BP: 148/59 mmHg (04/26 0513) Pulse Rate: 101 (04/26 0513) Intake/Output from previous day:   Intake/Output from this shift:    Labs:  Recent Labs  04/05/15 0148  WBC 20.8*  HGB 12.4  PLT 367  CREATININE 3.38*   Estimated Creatinine Clearance: 14 mL/min (by C-G formula based on Cr of 3.38). No results for input(s): VANCOTROUGH, VANCOPEAK, VANCORANDOM, GENTTROUGH, GENTPEAK, GENTRANDOM, TOBRATROUGH, TOBRAPEAK, TOBRARND, AMIKACINPEAK, AMIKACINTROU, AMIKACIN in the last 72 hours.   Microbiology: Recent Results (from the past 720 hour(s))  Clostridium Difficile by PCR     Status: None   Collection Time: 03/24/15  5:48 PM  Result Value Ref Range Status   C difficile by pcr NEGATIVE NEGATIVE Final  MRSA PCR Screening     Status: Abnormal   Collection Time: 03/24/15  5:48 PM  Result Value Ref Range Status   MRSA by PCR INVALID RESULTS, SPECIMEN SENT FOR CULTURE (A) NEGATIVE Final    Comment:        The GeneXpert MRSA Assay (FDA approved for NASAL specimens only), is one component of a comprehensive MRSA colonization surveillance program. It is not intended to diagnose MRSA infection nor to guide or monitor treatment for MRSA infections. MAY,B/2W @2110  ON 03/24/15 BY KARCZEWSKI,S.   MRSA culture     Status: None   Collection Time: 03/24/15  5:48 PM  Result Value Ref Range Status   Specimen Description NOSE  Final   Special Requests NONE  Final   Culture   Final    NO GROWTH 2 DAYS Note: No MRSA Isolated Performed at [facility]    Report Status 03/27/2015 FINAL  Final  Culture, blood (routine x 2)     Status: None    Collection Time: 03/24/15  6:00 PM  Result Value Ref Range Status   Specimen Description BLOOD R HAND  Final   Special Requests Normal  Final   Culture   Final    NO GROWTH 5 DAYS Performed at [facility]    Report Status 03/31/2015 FINAL  Final  Culture, blood (routine x 2)     Status: None   Collection Time: 03/24/15  6:10 PM  Result Value Ref Range Status   Specimen Description BLOOD R ARM  Final   Special Requests Normal  Final   Culture   Final    NO GROWTH 5 DAYS Performed at [facility]    Report Status 03/31/2015 FINAL  Final    Medical History: Past Medical History  Diagnosis Date  . PUD (peptic ulcer disease) 1987    with h pylori  . Hepatic cyst   . Migraines   . Hypertension   . Hyperlipemia   . Colonic polyp   . Diverticulosis   . Helicobacter pylori gastritis 1987    Medications:  Scheduled:   Infusions:  . piperacillin-tazobactam (ZOSYN)  IV     Assessment:  81 yr female with complaints of nausea/vomiting/diarrhea.  Recent discharge from the hospital on 4/22  CT abdomen/pelvis shows diverticulitis  Pharmacy consulted to dose Zosyn and Vancomycin  Scr = 3.38 with CrCl ~ 14 ml/min  Blood cultures ordered  Goal of Therapy:  Eradication of infection Vancomycin trough level : 15-20 mcg/ml  Plan:  Zosyn 2.25gm IV q8h Vancomycin 1000mg  IV q48h F/u cultures/sensitivities  Adetokunbo Mccadden, Toribio Harbour, PharmD 04/05/2015,5:23 AM

## 2015-04-05 NOTE — ED Provider Notes (Signed)
CSN: [id number]     Arrival date & time 04/04/15  2315 History   First MD Initiated Contact with Patient 04/05/15 0017     Chief Complaint  Patient presents with  . Emesis  . Diarrhea    (Consider location/radiation/quality/duration/timing/severity/associated sxs/prior Treatment) HPI Comments: 81 year old female with a history of peptic ulcer disease, hypertension, hyperlipidemia, and diverticulosis presents to the emergency department for further evaluation of vomiting and diarrhea. Patient with vomiting which began today. She also had worsening in diarrhea yesterday. Husband and/or patient deny any associated fever, chest pain, shortness of breath, abdominal pain, abdominal distention, hematemesis, melena, hematochezia, or dysuria. Patient has been complaining of a mild headache as well as generalized weakness. Husband reports that patient has been eating very little.  Patient was discharged from the hospital on 04/01/2015 after a 9 day stay for acute sigmoid diverticulitis. She had a negative C. difficile test during this admission. Her admission was complicated by acute renal failure as well as acute diastolic heart failure and pulmonary edema. Patient has not been on antibiotics since her discharge. Husband states that patient has not required Lasix since her discharge as well. No history of abdominal surgeries.  PCP - Dr. Linna Darner  Patient is a 81 y.o. female presenting with vomiting and diarrhea. The history is provided by the patient and the spouse. No language interpreter was used.  Emesis Associated symptoms: diarrhea and headaches   Diarrhea Associated symptoms: headaches and vomiting   Associated symptoms: no fever     Past Medical History  Diagnosis Date  . PUD (peptic ulcer disease) 1987    with h pylori  . Hepatic cyst   . Migraines   . Hypertension   . Hyperlipemia   . Colonic polyp   . Diverticulosis   . Helicobacter pylori gastritis 1987   Past Surgical History   Procedure Laterality Date  . Abdominal hysterectomy      BSO ; endometriosis; ? Appendectomy incidentally  . Cholecystectomy  2008  . Rotator cuff repair      right  . Colonoscopy  2003 & 2012    polyps; Dr Collene Mares  . Endoscopy gastritis  2008  . Tonsillectomy and adenoidectomy     Family History  Problem Relation Age of Onset  . Asthma Brother   . Hypertension Father   . Heart attack Father 80  . Leukemia Mother   . Stomach cancer Maternal Aunt   . Breast cancer Sister   . Thyroid disease Sister   . Stroke Neg Hx   . Diabetes Neg Hx    History  Substance Use Topics  . Smoking status: Former Smoker    Quit date: 12/10/1973  . Smokeless tobacco: Never Used  . Alcohol Use: 0.0 oz/week    0 Standard drinks or equivalent per week     Comment:  socially   OB History    No data available      Review of Systems  Constitutional: Negative for fever.  Respiratory: Negative for shortness of breath.   Cardiovascular: Negative for chest pain.  Gastrointestinal: Positive for nausea, vomiting and diarrhea.  Genitourinary: Negative for dysuria.  Neurological: Positive for weakness (generalized) and headaches.  All other systems reviewed and are negative.   Allergies  Mavik  Home Medications   Prior to Admission medications   Medication Sig Start Date End Date Taking? Authorizing Provider  amLODipine (NORVASC) 5 MG tablet Take 1 tablet (5 mg total) by mouth daily. 04/01/15  Yes [place],  MD  aspirin 81 MG tablet Take 81 mg by mouth daily.   Yes Historical Provider, MD  cholestyramine Lucrezia Starch) 4 GM/DOSE powder Take 0.5 packets (2 g total) by mouth daily. 11/23/13  Yes Hendricks Limes, MD  Coenzyme Q10 300 MG CAPS Take 1 capsule by mouth daily.    Yes Historical Provider, MD  cyanocobalamin 500 MCG tablet Take 500 mcg by mouth daily.   Yes Historical Provider, MD  cycloSPORINE (RESTASIS) 0.05 % ophthalmic emulsion Place 1 drop into both eyes 2 (two) times daily.     Yes Historical Provider, MD  docusate sodium (COLACE) 100 MG capsule Take 100 mg by mouth 2 (two) times daily.   Yes Historical Provider, MD  donepezil (ARICEPT) 5 MG tablet Take 1 tablet (5 mg total) by mouth at bedtime. 03/21/15  Yes Hendricks Limes, MD  hydrALAZINE (APRESOLINE) 25 MG tablet TAKE 1 TABLET (25 MG TOTAL) BY MOUTH 3 (THREE) TIMES DAILY. 02/14/15  Yes Hendricks Limes, MD  LORazepam (ATIVAN) 0.5 MG tablet Take 1 tablet (0.5 mg total) by mouth at bedtime as needed for anxiety or sleep. 04/01/15  Yes Venetia Maxon Rama, MD  metoprolol tartrate (LOPRESSOR) 25 MG tablet Take 1 tablet (25 mg total) by mouth 2 (two) times daily. 06/03/14  Yes Hendricks Limes, MD  nystatin (MYCOSTATIN) 100000 UNIT/ML suspension Take 5 mLs (500,000 Units total) by mouth 4 (four) times daily. 04/01/15  Yes Venetia Maxon Rama, MD  Probiotic Product (RESTORA) CAPS Take 1 capsule by mouth daily.    Yes Historical Provider, MD  sertraline (ZOLOFT) 50 MG tablet Take 1 tablet (50 mg total) by mouth daily. 08/18/14  Yes Hendricks Limes, MD  omeprazole (PRILOSEC) 20 MG capsule Take 1 capsule by mouth daily as needed (heartburn).  10/13/13   Historical Provider, MD   BP 148/59 mmHg  Pulse 101  Temp(Src) 101.3 F (38.5 C) (Oral)  Resp 18  Ht 5\' 5"  (1.651 m)  SpO2 97%   Physical Exam  Constitutional: She is oriented to person, place, and time. She appears well-developed and well-nourished. No distress.  Pale appearing. Patient does not appear septic.  HENT:  Head: Normocephalic and atraumatic.  Eyes: Conjunctivae and EOM are normal. No scleral icterus.  Neck: Normal range of motion.  Cardiovascular: Regular rhythm and intact distal pulses.   Mild tachycardia  Pulmonary/Chest: Effort normal and breath sounds normal. No respiratory distress. She has no wheezes. She has no rales.  Lungs CTAB. Chest expansion symmetric.  Abdominal: Soft. She exhibits no distension. There is tenderness. There is no rebound and no guarding.   Mild RLQ and suprapubic TTP. No masses or involuntary guarding. No peritoneal signs. Abdomen soft.  Musculoskeletal: Normal range of motion.  Neurological: She is alert and oriented to person, place, and time. She exhibits normal muscle tone. Coordination normal.  GCS 15. Speech is goal oriented. No focal neurologic deficits appreciated.  Skin: Skin is warm and dry. No rash noted. She is not diaphoretic. No erythema. No pallor.  Psychiatric: She has a normal mood and affect. Her behavior is normal.  Nursing note and vitals reviewed.   ED Course  Procedures (including critical care time) Labs Review Labs Reviewed  CBC WITH DIFFERENTIAL/PLATELET - Abnormal; Notable for the following:    WBC 20.8 (*)    Neutrophils Relative % 91 (*)    Neutro Abs 18.8 (*)    Lymphocytes Relative 2 (*)    Lymphs Abs 0.5 (*)    Monocytes  Absolute 1.5 (*)    All other components within normal limits  COMPREHENSIVE METABOLIC PANEL - Abnormal; Notable for the following:    Sodium 128 (*)    Potassium 3.0 (*)    Glucose, Bld 144 (*)    BUN 43 (*)    Creatinine, Ser 3.38 (*)    GFR calc non Af Amer 12 (*)    GFR calc Af Amer 14 (*)    All other components within normal limits  URINALYSIS, ROUTINE W REFLEX MICROSCOPIC - Abnormal; Notable for the following:    APPearance CLOUDY (*)    Leukocytes, UA TRACE (*)    All other components within normal limits  URINE MICROSCOPIC-ADD ON - Abnormal; Notable for the following:    Bacteria, UA FEW (*)    Casts GRANULAR CAST (*)    All other components within normal limits  I-STAT CG4 LACTIC ACID, ED - Abnormal; Notable for the following:    Lactic Acid, Venous 4.21 (*)    All other components within normal limits  CULTURE, BLOOD (ROUTINE X 2)  CULTURE, BLOOD (ROUTINE X 2)  LIPASE, BLOOD  I-STAT CG4 LACTIC ACID, ED    Imaging Review Ct Abdomen Pelvis Wo Contrast  04/05/2015   CLINICAL DATA:  Nausea, vomiting and diarrhea. Recent hospitalization for similar  symptoms. History of cholecystectomy, hysterectomy, colonic polyp, diverticulosis and hepatic cyst.  EXAM: CT ABDOMEN AND PELVIS WITHOUT CONTRAST  TECHNIQUE: Multidetector CT imaging of the abdomen and pelvis was performed following the standard protocol without IV contrast.  COMPARISON:  CT of the abdomen and pelvis March 24, 2015 in CT of the abdomen and pelvis Apr 15, 2007  FINDINGS: Mild to moderate respiratory motion degraded examination.  LUNG BASES: Included view of the lung bases are clear. The visualized heart and pericardium are unremarkable.  KIDNEYS/BLADDER: Kidneys are orthotopic, demonstrating normal size and morphology. No nephrolithiasis, hydronephrosis; limited assessment for renal masses on this nonenhanced examination. 2.5 cm partially calcified mass in RIGHT renal pelvis was present in 2008. Stable 4 cm cyst lower pole of RIGHT kidney, stable 2.8 cm cyst upper pole of LEFT kidney. Exophytic LEFT upper pole 3.2 x 2.6 cm mass again seen, 26 Hounsfield units, relatively stable from 2008. The unopacified ureters are normal in course and caliber. Urinary bladder is partially distended and unremarkable.  SOLID ORGANS: The spleen, pancreas and adrenal glands are unremarkable for this non-contrast examination. Multiple low-density cysts throughout the liver, largest in RIGHT lobe measuring 9.5 x 11.6 cm, 3 Hounsfield units, unchanged. Status post cholecystectomy.  GASTROINTESTINAL TRACT: Enteric contrast has not yet reached the distal small bowel. Small to moderate hiatal hernia. Severe sigmoid diverticulosis, with mild residual inflammatory changes. The stomach, small bowel are normal in course and caliber without inflammatory changes, the sensitivity may be decreased by lack of enteric contrast. Fluid within the colon, rectal air-fluid level. The appendix is not discretely identified, however there are no inflammatory changes in the right lower quadrant.  PERITONEUM/RETROPERITONEUM: Aortoiliac vessels  are normal in course and caliber fell moderate to severe calcific atherosclerosis. No lymphadenopathy by CT size criteria. Status post cholecystectomy. No intraperitoneal free fluid nor free air.  SOFT TISSUES/ OSSEOUS STRUCTURES:  Nonsuspicious.  IMPRESSION: Motion degraded examination. Improving uncomplicated sigmoid diverticulitis.  Indeterminate renal lesions bilaterally, including partially calcified RIGHT renal pelvis lesion for which follow-up MRI is recommended with contrast on a nonemergent basis. This recommendation follows ACR consensus guidelines: Managing Incidental Findings on Abdominal CT: White Paper of the ACR Incidental  Findings Committee. J Am Coll Radiol 2010;7:754-773  Stable hepatic cysts.  Status post cholecystectomy.   Electronically Signed   By: Elon Alas   On: 04/05/2015 05:20   Dg Chest Port 1 View  04/05/2015   CLINICAL DATA:  81 year old female with fever, nausea, vomiting and diarrhea.  EXAM: PORTABLE CHEST - 1 VIEW  COMPARISON:  Recent prior chest x-ray 03/28/2015  FINDINGS: Stable cardiac and mediastinal contours. Mild central bronchitic change and interstitial prominence. No pulmonary edema, pleural effusion, focal airspace consolidation or pneumothorax. No acute osseous abnormality.  IMPRESSION: No active cardiopulmonary process.   Electronically Signed   By: Jacqulynn Cadet M.D.   On: 04/05/2015 03:00     EKG Interpretation None       CRITICAL CARE Performed by: Antonietta Breach   Total critical care time: 35  Critical care time was exclusive of separately billable procedures and treating other patients.  Critical care was necessary to treat or prevent imminent or life-threatening deterioration.  Critical care was time spent personally by me on the following activities: development of treatment plan with patient and/or surrogate as well as nursing, discussions with consultants, evaluation of patient's response to treatment, examination of patient,  obtaining history from patient or surrogate, ordering and performing treatments and interventions, ordering and review of laboratory studies, ordering and review of radiographic studies, pulse oximetry and re-evaluation of patient's condition.   Medications  piperacillin-tazobactam (ZOSYN) IVPB 2.25 g (not administered)  sodium chloride 0.9 % bolus 1,000 mL (not administered)  acetaminophen (TYLENOL) suppository 650 mg (not administered)  piperacillin-tazobactam (ZOSYN) IVPB 2.25 g (not administered)  vancomycin (VANCOCIN) IVPB 1000 mg/200 mL premix (not administered)  vancomycin (VANCOCIN) IVPB 1000 mg/200 mL premix (not administered)  sodium chloride 0.9 % bolus 1,000 mL (not administered)  sodium chloride 0.9 % bolus 1,000 mL (0 mLs Intravenous Stopped 04/05/15 0326)  acetaminophen (TYLENOL) suppository 650 mg (650 mg Rectal Given 04/05/15 0157)  metoCLOPramide (REGLAN) injection 5 mg (5 mg Intravenous Given 04/05/15 0156)  iohexol (OMNIPAQUE) 300 MG/ML solution 50 mL (50 mLs Oral Contrast Given 04/05/15 0213)  sodium chloride 0.9 % bolus 1,000 mL (0 mLs Intravenous Stopped 04/05/15 0510)    MDM   Final diagnoses:  Sepsis, due to unspecified organism  Sigmoid diverticulitis    81 year old female presents to the emergency department today for further evaluation of nausea, vomiting, and diarrhea. She was discharged from the hospital 3 days ago after a 9 day stay for acute sigmoid diverticulitis complicated by acute renal failure, acute diastolic heart failure, and pulmonary edema. Patient febrile on arrival with mild tachycardia. Fever has persisted despite treatment with Tylenol suppository. Patient has received 3 L IV fluids for hydration. She has not had any hypotension.  Leukocytosis worsened from discharge to 20.8. Creatinine function is improving since discharge. Urinalysis does not suggest infection. Lactic acid also trending up over ED course, from 1.41 to 4.21. Despite fluid  hydration. Blood cultures pending.  CT abdomen pelvis repeated which shows improving sigmoid diverticulitis; no evidence of abscess or perforation. Chest x-ray negative for pneumonia. Patient started on Vancomycin and Zosyn per sepsis protocol. C diff and GI pathogen panel added. Case discussed with Dr. Hal Hope of [facility] hospitalist service who will admit the patient for further management of her symptoms today. Patient seen also by my attending, Dr. Colin Rhein, prior to admission. Temp admit orders placed for Step Down bed.   Filed Vitals:   04/04/15 2318 04/05/15 0513 04/05/15 0513  BP: 163/59 148/59  Pulse: 113 101   Temp: 99.1 F (37.3 C)  101.3 F (38.5 C)  TempSrc: Oral  Oral  Resp: 18    Height: 5\' 5"  (1.651 m)    SpO2: 96% 97%      Antonietta Breach, PA-C 04/05/15 1093  Debby Freiberg, MD 04/09/15 [phone]

## 2015-04-05 NOTE — ED Notes (Signed)
Critical Lactic Acid 2.1 Read back and verified with Crystal in lab  Primary RN notified

## 2015-04-06 ENCOUNTER — Inpatient Hospital Stay: Payer: Medicare Other | Admitting: Internal Medicine

## 2015-04-06 DIAGNOSIS — E785 Hyperlipidemia, unspecified: Secondary | ICD-10-CM

## 2015-04-06 DIAGNOSIS — A047 Enterocolitis due to Clostridium difficile: Secondary | ICD-10-CM

## 2015-04-06 DIAGNOSIS — N170 Acute kidney failure with tubular necrosis: Secondary | ICD-10-CM

## 2015-04-06 LAB — CBC
HEMATOCRIT: 30.7 % — AB (ref 36.0–46.0)
Hemoglobin: 10.4 g/dL — ABNORMAL LOW (ref 12.0–15.0)
MCH: 30.1 pg (ref 26.0–34.0)
MCHC: 33.9 g/dL (ref 30.0–36.0)
MCV: 88.7 fL (ref 78.0–100.0)
Platelets: 297 10*3/uL (ref 150–400)
RBC: 3.46 MIL/uL — AB (ref 3.87–5.11)
RDW: 13.3 % (ref 11.5–15.5)
WBC: 14.2 10*3/uL — ABNORMAL HIGH (ref 4.0–10.5)

## 2015-04-06 LAB — GLUCOSE, CAPILLARY
GLUCOSE-CAPILLARY: 117 mg/dL — AB (ref 70–99)
Glucose-Capillary: 101 mg/dL — ABNORMAL HIGH (ref 70–99)
Glucose-Capillary: 102 mg/dL — ABNORMAL HIGH (ref 70–99)
Glucose-Capillary: 106 mg/dL — ABNORMAL HIGH (ref 70–99)
Glucose-Capillary: 108 mg/dL — ABNORMAL HIGH (ref 70–99)
Glucose-Capillary: 122 mg/dL — ABNORMAL HIGH (ref 70–99)

## 2015-04-06 LAB — BASIC METABOLIC PANEL
Anion gap: 9 (ref 5–15)
Anion gap: 7 (ref 5–15)
BUN: 31 mg/dL — AB (ref 6–23)
BUN: 30 mg/dL — AB (ref 6–23)
CO2: 21 mmol/L (ref 19–32)
CO2: 20 mmol/L (ref 19–32)
CREATININE: 2.77 mg/dL — AB (ref 0.50–1.10)
CREATININE: 2.62 mg/dL — AB (ref 0.50–1.10)
Calcium: 8 mg/dL — ABNORMAL LOW (ref 8.4–10.5)
Calcium: 8 mg/dL — ABNORMAL LOW (ref 8.4–10.5)
Chloride: 103 mmol/L (ref 96–112)
Chloride: 106 mmol/L (ref 96–112)
GFR calc non Af Amer: 15 mL/min — ABNORMAL LOW (ref >90)
GFR, EST AFRICAN AMERICAN: 18 mL/min — AB (ref >90)
GFR, EST AFRICAN AMERICAN: 19 mL/min — AB (ref >90)
GFR, EST NON AFRICAN AMERICAN: 16 mL/min — AB (ref >90)
GLUCOSE: 125 mg/dL — AB (ref 70–99)
Glucose, Bld: 121 mg/dL — ABNORMAL HIGH (ref 70–99)
POTASSIUM: 2.7 mmol/L — AB (ref 3.5–5.1)
POTASSIUM: 3.3 mmol/L — AB (ref 3.5–5.1)
Sodium: 133 mmol/L — ABNORMAL LOW (ref 135–145)
Sodium: 133 mmol/L — ABNORMAL LOW (ref 135–145)

## 2015-04-06 LAB — MAGNESIUM: Magnesium: 1.5 mg/dL (ref 1.5–2.5)

## 2015-04-06 MED ORDER — POTASSIUM CHLORIDE IN NACL 40-0.9 MEQ/L-% IV SOLN
INTRAVENOUS | Status: AC
  Administered 2015-04-06 – 2015-04-07 (×2): 75 mL/h via INTRAVENOUS
  Filled 2015-04-06 (×2): qty 1000

## 2015-04-06 MED ORDER — POTASSIUM CHLORIDE 10 MEQ/100ML IV SOLN
10.0000 meq | INTRAVENOUS | Status: AC
  Administered 2015-04-06 (×3): 10 meq via INTRAVENOUS
  Filled 2015-04-06 (×3): qty 100

## 2015-04-06 NOTE — Progress Notes (Signed)
Pt HR became irregular. PER CMT, it looked like Vtach. MD  Made aware. Will continue to monitor.

## 2015-04-06 NOTE — Progress Notes (Signed)
PT Cancellation Note / Screen  Patient Details Name: Toni [name] MRN: [id number] DOB: 07/17/1934   Cancelled Treatment:    Reason Eval/Treat Not Completed: PT screened, no needs identified, will sign off  Pt familiar with PT from previous admission and states she does not have needs at this time.  Since pt declines PT, PT will sign off.   Baneza Bartoszek,KATHrine E 04/06/2015, 12:02 PM Carmelia Bake, PT, DPT 04/06/2015 Pager: [phone]

## 2015-04-06 NOTE — Progress Notes (Signed)
Patient Demographics  Toni [name], is a 81 y.o. female, DOB - 1933/12/31, DXA:[id number]  Admit date - 04/04/2015   Admitting Physician Modena Jansky, MD  Outpatient Primary MD for the patient is Unice Cobble, MD  LOS - 1   Chief Complaint  Patient presents with  . Emesis  . Diarrhea        Subjective:   Had about 6-7 watery bowel movement yesterday. No blood. Denies any nausea or vomiting.  Assessment & Plan    Active Problems:   Essential hypertension   Hyperlipidemia   Acute renal failure   Sepsis   Nausea vomiting and diarrhea   Diastolic CHF   C. difficile diarrhea   sepsis secondary to C. Difficile - With recent lengthy hospitalization with antibiotic use for diverticulitis, since with nausea, vomiting and diarrhea, stool positive for C. Difficile. - Sepsis criteria on admission giving tachypnea, tachycardia and leukocytosis. - Stopped IV antibiotics, did on oral vancomycin, IV Flagyl discontinued. - Continue with IV fluid,  - Lactic acid normalized - Appears to be slightly improving since yesterday, denies nausea and vomiting, continue vancomycin.  Acute renal failure - Appears to be improving, baseline creatinine is less than 1, recently discharged with a creatinine of 5, overall creatinine appears to be trending down, continue with gentle hydration.  Diastolic CHF - Last EF 20-94% with a grade 1 diastolic dysfunction . - Patient appears to be dehydrated and hypovolemic, and chin you with gentle hydration.  Hyponatremia - Secondary to volume depletion, continue with IV normal saline.  Hypokalemia - Secondary to the diarrhea, replace with oral supplements.  Hypertension - On the higher side, resume home medication.  Abnormal kidney lesions in the CT abdomen will need further workup as outpatient.   Code Status:  Full  Family Communication: husband at bedside  Disposition Plan: PT/OT   Procedures  None   Consults  None   Medications  Scheduled Meds: . amLODipine  5 mg Oral Daily  . aspirin EC  81 mg Oral Daily  . cycloSPORINE  1 drop Both Eyes BID  . donepezil  5 mg Oral QHS  . enoxaparin (LOVENOX) injection  30 mg Subcutaneous Q24H  . hydrALAZINE  25 mg Oral 3 times per day  . metoprolol tartrate  25 mg Oral BID  . sertraline  50 mg Oral Daily  . vancomycin  125 mg Oral QID  . cyanocobalamin  500 mcg Oral Daily   Continuous Infusions:   PRN Meds:.acetaminophen **OR** acetaminophen, hydrALAZINE, LORazepam, ondansetron **OR** ondansetron (ZOFRAN) IV  DVT Prophylaxis  Lovenox -  Lab Results  Component Value Date   PLT 297 04/06/2015    Antibiotics    Anti-infectives    Start     Dose/Rate Route Frequency Ordered Stop   04/07/15 0600  vancomycin (VANCOCIN) IVPB 1000 mg/200 mL premix  Status:  Discontinued     1,000 mg 200 mL/hr over 60 Minutes Intravenous Every 48 hours 04/05/15 0532 04/05/15 1853   04/05/15 1600  piperacillin-tazobactam (ZOSYN) IVPB 2.25 g  Status:  Discontinued     2.25 g 100 mL/hr over 30 Minutes Intravenous Every 8 hours 04/05/15 1510 04/05/15 1859   04/05/15 1400  piperacillin-tazobactam (ZOSYN) IVPB 2.25 g  Status:  Discontinued     2.25 g 100 mL/hr over 30 Minutes Intravenous Every 8 hours 04/05/15 0530 04/05/15 1016   04/05/15 1100  vancomycin (VANCOCIN) 50 mg/mL oral solution 125 mg     125 mg Oral 4 times daily 04/05/15 1019 04/19/15 0959   04/05/15 0815  metroNIDAZOLE (FLAGYL) IVPB 500 mg  Status:  Discontinued     500 mg 100 mL/hr over 60 Minutes Intravenous Every 8 hours 04/05/15 0801 04/05/15 1610   04/05/15 0600  vancomycin (VANCOCIN) IVPB 1000 mg/200 mL premix     1,000 mg 200 mL/hr over 60 Minutes Intravenous STAT 04/05/15 0531 04/05/15 0812   04/05/15 0530  piperacillin-tazobactam (ZOSYN) IVPB 2.25 g     2.25 g 100 mL/hr over 30  Minutes Intravenous STAT 04/05/15 0523 04/05/15 0712          Objective:   Filed Vitals:   04/05/15 2013 04/06/15 0425 04/06/15 0953 04/06/15 1318  BP: 152/61 141/57 143/58 118/50  Pulse: 77 66 74 65  Temp: 98.7 F (37.1 C) 97.8 F (36.6 C)  97.9 F (36.6 C)  TempSrc: Oral Oral  Oral  Resp: 20 20  22   Height:      Weight:  78.6 kg (173 lb 4.5 oz)    SpO2: 96% 96%  99%    Wt Readings from Last 3 Encounters:  04/06/15 78.6 kg (173 lb 4.5 oz)  03/31/15 79.3 kg (174 lb 13.2 oz)  03/22/15 85.276 kg (188 lb)     Intake/Output Summary (Last 24 hours) at 04/06/15 1646 Last data filed at 04/06/15 2694  Gross per 24 hour  Intake 1451.25 ml  Output      0 ml  Net 1451.25 ml     Physical Exam  Awake Alert, Oriented X 3, frail, No new F.N deficits, Normal affect [place].AT,PERRAL Supple Neck,No JVD, No cervical lymphadenopathy appriciated.  Symmetrical Chest wall movement, Good air movement bilaterally, RRR,No Gallops,Rubs or new Murmurs, No Parasternal Heave +ve B.Sounds, Abd Soft, No tenderness, No organomegaly appriciated, No rebound - guarding or rigidity. No Cyanosis, Clubbing or edema, No new Rash or bruise     Data Review   Micro Results Recent Results (from the past 240 hour(s))  Culture, blood (routine x 2)     Status: None (Preliminary result)   Collection Time: 04/05/15  1:45 AM  Result Value Ref Range Status   Specimen Description BLOOD RIGHT ANTECUBITAL  Final   Special Requests BOTTLES DRAWN AEROBIC AND ANAEROBIC 5CC  Final   Culture   Final           BLOOD CULTURE RECEIVED NO GROWTH TO DATE CULTURE WILL BE HELD FOR 5 DAYS BEFORE ISSUING A FINAL NEGATIVE REPORT Performed at [facility]    Report Status PENDING  Incomplete  Culture, blood (routine x 2)     Status: None (Preliminary result)   Collection Time: 04/05/15  1:48 AM  Result Value Ref Range Status   Specimen Description BLOOD L HAND  Final   Special Requests BOTTLES DRAWN AEROBIC AND  ANAEROBIC 5CC  Final   Culture   Final           BLOOD CULTURE RECEIVED NO GROWTH TO DATE CULTURE WILL BE HELD FOR 5 DAYS BEFORE ISSUING A FINAL NEGATIVE REPORT Performed at [facility]    Report Status PENDING  Incomplete  Clostridium Difficile by PCR     Status: Abnormal   Collection Time: 04/05/15  7:59 AM  Result Value Ref  Range Status   C difficile by pcr POSITIVE (A) NEGATIVE Final    Comment: CRITICAL RESULT CALLED TO, READ BACK BY AND VERIFIED WITH: A. WARD RN AT 1000 ON 04.26.16 BY [facility]     Radiology Reports Ct Abdomen Pelvis Wo Contrast  04/05/2015   CLINICAL DATA:  Nausea, vomiting and diarrhea. Recent hospitalization for similar symptoms. History of cholecystectomy, hysterectomy, colonic polyp, diverticulosis and hepatic cyst.  EXAM: CT ABDOMEN AND PELVIS WITHOUT CONTRAST  TECHNIQUE: Multidetector CT imaging of the abdomen and pelvis was performed following the standard protocol without IV contrast.  COMPARISON:  CT of the abdomen and pelvis March 24, 2015 in CT of the abdomen and pelvis Apr 15, 2007  FINDINGS: Mild to moderate respiratory motion degraded examination.  LUNG BASES: Included view of the lung bases are clear. The visualized heart and pericardium are unremarkable.  KIDNEYS/BLADDER: Kidneys are orthotopic, demonstrating normal size and morphology. No nephrolithiasis, hydronephrosis; limited assessment for renal masses on this nonenhanced examination. 2.5 cm partially calcified mass in RIGHT renal pelvis was present in 2008. Stable 4 cm cyst lower pole of RIGHT kidney, stable 2.8 cm cyst upper pole of LEFT kidney. Exophytic LEFT upper pole 3.2 x 2.6 cm mass again seen, 26 Hounsfield units, relatively stable from 2008. The unopacified ureters are normal in course and caliber. Urinary bladder is partially distended and unremarkable.  SOLID ORGANS: The spleen, pancreas and adrenal glands are unremarkable for this non-contrast examination. Multiple low-density cysts  throughout the liver, largest in RIGHT lobe measuring 9.5 x 11.6 cm, 3 Hounsfield units, unchanged. Status post cholecystectomy.  GASTROINTESTINAL TRACT: Enteric contrast has not yet reached the distal small bowel. Small to moderate hiatal hernia. Severe sigmoid diverticulosis, with mild residual inflammatory changes. The stomach, small bowel are normal in course and caliber without inflammatory changes, the sensitivity may be decreased by lack of enteric contrast. Fluid within the colon, rectal air-fluid level. The appendix is not discretely identified, however there are no inflammatory changes in the right lower quadrant.  PERITONEUM/RETROPERITONEUM: Aortoiliac vessels are normal in course and caliber fell moderate to severe calcific atherosclerosis. No lymphadenopathy by CT size criteria. Status post cholecystectomy. No intraperitoneal free fluid nor free air.  SOFT TISSUES/ OSSEOUS STRUCTURES:  Nonsuspicious.  IMPRESSION: Motion degraded examination. Improving uncomplicated sigmoid diverticulitis.  Indeterminate renal lesions bilaterally, including partially calcified RIGHT renal pelvis lesion for which follow-up MRI is recommended with contrast on a nonemergent basis. This recommendation follows ACR consensus guidelines: Managing Incidental Findings on Abdominal CT: White Paper of the ACR Incidental Findings Committee. J Am Coll Radiol 2010;7:754-773  Stable hepatic cysts.  Status post cholecystectomy.   Electronically Signed   By: Elon Alas   On: 04/05/2015 05:20   Dg Chest Port 1 View  04/05/2015   CLINICAL DATA:  81 year old female with fever, nausea, vomiting and diarrhea.  EXAM: PORTABLE CHEST - 1 VIEW  COMPARISON:  Recent prior chest x-ray 03/28/2015  FINDINGS: Stable cardiac and mediastinal contours. Mild central bronchitic change and interstitial prominence. No pulmonary edema, pleural effusion, focal airspace consolidation or pneumothorax. No acute osseous abnormality.  IMPRESSION: No active  cardiopulmonary process.   Electronically Signed   By: Jacqulynn Cadet M.D.   On: 04/05/2015 03:00    CBC  Recent Labs Lab 04/05/15 0148 04/05/15 0820 04/06/15 0158  WBC 20.8* 17.3* 14.2*  HGB 12.4 10.7* 10.4*  HCT 36.0 31.6* 30.7*  PLT 367 334 297  MCV 88.9 88.8 88.7  MCH 30.6 30.1 30.1  MCHC  34.4 33.9 33.9  RDW 13.2 13.3 13.3  LYMPHSABS 0.5* 0.7  --   MONOABS 1.5* 0.8  --   EOSABS 0.1 0.0  --   BASOSABS 0.0 0.0  --     Chemistries   Recent Labs Lab 04/01/15 0355 04/05/15 0148 04/05/15 0820 04/06/15 0145 04/06/15 0817  NA 132* 128* 130* 133* 133*  K 3.5 3.0* 3.0* 2.7* 3.3*  CL 92* 97 99 103 106  CO2 26 20 18* 21 20  GLUCOSE 116* 144* 173* 125* 121*  BUN 42* 43* 35* 31* 30*  CREATININE 5.13* 3.38* 3.13* 2.77* 2.62*  CALCIUM 8.6 8.9 8.1* 8.0* 8.0*  MG  --   --   --  1.5  --   AST  --  16 17  --   --   ALT  --  16 13  --   --   ALKPHOS  --  86 74  --   --   BILITOT  --  1.1 0.9  --   --    ------------------------------------------------------------------------------------------------------------------ estimated creatinine clearance is 18 mL/min (by C-G formula based on Cr of 2.62). ------------------------------------------------------------------------------------------------------------------ No results for input(s): HGBA1C in the last 72 hours. ------------------------------------------------------------------------------------------------------------------ No results for input(s): CHOL, HDL, LDLCALC, TRIG, CHOLHDL, LDLDIRECT in the last 72 hours. ------------------------------------------------------------------------------------------------------------------ No results for input(s): TSH, T4TOTAL, T3FREE, THYROIDAB in the last 72 hours.  Invalid input(s): FREET3 ------------------------------------------------------------------------------------------------------------------ No results for input(s): VITAMINB12, FOLATE, FERRITIN, TIBC, IRON, RETICCTPCT in  the last 72 hours.  Coagulation profile  Recent Labs Lab 04/05/15 0820  INR 1.20    No results for input(s): DDIMER in the last 72 hours.  Cardiac Enzymes  Recent Labs Lab 04/05/15 0820  TROPONINI 0.04*   ------------------------------------------------------------------------------------------------------------------ Invalid input(s): POCBNP     Time Spent in minutes   30 minutes    Jaser Fullen A M.D on 04/06/2015 at 4:46 PM  Between 7am to 7pm - Pager - [phone]  After 7pm go to [email or web address] - password [email or web address]  And look for the night coverage person covering for me after hours  [facility] Office  [phone]   **Disclaimer: This note may have been dictated with voice recognition software. Similar sounding words can inadvertently be transcribed and this note may contain transcription errors which may not have been corrected upon publication of note.**

## 2015-04-06 NOTE — Progress Notes (Signed)
CRITICAL VALUE ALERT  Critical value received:  Potassium 2.7  Date of notification:  04/06/2015  Time of notification:  0315  Critical value read back:Yes.    Nurse who received alert:  L. Vergel de Larkin Ina RN  MD notified (1st page):  Hal Hope MD  Time of first page:  [phone]  MD notified (2nd page):  Time of second page:  Responding MD:  Hal Hope MD  Time MD responded:  [phone]

## 2015-04-06 NOTE — Progress Notes (Signed)
Patient just had seem to be runs of v.tach on the monitor, she was resting and asymptomatic. Dr. Hal Hope was made aware, ordered stat labs. We will continue to monitor patient.

## 2015-04-07 LAB — BASIC METABOLIC PANEL
Anion gap: 6 (ref 5–15)
BUN: 22 mg/dL (ref 6–23)
CHLORIDE: 110 mmol/L (ref 96–112)
CO2: 20 mmol/L (ref 19–32)
Calcium: 8 mg/dL — ABNORMAL LOW (ref 8.4–10.5)
Creatinine, Ser: 2.01 mg/dL — ABNORMAL HIGH (ref 0.50–1.10)
GFR calc Af Amer: 26 mL/min — ABNORMAL LOW (ref >90)
GFR, EST NON AFRICAN AMERICAN: 22 mL/min — AB (ref >90)
GLUCOSE: 114 mg/dL — AB (ref 70–99)
POTASSIUM: 3.3 mmol/L — AB (ref 3.5–5.1)
SODIUM: 136 mmol/L (ref 135–145)

## 2015-04-07 LAB — GLUCOSE, CAPILLARY
GLUCOSE-CAPILLARY: 104 mg/dL — AB (ref 70–99)
Glucose-Capillary: 102 mg/dL — ABNORMAL HIGH (ref 70–99)
Glucose-Capillary: 105 mg/dL — ABNORMAL HIGH (ref 70–99)
Glucose-Capillary: 115 mg/dL — ABNORMAL HIGH (ref 70–99)
Glucose-Capillary: 92 mg/dL (ref 70–99)
Glucose-Capillary: 95 mg/dL (ref 70–99)

## 2015-04-07 LAB — GI PATHOGEN PANEL BY PCR, STOOL
CAMPYLOBACTER BY PCR: NOT DETECTED
Cryptosporidium by PCR: NOT DETECTED
E COLI (ETEC) LT/ST: NOT DETECTED
E COLI (STEC): NOT DETECTED
E COLI 0157 BY PCR: NOT DETECTED
G lamblia by PCR: NOT DETECTED
Norovirus GI/GII: NOT DETECTED
Rotavirus A by PCR: NOT DETECTED
Salmonella by PCR: NOT DETECTED
Shigella by PCR: NOT DETECTED

## 2015-04-07 MED ORDER — POTASSIUM CHLORIDE CRYS ER 10 MEQ PO TBCR
40.0000 meq | EXTENDED_RELEASE_TABLET | Freq: Four times a day (QID) | ORAL | Status: AC
  Administered 2015-04-07 (×2): 40 meq via ORAL
  Filled 2015-04-07 (×2): qty 4

## 2015-04-07 MED ORDER — SACCHAROMYCES BOULARDII 250 MG PO CAPS
250.0000 mg | ORAL_CAPSULE | Freq: Two times a day (BID) | ORAL | Status: DC
  Administered 2015-04-07 – 2015-04-10 (×7): 250 mg via ORAL
  Filled 2015-04-07 (×7): qty 1

## 2015-04-07 MED ORDER — MAGNESIUM SULFATE 2 GM/50ML IV SOLN
2.0000 g | Freq: Once | INTRAVENOUS | Status: AC
  Administered 2015-04-07: 2 g via INTRAVENOUS
  Filled 2015-04-07: qty 50

## 2015-04-07 NOTE — Progress Notes (Signed)
OT Cancellation Note  Patient Details Name: Toni [name] MRN: [id number] DOB: [birth year]   Cancelled Treatment:    Reason Eval/Treat Not Completed: OT screened, no needs identified, will sign off Pt states no OT needs. Will sign off  [place], Thereasa Parkin 04/07/2015, 10:09 AM

## 2015-04-07 NOTE — Progress Notes (Signed)
Patient Demographics  Toni [name], is a 81 y.o. female, DOB - 1934-08-04, TDS:[id number]  Admit date - 04/04/2015   Admitting Physician Modena Jansky, MD  Outpatient Primary MD for the patient is Unice Cobble, MD  LOS - 2   Chief Complaint  Patient presents with  . Emesis  . Diarrhea        Subjective:   Less bowel movements per husband and patient, feels much better. Advance to regular food.  Assessment & Plan    Active Problems:   Essential hypertension   Hyperlipidemia   Acute renal failure   Sepsis   Nausea vomiting and diarrhea   Diastolic CHF   C. difficile diarrhea   sepsis secondary to C. Difficile - With recent lengthy hospitalization with antibiotic use for diverticulitis, since with nausea, vomiting and diarrhea, stool positive for C. Difficile. - Sepsis criteria on admission giving tachypnea, tachycardia and leukocytosis. - Stopped IV antibiotics, did on oral vancomycin, IV Flagyl discontinued. - Lactic acid normalized, continue gentle IV fluids hydration, follow renal function closely. - Improving since yesterday, tolerated clear liquids without problems, advanced to regular food.  Acute renal failure - Appears to be improving, baseline creatinine is less than 1, recently discharged with a creatinine of 5, overall creatinine appears to be trending down, continue with gentle hydration.  Diastolic CHF - Last EF 26-20% with a grade 1 diastolic dysfunction . - Patient appears to be dehydrated and hypovolemic, and chin you with gentle hydration.  Hyponatremia - Secondary to volume depletion, continue with IV normal saline.  Hypokalemia - Secondary to the diarrhea, replace with oral supplements. Check BMP in a.m.  Hypertension - On the higher side, resume home medication.  Abnormal kidney lesions in the CT abdomen will need  further workup as outpatient.   Code Status: Full  Family Communication: husband at bedside  Disposition Plan: PT/OT   Procedures  None   Consults  None   Medications  Scheduled Meds: . amLODipine  5 mg Oral Daily  . aspirin EC  81 mg Oral Daily  . cycloSPORINE  1 drop Both Eyes BID  . donepezil  5 mg Oral QHS  . enoxaparin (LOVENOX) injection  30 mg Subcutaneous Q24H  . hydrALAZINE  25 mg Oral 3 times per day  . metoprolol tartrate  25 mg Oral BID  . sertraline  50 mg Oral Daily  . vancomycin  125 mg Oral QID  . cyanocobalamin  500 mcg Oral Daily   Continuous Infusions: . 0.9 % NaCl with KCl 40 mEq / L 75 mL/hr (04/07/15 1010)   PRN Meds:.acetaminophen **OR** acetaminophen, hydrALAZINE, LORazepam, ondansetron **OR** ondansetron (ZOFRAN) IV  DVT Prophylaxis  Lovenox -  Lab Results  Component Value Date   PLT 297 04/06/2015    Antibiotics    Anti-infectives    Start     Dose/Rate Route Frequency Ordered Stop   04/07/15 0600  vancomycin (VANCOCIN) IVPB 1000 mg/200 mL premix  Status:  Discontinued     1,000 mg 200 mL/hr over 60 Minutes Intravenous Every 48 hours 04/05/15 0532 04/05/15 1853   04/05/15 1600  piperacillin-tazobactam (ZOSYN) IVPB 2.25 g  Status:  Discontinued     2.25 g 100 mL/hr over 30 Minutes Intravenous  Every 8 hours 04/05/15 1510 04/05/15 1859   04/05/15 1400  piperacillin-tazobactam (ZOSYN) IVPB 2.25 g  Status:  Discontinued     2.25 g 100 mL/hr over 30 Minutes Intravenous Every 8 hours 04/05/15 0530 04/05/15 1016   04/05/15 1100  vancomycin (VANCOCIN) 50 mg/mL oral solution 125 mg     125 mg Oral 4 times daily 04/05/15 1019 04/19/15 0959   04/05/15 0815  metroNIDAZOLE (FLAGYL) IVPB 500 mg  Status:  Discontinued     500 mg 100 mL/hr over 60 Minutes Intravenous Every 8 hours 04/05/15 0801 04/05/15 1610   04/05/15 0600  vancomycin (VANCOCIN) IVPB 1000 mg/200 mL premix     1,000 mg 200 mL/hr over 60 Minutes Intravenous STAT 04/05/15 0531  04/05/15 0812   04/05/15 0530  piperacillin-tazobactam (ZOSYN) IVPB 2.25 g     2.25 g 100 mL/hr over 30 Minutes Intravenous STAT 04/05/15 0523 04/05/15 0712          Objective:   Filed Vitals:   04/06/15 2027 04/06/15 2153 04/07/15 0400 04/07/15 1003  BP: 167/55 143/61 146/67 140/60  Pulse: 71 60 66 64  Temp: 97.9 F (36.6 C)  97.9 F (36.6 C)   TempSrc: Oral  Oral   Resp: 20  19   Height:      Weight:      SpO2: 100%  100%     Wt Readings from Last 3 Encounters:  04/06/15 78.6 kg (173 lb 4.5 oz)  03/31/15 79.3 kg (174 lb 13.2 oz)  03/22/15 85.276 kg (188 lb)     Intake/Output Summary (Last 24 hours) at 04/07/15 1150 Last data filed at 04/07/15 0700  Gross per 24 hour  Intake   1020 ml  Output      0 ml  Net   1020 ml     Physical Exam  Awake Alert, Oriented X 3, frail, No new F.N deficits, Normal affect [employer].AT,PERRAL Supple Neck,No JVD, No cervical lymphadenopathy appriciated.  Symmetrical Chest wall movement, Good air movement bilaterally, RRR,No Gallops,Rubs or new Murmurs, No Parasternal Heave +ve B.Sounds, Abd Soft, No tenderness, No organomegaly appriciated, No rebound - guarding or rigidity. No Cyanosis, Clubbing or edema, No new Rash or bruise     Data Review   Micro Results Recent Results (from the past 240 hour(s))  Culture, blood (routine x 2)     Status: None (Preliminary result)   Collection Time: 04/05/15  1:45 AM  Result Value Ref Range Status   Specimen Description BLOOD RIGHT ANTECUBITAL  Final   Special Requests BOTTLES DRAWN AEROBIC AND ANAEROBIC 5CC  Final   Culture   Final           BLOOD CULTURE RECEIVED NO GROWTH TO DATE CULTURE WILL BE HELD FOR 5 DAYS BEFORE ISSUING A FINAL NEGATIVE REPORT Performed at [facility]    Report Status PENDING  Incomplete  Culture, blood (routine x 2)     Status: None (Preliminary result)   Collection Time: 04/05/15  1:48 AM  Result Value Ref Range Status   Specimen Description BLOOD L  HAND  Final   Special Requests BOTTLES DRAWN AEROBIC AND ANAEROBIC 5CC  Final   Culture   Final           BLOOD CULTURE RECEIVED NO GROWTH TO DATE CULTURE WILL BE HELD FOR 5 DAYS BEFORE ISSUING A FINAL NEGATIVE REPORT Performed at [facility]    Report Status PENDING  Incomplete  Clostridium Difficile by PCR  Status: Abnormal   Collection Time: 04/05/15  7:59 AM  Result Value Ref Range Status   C difficile by pcr POSITIVE (A) NEGATIVE Final    Comment: CRITICAL RESULT CALLED TO, READ BACK BY AND VERIFIED WITH: A. WARD RN AT 1000 ON 04.26.16 BY [facility]     Radiology Reports No results found.  CBC  Recent Labs Lab 04/05/15 0148 04/05/15 0820 04/06/15 0158  WBC 20.8* 17.3* 14.2*  HGB 12.4 10.7* 10.4*  HCT 36.0 31.6* 30.7*  PLT 367 334 297  MCV 88.9 88.8 88.7  MCH 30.6 30.1 30.1  MCHC 34.4 33.9 33.9  RDW 13.2 13.3 13.3  LYMPHSABS 0.5* 0.7  --   MONOABS 1.5* 0.8  --   EOSABS 0.1 0.0  --   BASOSABS 0.0 0.0  --     Chemistries   Recent Labs Lab 04/05/15 0148 04/05/15 0820 04/06/15 0145 04/06/15 0817 04/07/15 1020  NA 128* 130* 133* 133* 136  K 3.0* 3.0* 2.7* 3.3* 3.3*  CL 97 99 103 106 110  CO2 20 18* 21 20 20   GLUCOSE 144* 173* 125* 121* 114*  BUN 43* 35* 31* 30* 22  CREATININE 3.38* 3.13* 2.77* 2.62* 2.01*  CALCIUM 8.9 8.1* 8.0* 8.0* 8.0*  MG  --   --  1.5  --   --   AST 16 17  --   --   --   ALT 16 13  --   --   --   ALKPHOS 86 74  --   --   --   BILITOT 1.1 0.9  --   --   --    ------------------------------------------------------------------------------------------------------------------ estimated creatinine clearance is 23.5 mL/min (by C-G formula based on Cr of 2.01). ------------------------------------------------------------------------------------------------------------------ No results for input(s): HGBA1C in the last 72  hours. ------------------------------------------------------------------------------------------------------------------ No results for input(s): CHOL, HDL, LDLCALC, TRIG, CHOLHDL, LDLDIRECT in the last 72 hours. ------------------------------------------------------------------------------------------------------------------ No results for input(s): TSH, T4TOTAL, T3FREE, THYROIDAB in the last 72 hours.  Invalid input(s): FREET3 ------------------------------------------------------------------------------------------------------------------ No results for input(s): VITAMINB12, FOLATE, FERRITIN, TIBC, IRON, RETICCTPCT in the last 72 hours.  Coagulation profile  Recent Labs Lab 04/05/15 0820  INR 1.20    No results for input(s): DDIMER in the last 72 hours.  Cardiac Enzymes  Recent Labs Lab 04/05/15 0820  TROPONINI 0.04*   ------------------------------------------------------------------------------------------------------------------ Invalid input(s): POCBNP     Time Spent in minutes   30 minutes    Daquavion Catala A M.D on 04/07/2015 at 11:50 AM  Between 7am to 7pm - Pager - [phone]  After 7pm go to [email or web address] - password [email or web address]  And look for the night coverage person covering for me after hours  [facility] Office  [phone]   **Disclaimer: This note may have been dictated with voice recognition software. Similar sounding words can inadvertently be transcribed and this note may contain transcription errors which may not have been corrected upon publication of note.**

## 2015-04-08 LAB — GLUCOSE, CAPILLARY
GLUCOSE-CAPILLARY: 99 mg/dL (ref 70–99)
GLUCOSE-CAPILLARY: 138 mg/dL — AB (ref 70–99)
GLUCOSE-CAPILLARY: 115 mg/dL — AB (ref 70–99)
Glucose-Capillary: 107 mg/dL — ABNORMAL HIGH (ref 70–99)
Glucose-Capillary: 109 mg/dL — ABNORMAL HIGH (ref 70–99)

## 2015-04-08 LAB — BASIC METABOLIC PANEL
Anion gap: 6 (ref 5–15)
BUN: 17 mg/dL (ref 6–23)
CALCIUM: 8.4 mg/dL (ref 8.4–10.5)
CO2: 19 mmol/L (ref 19–32)
Chloride: 115 mmol/L — ABNORMAL HIGH (ref 96–112)
Creatinine, Ser: 1.68 mg/dL — ABNORMAL HIGH (ref 0.50–1.10)
GFR calc Af Amer: 32 mL/min — ABNORMAL LOW (ref >90)
GFR, EST NON AFRICAN AMERICAN: 28 mL/min — AB (ref >90)
Glucose, Bld: 103 mg/dL — ABNORMAL HIGH (ref 70–99)
Potassium: 4.2 mmol/L (ref 3.5–5.1)
Sodium: 140 mmol/L (ref 135–145)

## 2015-04-08 LAB — MAGNESIUM: Magnesium: 1.9 mg/dL (ref 1.5–2.5)

## 2015-04-08 MED ORDER — HEPARIN SODIUM (PORCINE) 5000 UNIT/ML IJ SOLN
5000.0000 [IU] | Freq: Three times a day (TID) | INTRAMUSCULAR | Status: DC
  Administered 2015-04-08 – 2015-04-10 (×6): 5000 [IU] via SUBCUTANEOUS
  Filled 2015-04-08 (×6): qty 1

## 2015-04-08 NOTE — Progress Notes (Signed)
Patient Demographics  Toni [name], is a 81 y.o. female, DOB - 1934-06-30, QBV:[id number]  Admit date - 04/04/2015   Admitting Physician Modena Jansky, MD  Outpatient Primary MD for the patient is Unice Cobble, MD  LOS - 3   Chief Complaint  Patient presents with  . Emesis  . Diarrhea        Subjective:   Feels much better today, still has significant diarrhea, watery but no nausea or vomiting.  Assessment & Plan    Active Problems:   Essential hypertension   Hyperlipidemia   Acute renal failure   Sepsis   Nausea vomiting and diarrhea   Diastolic CHF   C. difficile diarrhea  Sepsis secondary to C. Difficile - With recent lengthy hospitalization with antibiotic use for diverticulitis, since with nausea, vomiting and diarrhea, stool positive for C. Difficile. - Sepsis criteria on admission giving tachypnea, tachycardia and leukocytosis. - Stopped IV antibiotics, did on oral vancomycin, IV Flagyl discontinued. - Lactic acid normalized, continue gentle IV fluids hydration, follow renal function closely. - Feels better since yesterday, advanced regular food, started on Florastor as well.  Acute renal failure - Recently creatinine was 7.1, likely ATN. - Creatinine improving with IV fluids, 1.6 yesterday, will discontinue IV fluids started late patient today.  Diastolic CHF - Last EF 28-00% with a grade 1 diastolic dysfunction . - Patient appears to be dehydrated and hypovolemic, discontinue IV fluids still euvolemic.  Hyponatremia - Secondary to volume depletion, continue with IV normal saline.  Hypokalemia - Secondary to the diarrhea, replace with oral supplements. Check BMP in a.m.  Hypertension - On the higher side, resume home medication.  Abnormal kidney lesions in the CT abdomen will need further workup as outpatient.   Code  Status: Full  Family Communication: husband at bedside  Disposition Plan: PT/OT   Procedures  None   Consults  None   Medications  Scheduled Meds: . amLODipine  5 mg Oral Daily  . aspirin EC  81 mg Oral Daily  . cycloSPORINE  1 drop Both Eyes BID  . donepezil  5 mg Oral QHS  . enoxaparin (LOVENOX) injection  30 mg Subcutaneous Q24H  . hydrALAZINE  25 mg Oral 3 times per day  . metoprolol tartrate  25 mg Oral BID  . saccharomyces boulardii  250 mg Oral BID  . sertraline  50 mg Oral Daily  . vancomycin  125 mg Oral QID  . cyanocobalamin  500 mcg Oral Daily   Continuous Infusions:   PRN Meds:.acetaminophen **OR** acetaminophen, hydrALAZINE, LORazepam, ondansetron **OR** ondansetron (ZOFRAN) IV  DVT Prophylaxis  Lovenox -  Lab Results  Component Value Date   PLT 297 04/06/2015    Antibiotics    Anti-infectives    Start     Dose/Rate Route Frequency Ordered Stop   04/07/15 0600  vancomycin (VANCOCIN) IVPB 1000 mg/200 mL premix  Status:  Discontinued     1,000 mg 200 mL/hr over 60 Minutes Intravenous Every 48 hours 04/05/15 0532 04/05/15 1853   04/05/15 1600  piperacillin-tazobactam (ZOSYN) IVPB 2.25 g  Status:  Discontinued     2.25 g 100 mL/hr over 30 Minutes Intravenous Every 8 hours 04/05/15 1510 04/05/15 1859   04/05/15 1400  piperacillin-tazobactam (  ZOSYN) IVPB 2.25 g  Status:  Discontinued     2.25 g 100 mL/hr over 30 Minutes Intravenous Every 8 hours 04/05/15 0530 04/05/15 1016   04/05/15 1100  vancomycin (VANCOCIN) 50 mg/mL oral solution 125 mg     125 mg Oral 4 times daily 04/05/15 1019 04/19/15 0959   04/05/15 0815  metroNIDAZOLE (FLAGYL) IVPB 500 mg  Status:  Discontinued     500 mg 100 mL/hr over 60 Minutes Intravenous Every 8 hours 04/05/15 0801 04/05/15 1610   04/05/15 0600  vancomycin (VANCOCIN) IVPB 1000 mg/200 mL premix     1,000 mg 200 mL/hr over 60 Minutes Intravenous STAT 04/05/15 0531 04/05/15 0812   04/05/15 0530  piperacillin-tazobactam  (ZOSYN) IVPB 2.25 g     2.25 g 100 mL/hr over 30 Minutes Intravenous STAT 04/05/15 0523 04/05/15 0712          Objective:   Filed Vitals:   04/07/15 1434 04/07/15 1435 04/07/15 2110 04/08/15 0424  BP: 151/50 151/56 148/50 132/53  Pulse:  19 70 65  Temp:  97.8 F (36.6 C) 98.6 F (37 C) 98.7 F (37.1 C)  TempSrc:  Oral Oral Oral  Resp:  20 20 18   Height:      Weight:    78.2 kg (172 lb 6.4 oz)  SpO2:  100% 98% 98%    Wt Readings from Last 3 Encounters:  04/08/15 78.2 kg (172 lb 6.4 oz)  03/31/15 79.3 kg (174 lb 13.2 oz)  03/22/15 85.276 kg (188 lb)     Intake/Output Summary (Last 24 hours) at 04/08/15 1149 Last data filed at 04/08/15 0758  Gross per 24 hour  Intake    530 ml  Output      0 ml  Net    530 ml     Physical Exam  Awake Alert, Oriented X 3, frail, No new F.N deficits, Normal affect [place].AT,PERRAL Supple Neck,No JVD, No cervical lymphadenopathy appriciated.  Symmetrical Chest wall movement, Good air movement bilaterally, RRR,No Gallops,Rubs or new Murmurs, No Parasternal Heave +ve B.Sounds, Abd Soft, No tenderness, No organomegaly appriciated, No rebound - guarding or rigidity. No Cyanosis, Clubbing or edema, No new Rash or bruise     Data Review   Micro Results Recent Results (from the past 240 hour(s))  Culture, blood (routine x 2)     Status: None (Preliminary result)   Collection Time: 04/05/15  1:45 AM  Result Value Ref Range Status   Specimen Description BLOOD RIGHT ANTECUBITAL  Final   Special Requests BOTTLES DRAWN AEROBIC AND ANAEROBIC 5CC  Final   Culture   Final           BLOOD CULTURE RECEIVED NO GROWTH TO DATE CULTURE WILL BE HELD FOR 5 DAYS BEFORE ISSUING A FINAL NEGATIVE REPORT Performed at [facility]    Report Status PENDING  Incomplete  Culture, blood (routine x 2)     Status: None (Preliminary result)   Collection Time: 04/05/15  1:48 AM  Result Value Ref Range Status   Specimen Description BLOOD L HAND  Final     Special Requests BOTTLES DRAWN AEROBIC AND ANAEROBIC 5CC  Final   Culture   Final           BLOOD CULTURE RECEIVED NO GROWTH TO DATE CULTURE WILL BE HELD FOR 5 DAYS BEFORE ISSUING A FINAL NEGATIVE REPORT Performed at [facility]    Report Status PENDING  Incomplete  Clostridium Difficile by PCR  Status: Abnormal   Collection Time: 04/05/15  7:59 AM  Result Value Ref Range Status   C difficile by pcr POSITIVE (A) NEGATIVE Final    Comment: CRITICAL RESULT CALLED TO, READ BACK BY AND VERIFIED WITH: A. WARD RN AT 1000 ON 04.26.16 BY [facility]     Radiology Reports No results found.  CBC  Recent Labs Lab 04/05/15 0148 04/05/15 0820 04/06/15 0158  WBC 20.8* 17.3* 14.2*  HGB 12.4 10.7* 10.4*  HCT 36.0 31.6* 30.7*  PLT 367 334 297  MCV 88.9 88.8 88.7  MCH 30.6 30.1 30.1  MCHC 34.4 33.9 33.9  RDW 13.2 13.3 13.3  LYMPHSABS 0.5* 0.7  --   MONOABS 1.5* 0.8  --   EOSABS 0.1 0.0  --   BASOSABS 0.0 0.0  --     Chemistries   Recent Labs Lab 04/05/15 0148 04/05/15 0820 04/06/15 0145 04/06/15 0817 04/07/15 1020 04/08/15 0525  NA 128* 130* 133* 133* 136 140  K 3.0* 3.0* 2.7* 3.3* 3.3* 4.2  CL 97 99 103 106 110 115*  CO2 20 18* 21 20 20 19   GLUCOSE 144* 173* 125* 121* 114* 103*  BUN 43* 35* 31* 30* 22 17  CREATININE 3.38* 3.13* 2.77* 2.62* 2.01* 1.68*  CALCIUM 8.9 8.1* 8.0* 8.0* 8.0* 8.4  MG  --   --  1.5  --   --  1.9  AST 16 17  --   --   --   --   ALT 16 13  --   --   --   --   ALKPHOS 86 74  --   --   --   --   BILITOT 1.1 0.9  --   --   --   --    ------------------------------------------------------------------------------------------------------------------ estimated creatinine clearance is 28.1 mL/min (by C-G formula based on Cr of 1.68). ------------------------------------------------------------------------------------------------------------------ No results for input(s): HGBA1C in the last 72  hours. ------------------------------------------------------------------------------------------------------------------ No results for input(s): CHOL, HDL, LDLCALC, TRIG, CHOLHDL, LDLDIRECT in the last 72 hours. ------------------------------------------------------------------------------------------------------------------ No results for input(s): TSH, T4TOTAL, T3FREE, THYROIDAB in the last 72 hours.  Invalid input(s): FREET3 ------------------------------------------------------------------------------------------------------------------ No results for input(s): VITAMINB12, FOLATE, FERRITIN, TIBC, IRON, RETICCTPCT in the last 72 hours.  Coagulation profile  Recent Labs Lab 04/05/15 0820  INR 1.20    No results for input(s): DDIMER in the last 72 hours.  Cardiac Enzymes  Recent Labs Lab 04/05/15 0820  TROPONINI 0.04*   ------------------------------------------------------------------------------------------------------------------ Invalid input(s): POCBNP     Time Spent in minutes   30 minutes    Tahlor Berenguer A M.D on 04/08/2015 at 11:49 AM  Between 7am to 7pm - Pager - [phone]  After 7pm go to [email or web address] - password [email or web address]  And look for the night coverage person covering for me after hours  [facility] Office  [phone]   **Disclaimer: This note may have been dictated with voice recognition software. Similar sounding words can inadvertently be transcribed and this note may contain transcription errors which may not have been corrected upon publication of note.**

## 2015-04-08 NOTE — Evaluation (Signed)
Physical Therapy Evaluation Patient Details Name: CAM HARNDEN MRN: [id number] DOB: 1934-04-21 Today's Date: 04/08/2015   History of Present Illness  81 yo female admitted with sepsis, diarrhea.  Clinical Impression  On eval, pt was Min guard assist for mobility-able to ambulate ~150 feet in hallway. Slightly unsteady at times but no overt LOB. Mod encouragement for pt to participate/ambulate. Encouraged pt to get up often and walk, at least in rooms, but preferably in hallway with supervision. Pt does not feel she needs  PT however will at least keep her on caseload during hospital stay.     Follow Up Recommendations Supervision for mobility/OOB    Equipment Recommendations  None recommended by PT    Recommendations for Other Services       Precautions / Restrictions Precautions Precautions: Fall Restrictions Weight Bearing Restrictions: No      Mobility  Bed Mobility Overal bed mobility: Modified Independent                Transfers Overall transfer level: Modified independent                  Ambulation/Gait Ambulation/Gait assistance: Min guard Ambulation Distance (Feet): 150 Feet Assistive device: None Gait Pattern/deviations: Step-through pattern;Drifts right/left     General Gait Details: close guard for safety. Pt with difficulty maintaining straight path-not new per pt. Slighly unsteady but no overt LOB.   Stairs            Wheelchair Mobility    Modified Rankin (Stroke Patients Only)       Balance Overall balance assessment: Needs assistance         Standing balance support: No upper extremity supported;During functional activity Standing balance-Leahy Scale: Good                               Pertinent Vitals/Pain Pain Assessment: No/denies pain    Home Living Family/patient expects to be discharged to:: Private residence Living Arrangements: Spouse/significant other Available Help at Discharge:  Family;Available 24 hours/day Type of Home: House Home Access: Stairs to enter Entrance Stairs-Rails: Right Entrance Stairs-Number of Steps: 2-3 Home Layout: One level Home Equipment: None      Prior Function Level of Independence: Independent               Hand Dominance        Extremity/Trunk Assessment   Upper Extremity Assessment: Overall WFL for tasks assessed           Lower Extremity Assessment: Generalized weakness      Cervical / Trunk Assessment: Normal  Communication   Communication: No difficulties  Cognition Arousal/Alertness: Awake/alert Behavior During Therapy: WFL for tasks assessed/performed Overall Cognitive Status: Within Functional Limits for tasks assessed                      General Comments      Exercises        Assessment/Plan    PT Assessment Patient needs continued PT services  PT Diagnosis Difficulty walking;Generalized weakness   PT Problem List Decreased strength;Decreased activity tolerance;Decreased balance;Decreased mobility  PT Treatment Interventions Gait training;Functional mobility training;Therapeutic activities;Therapeutic exercise;Patient/family education;Balance training   PT Goals (Current goals can be found in the Care Plan section) Acute Rehab PT Goals Patient Stated Goal: To go home PT Goal Formulation: With patient/family Time For Goal Achievement: 04/22/15 Potential to Achieve Goals: Good    Frequency Min  2X/week   Barriers to discharge        Co-evaluation               End of Session   Activity Tolerance: Patient tolerated treatment well Patient left: in bed;with call bell/phone within reach;with family/visitor present           Time: 1345-1406 PT Time Calculation (min) (ACUTE ONLY): 21 min   Charges:   PT Evaluation $Initial PT Evaluation Tier I: 1 Procedure     PT G Codes:        Weston Anna, MPT Pager: [phone]

## 2015-04-09 LAB — CBC
HCT: 30.8 % — ABNORMAL LOW (ref 36.0–46.0)
HEMOGLOBIN: 10.3 g/dL — AB (ref 12.0–15.0)
MCH: 29.9 pg (ref 26.0–34.0)
MCHC: 33.4 g/dL (ref 30.0–36.0)
MCV: 89.5 fL (ref 78.0–100.0)
Platelets: 316 10*3/uL (ref 150–400)
RBC: 3.44 MIL/uL — ABNORMAL LOW (ref 3.87–5.11)
RDW: 13.2 % (ref 11.5–15.5)
WBC: 9 10*3/uL (ref 4.0–10.5)

## 2015-04-09 LAB — GLUCOSE, CAPILLARY
GLUCOSE-CAPILLARY: 94 mg/dL (ref 70–99)
GLUCOSE-CAPILLARY: 104 mg/dL — AB (ref 70–99)
Glucose-Capillary: 103 mg/dL — ABNORMAL HIGH (ref 70–99)
Glucose-Capillary: 107 mg/dL — ABNORMAL HIGH (ref 70–99)

## 2015-04-09 LAB — BASIC METABOLIC PANEL
Anion gap: 8 (ref 5–15)
BUN: 12 mg/dL (ref 6–23)
CO2: 19 mmol/L (ref 19–32)
Calcium: 8.4 mg/dL (ref 8.4–10.5)
Chloride: 111 mmol/L (ref 96–112)
Creatinine, Ser: 1.52 mg/dL — ABNORMAL HIGH (ref 0.50–1.10)
GFR, EST AFRICAN AMERICAN: 36 mL/min — AB (ref >90)
GFR, EST NON AFRICAN AMERICAN: 31 mL/min — AB (ref >90)
Glucose, Bld: 97 mg/dL (ref 70–99)
POTASSIUM: 3.6 mmol/L (ref 3.5–5.1)
Sodium: 138 mmol/L (ref 135–145)

## 2015-04-09 MED ORDER — POTASSIUM CHLORIDE CRYS ER 20 MEQ PO TBCR
40.0000 meq | EXTENDED_RELEASE_TABLET | Freq: Four times a day (QID) | ORAL | Status: AC
  Administered 2015-04-09 (×2): 40 meq via ORAL
  Filled 2015-04-09 (×2): qty 2

## 2015-04-09 NOTE — Progress Notes (Signed)
Patient Demographics  Toni [name], is a 81 y.o. female, DOB - 06-04-1934, TIR:[id number]  Admit date - 04/04/2015   Admitting Physician Modena Jansky, MD  Outpatient Primary MD for the patient is Unice Cobble, MD  LOS - 4   Chief Complaint  Patient presents with  . Emesis  . Diarrhea        Subjective:   Seen with husband at bedside, daughter also bedside identified herself as a doctor of psychology. Still has diarrhea, overall feeling better but still very weak, continue current medications and treatments.  Assessment & Plan    Active Problems:   Essential hypertension   Hyperlipidemia   Acute renal failure   Sepsis   Nausea vomiting and diarrhea   Diastolic CHF   C. difficile diarrhea  Sepsis secondary to C. Difficile - With recent lengthy hospitalization with antibiotic use for diverticulitis, since with nausea, vomiting and diarrhea, stool positive for C. Difficile. - Sepsis criteria on admission giving tachypnea, tachycardia and leukocytosis. - Stopped IV antibiotics, did on oral vancomycin, IV Flagyl discontinued. - Lactic acid normalized, continue gentle IV fluids hydration, follow renal function closely. - Requested heart healthy to be regular food, continue Florastor and vancomycin.  Acute renal failure - Recently creatinine was 7.1, likely ATN. - IV fluids discontinued, creatinine today is 1.5.  Diastolic CHF - Last EF 67-61% with a grade 1 diastolic dysfunction . - Patient appears to be dehydrated and hypovolemic, discontinue IV fluids still euvolemic.  Hyponatremia - Secondary to volume depletion, continue with IV normal saline.  Hypokalemia - Secondary to the diarrhea, replace with oral supplements. Check BMP in a.m.  Hypertension - On the higher side, resume home medication.  Abnormal kidney lesions in the CT  abdomen will need further workup as outpatient.   Code Status: Full  Family Communication: husband at bedside  Disposition Plan: PT/OT   Procedures  None   Consults  None   Medications  Scheduled Meds: . amLODipine  5 mg Oral Daily  . aspirin EC  81 mg Oral Daily  . cycloSPORINE  1 drop Both Eyes BID  . donepezil  5 mg Oral QHS  . heparin subcutaneous  5,000 Units Subcutaneous 3 times per day  . hydrALAZINE  25 mg Oral 3 times per day  . metoprolol tartrate  25 mg Oral BID  . saccharomyces boulardii  250 mg Oral BID  . sertraline  50 mg Oral Daily  . vancomycin  125 mg Oral QID  . cyanocobalamin  500 mcg Oral Daily   Continuous Infusions:   PRN Meds:.acetaminophen **OR** acetaminophen, hydrALAZINE, LORazepam, ondansetron **OR** ondansetron (ZOFRAN) IV  DVT Prophylaxis  Lovenox -  Lab Results  Component Value Date   PLT 316 04/09/2015    Antibiotics    Anti-infectives    Start     Dose/Rate Route Frequency Ordered Stop   04/07/15 0600  vancomycin (VANCOCIN) IVPB 1000 mg/200 mL premix  Status:  Discontinued     1,000 mg 200 mL/hr over 60 Minutes Intravenous Every 48 hours 04/05/15 0532 04/05/15 1853   04/05/15 1600  piperacillin-tazobactam (ZOSYN) IVPB 2.25 g  Status:  Discontinued     2.25 g 100 mL/hr over 30 Minutes Intravenous Every 8 hours 04/05/15  1510 04/05/15 1859   04/05/15 1400  piperacillin-tazobactam (ZOSYN) IVPB 2.25 g  Status:  Discontinued     2.25 g 100 mL/hr over 30 Minutes Intravenous Every 8 hours 04/05/15 0530 04/05/15 1016   04/05/15 1100  vancomycin (VANCOCIN) 50 mg/mL oral solution 125 mg     125 mg Oral 4 times daily 04/05/15 1019 04/19/15 0959   04/05/15 0815  metroNIDAZOLE (FLAGYL) IVPB 500 mg  Status:  Discontinued     500 mg 100 mL/hr over 60 Minutes Intravenous Every 8 hours 04/05/15 0801 04/05/15 1610   04/05/15 0600  vancomycin (VANCOCIN) IVPB 1000 mg/200 mL premix     1,000 mg 200 mL/hr over 60 Minutes Intravenous STAT  04/05/15 0531 04/05/15 0812   04/05/15 0530  piperacillin-tazobactam (ZOSYN) IVPB 2.25 g     2.25 g 100 mL/hr over 30 Minutes Intravenous STAT 04/05/15 0523 04/05/15 0712          Objective:   Filed Vitals:   04/08/15 0424 04/08/15 1436 04/08/15 2115 04/09/15 0446  BP: 132/53 153/63 169/72 137/61  Pulse: 65 66 74 80  Temp: 98.7 F (37.1 C) 98.8 F (37.1 C) 98.2 F (36.8 C) 98.1 F (36.7 C)  TempSrc: Oral Oral Oral Oral  Resp: 18 20 19 18   Height:      Weight: 78.2 kg (172 lb 6.4 oz)   78.9 kg (173 lb 15.1 oz)  SpO2: 98% 100% 99% 100%    Wt Readings from Last 3 Encounters:  04/09/15 78.9 kg (173 lb 15.1 oz)  03/31/15 79.3 kg (174 lb 13.2 oz)  03/22/15 85.276 kg (188 lb)     Intake/Output Summary (Last 24 hours) at 04/09/15 1159 Last data filed at 04/08/15 1500  Gross per 24 hour  Intake    340 ml  Output      0 ml  Net    340 ml     Physical Exam  Awake Alert, Oriented X 3, frail, No new F.N deficits, Normal affect [place].AT,PERRAL Supple Neck,No JVD, No cervical lymphadenopathy appriciated.  Symmetrical Chest wall movement, Good air movement bilaterally, RRR,No Gallops,Rubs or new Murmurs, No Parasternal Heave +ve B.Sounds, Abd Soft, No tenderness, No organomegaly appriciated, No rebound - guarding or rigidity. No Cyanosis, Clubbing or edema, No new Rash or bruise     Data Review   Micro Results Recent Results (from the past 240 hour(s))  Culture, blood (routine x 2)     Status: None (Preliminary result)   Collection Time: 04/05/15  1:45 AM  Result Value Ref Range Status   Specimen Description BLOOD RIGHT ANTECUBITAL  Final   Special Requests BOTTLES DRAWN AEROBIC AND ANAEROBIC 5CC  Final   Culture   Final           BLOOD CULTURE RECEIVED NO GROWTH TO DATE CULTURE WILL BE HELD FOR 5 DAYS BEFORE ISSUING A FINAL NEGATIVE REPORT Performed at [facility]    Report Status PENDING  Incomplete  Culture, blood (routine x 2)     Status: None  (Preliminary result)   Collection Time: 04/05/15  1:48 AM  Result Value Ref Range Status   Specimen Description BLOOD L HAND  Final   Special Requests BOTTLES DRAWN AEROBIC AND ANAEROBIC 5CC  Final   Culture   Final           BLOOD CULTURE RECEIVED NO GROWTH TO DATE CULTURE WILL BE HELD FOR 5 DAYS BEFORE ISSUING A FINAL NEGATIVE REPORT Performed at [facility]  Report Status PENDING  Incomplete  Clostridium Difficile by PCR     Status: Abnormal   Collection Time: 04/05/15  7:59 AM  Result Value Ref Range Status   C difficile by pcr POSITIVE (A) NEGATIVE Final    Comment: CRITICAL RESULT CALLED TO, READ BACK BY AND VERIFIED WITH: A. WARD RN AT 1000 ON 04.26.16 BY [facility]     Radiology Reports No results found.  CBC  Recent Labs Lab 04/05/15 0148 04/05/15 0820 04/06/15 0158 04/09/15 0424  WBC 20.8* 17.3* 14.2* 9.0  HGB 12.4 10.7* 10.4* 10.3*  HCT 36.0 31.6* 30.7* 30.8*  PLT 367 334 297 316  MCV 88.9 88.8 88.7 89.5  MCH 30.6 30.1 30.1 29.9  MCHC 34.4 33.9 33.9 33.4  RDW 13.2 13.3 13.3 13.2  LYMPHSABS 0.5* 0.7  --   --   MONOABS 1.5* 0.8  --   --   EOSABS 0.1 0.0  --   --   BASOSABS 0.0 0.0  --   --     Chemistries   Recent Labs Lab 04/05/15 0148 04/05/15 0820 04/06/15 0145 04/06/15 0817 04/07/15 1020 04/08/15 0525 04/09/15 0424  NA 128* 130* 133* 133* 136 140 138  K 3.0* 3.0* 2.7* 3.3* 3.3* 4.2 3.6  CL 97 99 103 106 110 115* 111  CO2 20 18* 21 20 20 19 19   GLUCOSE 144* 173* 125* 121* 114* 103* 97  BUN 43* 35* 31* 30* 22 17 12   CREATININE 3.38* 3.13* 2.77* 2.62* 2.01* 1.68* 1.52*  CALCIUM 8.9 8.1* 8.0* 8.0* 8.0* 8.4 8.4  MG  --   --  1.5  --   --  1.9  --   AST 16 17  --   --   --   --   --   ALT 16 13  --   --   --   --   --   ALKPHOS 86 74  --   --   --   --   --   BILITOT 1.1 0.9  --   --   --   --   --    ------------------------------------------------------------------------------------------------------------------ estimated creatinine  clearance is 31.2 mL/min (by C-G formula based on Cr of 1.52). ------------------------------------------------------------------------------------------------------------------ No results for input(s): HGBA1C in the last 72 hours. ------------------------------------------------------------------------------------------------------------------ No results for input(s): CHOL, HDL, LDLCALC, TRIG, CHOLHDL, LDLDIRECT in the last 72 hours. ------------------------------------------------------------------------------------------------------------------ No results for input(s): TSH, T4TOTAL, T3FREE, THYROIDAB in the last 72 hours.  Invalid input(s): FREET3 ------------------------------------------------------------------------------------------------------------------ No results for input(s): VITAMINB12, FOLATE, FERRITIN, TIBC, IRON, RETICCTPCT in the last 72 hours.  Coagulation profile  Recent Labs Lab 04/05/15 0820  INR 1.20    No results for input(s): DDIMER in the last 72 hours.  Cardiac Enzymes  Recent Labs Lab 04/05/15 0820  TROPONINI 0.04*   ------------------------------------------------------------------------------------------------------------------ Invalid input(s): POCBNP     Time Spent in minutes   30 minutes    Kareen Jefferys A M.D on 04/09/2015 at 11:59 AM  Between 7am to 7pm - Pager - [phone]  After 7pm go to [email or web address] - password [email or web address]  And look for the night coverage person covering for me after hours  [facility] Office  [phone]

## 2015-04-10 LAB — CBC
HCT: 32.3 % — ABNORMAL LOW (ref 36.0–46.0)
Hemoglobin: 10.5 g/dL — ABNORMAL LOW (ref 12.0–15.0)
MCH: 29.2 pg (ref 26.0–34.0)
MCHC: 32.5 g/dL (ref 30.0–36.0)
MCV: 89.7 fL (ref 78.0–100.0)
PLATELETS: 367 10*3/uL (ref 150–400)
RBC: 3.6 MIL/uL — AB (ref 3.87–5.11)
RDW: 13.2 % (ref 11.5–15.5)
WBC: 8.4 10*3/uL (ref 4.0–10.5)

## 2015-04-10 LAB — GLUCOSE, CAPILLARY
GLUCOSE-CAPILLARY: 108 mg/dL — AB (ref 70–99)
Glucose-Capillary: 112 mg/dL — ABNORMAL HIGH (ref 70–99)

## 2015-04-10 LAB — BASIC METABOLIC PANEL
Anion gap: 4 — ABNORMAL LOW (ref 5–15)
BUN: 11 mg/dL (ref 6–20)
CALCIUM: 8.2 mg/dL — AB (ref 8.9–10.3)
CO2: 19 mmol/L — ABNORMAL LOW (ref 22–32)
Chloride: 113 mmol/L — ABNORMAL HIGH (ref 101–111)
Creatinine, Ser: 1.57 mg/dL — ABNORMAL HIGH (ref 0.44–1.00)
GFR calc Af Amer: 35 mL/min — ABNORMAL LOW (ref >60)
GFR calc non Af Amer: 30 mL/min — ABNORMAL LOW (ref >60)
GLUCOSE: 104 mg/dL — AB (ref 70–99)
Potassium: 4.2 mmol/L (ref 3.5–5.1)
Sodium: 136 mmol/L (ref 135–145)

## 2015-04-10 MED ORDER — VANCOMYCIN HCL 125 MG PO CAPS
125.0000 mg | ORAL_CAPSULE | Freq: Four times a day (QID) | ORAL | Status: DC
Start: 2015-04-10 — End: 2015-05-06

## 2015-04-10 NOTE — Discharge Summary (Signed)
Physician Discharge Summary  Toni [name] HQI:[id number] DOB: July 04, 1934 DOA: 04/04/2015  PCP: Unice Cobble, MD  Admit date: 04/04/2015 Discharge date: 04/10/2015  Time spent: 40 minutes  Recommendations for Outpatient Follow-up:  1. Follow-up with primary care physician on the fourth. 2. Discharge on 10 more days of oral vancomycin 4 times a day. 3. Abnormal kidney lesions in the CT abdomen will need further workup (MRI) as outpatient.  Discharge Diagnoses:  Active Problems:   Essential hypertension   Hyperlipidemia   Acute renal failure   Sepsis   Nausea vomiting and diarrhea   Diastolic CHF   C. difficile diarrhea   Discharge Condition: Stable  Diet recommendation: Heart healthy  Filed Weights   04/08/15 0424 04/09/15 0446 04/10/15 0544  Weight: 78.2 kg (172 lb 6.4 oz) 78.9 kg (173 lb 15.1 oz) 79.1 kg (174 lb 6.1 oz)    History of present illness:  Toni [name] is a 81 y.o. female who was recently admitted to the hospital for acute diverticulitis complicated by acute renal failure and acute pulmonary edema was brought to the ER after patient has been having persistent nausea vomiting and diarrhea since patient got discharged. Patient states she was not able to take in anything. Patient denies any abdominal pain. In the ER patient's initial lactic acid level was normal but repeat one showed more than 4. Patient was not hypotensive. CT abdomen and pelvis shows improvement of her diverticulitis. CBC shows leukocytosis and patient was febrile. Patient was given folate is normal seen bolus at this time by the ER physician patient will be admitted for sepsis most likely from intra-abdominal source. Patient denies any chest pain or shortness of breath or any productive cough. Patient states since her discharge she has not taken any Lasix. Patient abdomen on exam appears benign.   Hospital Course:   Sepsis secondary to C. Difficile - With recent lengthy hospitalization  with antibiotic use for diverticulitis, since with nausea, vomiting and diarrhea, stool positive for C. Difficile. - Sepsis criteria on admission giving tachypnea, tachycardia and leukocytosis. - Stopped IV antibiotics, did on oral vancomycin, IV Flagyl discontinued. - Lactic acid normalized, continue gentle IV fluids hydration, follow renal function closely. - Tolerated regular food without problems. - Discharged on oral vancomycin for 10 more days to complete 2 weeks of oral vancomycin.  Acute renal failure - Recently creatinine was 7.1, likely ATN. - IV fluids discontinued, creatinine improved during hospital stay, discharge date creatinine was 1.5.  Diastolic CHF - Last EF 13-24% with a grade 1 diastolic dysfunction . - Patient appears to be dehydrated and hypovolemic, was in IV fluids initially, discontinued, no decompensation.  Hyponatremia - Secondary to volume depletion, normalized after initiation of IV fluids.  Hypokalemia - Secondary to the diarrhea, repleted with oral supplements.  Hypertension - On the higher side, resume home medication.    Procedures:  None  Consultations:  None  Discharge Exam: Filed Vitals:   04/10/15 0544  BP: 136/56  Pulse: 77  Temp: 99.1 F (37.3 C)  Resp: 18   General: Alert and awake, oriented x3, not in any acute distress. HEENT: anicteric sclera, pupils reactive to light and accommodation, EOMI CVS: S1-S2 clear, no murmur rubs or gallops Chest: clear to auscultation bilaterally, no wheezing, rales or rhonchi Abdomen: soft nontender, nondistended, normal bowel sounds, no organomegaly Extremities: no cyanosis, clubbing or edema noted bilaterally Neuro: Cranial nerves II-XII intact, no focal neurological deficits  Discharge Instructions   Discharge Instructions  Diet - low sodium heart healthy    Complete by:  As directed      Increase activity slowly    Complete by:  As directed           Current Discharge  Medication List    START taking these medications   Details  vancomycin (VANCOCIN HCL) 125 MG capsule Take 1 capsule (125 mg total) by mouth 4 (four) times daily. Qty: 40 capsule, Refills: 0      CONTINUE these medications which have NOT CHANGED   Details  amLODipine (NORVASC) 5 MG tablet Take 1 tablet (5 mg total) by mouth daily. Qty: 30 tablet, Refills: 1    aspirin 81 MG tablet Take 81 mg by mouth daily.    Coenzyme Q10 300 MG CAPS Take 1 capsule by mouth daily.     cyanocobalamin 500 MCG tablet Take 500 mcg by mouth daily.    cycloSPORINE (RESTASIS) 0.05 % ophthalmic emulsion Place 1 drop into both eyes 2 (two) times daily.     docusate sodium (COLACE) 100 MG capsule Take 100 mg by mouth 2 (two) times daily.    donepezil (ARICEPT) 5 MG tablet Take 1 tablet (5 mg total) by mouth at bedtime. Qty: 30 tablet, Refills: 2   Associated Diagnoses: Memory loss    hydrALAZINE (APRESOLINE) 25 MG tablet TAKE 1 TABLET (25 MG TOTAL) BY MOUTH 3 (THREE) TIMES DAILY. Qty: 270 tablet, Refills: 0    LORazepam (ATIVAN) 0.5 MG tablet Take 1 tablet (0.5 mg total) by mouth at bedtime as needed for anxiety or sleep. Qty: 30 tablet, Refills: 1    metoprolol tartrate (LOPRESSOR) 25 MG tablet Take 1 tablet (25 mg total) by mouth 2 (two) times daily. Qty: 180 tablet, Refills: 3    nystatin (MYCOSTATIN) 100000 UNIT/ML suspension Take 5 mLs (500,000 Units total) by mouth 4 (four) times daily. Qty: 60 mL, Refills: 0    Probiotic Product (RESTORA) CAPS Take 1 capsule by mouth daily.     sertraline (ZOLOFT) 50 MG tablet Take 1 tablet (50 mg total) by mouth daily. Qty: 30 tablet, Refills: 5    omeprazole (PRILOSEC) 20 MG capsule Take 1 capsule by mouth daily as needed (heartburn).       STOP taking these medications     cholestyramine (QUESTRAN) 4 GM/DOSE powder        Allergies  Allergen Reactions  . Mavik [Trandolapril]     02/22/15 cough   Follow-up Information    Follow up with  Unice Cobble, MD. Go on 04/13/2015.   Specialty:  Internal Medicine   Why:  Your 04/06/15 appointment was re scheduled for 04/13/15 at 2:30 pm,1. Abnormal kidney lesions in the CT abdomen will need further workup as outpatient.   Contact information:   [address] [address] [phone]        The results of significant diagnostics from this hospitalization (including imaging, microbiology, ancillary and laboratory) are listed below for reference.    Significant Diagnostic Studies: Ct Abdomen Pelvis Wo Contrast  04/05/2015   CLINICAL DATA:  Nausea, vomiting and diarrhea. Recent hospitalization for similar symptoms. History of cholecystectomy, hysterectomy, colonic polyp, diverticulosis and hepatic cyst.  EXAM: CT ABDOMEN AND PELVIS WITHOUT CONTRAST  TECHNIQUE: Multidetector CT imaging of the abdomen and pelvis was performed following the standard protocol without IV contrast.  COMPARISON:  CT of the abdomen and pelvis March 24, 2015 in CT of the abdomen and pelvis Apr 15, 2007  FINDINGS: Mild  to moderate respiratory motion degraded examination.  LUNG BASES: Included view of the lung bases are clear. The visualized heart and pericardium are unremarkable.  KIDNEYS/BLADDER: Kidneys are orthotopic, demonstrating normal size and morphology. No nephrolithiasis, hydronephrosis; limited assessment for renal masses on this nonenhanced examination. 2.5 cm partially calcified mass in RIGHT renal pelvis was present in 2008. Stable 4 cm cyst lower pole of RIGHT kidney, stable 2.8 cm cyst upper pole of LEFT kidney. Exophytic LEFT upper pole 3.2 x 2.6 cm mass again seen, 26 Hounsfield units, relatively stable from 2008. The unopacified ureters are normal in course and caliber. Urinary bladder is partially distended and unremarkable.  SOLID ORGANS: The spleen, pancreas and adrenal glands are unremarkable for this non-contrast examination. Multiple low-density cysts throughout the liver, largest in RIGHT  lobe measuring 9.5 x 11.6 cm, 3 Hounsfield units, unchanged. Status post cholecystectomy.  GASTROINTESTINAL TRACT: Enteric contrast has not yet reached the distal small bowel. Small to moderate hiatal hernia. Severe sigmoid diverticulosis, with mild residual inflammatory changes. The stomach, small bowel are normal in course and caliber without inflammatory changes, the sensitivity may be decreased by lack of enteric contrast. Fluid within the colon, rectal air-fluid level. The appendix is not discretely identified, however there are no inflammatory changes in the right lower quadrant.  PERITONEUM/RETROPERITONEUM: Aortoiliac vessels are normal in course and caliber fell moderate to severe calcific atherosclerosis. No lymphadenopathy by CT size criteria. Status post cholecystectomy. No intraperitoneal free fluid nor free air.  SOFT TISSUES/ OSSEOUS STRUCTURES:  Nonsuspicious.  IMPRESSION: Motion degraded examination. Improving uncomplicated sigmoid diverticulitis.  Indeterminate renal lesions bilaterally, including partially calcified RIGHT renal pelvis lesion for which follow-up MRI is recommended with contrast on a nonemergent basis. This recommendation follows ACR consensus guidelines: Managing Incidental Findings on Abdominal CT: White Paper of the ACR Incidental Findings Committee. J Am Coll Radiol 2010;7:754-773  Stable hepatic cysts.  Status post cholecystectomy.   Electronically Signed   By: Elon Alas   On: 04/05/2015 05:20   Ct Abdomen Pelvis Wo Contrast  03/24/2015   CLINICAL DATA:  Lower abdominal pain, diarrhea and vomiting for 4 days.  EXAM: CT ABDOMEN AND PELVIS WITHOUT CONTRAST  TECHNIQUE: Multidetector CT imaging of the abdomen and pelvis was performed following the standard protocol without IV contrast.  COMPARISON:  CT abdomen and pelvis 04/15/2007.  FINDINGS: Linear atelectasis or scar is seen in the lung base cyst. No pleural or pericardial effusion.  Multiple cystic lesions are  identified in the liver. The largest is in the right hepatic lobe measuring 11.5 cm transverse by 9.5 cm AP by 9.7 cm craniocaudal, increased from 9.8 cm transverse by 7.0 cm AP by 7.5 cm craniocaudal. An exophytic cyst off the upper pole of the left kidney measures 3.3 x 3.0 cm in the axial plane compared to 2.2 x 1.5 cm on the prior examination. The cyst has Hounsfield unit measurements of 22.7. Parapelvic cysts are also seen bilaterally. In the lower pole of the right kidney, there is a lesion containing calcifications measuring 2.7 cm in diameter which is unchanged. The adrenal glands, spleen, pancreas and biliary tree all appear normal  A small fat containing umbilical hernia is identified. The patient has extensive sigmoid diverticulosis. There is mild stranding about the sigmoid colon most notable in its mid segment. The walls of the sigmoid colon are thickened. No abscess or perforation is identified. The colon is otherwise unremarkable. The appendix is not visualized and may have been removed. Small hiatal hernia is  noted. The stomach is otherwise unremarkable. The small bowel appears normal. Scattered aortoiliac atherosclerosis without aneurysm is identified. No lymphadenopathy or fluid is seen.  No lytic or sclerotic bony lesion is identified.  IMPRESSION: Extensive sigmoid diverticulosis with findings compatible with superimposed diverticulitis. No abscess or perforation.  Multiple hepatic and bilateral renal cyst. Some of these have increased in size but no aggressive features are identified.  Aortoiliac atherosclerosis without aneurysm.   Electronically Signed   By: Inge Rise M.D.   On: 03/24/2015 13:02   Dg Chest 2 View  03/28/2015   CLINICAL DATA:  Renal failure  EXAM: CHEST  2 VIEW  COMPARISON:  03/28/2014  FINDINGS: Hilar regions and right peritracheal region exhibits increased soft tissue density. Heterogeneous opacities in the left mid and lower lung zone. Heterogeneous opacities  throughout the right lung, extending from the hilar regions. No pneumothorax. Bilateral small pleural effusions. Mild cardiomegaly.  IMPRESSION: The above findings most likely represent volume overload and CHF. Followup studies in the short-term are recommended to ensure resolution of these findings as there is prominence of the mediastinal and hilar soft tissues.   Electronically Signed   By: Marybelle Killings M.D.   On: 03/28/2015 17:15   Mr Brain Wo Contrast  03/31/2015   CLINICAL DATA:  Stroke  EXAM: MRI HEAD WITHOUT CONTRAST  TECHNIQUE: Multiplanar, multiecho pulse sequences of the brain and surrounding structures were obtained without intravenous contrast.  COMPARISON:  CT head 03/03/2015  FINDINGS: Negative for acute infarct.  Generalized atrophy. Ventricular enlargement consistent with atrophy  Chronic microvascular ischemic changes throughout the white matter bilaterally. Chronic infarcts in the occipital lobes bilaterally right greater than left. Small chronic infarct right parietal cortex. Small chronic infarcts right and left cerebellum. Brainstem intact. Basal ganglia intact.  Negative for hemorrhage or fluid collection  Negative for mass or edema.  No shift of the midline structures.  IMPRESSION: Atrophy and chronic ischemia.  No acute infarct.   Electronically Signed   By: Franchot Gallo M.D.   On: 03/31/2015 08:16   US Renal  03/24/2015   CLINICAL DATA:  Acute renal failure  EXAM: RENAL/URINARY TRACT ULTRASOUND COMPLETE  COMPARISON:  CT abdomen pelvis dated 03/24/2015.  FINDINGS: Right Kidney:  Length: 12.8 cm. 4.0 x 4.5 x 3.7 cm interpolar simple cyst. 2.2 x 2.2 x 2.2 cm calcified lesion in the renal sinus, poorly evaluated on ultrasound. This lesion measured 2.7 cm on the current CT, previously 2.7 cm in 2008 and 2.0 cm in 2002. No hydronephrosis.  Left Kidney:  Length: 12.8 cm. Two upper pole simple cysts, measuring 2.9 x 2.9 x 2.4 cm and 2.6 x 2.8 x 2.3 cm. No hydronephrosis.  Bladder:  Within  normal limits.  IMPRESSION: 2.2 cm calcified lesion in the right renal sinus, better visualized on CT, where it measured 2.7 cm. Lesion is unchanged in size since 2008 and therefore likely benign. If definitive characterization is desired, consider follow-up MRI with contrast when renal function improves.  Additional simple bilateral renal cysts, as above.  No hydronephrosis.   Electronically Signed   By: Julian Hy M.D.   On: 03/24/2015 17:14   Dg Chest Port 1 View  04/05/2015   CLINICAL DATA:  81 year old female with fever, nausea, vomiting and diarrhea.  EXAM: PORTABLE CHEST - 1 VIEW  COMPARISON:  Recent prior chest x-ray 03/28/2015  FINDINGS: Stable cardiac and mediastinal contours. Mild central bronchitic change and interstitial prominence. No pulmonary edema, pleural effusion, focal airspace consolidation or  pneumothorax. No acute osseous abnormality.  IMPRESSION: No active cardiopulmonary process.   Electronically Signed   By: Jacqulynn Cadet M.D.   On: 04/05/2015 03:00   Dg Chest Port 1 View  03/29/2015   CLINICAL DATA:  Pulmonary edema, acute renal insufficiency, diverticulitis.  EXAM: PORTABLE CHEST - 1 VIEW  COMPARISON:  PA and lateral chest of March 28, 2015  FINDINGS: The lungs are well-expanded. The pulmonary interstitial markings remain increased. The left hemidiaphragm is less well demonstrated today. Traces of pleural fluid blunt the costophrenic angles. There are increased perihilar interstitial and alveolar opacities on the right. The retrocardiac region remains dense. The cardiac silhouette is enlarged. The mediastinum is normal in width.  IMPRESSION: Pulmonary edema likely of cardiac cause more conspicuous today. Perihilar alveolar opacities on the right and increased retrocardiac density on the left may reflect pneumonia.   Electronically Signed   By: David  Martinique   On: 03/29/2015 07:55    Microbiology: Recent Results (from the past 240 hour(s))  Culture, blood (routine x  2)     Status: None (Preliminary result)   Collection Time: 04/05/15  1:45 AM  Result Value Ref Range Status   Specimen Description BLOOD RIGHT ANTECUBITAL  Final   Special Requests BOTTLES DRAWN AEROBIC AND ANAEROBIC 5CC  Final   Culture   Final           BLOOD CULTURE RECEIVED NO GROWTH TO DATE CULTURE WILL BE HELD FOR 5 DAYS BEFORE ISSUING A FINAL NEGATIVE REPORT Performed at [facility]    Report Status PENDING  Incomplete  Culture, blood (routine x 2)     Status: None (Preliminary result)   Collection Time: 04/05/15  1:48 AM  Result Value Ref Range Status   Specimen Description BLOOD L HAND  Final   Special Requests BOTTLES DRAWN AEROBIC AND ANAEROBIC 5CC  Final   Culture   Final           BLOOD CULTURE RECEIVED NO GROWTH TO DATE CULTURE WILL BE HELD FOR 5 DAYS BEFORE ISSUING A FINAL NEGATIVE REPORT Performed at [facility]    Report Status PENDING  Incomplete  Clostridium Difficile by PCR     Status: Abnormal   Collection Time: 04/05/15  7:59 AM  Result Value Ref Range Status   C difficile by pcr POSITIVE (A) NEGATIVE Final    Comment: CRITICAL RESULT CALLED TO, READ BACK BY AND VERIFIED WITH: A. WARD RN AT 1000 ON 04.26.16 BY SHUEA      Labs: Basic Metabolic Panel:  Recent Labs Lab 04/06/15 0145 04/06/15 0817 04/07/15 1020 04/08/15 0525 04/09/15 0424 04/10/15 0541  NA 133* 133* 136 140 138 136  K 2.7* 3.3* 3.3* 4.2 3.6 4.2  CL 103 106 110 115* 111 113*  CO2 21 20 20 19 19  19*  GLUCOSE 125* 121* 114* 103* 97 104*  BUN 31* 30* 22 17 12 11   CREATININE 2.77* 2.62* 2.01* 1.68* 1.52* 1.57*  CALCIUM 8.0* 8.0* 8.0* 8.4 8.4 8.2*  MG 1.5  --   --  1.9  --   --    Liver Function Tests:  Recent Labs Lab 04/05/15 0148 04/05/15 0820  AST 16 17  ALT 16 13  ALKPHOS 86 74  BILITOT 1.1 0.9  PROT 7.6 6.3  ALBUMIN 3.9 3.2*    Recent Labs Lab 04/05/15 0148  LIPASE 30   No results for input(s): AMMONIA in the last 168 hours. CBC:  Recent  Labs Lab 04/05/15  1856 04/05/15 0820 04/06/15 0158 04/09/15 0424 04/10/15 0541  WBC 20.8* 17.3* 14.2* 9.0 8.4  NEUTROABS 18.8* 15.8*  --   --   --   HGB 12.4 10.7* 10.4* 10.3* 10.5*  HCT 36.0 31.6* 30.7* 30.8* 32.3*  MCV 88.9 88.8 88.7 89.5 89.7  PLT 367 334 297 316 367   Cardiac Enzymes:  Recent Labs Lab 04/05/15 0820  TROPONINI 0.04*   BNP: BNP (last 3 results) No results for input(s): BNP in the last 8760 hours.  ProBNP (last 3 results) No results for input(s): PROBNP in the last 8760 hours.  CBG:  Recent Labs Lab 04/09/15 0800 04/09/15 1159 04/09/15 1634 04/09/15 2005 04/10/15 0734  GLUCAP 94 103* 104* 107* 108*       Signed:  Geovanni Rahming A  [facility] 04/10/2015, 11:04 AM

## 2015-04-11 ENCOUNTER — Telehealth: Payer: Self-pay | Admitting: *Deleted

## 2015-04-11 LAB — CULTURE, BLOOD (ROUTINE X 2)
CULTURE: NO GROWTH
Culture: NO GROWTH

## 2015-04-11 NOTE — Telephone Encounter (Signed)
Transition Care Management Follow-up Telephone Call D/c 04/10/15  How have you been since you were released from the hospital? Pt states she is feeling ok little better   Do you understand why you were in the hospital? YES   Do you understand the discharge instrcutions? YES  Items Reviewed:  Medications reviewed: YES  Allergies reviewed: YES  Dietary changes reviewed: YES, heart healthy  Referrals reviewed: No referral needed   Functional Questionnaire:   Activities of Daily Living (ADLs):   She states she are independent in the following: ambulation, bathing and hygiene, feeding, continence, grooming, toileting and dressing States she doesn't  require assistance    Any transportation issues/concerns?: YES   Any patient concerns? NO   Confirmed importance and date/time of follow-up visits scheduled: YES, pt call and made appt 04/13/15 w/Dr. Linna Darner advise pt to keep appt   Confirmed with patient if condition begins to worsen call PCP or go to the ER.

## 2015-04-13 ENCOUNTER — Other Ambulatory Visit (INDEPENDENT_AMBULATORY_CARE_PROVIDER_SITE_OTHER): Payer: Medicare Other

## 2015-04-13 ENCOUNTER — Encounter: Payer: Self-pay | Admitting: Internal Medicine

## 2015-04-13 ENCOUNTER — Ambulatory Visit (INDEPENDENT_AMBULATORY_CARE_PROVIDER_SITE_OTHER): Payer: Medicare Other | Admitting: Internal Medicine

## 2015-04-13 VITALS — BP 142/72 | HR 74 | Temp 98.4°F | Resp 15 | Ht 65.0 in | Wt 170.0 lb

## 2015-04-13 DIAGNOSIS — A047 Enterocolitis due to Clostridium difficile: Secondary | ICD-10-CM

## 2015-04-13 DIAGNOSIS — I10 Essential (primary) hypertension: Secondary | ICD-10-CM | POA: Diagnosis not present

## 2015-04-13 DIAGNOSIS — N289 Disorder of kidney and ureter, unspecified: Secondary | ICD-10-CM | POA: Diagnosis not present

## 2015-04-13 DIAGNOSIS — A0472 Enterocolitis due to Clostridium difficile, not specified as recurrent: Secondary | ICD-10-CM

## 2015-04-13 DIAGNOSIS — N179 Acute kidney failure, unspecified: Secondary | ICD-10-CM | POA: Diagnosis not present

## 2015-04-13 LAB — BASIC METABOLIC PANEL
BUN: 16 mg/dL (ref 6–23)
CALCIUM: 9.5 mg/dL (ref 8.4–10.5)
CO2: 23 mEq/L (ref 19–32)
Chloride: 101 mEq/L (ref 96–112)
Creatinine, Ser: 1.73 mg/dL — ABNORMAL HIGH (ref 0.40–1.20)
GFR: 30.18 mL/min — AB (ref >60.00)
GLUCOSE: 96 mg/dL (ref 70–99)
POTASSIUM: 4 meq/L (ref 3.5–5.1)
Sodium: 134 mEq/L — ABNORMAL LOW (ref 135–145)

## 2015-04-13 LAB — CBC WITH DIFFERENTIAL/PLATELET
BASOS ABS: 0 10*3/uL (ref 0.0–0.1)
Basophils Relative: 0.4 % (ref 0.0–3.0)
Eosinophils Absolute: 0.2 10*3/uL (ref 0.0–0.7)
Eosinophils Relative: 1.5 % (ref 0.0–5.0)
HCT: 34.8 % — ABNORMAL LOW (ref 36.0–46.0)
HEMOGLOBIN: 12.1 g/dL (ref 12.0–15.0)
Lymphocytes Relative: 22 % (ref 12.0–46.0)
Lymphs Abs: 2.5 10*3/uL (ref 0.7–4.0)
MCHC: 34.7 g/dL (ref 30.0–36.0)
MCV: 86.9 fl (ref 78.0–100.0)
Monocytes Absolute: 0.7 10*3/uL (ref 0.1–1.0)
Monocytes Relative: 6 % (ref 3.0–12.0)
NEUTROS ABS: 8 10*3/uL — AB (ref 1.4–7.7)
NEUTROS PCT: 70.1 % (ref 43.0–77.0)
PLATELETS: 563 10*3/uL — AB (ref 150.0–400.0)
RBC: 4 Mil/uL (ref 3.87–5.11)
RDW: 13.6 % (ref 11.5–15.5)
WBC: 11.4 10*3/uL — ABNORMAL HIGH (ref 4.0–10.5)

## 2015-04-13 NOTE — Progress Notes (Signed)
Pre visit review using our clinic review tool, if applicable. No additional management support is needed unless otherwise documented below in the visit note. 

## 2015-04-13 NOTE — Patient Instructions (Signed)
Minimal Blood Pressure Goal= AVERAGE < 140/90;  Ideal is an AVERAGE < 135/85. This AVERAGE should be calculated from @ least 5-7 BP readings taken @ different times of day on different days of week. You should not respond to isolated BP readings , but rather the AVERAGE for that week .Please bring your  blood pressure cuff to office visits to verify that it is reliable.It  can also be checked against the blood pressure device at the pharmacy.   Your next office appointment will be determined based upon review of your pending labs .Those instructions will be transmitted to you by My Chart Critical results will be called.  Followup as needed for any active or acute issue. Please report any significant change in your symptoms.

## 2015-04-13 NOTE — Assessment & Plan Note (Signed)
Appt with Dr Justin Mend 04/29/2015

## 2015-04-13 NOTE — Progress Notes (Signed)
   Subjective:    Patient ID: Toni [name], female    DOB: 31-Oct-1934, 80 y.o.   MRN: [id number]  HPI Her TWO complicated hospital course was reviewed. She was originally admitted with diverticulitis 4/14- 03/31/13. This was associated with ATN with creatinine of 7.1.Stable 2.2 cm calcified R renal lesion and benign cysts on [place]. She developed acute pulmonary vascular congestion post IV fluids which resolved with aggressive diuresis.Marland Kitchen  Zosyn was changed to Cipro & Flagyl and continued post D/C.  She was readmitted 4/25-04/10/15  With sepsis in context of n,v, & diarrhea with + C dif in stool .Her diverticulitis symptoms had resolved but she was felt to be septic based on tachypnea, tachycardia, leukocytosis, and increased lactic acid level.  Acute renal failure was attributed to nausea and vomiting and diarrhea. Her creatinine was 3.38 on 4/26;at the time of discharge 5/18 was 1.57. GFR was 30.  Oral vancomycin for the C dif was to be continued until 5/11.   At the time of discharge her blood pressure was 136/56. She's not been monitoring it since she's been home. Prior to hospitalization the blood pressure been in the 295J systolic.  At this time she's basically asymptomatic except for poor appetite.   Review of Systems 12# weight loss in context of above. Abdominal pain, significant dyspepsia, dysphagia, melena, rectal bleeding, or persistently small caliber stools are denied.Stools are soft but formed.    Objective:   Physical Exam  General appearance:appears fatigued but adequately nourished w/o distress. Eyes: No conjunctival inflammation or scleral icterus is present.Minor asymmetric anisocoria; OS > OD. Oral exam: Dental hygiene is good; lips and gums are healthy appearing.There is no oropharyngeal erythema or exudate noted.  Heart:  Heart sounds somewhat distant.Normal rate and regular rhythm. S1 normal; S2 slightly accentuated.No gallop, murmur, click, rub or other extra  sounds  Lungs:Chest clear to auscultation; no wheezes, rhonchi,rales ,or rubs present.No increased work of breathing.  Abdomen: bowel sounds normal, soft and non-tender without masses, organomegaly or hernias noted.  No guarding or rebound . No tenderness over the flanks to percussion Musculoskeletal: Able to lie flat and sit up without help. Negative straight leg raising bilaterally. Gait slightly unsteady Skin:Warm & dry.  Intact without suspicious lesions or rashes ; no jaundice or tenting.Scattered forearm bruising Lymphatic: No lymphadenopathy is noted about the head, neck, axilla            Assessment & Plan:  See Current Assessment & Plan in Problem List under specific Diagnosis

## 2015-04-14 NOTE — Assessment & Plan Note (Signed)
F/U as per Dr Justin Mend, Nephrology

## 2015-04-14 NOTE — Assessment & Plan Note (Signed)
Oral Vanc until 04/20/15

## 2015-04-18 DIAGNOSIS — D225 Melanocytic nevi of trunk: Secondary | ICD-10-CM | POA: Diagnosis not present

## 2015-04-18 DIAGNOSIS — L820 Inflamed seborrheic keratosis: Secondary | ICD-10-CM | POA: Diagnosis not present

## 2015-04-29 DIAGNOSIS — N179 Acute kidney failure, unspecified: Secondary | ICD-10-CM | POA: Diagnosis not present

## 2015-05-01 ENCOUNTER — Encounter (HOSPITAL_COMMUNITY): Payer: Self-pay | Admitting: *Deleted

## 2015-05-01 ENCOUNTER — Emergency Department (HOSPITAL_COMMUNITY)
Admission: EM | Admit: 2015-05-01 | Discharge: 2015-05-01 | Disposition: A | Discharge status: Home / Self Care | Payer: Medicare Other | Source: Home / Self Care | Attending: Emergency Medicine | Admitting: Emergency Medicine

## 2015-05-01 DIAGNOSIS — Z87448 Personal history of other diseases of urinary system: Secondary | ICD-10-CM | POA: Insufficient documentation

## 2015-05-01 DIAGNOSIS — E785 Hyperlipidemia, unspecified: Secondary | ICD-10-CM | POA: Diagnosis present

## 2015-05-01 DIAGNOSIS — R197 Diarrhea, unspecified: Secondary | ICD-10-CM

## 2015-05-01 DIAGNOSIS — Z8619 Personal history of other infectious and parasitic diseases: Secondary | ICD-10-CM | POA: Insufficient documentation

## 2015-05-01 DIAGNOSIS — Z87891 Personal history of nicotine dependence: Secondary | ICD-10-CM

## 2015-05-01 DIAGNOSIS — Z86010 Personal history of colonic polyps: Secondary | ICD-10-CM

## 2015-05-01 DIAGNOSIS — Z8639 Personal history of other endocrine, nutritional and metabolic disease: Secondary | ICD-10-CM | POA: Insufficient documentation

## 2015-05-01 DIAGNOSIS — E871 Hypo-osmolality and hyponatremia: Secondary | ICD-10-CM | POA: Diagnosis present

## 2015-05-01 DIAGNOSIS — Z7982 Long term (current) use of aspirin: Secondary | ICD-10-CM

## 2015-05-01 DIAGNOSIS — G43909 Migraine, unspecified, not intractable, without status migrainosus: Secondary | ICD-10-CM

## 2015-05-01 DIAGNOSIS — Z8711 Personal history of peptic ulcer disease: Secondary | ICD-10-CM | POA: Insufficient documentation

## 2015-05-01 DIAGNOSIS — Z888 Allergy status to other drugs, medicaments and biological substances status: Secondary | ICD-10-CM

## 2015-05-01 DIAGNOSIS — D631 Anemia in chronic kidney disease: Secondary | ICD-10-CM | POA: Diagnosis not present

## 2015-05-01 DIAGNOSIS — F329 Major depressive disorder, single episode, unspecified: Secondary | ICD-10-CM | POA: Diagnosis present

## 2015-05-01 DIAGNOSIS — N179 Acute kidney failure, unspecified: Secondary | ICD-10-CM | POA: Diagnosis not present

## 2015-05-01 DIAGNOSIS — Z9049 Acquired absence of other specified parts of digestive tract: Secondary | ICD-10-CM | POA: Diagnosis present

## 2015-05-01 DIAGNOSIS — A047 Enterocolitis due to Clostridium difficile: Principal | ICD-10-CM | POA: Diagnosis present

## 2015-05-01 DIAGNOSIS — E860 Dehydration: Secondary | ICD-10-CM | POA: Diagnosis present

## 2015-05-01 DIAGNOSIS — I10 Essential (primary) hypertension: Secondary | ICD-10-CM

## 2015-05-01 DIAGNOSIS — Z79899 Other long term (current) drug therapy: Secondary | ICD-10-CM | POA: Insufficient documentation

## 2015-05-01 DIAGNOSIS — E875 Hyperkalemia: Secondary | ICD-10-CM | POA: Diagnosis present

## 2015-05-01 DIAGNOSIS — Z792 Long term (current) use of antibiotics: Secondary | ICD-10-CM

## 2015-05-01 DIAGNOSIS — N183 Chronic kidney disease, stage 3 (moderate): Secondary | ICD-10-CM | POA: Diagnosis present

## 2015-05-01 DIAGNOSIS — I129 Hypertensive chronic kidney disease with stage 1 through stage 4 chronic kidney disease, or unspecified chronic kidney disease: Secondary | ICD-10-CM | POA: Diagnosis present

## 2015-05-01 DIAGNOSIS — E876 Hypokalemia: Secondary | ICD-10-CM | POA: Diagnosis present

## 2015-05-01 DIAGNOSIS — Z90710 Acquired absence of both cervix and uterus: Secondary | ICD-10-CM

## 2015-05-01 HISTORY — DX: Acute kidney failure, unspecified: N17.9

## 2015-05-01 HISTORY — DX: Enterocolitis due to Clostridium difficile, not specified as recurrent: A04.72

## 2015-05-01 LAB — COMPREHENSIVE METABOLIC PANEL
ALK PHOS: 69 U/L (ref 38–126)
ALT: 13 U/L — ABNORMAL LOW (ref 14–54)
AST: 16 U/L (ref 15–41)
Albumin: 4 g/dL (ref 3.5–5.0)
Anion gap: 12 (ref 5–15)
BUN: 14 mg/dL (ref 6–20)
CALCIUM: 9 mg/dL (ref 8.9–10.3)
CO2: 21 mmol/L — AB (ref 22–32)
Chloride: 102 mmol/L (ref 101–111)
Creatinine, Ser: 1.28 mg/dL — ABNORMAL HIGH (ref 0.44–1.00)
GFR calc Af Amer: 45 mL/min — ABNORMAL LOW (ref >60)
GFR calc non Af Amer: 39 mL/min — ABNORMAL LOW (ref >60)
Glucose, Bld: 110 mg/dL — ABNORMAL HIGH (ref 65–99)
Potassium: 3.1 mmol/L — ABNORMAL LOW (ref 3.5–5.1)
SODIUM: 135 mmol/L (ref 135–145)
TOTAL PROTEIN: 7.2 g/dL (ref 6.5–8.1)
Total Bilirubin: 0.9 mg/dL (ref 0.3–1.2)

## 2015-05-01 LAB — URINE MICROSCOPIC-ADD ON

## 2015-05-01 LAB — CBC WITH DIFFERENTIAL/PLATELET
BASOS PCT: 0 % (ref 0–1)
Basophils Absolute: 0 10*3/uL (ref 0.0–0.1)
Eosinophils Absolute: 0.2 10*3/uL (ref 0.0–0.7)
Eosinophils Relative: 2 % (ref 0–5)
HCT: 34.5 % — ABNORMAL LOW (ref 36.0–46.0)
Hemoglobin: 11.7 g/dL — ABNORMAL LOW (ref 12.0–15.0)
Lymphocytes Relative: 10 % — ABNORMAL LOW (ref 12–46)
Lymphs Abs: 1 10*3/uL (ref 0.7–4.0)
MCH: 30.2 pg (ref 26.0–34.0)
MCHC: 33.9 g/dL (ref 30.0–36.0)
MCV: 89.1 fL (ref 78.0–100.0)
MONO ABS: 1 10*3/uL (ref 0.1–1.0)
Monocytes Relative: 10 % (ref 3–12)
NEUTROS PCT: 78 % — AB (ref 43–77)
Neutro Abs: 7.9 10*3/uL — ABNORMAL HIGH (ref 1.7–7.7)
PLATELETS: 280 10*3/uL (ref 150–400)
RBC: 3.87 MIL/uL (ref 3.87–5.11)
RDW: 14.2 % (ref 11.5–15.5)
WBC: 10.1 10*3/uL (ref 4.0–10.5)

## 2015-05-01 LAB — URINALYSIS, ROUTINE W REFLEX MICROSCOPIC
BILIRUBIN URINE: NEGATIVE
Glucose, UA: NEGATIVE mg/dL
Hgb urine dipstick: NEGATIVE
Ketones, ur: NEGATIVE mg/dL
NITRITE: NEGATIVE
PH: 5.5 (ref 5.0–8.0)
PROTEIN: NEGATIVE mg/dL
Specific Gravity, Urine: 1.011 (ref 1.005–1.030)
Urobilinogen, UA: 0.2 mg/dL (ref 0.0–1.0)

## 2015-05-01 LAB — LIPASE, BLOOD: Lipase: 30 U/L (ref 22–51)

## 2015-05-01 LAB — I-STAT CG4 LACTIC ACID, ED: LACTIC ACID, VENOUS: 1.16 mmol/L (ref 0.5–2.0)

## 2015-05-01 MED ORDER — POTASSIUM CHLORIDE CRYS ER 20 MEQ PO TBCR
40.0000 meq | EXTENDED_RELEASE_TABLET | Freq: Once | ORAL | Status: AC
  Administered 2015-05-01: 40 meq via ORAL
  Filled 2015-05-01: qty 2

## 2015-05-01 MED ORDER — SODIUM CHLORIDE 0.9 % IV BOLUS (SEPSIS)
1000.0000 mL | Freq: Once | INTRAVENOUS | Status: AC
  Administered 2015-05-01: 1000 mL via INTRAVENOUS

## 2015-05-01 NOTE — ED Provider Notes (Signed)
CSN: [id number]     Arrival date & time 05/01/15  1023 History   First MD Initiated Contact with Patient 05/01/15 1036     Chief Complaint  Patient presents with  . Diarrhea     (Consider location/radiation/quality/duration/timing/severity/associated sxs/prior Treatment) HPI Toni [name] is a 81 year old female with past medical history of hepatic cysts, hypertension, hyperlipidemia, diverticulosis, C. difficile colitis who presents the ER complaining of diarrhea. Patient reports approximate 5 episodes of diarrhea last night and throughout this morning. Patient admitted to the hospital for/14 through 4/22 for diverticulitis associated with acute tubular necrosis creatinine 7.1. Patient was readmitted 4/25 through 04/10/15 with sepsis in contacts of nausea, vomiting, diarrhea with positive C. difficile cultures patient was stabilized, discharged. Patient reports doing well since then, until last night. Patient reports eating pizza, and steak last night. She believes this may have contributed to her symptoms. Patient reports several episodes of loose diarrhea last night, patient states the loose stools felt identical to the loose stool she had associated with C. difficile. Patient denies associated fever, abdominal pain, nausea, vomiting, dysuria or chest pain, shortness of breath, dizziness, weakness. Patient reports normal appetite and normal fluid intake.  Past Medical History  Diagnosis Date  . PUD (peptic ulcer disease) 1987    with h pylori  . Hepatic cyst   . Migraines   . Hypertension   . Hyperlipemia   . Colonic polyp   . Diverticulosis   . Helicobacter pylori gastritis 1987  . C. difficile colitis   . Acute kidney injury    Past Surgical History  Procedure Laterality Date  . Abdominal hysterectomy      BSO ; endometriosis; ? Appendectomy incidentally  . Cholecystectomy  2008  . Rotator cuff repair      right  . Colonoscopy  2003 & 2012    polyps; Dr Collene Mares  . Endoscopy  gastritis  2008  . Tonsillectomy and adenoidectomy     Family History  Problem Relation Age of Onset  . Asthma Brother   . Hypertension Father   . Heart attack Father 15  . Leukemia Mother   . Stomach cancer Maternal Aunt   . Breast cancer Sister   . Thyroid disease Sister   . Stroke Neg Hx   . Diabetes Neg Hx    History  Substance Use Topics  . Smoking status: Former Smoker    Quit date: 12/10/1973  . Smokeless tobacco: Never Used  . Alcohol Use: 0.0 oz/week    0 Standard drinks or equivalent per week     Comment:  socially   OB History    No data available     Review of Systems  Constitutional: Negative for fever.  HENT: Negative for trouble swallowing.   Eyes: Negative for visual disturbance.  Respiratory: Negative for shortness of breath.   Cardiovascular: Negative for chest pain.  Gastrointestinal: Positive for diarrhea. Negative for nausea, vomiting and abdominal pain.  Genitourinary: Negative for dysuria.  Musculoskeletal: Negative for neck pain.  Skin: Negative for rash.  Neurological: Negative for dizziness, weakness and numbness.  Psychiatric/Behavioral: Negative.       Allergies  Mavik  Home Medications   Prior to Admission medications   Medication Sig Start Date End Date Taking? Authorizing Provider  amLODipine (NORVASC) 5 MG tablet Take 1 tablet (5 mg total) by mouth daily. 04/01/15   Venetia Maxon Rama, MD  aspirin 81 MG tablet Take 81 mg by mouth daily.    Historical Provider, MD  Coenzyme Q10 300 MG CAPS Take 1 capsule by mouth daily.     Historical Provider, MD  cyanocobalamin 500 MCG tablet Take 500 mcg by mouth daily.    Historical Provider, MD  cycloSPORINE (RESTASIS) 0.05 % ophthalmic emulsion Place 1 drop into both eyes 2 (two) times daily.     Historical Provider, MD  docusate sodium (COLACE) 100 MG capsule Take 100 mg by mouth 2 (two) times daily.    Historical Provider, MD  donepezil (ARICEPT) 5 MG tablet Take 1 tablet (5 mg total) by  mouth at bedtime. 03/21/15   Hendricks Limes, MD  hydrALAZINE (APRESOLINE) 25 MG tablet TAKE 1 TABLET (25 MG TOTAL) BY MOUTH 3 (THREE) TIMES DAILY. 02/14/15   Hendricks Limes, MD  LORazepam (ATIVAN) 0.5 MG tablet Take 1 tablet (0.5 mg total) by mouth at bedtime as needed for anxiety or sleep. 04/01/15   Venetia Maxon Rama, MD  metoprolol tartrate (LOPRESSOR) 25 MG tablet Take 1 tablet (25 mg total) by mouth 2 (two) times daily. 06/03/14   Hendricks Limes, MD  nystatin (MYCOSTATIN) 100000 UNIT/ML suspension Take 5 mLs (500,000 Units total) by mouth 4 (four) times daily. 04/01/15   Venetia Maxon Rama, MD  omeprazole (PRILOSEC) 20 MG capsule Take 1 capsule by mouth daily as needed (heartburn).  10/13/13   Historical Provider, MD  Probiotic Product (RESTORA) CAPS Take 1 capsule by mouth daily.     Historical Provider, MD  sertraline (ZOLOFT) 50 MG tablet Take 1 tablet (50 mg total) by mouth daily. 08/18/14   Hendricks Limes, MD  vancomycin (VANCOCIN HCL) 125 MG capsule Take 1 capsule (125 mg total) by mouth 4 (four) times daily. 04/10/15   Mutaz Elmahi, MD   BP 146/77 mmHg  Pulse 106  Temp(Src) 98.4 F (36.9 C) (Oral)  Resp 18  SpO2 97% Physical Exam  Constitutional: She is oriented to person, place, and time. She appears well-developed and well-nourished. No distress.  HENT:  Head: Normocephalic and atraumatic.  Mouth/Throat: Oropharynx is clear and moist. No oropharyngeal exudate.  Eyes: Right eye exhibits no discharge. Left eye exhibits no discharge. No scleral icterus.  Neck: Normal range of motion.  Cardiovascular: Normal rate, regular rhythm and normal heart sounds.   No murmur heard. Pulmonary/Chest: Effort normal and breath sounds normal. No respiratory distress.  Abdominal: Soft. Normal appearance. There is no tenderness. There is no rigidity, no guarding, no tenderness at McBurney's point and negative Murphy's sign.  Musculoskeletal: Normal range of motion. She exhibits no edema or tenderness.   Neurological: She is alert and oriented to person, place, and time. She has normal strength. No cranial nerve deficit or sensory deficit. She displays a negative Romberg sign. Coordination normal. GCS eye subscore is 4. GCS verbal subscore is 5. GCS motor subscore is 6.  Patient fully alert, answering questions appropriately in full, clear sentences. Cranial nerves II through XII grossly intact. Motor strength 5 out of 5 in all major muscle groups of upper and lower extremities. Distal sensation intact.   Skin: Skin is warm and dry. No rash noted. She is not diaphoretic.  Psychiatric: She has a normal mood and affect.  Nursing note and vitals reviewed.   ED Course  Procedures (including critical care time) Labs Review Labs Reviewed  CBC WITH DIFFERENTIAL/PLATELET - Abnormal; Notable for the following:    Hemoglobin 11.7 (*)    HCT 34.5 (*)    Neutrophils Relative % 78 (*)    Neutro Abs 7.9 (*)  Lymphocytes Relative 10 (*)    All other components within normal limits  COMPREHENSIVE METABOLIC PANEL - Abnormal; Notable for the following:    Potassium 3.1 (*)    CO2 21 (*)    Glucose, Bld 110 (*)    Creatinine, Ser 1.28 (*)    ALT 13 (*)    GFR calc non Af Amer 39 (*)    GFR calc Af Amer 45 (*)    All other components within normal limits  URINALYSIS, ROUTINE W REFLEX MICROSCOPIC - Abnormal; Notable for the following:    Leukocytes, UA TRACE (*)    All other components within normal limits  URINE MICROSCOPIC-ADD ON - Abnormal; Notable for the following:    Squamous Epithelial / LPF FEW (*)    All other components within normal limits  LIPASE, BLOOD  I-STAT CG4 LACTIC ACID, ED    Imaging Review No results found.   EKG Interpretation None      MDM   Final diagnoses:  Diarrhea    Patient here after partial 5 episodes of loose diarrhea this morning or last night in context of recent admission and discharge for C. difficile colitis. On exam here patient is  well-appearing, afebrile, nontachypneic, non-hypoxic, normotensive and in no acute distress. Mild tachycardia at 114 noted on exam. Patient does not appear to be overly dehydrated. Patient given fluids, cautiously as prior to last admission patient experienced vascular congestion secondary to fluid replacement which required aggressive diuresis. After fluid bolus here, and workup patient states she has not had diarrhea during her entire stay. Heart rate decreased into the 90s for me on repeat exam. Throughout ER stay, patient is asymptomatic, well-appearing, hemodynamically stable and in no acute distress. On repeat exam there is no abdominal tenderness, no concern for acute abdomen. Patient states after 2 hours she has not had any more episodes of diarrhea. Patient requesting to go home. Despite patient's recent illness, there is no evidence of her diarrhea illness this morning leading to acute pathology today. Lactic acid is negative. Patient stable for discharge, I discussed strict return precautions with patient, and strongly encouraged her to follow-up with her primary care physician early this week. Patient verbalizes understanding and agreement of this plan.  BP 146/77 mmHg  Pulse 106  Temp(Src) 98.4 F (36.9 C) (Oral)  Resp 18  SpO2 97%  Signed,  Dahlia Bailiff, PA-C 3:32 PM  Patient seen and discussed with Dr. Pamella Pert, MD    Dahlia Bailiff, PA-C 05/01/15 1533  Pamella Pert, MD 05/02/15 [phone]

## 2015-05-01 NOTE — ED Notes (Signed)
Pt reports being hospitalized twice in last month for AKI, sepsis, and c diff. Pt reports finishing oral vanc at home 5/11. Was feeling better until last night, began having diarrhea again, 5 episodes. Denies abd pain, n/v.

## 2015-05-01 NOTE — ED Notes (Signed)
Patient aware that we need urine

## 2015-05-01 NOTE — Discharge Instructions (Signed)
Clostridium Difficile Toxin This is a test which may be done when a patient has diarrhea that lasts for several days, or has abdominal pain, fever, and nausea after antibiotic therapy.  This test looks for the presence of Clostridium difficile (C.diff.) toxin in a stool sample. C.diff. is a germ (bacterium) that is one of the groups of bacteria that are usually in the colon, called "normal flora." If something upsets the growth of the other normal flora, C.diff. may overgrow and disrupt the balance of bacteria in the colon. C. diff. may produce two toxins, A and B. The combination of overgrowth and toxins may cause prolonged diarrhea. The toxins may damage the lining of the colon and lead to colitis.  While some cases of C. diff. diarrhea and colitis do not require treatment, others require specific oral antibiotic therapy. Most patients improve as the normal flora re-establishes itself, but about some may have one or more relapses, with symptoms and detectible toxin levels coming back. PREPARATION FOR TEST There is no special preparation for the test. A fresh stool sample is collected in a sterile container. The sample should not be mixed with urine or water. The stool should be taken to the lab within an hour. It may be refrigerated or frozen and taken to the lab as soon as possible. The container should be labeled with your name and the date and time of the stool collection.  NORMAL FINDINGS Negative Tissue Culture (no toxin identified) Ranges for normal findings may vary among different laboratories and hospitals. You should always check with your doctor after having lab work or other tests done to discuss the meaning of your test results and whether your values are considered within normal limits. MEANING OF TEST  Your caregiver will go over the test results with you and discuss the importance and meaning of your results, as well as treatment options and the need for additional tests if  necessary. OBTAINING THE TEST RESULTS It is your responsibility to obtain your test results. Ask the lab or department performing the test when and how you will get your results. Document Released: 12/19/2004 Document Revised: 02/18/2012 Document Reviewed: 11/04/2008 [facility] Patient Information 2015 [place], [place]. This information is not intended to replace advice given to you by your health care provider. Make sure you discuss any questions you have with your health care provider.  Diarrhea Diarrhea is frequent loose and watery bowel movements. It can cause you to feel weak and dehydrated. Dehydration can cause you to become tired and thirsty, have a dry mouth, and have decreased urination that often is dark yellow. Diarrhea is a sign of another problem, most often an infection that will not last long. In most cases, diarrhea typically lasts 2-3 days. However, it can last longer if it is a sign of something more serious. It is important to treat your diarrhea as directed by your caregiver to lessen or prevent future episodes of diarrhea. CAUSES  Some common causes include:  Gastrointestinal infections caused by viruses, bacteria, or parasites.  Food poisoning or food allergies.  Certain medicines, such as antibiotics, chemotherapy, and laxatives.  Artificial sweeteners and fructose.  Digestive disorders. HOME CARE INSTRUCTIONS  Ensure adequate fluid intake (hydration): Have 1 cup (8 oz) of fluid for each diarrhea episode. Avoid fluids that contain simple sugars or sports drinks, fruit juices, whole milk products, and sodas. Your urine should be clear or pale yellow if you are drinking enough fluids. Hydrate with an oral rehydration solution that you  can purchase at pharmacies, retail stores, and online. You can prepare an oral rehydration solution at home by mixing the following ingredients together:   - tsp table salt.   tsp baking soda.   tsp salt substitute containing potassium  chloride.  1  tablespoons sugar.  1 L (34 oz) of water.  Certain foods and beverages may increase the speed at which food moves through the gastrointestinal (GI) tract. These foods and beverages should be avoided and include:  Caffeinated and alcoholic beverages.  High-fiber foods, such as raw fruits and vegetables, nuts, seeds, and whole grain breads and cereals.  Foods and beverages sweetened with sugar alcohols, such as xylitol, sorbitol, and mannitol.  Some foods may be well tolerated and may help thicken stool including:  Starchy foods, such as rice, toast, pasta, low-sugar cereal, oatmeal, grits, baked potatoes, crackers, and bagels.  Bananas.  Applesauce.  Add probiotic-rich foods to help increase healthy bacteria in the GI tract, such as yogurt and fermented milk products.  Wash your hands well after each diarrhea episode.  Only take over-the-counter or prescription medicines as directed by your caregiver.  Take a warm bath to relieve any burning or pain from frequent diarrhea episodes. SEEK IMMEDIATE MEDICAL CARE IF:   You are unable to keep fluids down.  You have persistent vomiting.  You have blood in your stool, or your stools are black and tarry.  You do not urinate in 6-8 hours, or there is only a small amount of very dark urine.  You have abdominal pain that increases or localizes.  You have weakness, dizziness, confusion, or light-headedness.  You have a severe headache.  Your diarrhea gets worse or does not get better.  You have a fever or persistent symptoms for more than 2-3 days.  You have a fever and your symptoms suddenly get worse. MAKE SURE YOU:   Understand these instructions.  Will watch your condition.  Will get help right away if you are not doing well or get worse. Document Released: 11/16/2002 Document Revised: 04/12/2014 Document Reviewed: 08/03/2012 [facility] Patient Information 2015 [place], [place]. This information is not  intended to replace advice given to you by your health care provider. Make sure you discuss any questions you have with your health care provider.

## 2015-05-03 ENCOUNTER — Emergency Department (HOSPITAL_COMMUNITY): Payer: Medicare Other

## 2015-05-03 ENCOUNTER — Encounter (HOSPITAL_COMMUNITY): Payer: Self-pay | Admitting: Emergency Medicine

## 2015-05-03 ENCOUNTER — Telehealth: Payer: Self-pay

## 2015-05-03 ENCOUNTER — Inpatient Hospital Stay (HOSPITAL_COMMUNITY)
Admission: EM | Admit: 2015-05-03 | Discharge: 2015-05-06 | DRG: 372 | Disposition: A | Discharge status: Home / Self Care | Payer: Medicare Other | Attending: Internal Medicine | Admitting: Internal Medicine

## 2015-05-03 DIAGNOSIS — E876 Hypokalemia: Secondary | ICD-10-CM

## 2015-05-03 DIAGNOSIS — A047 Enterocolitis due to Clostridium difficile: Secondary | ICD-10-CM | POA: Diagnosis not present

## 2015-05-03 DIAGNOSIS — I10 Essential (primary) hypertension: Secondary | ICD-10-CM | POA: Diagnosis not present

## 2015-05-03 DIAGNOSIS — E860 Dehydration: Secondary | ICD-10-CM

## 2015-05-03 DIAGNOSIS — Z8711 Personal history of peptic ulcer disease: Secondary | ICD-10-CM | POA: Diagnosis not present

## 2015-05-03 DIAGNOSIS — N179 Acute kidney failure, unspecified: Secondary | ICD-10-CM | POA: Diagnosis present

## 2015-05-03 DIAGNOSIS — Z90710 Acquired absence of both cervix and uterus: Secondary | ICD-10-CM | POA: Diagnosis not present

## 2015-05-03 DIAGNOSIS — N183 Chronic kidney disease, stage 3 (moderate): Secondary | ICD-10-CM | POA: Diagnosis not present

## 2015-05-03 DIAGNOSIS — R197 Diarrhea, unspecified: Secondary | ICD-10-CM | POA: Diagnosis not present

## 2015-05-03 DIAGNOSIS — Z87891 Personal history of nicotine dependence: Secondary | ICD-10-CM | POA: Diagnosis not present

## 2015-05-03 DIAGNOSIS — G43909 Migraine, unspecified, not intractable, without status migrainosus: Secondary | ICD-10-CM | POA: Diagnosis present

## 2015-05-03 DIAGNOSIS — E871 Hypo-osmolality and hyponatremia: Secondary | ICD-10-CM | POA: Diagnosis present

## 2015-05-03 DIAGNOSIS — I129 Hypertensive chronic kidney disease with stage 1 through stage 4 chronic kidney disease, or unspecified chronic kidney disease: Secondary | ICD-10-CM | POA: Diagnosis present

## 2015-05-03 DIAGNOSIS — E875 Hyperkalemia: Secondary | ICD-10-CM | POA: Diagnosis present

## 2015-05-03 DIAGNOSIS — Z7982 Long term (current) use of aspirin: Secondary | ICD-10-CM | POA: Diagnosis not present

## 2015-05-03 DIAGNOSIS — E785 Hyperlipidemia, unspecified: Secondary | ICD-10-CM | POA: Diagnosis present

## 2015-05-03 DIAGNOSIS — N189 Chronic kidney disease, unspecified: Secondary | ICD-10-CM | POA: Insufficient documentation

## 2015-05-03 DIAGNOSIS — Z9049 Acquired absence of other specified parts of digestive tract: Secondary | ICD-10-CM | POA: Diagnosis present

## 2015-05-03 DIAGNOSIS — Z86010 Personal history of colonic polyps: Secondary | ICD-10-CM | POA: Diagnosis not present

## 2015-05-03 DIAGNOSIS — D631 Anemia in chronic kidney disease: Secondary | ICD-10-CM | POA: Diagnosis present

## 2015-05-03 DIAGNOSIS — Z888 Allergy status to other drugs, medicaments and biological substances status: Secondary | ICD-10-CM | POA: Diagnosis not present

## 2015-05-03 DIAGNOSIS — F329 Major depressive disorder, single episode, unspecified: Secondary | ICD-10-CM | POA: Diagnosis present

## 2015-05-03 LAB — CBC WITH DIFFERENTIAL/PLATELET
Basophils Absolute: 0 10*3/uL (ref 0.0–0.1)
Basophils Relative: 0 % (ref 0–1)
Eosinophils Absolute: 0.2 10*3/uL (ref 0.0–0.7)
Eosinophils Relative: 2 % (ref 0–5)
HCT: 29.9 % — ABNORMAL LOW (ref 36.0–46.0)
Hemoglobin: 10.3 g/dL — ABNORMAL LOW (ref 12.0–15.0)
LYMPHS ABS: 1 10*3/uL (ref 0.7–4.0)
LYMPHS PCT: 11 % — AB (ref 12–46)
MCH: 29.9 pg (ref 26.0–34.0)
MCHC: 34.4 g/dL (ref 30.0–36.0)
MCV: 86.9 fL (ref 78.0–100.0)
Monocytes Absolute: 1.9 10*3/uL — ABNORMAL HIGH (ref 0.1–1.0)
Monocytes Relative: 21 % — ABNORMAL HIGH (ref 3–12)
NEUTROS ABS: 5.9 10*3/uL (ref 1.7–7.7)
Neutrophils Relative %: 66 % (ref 43–77)
Platelets: 247 10*3/uL (ref 150–400)
RBC: 3.44 MIL/uL — ABNORMAL LOW (ref 3.87–5.11)
RDW: 14.3 % (ref 11.5–15.5)
WBC: 9 10*3/uL (ref 4.0–10.5)

## 2015-05-03 LAB — BASIC METABOLIC PANEL
Anion gap: 12 (ref 5–15)
BUN: 14 mg/dL (ref 6–20)
CALCIUM: 8.5 mg/dL — AB (ref 8.9–10.3)
CO2: 20 mmol/L — AB (ref 22–32)
CREATININE: 1.34 mg/dL — AB (ref 0.44–1.00)
Chloride: 100 mmol/L — ABNORMAL LOW (ref 101–111)
GFR calc Af Amer: 42 mL/min — ABNORMAL LOW (ref >60)
GFR, EST NON AFRICAN AMERICAN: 37 mL/min — AB (ref >60)
GLUCOSE: 123 mg/dL — AB (ref 65–99)
Potassium: 3.2 mmol/L — ABNORMAL LOW (ref 3.5–5.1)
Sodium: 132 mmol/L — ABNORMAL LOW (ref 135–145)

## 2015-05-03 LAB — CLOSTRIDIUM DIFFICILE BY PCR: Toxigenic C. Difficile by PCR: POSITIVE — AB

## 2015-05-03 LAB — I-STAT CG4 LACTIC ACID, ED: LACTIC ACID, VENOUS: 0.98 mmol/L (ref 0.5–2.0)

## 2015-05-03 MED ORDER — ENOXAPARIN SODIUM 40 MG/0.4ML ~~LOC~~ SOLN
40.0000 mg | SUBCUTANEOUS | Status: DC
  Administered 2015-05-03 – 2015-05-05 (×3): 40 mg via SUBCUTANEOUS
  Filled 2015-05-03 (×4): qty 0.4

## 2015-05-03 MED ORDER — VANCOMYCIN 50 MG/ML ORAL SOLUTION
125.0000 mg | Freq: Four times a day (QID) | ORAL | Status: DC
  Administered 2015-05-03 – 2015-05-05 (×10): 125 mg via ORAL
  Filled 2015-05-03 (×14): qty 2.5

## 2015-05-03 MED ORDER — ASPIRIN 81 MG PO CHEW
81.0000 mg | CHEWABLE_TABLET | Freq: Every day | ORAL | Status: DC
  Administered 2015-05-04 – 2015-05-05 (×2): 81 mg via ORAL
  Filled 2015-05-03 (×3): qty 1

## 2015-05-03 MED ORDER — ONDANSETRON HCL 4 MG/2ML IJ SOLN: 4.0000 mg | Freq: Four times a day (QID) | INTRAMUSCULAR | Status: DC | PRN

## 2015-05-03 MED ORDER — SERTRALINE HCL 50 MG PO TABS
50.0000 mg | ORAL_TABLET | Freq: Every day | ORAL | Status: DC
  Administered 2015-05-04 – 2015-05-05 (×2): 50 mg via ORAL
  Filled 2015-05-03 (×3): qty 1

## 2015-05-03 MED ORDER — CYCLOSPORINE 0.05 % OP EMUL
1.0000 [drp] | Freq: Two times a day (BID) | OPHTHALMIC | Status: DC
  Administered 2015-05-03 – 2015-05-05 (×4): 1 [drp] via OPHTHALMIC
  Filled 2015-05-03 (×8): qty 1

## 2015-05-03 MED ORDER — ONDANSETRON HCL 4 MG PO TABS
4.0000 mg | ORAL_TABLET | Freq: Four times a day (QID) | ORAL | Status: DC | PRN
  Administered 2015-05-04: 4 mg via ORAL
  Filled 2015-05-03: qty 1

## 2015-05-03 MED ORDER — RIFAXIMIN 200 MG PO TABS: 400.0000 mg | ORAL_TABLET | Freq: Three times a day (TID) | ORAL | Status: DC

## 2015-05-03 MED ORDER — POTASSIUM CHLORIDE CRYS ER 20 MEQ PO TBCR
40.0000 meq | EXTENDED_RELEASE_TABLET | Freq: Once | ORAL | Status: AC
  Administered 2015-05-03: 40 meq via ORAL
  Filled 2015-05-03 (×2): qty 2

## 2015-05-03 MED ORDER — ONDANSETRON HCL 4 MG/2ML IJ SOLN
4.0000 mg | Freq: Once | INTRAMUSCULAR | Status: AC
  Administered 2015-05-03: 4 mg via INTRAVENOUS
  Filled 2015-05-03: qty 2

## 2015-05-03 MED ORDER — AMLODIPINE BESYLATE 5 MG PO TABS
5.0000 mg | ORAL_TABLET | Freq: Every day | ORAL | Status: DC
  Administered 2015-05-04 – 2015-05-05 (×2): 5 mg via ORAL
  Filled 2015-05-03 (×3): qty 1

## 2015-05-03 MED ORDER — SODIUM CHLORIDE 0.9 % IV BOLUS (SEPSIS)
500.0000 mL | Freq: Once | INTRAVENOUS | Status: AC
  Administered 2015-05-03: 500 mL via INTRAVENOUS

## 2015-05-03 MED ORDER — HYDRALAZINE HCL 25 MG PO TABS
25.0000 mg | ORAL_TABLET | Freq: Three times a day (TID) | ORAL | Status: DC
  Administered 2015-05-03 – 2015-05-05 (×7): 25 mg via ORAL
  Filled 2015-05-03 (×11): qty 1

## 2015-05-03 MED ORDER — SACCHAROMYCES BOULARDII 250 MG PO CAPS
250.0000 mg | ORAL_CAPSULE | Freq: Two times a day (BID) | ORAL | Status: DC
  Administered 2015-05-03 – 2015-05-05 (×5): 250 mg via ORAL
  Filled 2015-05-03 (×7): qty 1

## 2015-05-03 MED ORDER — DONEPEZIL HCL 5 MG PO TABS
5.0000 mg | ORAL_TABLET | Freq: Every day | ORAL | Status: DC
  Administered 2015-05-03 – 2015-05-05 (×3): 5 mg via ORAL
  Filled 2015-05-03 (×4): qty 1

## 2015-05-03 MED ORDER — POTASSIUM CHLORIDE IN NACL 20-0.9 MEQ/L-% IV SOLN
INTRAVENOUS | Status: DC
  Administered 2015-05-03 – 2015-05-04 (×2): via INTRAVENOUS
  Filled 2015-05-03 (×3): qty 1000

## 2015-05-03 MED ORDER — METOPROLOL TARTRATE 25 MG PO TABS
25.0000 mg | ORAL_TABLET | Freq: Two times a day (BID) | ORAL | Status: DC
  Administered 2015-05-03 – 2015-05-05 (×5): 25 mg via ORAL
  Filled 2015-05-03 (×7): qty 1

## 2015-05-03 NOTE — ED Notes (Signed)
Pt recently hospitalized for diverticulitis- acquired c-diff. Was treated fully with Vancomycin and Flagyl x1. Felt better recently but started having diarrhea yesterday. Has had 7-8 occurences today of diarrhea. Has also been vomiting. Says this is exactly like when she had C-diff last time. No other c/c.

## 2015-05-03 NOTE — H&P (Signed)
PCP:   Unice Cobble, MD   Chief Complaint:  Diarrhea  HPI:  81 year old female who  has a past medical history of PUD (peptic ulcer disease) (1987); Hepatic cyst; Migraines; Hypertension; Hyperlipemia; Colonic polyp; Diverticulosis; Helicobacter pylori gastritis (1987); C. difficile colitis; and Acute kidney injury. Today presents to the hospital with chief complaint of diarrhea for past 3 days. Patient was initially diagnosed with diverticulitis in April at that time she was treated with Cipro and Flagyl, 2 days later patient was admitted with C. difficile colitis and discharged on vancomycin on 04/10/2015. Patient completed the course of vancomycin for 2 weeks. And again started having diarrhea 3 days ago. Patient had nausea but no vomiting. She was having 3-4 loose bowel movements everyday. She denies fever, no dysuria no chest pain no shortness of breath.  In the ED patient found to have acute kidney injury, hyperkalemia with potassium 3.2. Stool sample has been sent for C. difficile PCR, the results are pending at this time.  Allergies:   Allergies  Allergen Reactions  . Mavik [Trandolapril]     02/22/15 cough      Past Medical History  Diagnosis Date  . PUD (peptic ulcer disease) 1987    with h pylori  . Hepatic cyst   . Migraines   . Hypertension   . Hyperlipemia   . Colonic polyp   . Diverticulosis   . Helicobacter pylori gastritis 1987  . C. difficile colitis   . Acute kidney injury     Past Surgical History  Procedure Laterality Date  . Abdominal hysterectomy      BSO ; endometriosis; ? Appendectomy incidentally  . Cholecystectomy  2008  . Rotator cuff repair      right  . Colonoscopy  2003 & 2012    polyps; Dr Collene Mares  . Endoscopy gastritis  2008  . Tonsillectomy and adenoidectomy      Prior to Admission medications   Medication Sig Start Date End Date Taking? Authorizing Provider  amLODipine (NORVASC) 5 MG tablet Take 1 tablet (5 mg total) by mouth  daily. 04/01/15  Yes Venetia Maxon Rama, MD  aspirin 81 MG tablet Take 81 mg by mouth daily.   Yes Historical Provider, MD  Coenzyme Q10 300 MG CAPS Take 1 capsule by mouth daily.    Yes Historical Provider, MD  cyanocobalamin 500 MCG tablet Take 500 mcg by mouth daily.   Yes Historical Provider, MD  cycloSPORINE (RESTASIS) 0.05 % ophthalmic emulsion Place 1 drop into both eyes 2 (two) times daily.    Yes Historical Provider, MD  docusate sodium (COLACE) 100 MG capsule Take 100 mg by mouth 2 (two) times daily.   Yes Historical Provider, MD  donepezil (ARICEPT) 5 MG tablet Take 1 tablet (5 mg total) by mouth at bedtime. 03/21/15  Yes Hendricks Limes, MD  hydrALAZINE (APRESOLINE) 25 MG tablet TAKE 1 TABLET (25 MG TOTAL) BY MOUTH 3 (THREE) TIMES DAILY. Patient taking differently: 25 mg by mouth 3 times daily. 02/14/15  Yes Hendricks Limes, MD  LORazepam (ATIVAN) 0.5 MG tablet Take 1 tablet (0.5 mg total) by mouth at bedtime as needed for anxiety or sleep. 04/01/15  Yes Christina P Rama, MD  losartan (COZAAR) 100 MG tablet Take 100 mg by mouth daily. 02/22/15  Yes Historical Provider, MD  metoprolol tartrate (LOPRESSOR) 25 MG tablet Take 1 tablet (25 mg total) by mouth 2 (two) times daily. 06/03/14  Yes Hendricks Limes, MD  omeprazole ([place]) 20  MG capsule Take 1 capsule by mouth daily as needed (heartburn).  10/13/13  Yes Historical Provider, MD  Probiotic Product (RESTORA) CAPS Take 1 capsule by mouth daily.    Yes Historical Provider, MD  sertraline (ZOLOFT) 50 MG tablet Take 1 tablet (50 mg total) by mouth daily. 08/18/14  Yes Hendricks Limes, MD  nystatin (MYCOSTATIN) 100000 UNIT/ML suspension Take 5 mLs (500,000 Units total) by mouth 4 (four) times daily. Patient not taking: Reported on 05/03/2015 04/01/15   Venetia Maxon Rama, MD  vancomycin (VANCOCIN HCL) 125 MG capsule Take 1 capsule (125 mg total) by mouth 4 (four) times daily. Patient not taking: Reported on 05/03/2015 04/10/15   Verlee Monte, MD     Social History:  reports that she quit smoking about 41 years ago. She has never used smokeless tobacco. She reports that she drinks alcohol. She reports that she does not use illicit drugs.  Family History  Problem Relation Age of Onset  . Asthma Brother   . Hypertension Father   . Heart attack Father 5  . Leukemia Mother   . Stomach cancer Maternal Aunt   . Breast cancer Sister   . Thyroid disease Sister   . Stroke Neg Hx   . Diabetes Neg Hx      All the positives are listed in BOLD  Review of Systems:  HEENT: Headache, blurred vision, runny nose, sore throat Neck: Hypothyroidism, hyperthyroidism,,lymphadenopathy Chest : Shortness of breath, history of COPD, Asthma Heart : Chest pain, history of coronary arterey disease GI:  Nausea, vomiting, diarrhea, constipation, GERD GU: Dysuria, urgency, frequency of urination, hematuria Neuro: Stroke, seizures, syncope Psych: Depression, anxiety, hallucinations   Physical Exam: Blood pressure 112/56, pulse 91, temperature 99.8 F (37.7 C), temperature source Oral, resp. rate 17, SpO2 100 %. Constitutional:   Patient is a well-developed and well-nourished female in no acute distress and cooperative with exam. Head: Normocephalic and atraumatic Mouth: Mucus membranes moist Eyes: PERRL, EOMI, conjunctivae normal Neck: Supple, No Thyromegaly Cardiovascular: RRR, S1 normal, S2 normal Pulmonary/Chest: CTAB, no wheezes, rales, or rhonchi Abdominal: Soft. Non-tender, non-distended, bowel sounds are normal, no masses, organomegaly, or guarding present.  Neurological: A&O x3, Strength is normal and symmetric bilaterally, cranial nerve II-XII are grossly intact, no focal motor deficit, sensory intact to light touch bilaterally.  Extremities : No Cyanosis, Clubbing or Edema  Labs on Admission:  Basic Metabolic Panel:  Recent Labs Lab 05/01/15 1130 05/03/15 1406  NA 135 132*  K 3.1* 3.2*  CL 102 100*  CO2 21* 20*  GLUCOSE 110*  123*  BUN 14 14  CREATININE 1.28* 1.34*  CALCIUM 9.0 8.5*   Liver Function Tests:  Recent Labs Lab 05/01/15 1130  AST 16  ALT 13*  ALKPHOS 69  BILITOT 0.9  PROT 7.2  ALBUMIN 4.0    Recent Labs Lab 05/01/15 1130  LIPASE 30   No results for input(s): AMMONIA in the last 168 hours. CBC:  Recent Labs Lab 05/01/15 1130 05/03/15 1406  WBC 10.1 9.0  NEUTROABS 7.9* 5.9  HGB 11.7* 10.3*  HCT 34.5* 29.9*  MCV 89.1 86.9  PLT 280 247    Radiological Exams on Admission: Dg Abd 2 Views  05/03/2015   CLINICAL DATA:  Recent diarrhea following a bout of acquired C difficile  EXAM: ABDOMEN - 2 VIEW  COMPARISON:  04/05/2015  FINDINGS: Scattered large and small bowel gas is noted. No obstructive changes are noted. Air-fluid levels are seen consistent with the given clinical history  of diarrhea. No free air is noted. No abnormal mass lesion or bony abnormality is seen.  IMPRESSION: Scattered air-fluid levels consistent with the given clinical history of diarrhea. No acute obstructive changes noted.   Electronically Signed   By: Inez Catalina M.D.   On: 05/03/2015 15:09     Assessment/Plan Active Problems:   Essential hypertension   Hyponatremia   Acute renal failure   Diarrhea   Hypokalemia  Diarrhea Likely recurrent C. difficile, will start the patient on vancomycin and rifaximin as per CT protocol for recurrent episode. Await final C. difficile PCR results, if negative above antibiotics can be discontinued. Will also start Florastor  250 mg twice a day  CKD stage III Patient's creatinine is 1.34, close to her baseline.  Hypokalemia Due to diarrhea, replace potassium and check BMP in a.m.  Hypertension Continue amlodipine, will hold Cozaar at this time Continue metoprolol  Depression Continue Zoloft 50 mg by mouth daily  DVT prophylaxis Lovenox  Code status: Full code  Family discussion: Admission, patients condition and plan of care including tests being ordered  have been discussed with the patient and *her husband at bedside who indicate understanding and agree with the plan and Code Status.   Time Spent on Admission: 60 min  [facility] Pager: [phone] 05/03/2015, 4:23 PM  If 7PM-7AM, please contact night-coverage  [email or web address]  Password [email or web address]

## 2015-05-03 NOTE — Telephone Encounter (Signed)
Called patient and advised her she needs to be seen by emergency room if diarrhea is continuous, if a reoccurence of cdiff, then patient needs iv antibiotics---dr hopper is advising patient to go to emerg room--i have called [facility] and talked with charge nurse to advise that patient will be coming from home per patient's request

## 2015-05-03 NOTE — Progress Notes (Signed)
EDCM spoke to patient and her husband Toni [name] at bedside [phone].  Patient admitted and discharged from hospital from 04/25 to 05/01.  Patient confirms her pcp is Dr. Linna Darner.  Patient reports she has seen her pcp since she has been discharged from the hospital.  Patient has received discharge follow up phone call.  Patient lives at home with her husband.  Patient does not have any home health services at this time.  Patient has no dme at home.  Patient reports she is able to complete her own ADL's at home without difficulty.  Patient reports she has not been eating and if she drinks something, she throws up.  Patient's husband reports patient's last does of vancomycin at home was around the 7th or 8th of May.  Patient reports she has been having diarrhea for about a week and a half now. Offered support to patient and her husband.  No further EDCM needs at this time.

## 2015-05-03 NOTE — ED Notes (Signed)
Pt, being sent by PCP, c/o diarrhea x 3 days.  Hx of C Diff and several recent admissions for same.  Pt was seen at [facility] x 2 days ago for same.

## 2015-05-03 NOTE — Telephone Encounter (Signed)
Patient was in hospital 3 weeks ago and diagnosed with cdiff---she was treated there and did get better until this past Sunday---uncontrolled diarrhea and vomiting started again and even after visit to hospital on Sunday, she is still having these symptoms continuous---patient is wanting to be seen by you today----please advise, i will call her back, thanks

## 2015-05-03 NOTE — Progress Notes (Signed)
Utilization Review completed.  Ade Stmarie RN CM  

## 2015-05-03 NOTE — ED Notes (Signed)
Spoke to dawn and pt can go at 1705...klj

## 2015-05-03 NOTE — ED Provider Notes (Signed)
CSN: [id number]     Arrival date & time 05/03/15  1251 History   First MD Initiated Contact with Patient 05/03/15 1338     Chief Complaint  Patient presents with  . C DIFF   . Diarrhea     (Consider location/radiation/quality/duration/timing/severity/associated sxs/prior Treatment) Patient is a 81 y.o. female presenting with diarrhea. The history is provided by the patient.  Diarrhea Associated symptoms: abdominal pain and myalgias   Associated symptoms: no headaches and no vomiting    patient presents with diarrhea. Has been treated for C. difficile colitis in around 2 weeks ago finished up her oral vancomycin. Around 3 days ago began to have diarrhea again. Seen in the ER 2 days ago and during the 2 hours in the ER did not have any further diarrhea. States that now she is having diarrhea 7-8 times a day. She states it is the same smell is when she had C. difficile. She states she now has nausea and has not been able to eat or drink much. No fevers. She had been on Cipro and Flagyl for diverticulitis. She's also having some crampy abdominal pain. States she had some slight amount of bleeding while she was wiping after going to the bathroom but has not had much blood in the stool.  Past Medical History  Diagnosis Date  . PUD (peptic ulcer disease) 1987    with h pylori  . Hepatic cyst   . Migraines   . Hypertension   . Hyperlipemia   . Colonic polyp   . Diverticulosis   . Helicobacter pylori gastritis 1987  . C. difficile colitis   . Acute kidney injury    Past Surgical History  Procedure Laterality Date  . Abdominal hysterectomy      BSO ; endometriosis; ? Appendectomy incidentally  . Cholecystectomy  2008  . Rotator cuff repair      right  . Colonoscopy  2003 & 2012    polyps; Dr Collene Mares  . Endoscopy gastritis  2008  . Tonsillectomy and adenoidectomy     Family History  Problem Relation Age of Onset  . Asthma Brother   . Hypertension Father   . Heart attack Father 53  .  Leukemia Mother   . Stomach cancer Maternal Aunt   . Breast cancer Sister   . Thyroid disease Sister   . Stroke Neg Hx   . Diabetes Neg Hx    History  Substance Use Topics  . Smoking status: Former Smoker    Quit date: 12/10/1973  . Smokeless tobacco: Never Used  . Alcohol Use: 0.0 oz/week    0 Standard drinks or equivalent per week     Comment:  socially   OB History    No data available     Review of Systems  Constitutional: Positive for appetite change and fatigue. Negative for activity change.  Eyes: Negative for pain.  Respiratory: Negative for chest tightness and shortness of breath.   Cardiovascular: Negative for chest pain and leg swelling.  Gastrointestinal: Positive for nausea, abdominal pain, diarrhea and anal bleeding. Negative for vomiting.  Genitourinary: Negative for flank pain.  Musculoskeletal: Positive for myalgias. Negative for back pain and neck stiffness.  Skin: Negative for rash.  Neurological: Negative for weakness, numbness and headaches.  Psychiatric/Behavioral: Negative for behavioral problems.      Allergies  Mavik  Home Medications   Prior to Admission medications   Medication Sig Start Date End Date Taking? Authorizing Provider  amLODipine (NORVASC) 5 MG  tablet Take 1 tablet (5 mg total) by mouth daily. 04/01/15  Yes Venetia Maxon Rama, MD  aspirin 81 MG tablet Take 81 mg by mouth daily.   Yes Historical Provider, MD  Coenzyme Q10 300 MG CAPS Take 1 capsule by mouth daily.    Yes Historical Provider, MD  cyanocobalamin 500 MCG tablet Take 500 mcg by mouth daily.   Yes Historical Provider, MD  cycloSPORINE (RESTASIS) 0.05 % ophthalmic emulsion Place 1 drop into both eyes 2 (two) times daily.    Yes Historical Provider, MD  docusate sodium (COLACE) 100 MG capsule Take 100 mg by mouth 2 (two) times daily.   Yes Historical Provider, MD  donepezil (ARICEPT) 5 MG tablet Take 1 tablet (5 mg total) by mouth at bedtime. 03/21/15  Yes Hendricks Limes,  MD  hydrALAZINE (APRESOLINE) 25 MG tablet TAKE 1 TABLET (25 MG TOTAL) BY MOUTH 3 (THREE) TIMES DAILY. Patient taking differently: 25 mg by mouth 3 times daily. 02/14/15  Yes Hendricks Limes, MD  LORazepam (ATIVAN) 0.5 MG tablet Take 1 tablet (0.5 mg total) by mouth at bedtime as needed for anxiety or sleep. 04/01/15  Yes Christina P Rama, MD  losartan (COZAAR) 100 MG tablet Take 100 mg by mouth daily. 02/22/15  Yes Historical Provider, MD  metoprolol tartrate (LOPRESSOR) 25 MG tablet Take 1 tablet (25 mg total) by mouth 2 (two) times daily. 06/03/14  Yes Hendricks Limes, MD  omeprazole (PRILOSEC) 20 MG capsule Take 1 capsule by mouth daily as needed (heartburn).  10/13/13  Yes Historical Provider, MD  Probiotic Product (RESTORA) CAPS Take 1 capsule by mouth daily.    Yes Historical Provider, MD  sertraline (ZOLOFT) 50 MG tablet Take 1 tablet (50 mg total) by mouth daily. 08/18/14  Yes Hendricks Limes, MD  nystatin (MYCOSTATIN) 100000 UNIT/ML suspension Take 5 mLs (500,000 Units total) by mouth 4 (four) times daily. Patient not taking: Reported on 05/03/2015 04/01/15   Venetia Maxon Rama, MD  vancomycin (VANCOCIN HCL) 125 MG capsule Take 1 capsule (125 mg total) by mouth 4 (four) times daily. Patient not taking: Reported on 05/03/2015 04/10/15   Verlee Monte, MD   BP 112/56 mmHg  Pulse 91  Temp(Src) 99.8 F (37.7 C) (Oral)  Resp 17  SpO2 100% Physical Exam  Constitutional: She appears well-developed.  HENT:  Head: Atraumatic.  Eyes: Conjunctivae are normal.  Neck: Neck supple.  Cardiovascular: Normal rate and regular rhythm.   Pulmonary/Chest: Effort normal.  Abdominal:  Mild tenderness with somewhat hyperactive bowel sounds.  Musculoskeletal: Normal range of motion.  Neurological: She is alert.  Skin: Skin is warm.    ED Course  Procedures (including critical care time) Labs Review Labs Reviewed  BASIC METABOLIC PANEL - Abnormal; Notable for the following:    Sodium 132 (*)    Potassium  3.2 (*)    Chloride 100 (*)    CO2 20 (*)    Glucose, Bld 123 (*)    Creatinine, Ser 1.34 (*)    Calcium 8.5 (*)    GFR calc non Af Amer 37 (*)    GFR calc Af Amer 42 (*)    All other components within normal limits  CBC WITH DIFFERENTIAL/PLATELET - Abnormal; Notable for the following:    RBC 3.44 (*)    Hemoglobin 10.3 (*)    HCT 29.9 (*)    Lymphocytes Relative 11 (*)    Monocytes Relative 21 (*)    Monocytes Absolute 1.9 (*)  All other components within normal limits  CLOSTRIDIUM DIFFICILE BY PCR  I-STAT CG4 LACTIC ACID, ED    Imaging Review Dg Abd 2 Views  05/03/2015   CLINICAL DATA:  Recent diarrhea following a bout of acquired C difficile  EXAM: ABDOMEN - 2 VIEW  COMPARISON:  04/05/2015  FINDINGS: Scattered large and small bowel gas is noted. No obstructive changes are noted. Air-fluid levels are seen consistent with the given clinical history of diarrhea. No free air is noted. No abnormal mass lesion or bony abnormality is seen.  IMPRESSION: Scattered air-fluid levels consistent with the given clinical history of diarrhea. No acute obstructive changes noted.   Electronically Signed   By: Inez Catalina M.D.   On: 05/03/2015 15:09     EKG Interpretation None      MDM   Final diagnoses:  Diarrhea  Dehydration    Patient with diarrhea. History of C. difficile. Has been on oral vancomycin. Now has nausea and vomiting. Somewhat dehydrated. Will admit to internal medicine.    Davonna Belling, MD 05/03/15 [id number]

## 2015-05-04 ENCOUNTER — Ambulatory Visit: Payer: Medicare Other | Admitting: Internal Medicine

## 2015-05-04 DIAGNOSIS — I10 Essential (primary) hypertension: Secondary | ICD-10-CM

## 2015-05-04 DIAGNOSIS — E876 Hypokalemia: Secondary | ICD-10-CM

## 2015-05-04 DIAGNOSIS — A047 Enterocolitis due to Clostridium difficile: Principal | ICD-10-CM

## 2015-05-04 DIAGNOSIS — N183 Chronic kidney disease, stage 3 (moderate): Secondary | ICD-10-CM

## 2015-05-04 LAB — CBC
HCT: 32.3 % — ABNORMAL LOW (ref 36.0–46.0)
HEMOGLOBIN: 10.8 g/dL — AB (ref 12.0–15.0)
MCH: 29.3 pg (ref 26.0–34.0)
MCHC: 33.4 g/dL (ref 30.0–36.0)
MCV: 87.8 fL (ref 78.0–100.0)
PLATELETS: 303 10*3/uL (ref 150–400)
RBC: 3.68 MIL/uL — ABNORMAL LOW (ref 3.87–5.11)
RDW: 14.5 % (ref 11.5–15.5)
WBC: 6.8 10*3/uL (ref 4.0–10.5)

## 2015-05-04 LAB — BASIC METABOLIC PANEL
ANION GAP: 11 (ref 5–15)
BUN: 15 mg/dL (ref 6–20)
CHLORIDE: 105 mmol/L (ref 101–111)
CO2: 19 mmol/L — ABNORMAL LOW (ref 22–32)
CREATININE: 1.2 mg/dL — AB (ref 0.44–1.00)
Calcium: 8.7 mg/dL — ABNORMAL LOW (ref 8.9–10.3)
GFR calc Af Amer: 48 mL/min — ABNORMAL LOW (ref >60)
GFR, EST NON AFRICAN AMERICAN: 42 mL/min — AB (ref >60)
Glucose, Bld: 140 mg/dL — ABNORMAL HIGH (ref 65–99)
Potassium: 3.1 mmol/L — ABNORMAL LOW (ref 3.5–5.1)
SODIUM: 135 mmol/L (ref 135–145)

## 2015-05-04 MED ORDER — OXYCODONE-ACETAMINOPHEN 5-325 MG PO TABS
1.0000 | ORAL_TABLET | Freq: Four times a day (QID) | ORAL | Status: DC | PRN
Start: 2015-05-04 — End: 2015-05-06
  Administered 2015-05-04: 1 via ORAL
  Filled 2015-05-04: qty 1

## 2015-05-04 MED ORDER — POTASSIUM CHLORIDE 10 MEQ/100ML IV SOLN
10.0000 meq | INTRAVENOUS | Status: DC
  Filled 2015-05-04 (×3): qty 100

## 2015-05-04 MED ORDER — SODIUM CHLORIDE 0.9 % IV SOLN
INTRAVENOUS | Status: DC
Start: 2015-05-04 — End: 2015-05-06
  Administered 2015-05-04 – 2015-05-05 (×2): via INTRAVENOUS

## 2015-05-04 MED ORDER — POTASSIUM CHLORIDE CRYS ER 20 MEQ PO TBCR
40.0000 meq | EXTENDED_RELEASE_TABLET | Freq: Once | ORAL | Status: AC
  Administered 2015-05-04: 40 meq via ORAL
  Filled 2015-05-04: qty 2

## 2015-05-04 NOTE — Progress Notes (Addendum)
Patient ID: Toni [name], female   DOB: 1934-08-12, 81 y.o.   MRN: [id number] [facility] PROGRESS NOTE  Toni [name] HYI:[id number] DOB: 1934-06-16 DOA: 05/03/2015 PCP: Unice Cobble, MD  Brief narrative:    81 year old female with past medical history of peptic ulcer disease, hypertension, C.diff colitis, CKD stage 3. Patient presented to [facility] with reports of ongoing diarrhea for past 3 days prior to this admission. Her additional history significant for hospitalization in April 2016 for diverticulitis at which time patient was treated with Cipro and Flagyl and then subsequently developed C. difficile colitis. She was discharged with a vancomycin on 04/10/15 and she completed this after taking it for 2 weeks. Her diarrhea subsided and then came back about 3 days prior to this admission.  Barrier to discharge: Diarrhea subsided. We still have to replete electrolytes which includes potassium. We will continue IV fluids for hydration. If she feels better anticipate discharge 05/05/2015.  Assessment/Plan:    Principal problem: Recurrence of C. difficile colitis - Patient with C. difficile colitis in April 2016 and again on this admission. - Continue vancomycin by mouth and rifaximin - No fevers. White blood cell count is within normal limits. - Diarrhea subsided. One bowel movement this morning. - Continue supportive care with IV fluids until by mouth intake improves. - Replete electrolytes.   Active Problems: Essential hypertension - Continue Norvasc 5 mg daily, hydralazine 25 mg every 8 hours and metoprolol 25 mg twice daily  Hypokalemia - Secondary to GI losses, diarrhea. - Being supplemented. - Follow-up BMP in the morning  Chronic kidney disease, stage III - Patient has baseline creatinine of 5.61 back in April 2016. - Creatinine on this admission is significantly better at 1.28.  Hyponatremia - Likely secondary to GI losses, dehydration -  Sodium normalized with IV fluids  Anemia of chronic kidney disease - Hemoglobin is 10.8 this morning. Stable. - No reports of bleeding. - No indications for transfusion.    DVT Prophylaxis  - Lovenox sub Q ordered    Code Status: Full.  Family Communication:  plan of care discussed with the patient and her husband at the bedside  Disposition Plan: Home in next 24 hours if she feels better. Diarrhea now stopped. Last BM this am x once.  IV access:  Peripheral IV  Procedures and diagnostic studies:    Dg Abd 2 Views 05/03/2015  Scattered air-fluid levels consistent with the given clinical history of diarrhea. No acute obstructive changes noted.   Medical Consultants:  None   Other Consultants:  None   IAnti-Infectives:   Vanco PO 05/03/2015 -->    Delmore Sear, MD  [facility] Pager [phone]  Time spent in minutes: 25 minutes  If 7PM-7AM, please contact night-coverage [email or web address] Password [email or web address] 05/04/2015, 11:12 AM   LOS: 1 day    HPI/Subjective: No acute overnight events. Patient reports nausea but no vomiting.   Objective: Filed Vitals:   05/03/15 1806 05/03/15 2055 05/04/15 0543 05/04/15 0955  BP: 123/61 124/55 116/54 115/52  Pulse: 87 88 77 82  Temp: 98.6 F (37 C) 98.8 F (37.1 C) 98.3 F (36.8 C) 98.5 F (36.9 C)  TempSrc: Oral Oral Oral   Resp: 18 16  17   Height:      Weight:      SpO2: 100% 97% 98% 100%    Intake/Output Summary (Last 24 hours) at 05/04/15 1112 Last data filed at 05/04/15 0900  Gross per 24 hour  Intake 1356.67 ml  Output    500 ml  Net 856.67 ml    Exam:   General:  Pt is alert, follows commands appropriately, not in acute distress  Cardiovascular: Regular rate and rhythm, S1/S2 (+)  Respiratory: Clear to auscultation bilaterally, no wheezing, no crackles, no rhonchi  Abdomen: Soft, non tender, non distended, bowel sounds present  Extremities: No edema, pulses DP and PT palpable bilaterally  Neuro:  Grossly nonfocal  Data Reviewed: Basic Metabolic Panel:  Recent Labs Lab 05/01/15 1130 05/03/15 1406 05/04/15 0959  NA 135 132* 135  K 3.1* 3.2* 3.1*  CL 102 100* 105  CO2 21* 20* 19*  GLUCOSE 110* 123* 140*  BUN 14 14 15   CREATININE 1.28* 1.34* 1.20*  CALCIUM 9.0 8.5* 8.7*   Liver Function Tests:  Recent Labs Lab 05/01/15 1130  AST 16  ALT 13*  ALKPHOS 69  BILITOT 0.9  PROT 7.2  ALBUMIN 4.0    Recent Labs Lab 05/01/15 1130  LIPASE 30   No results for input(s): AMMONIA in the last 168 hours. CBC:  Recent Labs Lab 05/01/15 1130 05/03/15 1406 05/04/15 0959  WBC 10.1 9.0 6.8  NEUTROABS 7.9* 5.9  --   HGB 11.7* 10.3* 10.8*  HCT 34.5* 29.9* 32.3*  MCV 89.1 86.9 87.8  PLT 280 247 303   Cardiac Enzymes: No results for input(s): CKTOTAL, CKMB, CKMBINDEX, TROPONINI in the last 168 hours. BNP: Invalid input(s): POCBNP CBG: No results for input(s): GLUCAP in the last 168 hours.  Clostridium Difficile by PCR     Status: Abnormal   Collection Time: 05/03/15  3:33 PM  Result Value Ref Range Status   C difficile by pcr POSITIVE (A) NEGATIVE Final     Scheduled Meds: . amLODipine  5 mg Oral Daily  . aspirin  81 mg Oral Daily  . cycloSPORINE  1 drop Both Eyes BID  . donepezil  5 mg Oral QHS  . enoxaparin (LOVENOX)   40 mg Subcutaneous Q24H  . hydrALAZINE  25 mg Oral 3 times per day  . metoprolol tartrate  25 mg Oral BID  . potassium chloride  10 mEq Intravenous Q1 Hr x 3  . vancomycin  125 mg Oral QID   Followed by  .  rifaximin  400 mg Oral TID  . saccharomyces   250 mg Oral BID  . sertraline  50 mg Oral Daily   Continuous Infusions: . 0.9 % NaCl with KCl 20 mEq / L 75 mL/hr at 05/04/15 0945

## 2015-05-05 DIAGNOSIS — N189 Chronic kidney disease, unspecified: Secondary | ICD-10-CM | POA: Insufficient documentation

## 2015-05-05 LAB — BASIC METABOLIC PANEL
ANION GAP: 8 (ref 5–15)
BUN: 10 mg/dL (ref 6–20)
CO2: 20 mmol/L — AB (ref 22–32)
Calcium: 8.3 mg/dL — ABNORMAL LOW (ref 8.9–10.3)
Chloride: 107 mmol/L (ref 101–111)
Creatinine, Ser: 1.04 mg/dL — ABNORMAL HIGH (ref 0.44–1.00)
GFR calc Af Amer: 58 mL/min — ABNORMAL LOW (ref >60)
GFR calc non Af Amer: 50 mL/min — ABNORMAL LOW (ref >60)
Glucose, Bld: 103 mg/dL — ABNORMAL HIGH (ref 65–99)
POTASSIUM: 3.7 mmol/L (ref 3.5–5.1)
Sodium: 135 mmol/L (ref 135–145)

## 2015-05-05 MED ORDER — LORAZEPAM 0.5 MG PO TABS: 0.5000 mg | ORAL_TABLET | Freq: Once | ORAL | Status: DC

## 2015-05-05 MED ORDER — POTASSIUM CHLORIDE CRYS ER 20 MEQ PO TBCR: 40.0000 meq | EXTENDED_RELEASE_TABLET | Freq: Once | ORAL | Status: DC

## 2015-05-05 NOTE — Progress Notes (Signed)
Patient ID: Toni [name], female   DOB: [birth year], 81 y.o.   MRN: [id number] [facility] PROGRESS NOTE  CHANTAL WORTHEY JEH:[id number] DOB: 02-23-1934 DOA: 05/03/2015 PCP: Unice Cobble, MD  Brief narrative:    81 year old female with past medical history of peptic ulcer disease, hypertension, C.diff colitis, CKD stage 3. Patient presented to [facility] with reports of ongoing diarrhea for past 3 days prior to this admission. Her additional history significant for hospitalization in April 2016 for diverticulitis at which time patient was treated with Cipro and Flagyl and then subsequently developed C. difficile colitis. She was discharged with a vancomycin on 04/10/15 and she completed this after taking it for 2 weeks. Her diarrhea subsided and then came back about 3 days prior to this admission.  Barrier to discharge: Had 1 episode of diarrhea this morning but in past 24 hours 9 episodes, quite contrary to yesterday morning when we thought the diarrhea subsided. We will continue to supplement electrolytes. BMP is pending this morning. Anticipated discharge 05/06/2015 if diarrhea subsides.  Assessment/Plan:    Principal problem: Recurrence of C. difficile colitis - Patient with C. difficile colitis in April 2016 and again on this admission. - Patient is on vancomycin and rifaximin. - Patient is hemodynamically stable with no fevers and good blood pressure. - Her white blood cell count is normal. - Continue supportive care with IV fluids for hydration. Continue to replete electrolytes. BMP is pending this morning.   Active Problems: Essential hypertension - Continue Norvasc 5 mg daily, hydralazine 25 mg every 8 hours and metoprolol 25 mg twice daily - Blood pressure is 105/45 this morning.  Hypokalemia - Secondary to GI losses, diarrhea. - Supplemented. - BMP is pending.  Chronic kidney disease, stage III - Patient has baseline creatinine of 5.61 back in April  2016. - Creatinine on this admission is significantly better at 1.28. - BMP is pending this morning  Hyponatremia - Likely secondary to GI losses, dehydration - Sodium normalized with fluids  Anemia of chronic kidney disease - Hemoglobin is 10.8 this morning. Stable.   DVT Prophylaxis  - Lovenox sub Q ordered while patient in hospital.   Code Status: Full.  Family Communication:  plan of care discussed with the patient and her husband at the bedside  Disposition Plan: Home likely 05/06/2015.  IV access:  Peripheral IV  Procedures and diagnostic studies:    Dg Abd 2 Views 05/03/2015  Scattered air-fluid levels consistent with the given clinical history of diarrhea. No acute obstructive changes noted.   Medical Consultants:  None   Other Consultants:  None   IAnti-Infectives:   Vanco PO 05/03/2015 -->    Jadae Steinke, MD  [facility] Pager [phone]  Time spent in minutes: 15 minutes  If 7PM-7AM, please contact night-coverage [email or web address] Password [email or web address] 05/05/2015, 10:21 AM   LOS: 2 days    HPI/Subjective: No acute overnight events. Patient reports feeling tired this morning. Had watery diarrhea this morning.  Objective: Filed Vitals:   05/04/15 0955 05/04/15 1401 05/04/15 2056 05/05/15 0529  BP: 115/52 115/51 109/58 105/45  Pulse: 82 84 87 81  Temp: 98.5 F (36.9 C) 97.6 F (36.4 C) 98.2 F (36.8 C) 98.3 F (36.8 C)  TempSrc:  Oral Oral Oral  Resp: 17 15 16 14   Height:      Weight:      SpO2: 100% 97% 99% 99%    Intake/Output Summary (Last 24 hours) at 05/05/15 1021 Last data filed at 05/05/15  1000  Gross per 24 hour  Intake 1382.5 ml  Output      0 ml  Net 1382.5 ml    Exam:   General:  Pt is alert, not in acute distress  Cardiovascular: RRR, S1/S2 (+)  Respiratory: bilateral air entry, no wheezing  Abdomen: Nontender, nondistended abdomen, appreciate bowel sounds  Extremities: No lower extremity swelling, pulses  palpable  Neuro: No focal deficits  Data Reviewed: Basic Metabolic Panel:  Recent Labs Lab 05/01/15 1130 05/03/15 1406 05/04/15 0959  NA 135 132* 135  K 3.1* 3.2* 3.1*  CL 102 100* 105  CO2 21* 20* 19*  GLUCOSE 110* 123* 140*  BUN 14 14 15   CREATININE 1.28* 1.34* 1.20*  CALCIUM 9.0 8.5* 8.7*   Liver Function Tests:  Recent Labs Lab 05/01/15 1130  AST 16  ALT 13*  ALKPHOS 69  BILITOT 0.9  PROT 7.2  ALBUMIN 4.0    Recent Labs Lab 05/01/15 1130  LIPASE 30   No results for input(s): AMMONIA in the last 168 hours. CBC:  Recent Labs Lab 05/01/15 1130 05/03/15 1406 05/04/15 0959  WBC 10.1 9.0 6.8  NEUTROABS 7.9* 5.9  --   HGB 11.7* 10.3* 10.8*  HCT 34.5* 29.9* 32.3*  MCV 89.1 86.9 87.8  PLT 280 247 303   Cardiac Enzymes: No results for input(s): CKTOTAL, CKMB, CKMBINDEX, TROPONINI in the last 168 hours. BNP: Invalid input(s): POCBNP CBG: No results for input(s): GLUCAP in the last 168 hours.  Clostridium Difficile by PCR     Status: Abnormal   Collection Time: 05/03/15  3:33 PM  Result Value Ref Range Status   C difficile by pcr POSITIVE (A) NEGATIVE Final     Scheduled Meds: . amLODipine  5 mg Oral Daily  . aspirin  81 mg Oral Daily  . cycloSPORINE  1 drop Both Eyes BID  . donepezil  5 mg Oral QHS  . enoxaparin (LOVENOX)   40 mg Subcutaneous Q24H  . hydrALAZINE  25 mg Oral 3 times per day  . metoprolol tartrate  25 mg Oral BID  . potassium chloride  10 mEq Intravenous Q1 Hr x 3  . vancomycin  125 mg Oral QID   Followed by  .  rifaximin  400 mg Oral TID  . saccharomyces   250 mg Oral BID  . sertraline  50 mg Oral Daily   Continuous Infusions: . sodium chloride 50 mL/hr at 05/04/15 1557

## 2015-05-06 ENCOUNTER — Telehealth: Payer: Self-pay | Admitting: *Deleted

## 2015-05-06 DIAGNOSIS — N179 Acute kidney failure, unspecified: Secondary | ICD-10-CM

## 2015-05-06 DIAGNOSIS — E871 Hypo-osmolality and hyponatremia: Secondary | ICD-10-CM

## 2015-05-06 MED ORDER — SACCHAROMYCES BOULARDII 250 MG PO CAPS: 250.0000 mg | ORAL_CAPSULE | Freq: Two times a day (BID) | ORAL | Status: DC

## 2015-05-06 MED ORDER — VANCOMYCIN 50 MG/ML ORAL SOLUTION: 125.0000 mg | Freq: Four times a day (QID) | ORAL | Status: DC

## 2015-05-06 MED ORDER — RIFAXIMIN 200 MG PO TABS: 400.0000 mg | ORAL_TABLET | Freq: Three times a day (TID) | ORAL | Status: DC

## 2015-05-06 NOTE — Progress Notes (Signed)
Discussed discharged instructions with patient. Patient understood instructions fully. Patient's vitals were stable at discharge and was tolerating diet well with no issues with loose stool or complaints of pain.

## 2015-05-06 NOTE — Telephone Encounter (Signed)
Called pt concerning tcm appt no answer x's 10 rings...Toni [name]

## 2015-05-06 NOTE — Discharge Instructions (Signed)
Clostridium Difficile Infection °Clostridium difficile (C. difficile) is a bacteria found in the intestinal tract or colon. Under certain conditions, it causes diarrhea and sometimes severe disease. The severe form of the disease is known as pseudomembranous colitis (often called C. difficile colitis). This disease can damage the lining of the colon or cause the colon to become enlarged (toxic megacolon). °CAUSES °Your colon normally contains many different bacteria, including C. difficile. The balance of bacteria in your colon can change during illness. This is especially true when you take antibiotic medicine. Taking antibiotics may allow the C. difficile to grow, multiply excessively, and make a toxin that then causes illness. The elderly and people with certain medical conditions have a greater risk of getting C. difficile infections. °SYMPTOMS °· Watery diarrhea. °· Fever. °· Fatigue. °· Loss of appetite. °· Nausea. °· Abdominal swelling, pain, or tenderness. °· Dehydration. °DIAGNOSIS °Your symptoms may make your caregiver suspect a C. difficile infection, especially if you have used antibiotics in the preceding weeks. However, there are only 2 ways to know for certain whether you have a C. difficile infection: °· A lab test that finds the toxin in your stool. °· The specific appearance of an abnormality (pseudomembrane) in your colon. This can only be seen by doing a sigmoidoscopy or colonoscopy. These procedures involve passing an instrument through your rectum to look at the inside of your colon. °Your caregiver will help determine if these tests are necessary. °TREATMENT °· Most people are successfully treated with one of two specific antibiotics, usually given by mouth. Other antibiotics you are receiving are stopped if possible. °· Intravenous (IV) fluids and correction of electrolyte imbalance may be necessary. °· Rarely, surgery may be needed to remove the infected part of the intestines. °· Careful  hand washing by you and your caregivers is important to prevent the spread of infection. In the hospital, your caregivers may also put on gowns and gloves to prevent the spread of the C. difficile bacteria. Your room is also cleaned regularly with a solution containing bleach or a product that is known to kill C. difficile. °HOME CARE INSTRUCTIONS °· Drink enough fluids to keep your urine clear or pale yellow. Avoid milk, caffeine, and alcohol. °· Ask your caregiver for specific rehydration instructions. °· Try eating small, frequent meals rather than large meals. °· Take your antibiotics as directed. Finish them even if you start to feel better. °· Do not use medicines to slow diarrhea. This could delay healing or cause complications. °· Wash your hands thoroughly after using the bathroom and before preparing food. °· Make sure people who live with you wash their hands often, too. °· Carefully disinfect all surfaces with a product that contains chlorine bleach. °SEEK MEDICAL CARE IF: °· Diarrhea persists longer than expected or recurs after completing your course of antibiotic treatment for the C. difficile infection. °· You have trouble staying hydrated. °SEEK IMMEDIATE MEDICAL CARE IF: °· You develop a new fever. °· You have increasing abdominal pain or tenderness. °· There is blood in your stools, or your stools are dark black and tarry. °· You cannot hold down food or liquids. °MAKE SURE YOU: °· Understand these instructions. °· Will watch your condition. °· Will get help right away if you are not doing well or get worse. °Document Released: 09/05/2005 Document Revised: 04/12/2014 Document Reviewed: 05/04/2011 °Exit[employer]® Patient Information ©2015 [employer]. This information is not intended to replace advice given to you by your health care provider. Make sure you   discuss any questions you have with your health care provider. ° °

## 2015-05-06 NOTE — Discharge Summary (Signed)
Physician Discharge Summary  Toni [name] OEV:[id number] DOB: 08/10/1934 DOA: 05/03/2015  PCP: Unice Cobble, MD  Admit date: 05/03/2015 Discharge date: 05/06/2015  Recommendations for Outpatient Follow-up:  1. Take vancomycin for 12 days on discharge for treatment of C. difficile colitis. Once you complete vancomycin then start rifaximin for 14 days.  Discharge Diagnoses:  Active Problems:   Essential hypertension   Hyponatremia   Acute renal failure   Diarrhea   Hypokalemia   CKD (chronic kidney disease)    Discharge Condition: stable   Diet recommendation: as tolerated   History of present illness:  81 year old female with past medical history of peptic ulcer disease, hypertension, C.diff colitis, CKD stage 3. Patient presented to [facility] long hospital with reports of ongoing diarrhea for past 3 days prior to this admission. Her additional history significant for hospitalization in April 2016 for diverticulitis at which time patient was treated with Cipro and Flagyl and then subsequently developed C. difficile colitis. She was discharged with a vancomycin on 04/10/15 and she completed this after taking it for 2 weeks. Her diarrhea subsided and then came back about 3 days prior to this admission.  Hospital Course:   Assessment/Plan:    Principal problem: Recurrence of C. difficile colitis - Patient with C. difficile colitis in April 2016 and again on this admission. - Patient was started on vancomycin by mouth. She will continue vancomycin for 12 days on discharge followed by rifaximin for 14 days. - Patient had one bowel movement - Patie since yesterday. - No fever and normal white blood cell count on this admission.   Active Problems: Essential hypertension - Continue Norvasc 5 mg daily, hydralazine 25 mg every 8 hours and metoprolol 25 mg twice daily  Hypokalemia - Secondary to GI losses, diarrhea. - Supplemented and WNL.  Chronic kidney disease, stage  III - Patient has baseline creatinine of 5.61 back in April 2016. - Creatinine on this admission is significantly better at 1.28 and 1.04 this am.  Hyponatremia - Likely secondary to GI losses, dehydration - Sodium normalized with fluids  Anemia of chronic kidney disease - Hemoglobin is 10.8 - Stable    DVT Prophylaxis  - Lovenox sub Q ordered in hospital    Code Status: Full.  Family Communication: plan of care discussed with the patient and her husband at the bedside    IV access:  Peripheral IV  Procedures and diagnostic studies:   Dg Abd 2 Views 05/03/2015 Scattered air-fluid levels consistent with the given clinical history of diarrhea. No acute obstructive changes noted.   Medical Consultants:  None   Other Consultants:  None   IAnti-Infectives:   Vanco PO 05/03/2015 --> for 12 days on discharge     Signed:  Leisa Lenz, MD  [facility] 05/06/2015, 9:34 AM  Pager #: [phone]  Time spent in minutes: less than 30 minutes    Discharge Exam: Filed Vitals:   05/06/15 0544  BP: 114/50  Pulse: 70  Temp: 98.3 F (36.8 C)  Resp: 16   Filed Vitals:   05/05/15 0529 05/05/15 1344 05/05/15 2142 05/06/15 0544  BP: 105/45 109/56 124/59 114/50  Pulse: 81 76 88 70  Temp: 98.3 F (36.8 C) 98.3 F (36.8 C) 97.7 F (36.5 C) 98.3 F (36.8 C)  TempSrc: Oral Oral Oral Oral  Resp: 14 16 16 16   Height:      Weight:      SpO2: 99% 100% 99% 99%    General: Pt is alert, follows  commands appropriately, not in acute distress Cardiovascular: Regular rate and rhythm, S1/S2 +, no murmurs Respiratory: Clear to auscultation bilaterally, no wheezing, no crackles, no rhonchi Abdominal: Soft, non tender, non distended, bowel sounds +, no guarding Extremities: no edema, no cyanosis, pulses palpable bilaterally DP and PT Neuro: Grossly nonfocal  Discharge Instructions  Discharge Instructions    Call MD for:  difficulty breathing,  headache or visual disturbances    Complete by:  As directed      Call MD for:  persistant nausea and vomiting    Complete by:  As directed      Call MD for:  severe uncontrolled pain    Complete by:  As directed      Diet - low sodium heart healthy    Complete by:  As directed      Discharge instructions    Complete by:  As directed   1. Take vancomycin for 12 days on discharge for treatment of C. difficile colitis. Once you complete vancomycin then start rifaximin for 14 days.     Increase activity slowly    Complete by:  As directed             Medication List    STOP taking these medications        nystatin 100000 UNIT/ML suspension  Commonly known as:  MYCOSTATIN     RESTORA Caps      TAKE these medications        amLODipine 5 MG tablet  Commonly known as:  NORVASC  Take 1 tablet (5 mg total) by mouth daily.     aspirin 81 MG tablet  Take 81 mg by mouth daily.     Coenzyme Q10 300 MG Caps  Take 1 capsule by mouth daily.     cyanocobalamin 500 MCG tablet  Take 500 mcg by mouth daily.     cycloSPORINE 0.05 % ophthalmic emulsion  Commonly known as:  RESTASIS  Place 1 drop into both eyes 2 (two) times daily.     docusate sodium 100 MG capsule  Commonly known as:  COLACE  Take 100 mg by mouth 2 (two) times daily.     donepezil 5 MG tablet  Commonly known as:  ARICEPT  Take 1 tablet (5 mg total) by mouth at bedtime.     hydrALAZINE 25 MG tablet  Commonly known as:  APRESOLINE  TAKE 1 TABLET (25 MG TOTAL) BY MOUTH 3 (THREE) TIMES DAILY.     LORazepam 0.5 MG tablet  Commonly known as:  ATIVAN  Take 1 tablet (0.5 mg total) by mouth at bedtime as needed for anxiety or sleep.     losartan 100 MG tablet  Commonly known as:  COZAAR  Take 100 mg by mouth daily.     metoprolol tartrate 25 MG tablet  Commonly known as:  LOPRESSOR  Take 1 tablet (25 mg total) by mouth 2 (two) times daily.     omeprazole 20 MG capsule  Commonly known as:  PRILOSEC  Take 1  capsule by mouth daily as needed (heartburn).     rifaximin 200 MG tablet  Commonly known as:  XIFAXAN  Take 2 tablets (400 mg total) by mouth 3 (three) times daily.  Start taking on:  05/17/2015     saccharomyces boulardii 250 MG capsule  Commonly known as:  FLORASTOR  Take 1 capsule (250 mg total) by mouth 2 (two) times daily.     sertraline 50 MG tablet  Commonly known  as:  ZOLOFT  Take 1 tablet (50 mg total) by mouth daily.     vancomycin 50 mg/mL oral solution  Commonly known as:  VANCOCIN  Take 2.5 mLs (125 mg total) by mouth 4 (four) times daily.           Follow-up Information    Schedule an appointment as soon as possible for a visit with Unice Cobble, MD.   Specialty:  Internal Medicine   Why:  Follow up appt after recent hospitalization   Contact information:   [address] [address] [phone]        The results of significant diagnostics from this hospitalization (including imaging, microbiology, ancillary and laboratory) are listed below for reference.    Significant Diagnostic Studies: Dg Abd 2 Views  05/03/2015   CLINICAL DATA:  Recent diarrhea following a bout of acquired C difficile  EXAM: ABDOMEN - 2 VIEW  COMPARISON:  04/05/2015  FINDINGS: Scattered large and small bowel gas is noted. No obstructive changes are noted. Air-fluid levels are seen consistent with the given clinical history of diarrhea. No free air is noted. No abnormal mass lesion or bony abnormality is seen.  IMPRESSION: Scattered air-fluid levels consistent with the given clinical history of diarrhea. No acute obstructive changes noted.   Electronically Signed   By: Inez Catalina M.D.   On: 05/03/2015 15:09    Microbiology: Recent Results (from the past 240 hour(s))  Clostridium Difficile by PCR     Status: Abnormal   Collection Time: 05/03/15  3:33 PM  Result Value Ref Range Status   C difficile by pcr POSITIVE (A) NEGATIVE Final    Comment: CRITICAL RESULT CALLED TO,  READ BACK BY AND VERIFIED WITH: Tsosie Billing [id number] @ [place]: Basic Metabolic Panel:  Recent Labs Lab 05/01/15 1130 05/03/15 1406 05/04/15 0959 05/05/15 1159  NA 135 132* 135 135  K 3.1* 3.2* 3.1* 3.7  CL 102 100* 105 107  CO2 21* 20* 19* 20*  GLUCOSE 110* 123* 140* 103*  BUN 14 14 15 10   CREATININE 1.28* 1.34* 1.20* 1.04*  CALCIUM 9.0 8.5* 8.7* 8.3*   Liver Function Tests:  Recent Labs Lab 05/01/15 1130  AST 16  ALT 13*  ALKPHOS 69  BILITOT 0.9  PROT 7.2  ALBUMIN 4.0    Recent Labs Lab 05/01/15 1130  LIPASE 30   No results for input(s): AMMONIA in the last 168 hours. CBC:  Recent Labs Lab 05/01/15 1130 05/03/15 1406 05/04/15 0959  WBC 10.1 9.0 6.8  NEUTROABS 7.9* 5.9  --   HGB 11.7* 10.3* 10.8*  HCT 34.5* 29.9* 32.3*  MCV 89.1 86.9 87.8  PLT 280 247 303   Cardiac Enzymes: No results for input(s): CKTOTAL, CKMB, CKMBINDEX, TROPONINI in the last 168 hours. BNP: BNP (last 3 results) No results for input(s): BNP in the last 8760 hours.  ProBNP (last 3 results) No results for input(s): PROBNP in the last 8760 hours.  CBG: No results for input(s): GLUCAP in the last 168 hours.

## 2015-05-06 NOTE — Care Management Note (Signed)
Case Management Note  Patient Details  Name: Toni [name] MRN: [id number] Date of Birth: August 29, 1934  Subjective/Objective:               Admitted with diarrhea      Action/Plan: Discharge planning  Expected Discharge Date:   (unknown)               Expected Discharge Plan:  Home/Self Care  In-House Referral:  NA  Discharge planning Services  CM Consult  Post Acute Care Choice:    Choice offered to:     DME Arranged:    DME Agency:     HH Arranged:    [place] Agency:     Status of Service:  Completed, signed off  Medicare Important Message Given:  Yes Date Medicare IM Given:  05/06/15 Medicare IM give by:  Sunday Spillers RN CM  Date Additional Medicare IM Given:    Additional Medicare Important Message give by:     If discussed at [place] of Stay Meetings, dates discussed:    Additional Comments:  Guadalupe Maple, RN 05/06/2015, 9:52 AM

## 2015-05-10 ENCOUNTER — Other Ambulatory Visit: Payer: Self-pay | Admitting: Internal Medicine

## 2015-05-10 NOTE — Telephone Encounter (Signed)
Called pt concerning tcm completed call below.../[facility] Transition Care Management Follow-up Telephone Call D/C 05/06/15  How have you been since you were released from the hospital? Pt states she is doing better   Do you understand why you were in the hospital? YES   Do you understand the discharge instrcutions? YES  Items Reviewed:  Medications reviewed: YES  Allergies reviewed: YES  Dietary changes reviewed: NO  Referrals reviewed: No referrals needed   Functional Questionnaire:   Activities of Daily Living (ADLs):   She states she are independent in the following: ambulation, bathing and hygiene, feeding, continence, grooming, toileting and dressing States she doesn't  require assistance    Any transportation issues/concerns?: NO   Any patient concerns? NO   Confirmed importance and date/time of follow-up visits scheduled: YES, pt had called already set appt for 05/13/15 w/Dr. Linna Darner. Advise pt to keep appt   Confirmed with patient if condition begins to worsen call PCP or go to the ER.

## 2015-05-13 ENCOUNTER — Encounter: Payer: Self-pay | Admitting: Internal Medicine

## 2015-05-13 ENCOUNTER — Telehealth: Payer: Self-pay | Admitting: Internal Medicine

## 2015-05-13 ENCOUNTER — Ambulatory Visit (INDEPENDENT_AMBULATORY_CARE_PROVIDER_SITE_OTHER): Payer: Medicare Other | Admitting: Internal Medicine

## 2015-05-13 VITALS — BP 128/68 | HR 64 | Temp 98.5°F | Resp 16 | Ht 65.0 in | Wt 172.0 lb

## 2015-05-13 DIAGNOSIS — A047 Enterocolitis due to Clostridium difficile: Secondary | ICD-10-CM | POA: Diagnosis not present

## 2015-05-13 DIAGNOSIS — A0472 Enterocolitis due to Clostridium difficile, not specified as recurrent: Secondary | ICD-10-CM

## 2015-05-13 DIAGNOSIS — I10 Essential (primary) hypertension: Secondary | ICD-10-CM

## 2015-05-13 MED ORDER — AMLODIPINE BESYLATE 5 MG PO TABS: 5.0000 mg | ORAL_TABLET | Freq: Every day | ORAL | Status: DC

## 2015-05-13 MED ORDER — HYDROCHLOROTHIAZIDE 25 MG PO TABS: 25.0000 mg | ORAL_TABLET | Freq: Every day | ORAL | Status: DC

## 2015-05-13 NOTE — Progress Notes (Signed)
   Subjective:    Patient ID: Toni [name], female    DOB: May 20, 1934, 81 y.o.   MRN: [id number]  HPI She is post hospitalization for C. difficile colitis.  She was originally admitted 4/14-4/22/16 with diverticulitis for which she was given dual course antibiotics.  She was readmitted 4/25-04/10/15 with C. difficile. Unfortunately 5 days after completing vancomycin the C. difficile recurred. She was readmitted 5/24-5/27/16. She was discharged to complete a 12 day course of vancomycin.  At this time stools are soft; she has no active GI symptoms.  She followed up with Dr. Justin Mend, nephrologist for the renal lesion found on CT. This was unchanged since 2008 necessitating no further monitor.  On 05/05/15 creatinine was 1.04, BUN 10, and GFR 50. She demonstrated a mild anemia but this was improving at the time of discharge. The last hemoglobin and hematocrit on record were 10.8 and 32.3 on 5/25. She had exhibited hypokalemia with potassium as low as 3.1 but this also was corrected.   Review of Systems Unexplained weight loss, abdominal pain, significant dyspepsia, dysphagia, melena, rectal bleeding, or persistently small caliber stools are denied.    Objective:   Physical Exam  General appearance is one of good health and nourishment w/o distress.  Nose: Marked erythema of the nasal mucosa and septa  Eyes: No conjunctival inflammation or scleral icterus is present.  Oral exam: Dental hygiene is good; lips and gums are healthy appearing.There is no oropharyngeal erythema or exudate noted.   Heart:  Normal rate and regular rhythm. S1 and S2 normal without gallop, murmur, click, rub or other extra sounds     Lungs:Chest clear to auscultation; no wheezes, rhonchi,rales ,or rubs present.No increased work of breathing.   Abdomen: bowel sounds normal, soft and non-tender without masses, organomegaly or hernias noted.  No guarding or rebound .   Musculoskeletal: Able to lie flat and sit up  without help. Negative straight leg raising bilaterally. Gait normal  Skin:Warm & dry.  Intact without suspicious lesions or rashes ; no jaundice . Slight tenting  Lymphatic: No lymphadenopathy is noted about the head, neck, axilla             Assessment & Plan:  #1 C. difficile colitis, clinically dramatically improved. Stools are almost back to normal  #2 hypertension, well controlled. Amlodipine will be renewed

## 2015-05-13 NOTE — Patient Instructions (Addendum)
Plain Mucinex (NOT D) for thick secretions ;force NON dairy fluids .   Nasal cleansing in the shower as discussed with lather of mild shampoo.After 10 seconds wash off lather while  exhaling through nostrils. Make sure that all residual soap is removed to prevent irritation.  Flonase OR Nasacort AQ 1 spray in each nostril twice a day as needed. Use the "crossover" technique into opposite nostril spraying toward opposite ear @ 45 degree angle, not straight up into nostril.  Plain Allegra (NOT D )  160 daily , Loratidine 10 mg , OR Zyrtec 10 mg @ bedtime  as needed for itchy eyes & sneezing.  Please take a probiotic , Florastor OR Align, every day if the bowels are loose. This will replace the normal bacteria which  are necessary for formation of normal stool and processing of food.  Minimal Blood Pressure Goal= AVERAGE < 140/90;  Ideal is an AVERAGE < 135/85. This AVERAGE should be calculated from @ least 5-7 BP readings taken @ different times of day on different days of week. You should not respond to isolated BP readings , but rather the AVERAGE for that week .Please bring your  blood pressure cuff to office visits to verify that it is reliable.It  can also be checked against the blood pressure device at the pharmacy.

## 2015-05-13 NOTE — Progress Notes (Signed)
Pre visit review using our clinic review tool, if applicable. No additional management support is needed unless otherwise documented below in the visit note. 

## 2015-05-13 NOTE — Telephone Encounter (Signed)
Please advise, medication was discontinued at last hospital discharge

## 2015-05-13 NOTE — Telephone Encounter (Signed)
Blood pressure was excellent today; this was to have been held at discharge from hospital. If you've not been taking it and I do not want to renew it at this time but simply monitor your blood pressure.

## 2015-05-13 NOTE — Telephone Encounter (Signed)
Patient is requesting refill on HCTZ to be sent to [facility] on [address].

## 2015-05-13 NOTE — Telephone Encounter (Signed)
Pt states she has continued to take medication, including today. PCP advised and verbally authorized refill. Pt advised of same

## 2015-05-17 ENCOUNTER — Telehealth: Payer: Self-pay | Admitting: Internal Medicine

## 2015-05-17 NOTE — Telephone Encounter (Signed)
Pt called in and wanted to know why amLODipine (NORVASC) 5 MG tablet [[id number]] was called in?    She said " I DO NOT want to take this if i dont have too"    She is requesting a call back from the nurse

## 2015-05-17 NOTE — Telephone Encounter (Signed)
Patient was not aware that this is same med as "norvasc"--i advised that if she does not want to take it, to make sure she monitors her blood pressure while not on medication in case blood pressure rises---patient stated she would continue taking medication

## 2015-06-14 ENCOUNTER — Encounter: Payer: Self-pay | Admitting: Internal Medicine

## 2015-06-14 ENCOUNTER — Ambulatory Visit (INDEPENDENT_AMBULATORY_CARE_PROVIDER_SITE_OTHER): Payer: Medicare Other | Admitting: Internal Medicine

## 2015-06-14 VITALS — BP 122/66 | HR 73 | Temp 97.6°F | Wt 172.1 lb

## 2015-06-14 DIAGNOSIS — R195 Other fecal abnormalities: Secondary | ICD-10-CM | POA: Diagnosis not present

## 2015-06-14 DIAGNOSIS — I639 Cerebral infarction, unspecified: Secondary | ICD-10-CM

## 2015-06-14 MED ORDER — HYOSCYAMINE SULFATE 0.125 MG SL SUBL: 0.1250 mg | SUBLINGUAL_TABLET | Freq: Four times a day (QID) | SUBLINGUAL | Status: DC | PRN

## 2015-06-14 NOTE — Progress Notes (Signed)
   Subjective:    Patient ID: Toni [name], female    DOB: 02/24/1934, 81 y.o.   MRN: [id number]  HPI She was treated for C. difficile which occurred after she had received antibiotics for diverticulitis. Her stools now are markedly variable. She can have  3-4 loose stools per day and 1-2 normal stools a day. She described marked "gurgling" in her stomach and projectile stools intermittently. The stools have been described as green intermittently over the last 2-3 days.  She is somewhat vague as to whether she's taking a probiotic. She takes Imodium for frankly watery stool. She denies any associated abdominal pain.  She's lost 12-15 pounds since January. She has had no weight loss the last month.  Her last TSH was 1.01 in March of this year.  Review of Systems Unexplained weight loss, abdominal pain, significant dyspepsia, dysphagia, melena, rectal bleeding, or persistently small caliber stools are denied. Dysuria, pyuria, hematuria, frequency, or polyuria are denied. Nocturia X 1 nightly.    Objective:   Physical Exam  Pertinent or positive findings include: She has some tinkling bowel sounds; clinically there is no sign of ileus as her abdomen is soft and nontender.  General appearance :adequately nourished; in no distress.  Eyes: No conjunctival inflammation or scleral icterus is present.  Oral exam:  Lips and gums are healthy appearing.There is no oropharyngeal erythema or exudate noted. Dental hygiene is good.  Heart:  Normal rate and regular rhythm. S1 and S2 normal without gallop, murmur, click, rub or other extra sounds    Lungs:Chest clear to auscultation; no wheezes, rhonchi,rales ,or rubs present.No increased work of breathing.   Vascular : all pulses equal ; no bruits present.  Skin:Warm & dry.  Intact without suspicious lesions or rashes ; no tenting or jaundice   Lymphatic: No lymphadenopathy is noted about the head, neck, axilla   Neuro: Strength, tone   normal.         #1  loose-watery stool in the context of recent C. difficile colitis.. She may have a component of IBS  Plan: Trial of an anti-spasmodic every 6-8 hours as needed

## 2015-06-14 NOTE — Patient Instructions (Signed)
Avoid  milk , dairy or grease if bowels are not formed. Continue Florastor, Pro Biotic , daily if stools are loose. Immodium AD for frankly watery stool. Report increasing pain, fever or rectal bleeding

## 2015-06-14 NOTE — Progress Notes (Signed)
Pre visit review using our clinic review tool, if applicable. No additional management support is needed unless otherwise documented below in the visit note. 

## 2015-06-15 ENCOUNTER — Other Ambulatory Visit: Payer: Self-pay | Admitting: Internal Medicine

## 2015-06-22 ENCOUNTER — Other Ambulatory Visit: Payer: Self-pay

## 2015-06-22 DIAGNOSIS — R413 Other amnesia: Secondary | ICD-10-CM

## 2015-06-22 MED ORDER — DONEPEZIL HCL 5 MG PO TABS: 5.0000 mg | ORAL_TABLET | Freq: Every day | ORAL | Status: DC

## 2015-07-11 ENCOUNTER — Other Ambulatory Visit: Payer: Self-pay | Admitting: Internal Medicine

## 2015-07-16 ENCOUNTER — Other Ambulatory Visit: Payer: Self-pay | Admitting: Internal Medicine

## 2015-07-25 ENCOUNTER — Other Ambulatory Visit: Payer: Self-pay | Admitting: Internal Medicine

## 2015-08-31 ENCOUNTER — Other Ambulatory Visit: Payer: Self-pay | Admitting: Internal Medicine

## 2015-08-31 NOTE — Telephone Encounter (Signed)
Ativan rx faxed to pharm 

## 2015-09-21 ENCOUNTER — Ambulatory Visit (INDEPENDENT_AMBULATORY_CARE_PROVIDER_SITE_OTHER): Payer: Medicare Other | Admitting: Neurology

## 2015-09-21 ENCOUNTER — Encounter: Payer: Self-pay | Admitting: Neurology

## 2015-09-21 VITALS — BP 120/72 | HR 71 | Resp 16 | Wt 177.0 lb

## 2015-09-21 DIAGNOSIS — Z8673 Personal history of transient ischemic attack (TIA), and cerebral infarction without residual deficits: Secondary | ICD-10-CM | POA: Diagnosis not present

## 2015-09-21 DIAGNOSIS — F32A Depression, unspecified: Secondary | ICD-10-CM | POA: Insufficient documentation

## 2015-09-21 DIAGNOSIS — F329 Major depressive disorder, single episode, unspecified: Secondary | ICD-10-CM | POA: Diagnosis not present

## 2015-09-21 DIAGNOSIS — I10 Essential (primary) hypertension: Secondary | ICD-10-CM

## 2015-09-21 DIAGNOSIS — G3184 Mild cognitive impairment, so stated: Secondary | ICD-10-CM | POA: Diagnosis not present

## 2015-09-21 DIAGNOSIS — E785 Hyperlipidemia, unspecified: Secondary | ICD-10-CM

## 2015-09-21 DIAGNOSIS — I639 Cerebral infarction, unspecified: Secondary | ICD-10-CM

## 2015-09-21 NOTE — Patient Instructions (Addendum)
1. Continue all your medications 2. Discuss the option of increasing Zoloft with Dr. Linna Darner 3. Continue open discussion with family 4. Control of BP, cholesterol, Physical exercise, brain stimulation exercises important for brain health 5. Follow-up in 6 months

## 2015-09-21 NOTE — Progress Notes (Signed)
NEUROLOGY FOLLOW UP OFFICE NOTE  BURNETT LIEBER [id number]  HISTORY OF PRESENT ILLNESS: I had the pleasure of seeing Toni [name] in follow-up in the neurology clinic on 09/21/2015.  The patient was last seen 6 months ago for worsening memory. MOCA score at that time 18/30, indicating mild cognitive impairment. She is again accompanied by her husband, son, and daughter who help supplement the history today.  Records and images were personally reviewed where available.  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 She feels that her memory is better, she is remembering things more, and if she repeats them often enough, it helps her remember. She is tolerating Aricept without side effects, and started taking a supplement called Brain Health 1 tab BID around 2 months ago. Her husband became tearful when asked about her memory, stating "I can't tell if it's aging or atrophy," but she does not react well sometimes to him, they are bickering and disagreeing more. She becomes upset when he tries to assist her, thinking he is being bossy or treating her like a child or a nutcase. She states she has always been irritable. She misplaces things frequently. Her son calls her and notices she repeats the same thing within a few minutes, and feels this is occurring more frequent than last time. Her daughter reports more difficulties with the computer. She denies any headaches, dizziness, diplopia, focal numbness/tingling/weakness. She had a prolonged hospital course for diverticulitis and still feels some abdominal discomfort.   HPI: This is a pleasant 81 yo ambidextrous left-hand dominant woman with a history of hypertension, hyperlipidemia, presenting  for evaluation of memory loss with abnormal head CT. When asked about her memory, Toni [name] reports her short-term memory is "not good at all." Long-term memory is good, she can remember details about her childhood well. She started noticing changes in the past year, but worse in the past 4-5 months. She would forget conversations from 10 minutes ago. She forgot to pay bills in the past 2-3 months. She left the stove on twice around 3-4 months ago. She denies getting lost driving. She denies any problems multitasking, very seldom word-finding difficulties. Her son has noticed that when he calls her, she would repeat the same story at the end of the call, or have the same conversation 10 minutes later. He also noticed changes in the past 1-2 years, worse in the past 3-4 months. Her daughter feels symptoms started a little longer than 2 years ago, she forgot a conversation that someone was in jail in December 2015. She was staying at her daughter's house and forgot what they had talked about on their to-do list. They also have noticed she is very anxious. Her husband reports she had taken something out of the freezer one time, he came out the next day to see some things were not put back inside. He has noticed that she is more argumentative.   She reports 4 episodes where a shade would come up her right eye. This would last for a few minutes, last episode was in the fall of last year. No associated headache or focal numbness/tingling/weakness. She states she "always has headaches," indicating these are sinus headaches. She had seen Dr. Jacelyn Grip in 2013 and was diagnosed with cervicogenic headaches. She has headaches around  three times a week, with right frontal throbbing pain, relieved with over the counter pain medication. There is no associated nausea/vomiting/photo/phonophobia. She has been diagnosed with migraines where she has a round circle in her right eye occurring around 3-4 times a month. She denies any  dizziness, diplopia, blurred vision, neck/back pain, focal numbness/tingling/weakness. She has chronic constipations. She has occasional hand tremors. No anosmia. She denies any family history of memory problems. No history of head injuries. She drinks 1 to 1-1/2 glasses of wine at night.   I personally reviewed head CT without contrast done 03/03/15. There were several hypodensities in the bilateral hemispheres, right greater than left This was read as multifocal posterior circulation infarction with the largest area in the right occiptial pole and smaller area in th e left occipital cortex. Two small areas of cortical and subcortical infarct in the high right parietal lobe, small vessel infarct in the upper right cerebellum. These could be subacute or chronic. Brain atrophy with mild frontal predominance seen.  PAST MEDICAL HISTORY: Past Medical History  Diagnosis Date  . PUD (peptic ulcer disease) 1987    with h pylori  . Hepatic cyst   . Migraines   . Hypertension   . Hyperlipemia   . Colonic polyp   . Diverticulosis   . Helicobacter pylori gastritis 1987  . C. difficile colitis   . Acute kidney injury     MEDICATIONS: Current Outpatient Prescriptions on File Prior to Visit  Medication Sig Dispense Refill  . Coenzyme Q10 300 MG CAPS Take 1 capsule by mouth daily.     . cyanocobalamin 500 MCG tablet Take 500 mcg by mouth daily.    . cycloSPORINE (RESTASIS) 0.05 % ophthalmic emulsion Place 1 drop into both eyes 2 (two) times daily.     Marland Kitchen donepezil (ARICEPT) 5 MG tablet Take 1 tablet (5 mg total) by mouth at bedtime. 90 tablet 1  . omeprazole (PRILOSEC) 20 MG capsule Take 1 capsule by mouth daily as needed (heartburn).     . saccharomyces boulardii (FLORASTOR) 250 MG capsule Take 1 capsule (250 mg total) by mouth 2 (two) times daily. 60 capsule 1  . sertraline (ZOLOFT) 50 MG tablet TAKE 1 TABLET (50 MG TOTAL) BY MOUTH DAILY. 30 tablet 3  . amLODipine (NORVASC) 5 MG tablet Take 1 tablet  (5 mg total) by mouth daily. 90 tablet 3  . aspirin 81 MG tablet Take 81 mg by mouth daily.    . hydrALAZINE (APRESOLINE) 25 MG tablet TAKE 1 TABLET (25 MG TOTAL) BY MOUTH 3 (THREE) TIMES DAILY. (Patient taking differently: 25 mg by mouth 3 times daily.) 270 tablet 0  . hydrochlorothiazide (HYDRODIURIL) 25 MG tablet Take 1 tablet (25 mg total) by mouth daily. 90 tablet 3  . hyoscyamine (LEVSIN/SL) 0.125 MG SL tablet Place 1 tablet (0.125 mg total) under the tongue every 6 (six) hours as needed. 30 tablet 0  . LORazepam (ATIVAN) 0.5 MG tablet TAKE 1 TABLET AT BEDTIME AS NEEDED 30 tablet 0  . losartan (COZAAR) 100 MG tablet Take 100 mg by mouth daily.  3  . metoprolol tartrate (LOPRESSOR) 25 MG tablet TAKE 1 TABLET (25 MG TOTAL) BY MOUTH 2 (TWO) TIMES DAILY. 180 tablet 3   No current facility-administered medications on file prior to visit.    ALLERGIES: Allergies  Allergen Reactions  . Mavik [Trandolapril]     02/22/15 cough    FAMILY HISTORY: Family History  Problem Relation Age of Onset  . Asthma  Brother   . Hypertension Father   . Heart attack Father 70  . Leukemia Mother   . Stomach cancer Maternal Aunt   . Breast cancer Sister   . Thyroid disease Sister   . Stroke Neg Hx   . Diabetes Neg Hx     SOCIAL HISTORY: Social History   Social History  . Marital Status: Married    Spouse Name: N/A  . Number of Children: 2  . Years of Education: N/A   Occupational History  . Not on file.   Social History Main Topics  . Smoking status: Former Smoker    Quit date: 12/10/1973  . Smokeless tobacco: Never Used  . Alcohol Use: 0.0 oz/week    0 Standard drinks or equivalent per week     Comment:  socially  . Drug Use: No  . Sexual Activity: Not on file   Other Topics Concern  . Not on file   Social History Narrative    REVIEW OF SYSTEMS: Constitutional: No fevers, chills, or sweats, no generalized fatigue, change in appetite Eyes: No visual changes, double vision, eye  pain Ear, nose and throat: No hearing loss, ear pain, nasal congestion, sore throat Cardiovascular: No chest pain, palpitations Respiratory:  No shortness of breath at rest or with exertion, wheezes GastrointestinaI: No nausea, vomiting, diarrhea, abdominal pain, fecal incontinence Genitourinary:  No dysuria, urinary retention or frequency Musculoskeletal:  No neck pain, back pain Integumentary: No rash, pruritus, skin lesions Neurological: as above Psychiatric: No depression, insomnia, anxiety Endocrine: No palpitations, fatigue, diaphoresis, mood swings, change in appetite, change in weight, increased thirst Hematologic/Lymphatic:  No anemia, purpura, petechiae. Allergic/Immunologic: no itchy/runny eyes, nasal congestion, recent allergic reactions, rashes  PHYSICAL EXAM: Filed Vitals:   09/21/15 1057  BP: 120/72  Pulse: 71  Resp: 16   General: No acute distress Head:  Normocephalic/atraumatic Neck: supple, no paraspinal tenderness, full range of motion Heart:  Regular rate and rhythm Lungs:  Clear to auscultation bilaterally Back: No paraspinal tenderness Skin/Extremities: No rash, no edema Neurological Exam: alert and oriented to person, place, and month/season. She said it is Thursday 2017 (it is Wed 2016). No aphasia or dysarthria. Fund of knowledge is appropriate.  Recent and remote memory are intact.  Attention and concentration are normal.    Able to name objects and repeat phrases. Clock drawing test 4/5 (better than previous) MMSE - Mini Mental State Exam 09/21/2015  Orientation to time 2  Orientation to Place 5  Registration 3  Attention/ Calculation 5  Recall 1  Language- name 2 objects 2  Language- repeat 1  Language- follow 3 step command 3  Language- read & follow direction 1  Write a sentence 1  Copy design 1  Total score 25   Cranial nerves: Pupils equal, round, reactive to light.  Fundoscopic exam unremarkable, no papilledema. Extraocular movements intact  with no nystagmus. Visual fields full. Facial sensation intact. No facial asymmetry. Tongue, uvula, palate midline.  Motor: Bulk and tone normal, muscle strength 5/5 throughout with no pronator drift.  Sensation to light touch intact.  No extinction to double simultaneous stimulation.  Deep tendon reflexes +1 throughout, toes downgoing.  Finger to nose testing intact.  Gait narrow-based and steady, mild difficulty with tandem walk but able.  Romberg negative.  IMPRESSION: This is a pleasant 81 yo LH woman with vascular risk factors including hypertension, hyperlipidemia, with mild cognitive impairment. MMSE today 25/30, again indicating mild cognitive impairment. Continue daily low dose Aricept 5mg . We  discussed increased irritability with her husband and family, she may benefit from increasing dose of Zoloft and will discuss this with her PCP. We discussed her MRI brain showing old bilateral infarcts, continue daily aspirin and control of vascular risk factors. We also again discussed the importance of physical exercise and brain stimulation exercises for brain health. She will follow-up in 6 months or earlier if needed.  Thank you for allowing me to participate in her care.  Please do not hesitate to call for any questions or concerns.  The duration of this appointment visit was 24 minutes of face-to-face time with the patient.  Greater than 50% of this time was spent in counseling, explanation of diagnosis, planning of further management, and coordination of care.   Ellouise Newer, M.D.   CC: Dr. Linna Darner

## 2015-09-26 ENCOUNTER — Ambulatory Visit (INDEPENDENT_AMBULATORY_CARE_PROVIDER_SITE_OTHER): Payer: Medicare Other | Admitting: Internal Medicine

## 2015-09-26 ENCOUNTER — Other Ambulatory Visit (INDEPENDENT_AMBULATORY_CARE_PROVIDER_SITE_OTHER): Payer: Medicare Other

## 2015-09-26 ENCOUNTER — Encounter: Payer: Self-pay | Admitting: Internal Medicine

## 2015-09-26 VITALS — BP 130/70 | HR 78 | Temp 98.1°F | Resp 17 | Ht 65.0 in | Wt 176.0 lb

## 2015-09-26 DIAGNOSIS — R197 Diarrhea, unspecified: Secondary | ICD-10-CM

## 2015-09-26 DIAGNOSIS — R5382 Chronic fatigue, unspecified: Secondary | ICD-10-CM

## 2015-09-26 DIAGNOSIS — R10814 Left lower quadrant abdominal tenderness: Secondary | ICD-10-CM

## 2015-09-26 DIAGNOSIS — R413 Other amnesia: Secondary | ICD-10-CM

## 2015-09-26 DIAGNOSIS — F329 Major depressive disorder, single episode, unspecified: Secondary | ICD-10-CM

## 2015-09-26 DIAGNOSIS — F32A Depression, unspecified: Secondary | ICD-10-CM

## 2015-09-26 DIAGNOSIS — Z23 Encounter for immunization: Secondary | ICD-10-CM | POA: Diagnosis not present

## 2015-09-26 DIAGNOSIS — I639 Cerebral infarction, unspecified: Secondary | ICD-10-CM

## 2015-09-26 LAB — CBC WITH DIFFERENTIAL/PLATELET
Basophils Absolute: 0 10*3/uL (ref 0.0–0.1)
Basophils Relative: 0.4 % (ref 0.0–3.0)
Eosinophils Absolute: 0.1 10*3/uL (ref 0.0–0.7)
Eosinophils Relative: 1.2 % (ref 0.0–5.0)
HCT: 37.3 % (ref 36.0–46.0)
Hemoglobin: 12.8 g/dL (ref 12.0–15.0)
LYMPHS ABS: 1.8 10*3/uL (ref 0.7–4.0)
Lymphocytes Relative: 18.6 % (ref 12.0–46.0)
MCHC: 34.3 g/dL (ref 30.0–36.0)
MCV: 88.8 fl (ref 78.0–100.0)
Monocytes Absolute: 0.6 10*3/uL (ref 0.1–1.0)
Monocytes Relative: 6.4 % (ref 3.0–12.0)
NEUTROS ABS: 7 10*3/uL (ref 1.4–7.7)
NEUTROS PCT: 73.4 % (ref 43.0–77.0)
PLATELETS: 378 10*3/uL (ref 150.0–400.0)
RBC: 4.2 Mil/uL (ref 3.87–5.11)
RDW: 14.5 % (ref 11.5–15.5)
WBC: 9.5 10*3/uL (ref 4.0–10.5)

## 2015-09-26 LAB — HEPATIC FUNCTION PANEL
ALBUMIN: 4 g/dL (ref 3.5–5.2)
ALT: 13 U/L (ref 0–35)
AST: 15 U/L (ref 0–37)
Alkaline Phosphatase: 96 U/L (ref 39–117)
Bilirubin, Direct: 0.1 mg/dL (ref 0.0–0.3)
Total Bilirubin: 0.7 mg/dL (ref 0.2–1.2)
Total Protein: 7.4 g/dL (ref 6.0–8.3)

## 2015-09-26 LAB — BASIC METABOLIC PANEL WITH GFR
BUN: 19 mg/dL (ref 6–23)
CO2: 28 meq/L (ref 19–32)
Calcium: 9.7 mg/dL (ref 8.4–10.5)
Chloride: 100 meq/L (ref 96–112)
Creatinine, Ser: 1.08 mg/dL (ref 0.40–1.20)
GFR: 51.93 mL/min — ABNORMAL LOW
Glucose, Bld: 113 mg/dL — ABNORMAL HIGH (ref 70–99)
Potassium: 4.4 meq/L (ref 3.5–5.1)
Sodium: 138 meq/L (ref 135–145)

## 2015-09-26 LAB — VITAMIN B12: VITAMIN B 12: 924 pg/mL — AB (ref 211–911)

## 2015-09-26 LAB — TSH: TSH: 1.22 u[IU]/mL (ref 0.35–4.50)

## 2015-09-26 NOTE — Progress Notes (Signed)
Pre visit review using our clinic review tool, if applicable. No additional management support is needed unless otherwise documented below in the visit note. 

## 2015-09-26 NOTE — Progress Notes (Signed)
   Subjective:    Patient ID: Toni [name], female    DOB: 1934-05-12, 81 y.o.   MRN: [id number]  HPI She states that she's had diarrhea for months. What she describes are watery bowel movements at least 3 every other day. She'll use Imodium as well as generic Levsin SL. She's had intermittent left lower quadrant abdominal discomfort. Triggers for the "diarrhea" & the abdominal pain include tomatoes, seeds, spicy foods, and onions.  She denies nausea and vomiting but her husband states she has exhibited such.  She also describes fatigue and depression; she had to read these complaints from a list of complaints. She is on sertraline at this time.  PMH of C difficle diarrhea.Colonoscopy 2003 and 2012.Polypectomy by Dr Collene Mares, GI.  Review of Systems  Unexplained weight loss, significant dyspepsia, dysphagia, melena, rectal bleeding, or persistently small caliber stools are denied. Cola colored urine or clay colored stools denied.  She denies heat or cold intolerance. Her husband states that "she is always hot".  Frontal headache, facial pain , nasal purulence, dental pain, sore throat , otic pain or otic discharge denied.  Cough, sputum production, hemoptysis, or pleuritic pain denied.  Dysuria, pyuria, or hematuria not present.  No vaginal discharge or bleeding noted.  No new rashes, pustules, vesicles.  No redness or swelling of joints.      Objective:   Physical Exam Pertinent or positive findings include: Knee reflexes are 0+. She is very Building control surveyor. She's intermittently laughing. She was unable to give me the date of the month or the day of the week. She exhibits tenderness to palpation in the left lower quadrant.  General appearance :adequately nourished; in no distress.  Eyes: No conjunctival inflammation or scleral icterus is present.  Oral exam:  Lips and gums are healthy appearing.There is no oropharyngeal erythema or exudate noted. Dental hygiene is  good.  Heart:  Normal rate and regular rhythm. S1 and S2 normal without gallop, murmur, click, rub or other extra sounds    Lungs:Chest clear to auscultation; no wheezes, rhonchi,rales ,or rubs present.No increased work of breathing.   Abdomen: bowel sounds normal, soft without masses, organomegaly or hernias noted.  No guarding or rebound. No flank tenderness to percussion.  Vascular : all pulses equal ; no bruits present.  Skin:Warm & dry.  Intact without suspicious lesions or rashes ; no tenting or jaundice   Lymphatic: No lymphadenopathy is noted about the head, neck, axilla.   Neuro: Strength, tone  normal.     Assessment & Plan:  #1 diarrhea  #2 left lower quadrant tenderness to palpation  #3 fatigue   #4 subjective complaint of depression yet clinically this is not present  #4 memory deficit  See orders and recommendations

## 2015-09-26 NOTE — Patient Instructions (Signed)
  Your next office appointment will be determined based upon review of your pending labs. Those written interpretation of the lab results and instructions will be transmitted to you by My Chart  Critical results will be called.   Followup as needed for any active or acute issue. Please report any significant change in your symptoms. 

## 2015-09-28 ENCOUNTER — Other Ambulatory Visit (INDEPENDENT_AMBULATORY_CARE_PROVIDER_SITE_OTHER): Payer: Medicare Other

## 2015-09-28 DIAGNOSIS — R739 Hyperglycemia, unspecified: Secondary | ICD-10-CM | POA: Diagnosis not present

## 2015-09-28 LAB — HEMOGLOBIN A1C: Hgb A1c MFr Bld: 5.2 % (ref 4.6–6.5)

## 2015-10-05 ENCOUNTER — Other Ambulatory Visit: Payer: Self-pay

## 2015-10-05 DIAGNOSIS — R195 Other fecal abnormalities: Secondary | ICD-10-CM

## 2015-10-05 MED ORDER — HYOSCYAMINE SULFATE 0.125 MG SL SUBL: 0.1250 mg | SUBLINGUAL_TABLET | Freq: Four times a day (QID) | SUBLINGUAL | Status: DC | PRN

## 2015-10-05 NOTE — Telephone Encounter (Signed)
Refill request for Hyoscyamine to [facility] ([phone]).

## 2015-10-05 NOTE — Telephone Encounter (Signed)
Refill sent to [facility]lmb 

## 2015-10-05 NOTE — Telephone Encounter (Signed)
OK X 1 She needs to F/U with her GI, Dr Collene Mares if symptoms persist Thanks, [employer]

## 2015-10-10 ENCOUNTER — Other Ambulatory Visit: Payer: Self-pay | Admitting: Internal Medicine

## 2015-10-11 ENCOUNTER — Other Ambulatory Visit: Payer: Self-pay

## 2015-10-11 DIAGNOSIS — R195 Other fecal abnormalities: Secondary | ICD-10-CM

## 2015-10-11 MED ORDER — HYOSCYAMINE SULFATE 0.125 MG SL SUBL: 0.1250 mg | SUBLINGUAL_TABLET | Freq: Four times a day (QID) | SUBLINGUAL | Status: DC | PRN

## 2015-10-12 ENCOUNTER — Other Ambulatory Visit: Payer: Self-pay | Admitting: Emergency Medicine

## 2015-10-12 ENCOUNTER — Other Ambulatory Visit: Payer: Self-pay | Admitting: Internal Medicine

## 2015-10-12 MED ORDER — LORAZEPAM 0.5 MG PO TABS: 0.5000 mg | ORAL_TABLET | Freq: Every evening | ORAL | Status: DC | PRN

## 2015-10-25 DIAGNOSIS — R634 Abnormal weight loss: Secondary | ICD-10-CM | POA: Diagnosis not present

## 2015-10-25 DIAGNOSIS — K5730 Diverticulosis of large intestine without perforation or abscess without bleeding: Secondary | ICD-10-CM | POA: Diagnosis not present

## 2015-10-25 DIAGNOSIS — K580 Irritable bowel syndrome with diarrhea: Secondary | ICD-10-CM | POA: Diagnosis not present

## 2015-10-25 DIAGNOSIS — R1032 Left lower quadrant pain: Secondary | ICD-10-CM | POA: Diagnosis not present

## 2015-11-05 ENCOUNTER — Other Ambulatory Visit: Payer: Self-pay | Admitting: Internal Medicine

## 2015-11-18 ENCOUNTER — Telehealth: Payer: Self-pay | Admitting: Internal Medicine

## 2015-11-18 NOTE — Telephone Encounter (Signed)
Patient states she spoke to you about removing a no show fee, because she was in the hospital.  She has received another bill.  Can you please look into this.

## 2015-12-15 ENCOUNTER — Other Ambulatory Visit: Payer: Self-pay | Admitting: Internal Medicine

## 2015-12-15 NOTE — Telephone Encounter (Signed)
Pt has made appt to see Dr. Quay Burow will send enough until she estab w/Dr. Quay Burow on 1/17...Toni [name]

## 2015-12-26 ENCOUNTER — Ambulatory Visit (INDEPENDENT_AMBULATORY_CARE_PROVIDER_SITE_OTHER): Payer: Medicare Other | Admitting: Internal Medicine

## 2015-12-26 ENCOUNTER — Encounter: Payer: Self-pay | Admitting: Internal Medicine

## 2015-12-26 VITALS — BP 162/90 | HR 71 | Temp 97.7°F | Resp 16 | Wt 181.0 lb

## 2015-12-26 DIAGNOSIS — F329 Major depressive disorder, single episode, unspecified: Secondary | ICD-10-CM | POA: Diagnosis not present

## 2015-12-26 DIAGNOSIS — Z23 Encounter for immunization: Secondary | ICD-10-CM | POA: Diagnosis not present

## 2015-12-26 DIAGNOSIS — G3184 Mild cognitive impairment, so stated: Secondary | ICD-10-CM

## 2015-12-26 DIAGNOSIS — I10 Essential (primary) hypertension: Secondary | ICD-10-CM | POA: Diagnosis not present

## 2015-12-26 DIAGNOSIS — K219 Gastro-esophageal reflux disease without esophagitis: Secondary | ICD-10-CM

## 2015-12-26 DIAGNOSIS — F32A Depression, unspecified: Secondary | ICD-10-CM

## 2015-12-26 MED ORDER — SERTRALINE HCL 100 MG PO TABS: 100.0000 mg | ORAL_TABLET | Freq: Every day | ORAL | Status: DC

## 2015-12-26 MED ORDER — HYDRALAZINE HCL 25 MG PO TABS: 25.0000 mg | ORAL_TABLET | Freq: Two times a day (BID) | ORAL | Status: DC

## 2015-12-26 NOTE — Assessment & Plan Note (Signed)
Increased depression and irritability Will increase sertraline to 100 mg daily and see her she does with that Follow-up in 6 months, sooner if needed

## 2015-12-26 NOTE — Assessment & Plan Note (Signed)
Taking omeprazole daily Controlled per patient Does take over-the-counter antacids as needed - ? Poor historian regarding how much and how often she takes these Continue omeprazole daily

## 2015-12-26 NOTE — Assessment & Plan Note (Addendum)
Following with neurology every 6 months Currently on Aricept Her husband sees this as a bigger problem than she does - she feels her memory is stable

## 2015-12-26 NOTE — Patient Instructions (Signed)
  We have reviewed your prior records including labs and tests today.  All other Health Maintenance issues reviewed.   All recommended immunizations and age-appropriate screenings are up-to-date.  prevnar pneumonia  administered today.   Medications reviewed and updated.  Changes include increasing the sertraline to 100 mg daily.  Your prescription(s) have been submitted to your pharmacy. Please take as directed and contact our office if you believe you are having problem(s) with the medication(s).  Please schedule followup in 6 months

## 2015-12-26 NOTE — Addendum Note (Signed)
Addended by: Terence Lux B on: 12/26/2015 03:00 PM   Modules accepted: Orders

## 2015-12-26 NOTE — Progress Notes (Signed)
Pre visit review using our clinic review tool, if applicable. No additional management support is needed unless otherwise documented below in the visit note. 

## 2015-12-26 NOTE — Progress Notes (Signed)
Subjective:    Patient ID: Toni [name], female    DOB: Jan 21, 1934, 82 y.o.   MRN: [id number]  HPI She is here to establish with a new pcp.  She is here for routine follow-up.  Depression: She has a history of depression. She does feel depressed at times and definitely feels more irritable. She follows with neurology and in her last visit neurology thought she would benefit from an increased dose in her sertraline. She agrees this may help. She does feel irritable toward her family members frequently. She states she feels depressed, but typically only in the morning. She tends to sleep a lot during the day.  IBS, frequent bowel movements: She has a history of diverticulosis, diverticulitis and C. Difficile diarrhea.She was hospitalized last year and since then has had frequent bowel movements.She states her bowel movements are normal and not excessive, but her husband feels they are excessive She denies blood in the stool. She does follow with GI for her IBS and states she will follow-up with them.She takes Imodium and antacids as needed.  GERD: She takes omeprazole daily. She may have heartburn if she eats certain foods, but she states it is only occasional.Her husband feels that she takes an aspirin. Frequently, but she denies this.  Hypertension: She is taking her medication daily. She is compliant with a low sodium diet.  She denies chest pain, palpitations, edema, shortness of breath and regular headaches. She is not exercising regularly.  She does monitor her blood pressure at home, controlled at home - typically average.    Mild cognitive dysfunction:  Stable short term memory issues.  Long term memory is good.  She is taking the Aricept daily as prescribed. She follows with neurology in the [place] twice a year. She feels her memory has been stable.  Medications and allergies reviewed with patient and updated if appropriate.  Patient Active Problem List   Diagnosis Date Noted    . Mild cognitive impairment 09/21/2015  . Depression 09/21/2015  . History of stroke 09/21/2015  . CKD (chronic kidney disease)   . Diarrhea 05/03/2015  . Hypokalemia 05/03/2015  . Lesion of right native kidney 04/13/2015  . Sepsis ([place]) 04/05/2015  . Diastolic CHF ([place]) [id number]  . History of Clostridium difficile infection 04/05/2015  . Blood poisoning ([place])   . Acute diastolic CHF (congestive heart failure) ([place]) 03/31/2015  . Acute renal failure ([place])   . Pulmonary edema   . AKI (acute kidney injury) ([place]) 03/24/2015  . Dehydration 03/24/2015  . Hyponatremia 03/24/2015  . Essential hypertension 03/23/2015  . Abnormal CT of brain 03/04/2015  . Headache 11/09/2013  . Irregular heartbeat 11/09/2013  . Cervicogenic headache 01/22/2012  . Diverticulosis of large intestine 10/28/2009  . DIVERTICULITIS, HX OF 10/28/2009  . FASTING HYPERGLYCEMIA 03/07/2009  . COLONIC POLYPS, HX OF 03/07/2009  . ANXIETY STATE, UNSPECIFIED 07/28/2008  . HOT FLASHES 07/28/2008  . DRY EYE SYNDROME 03/12/2008  . HYPERLIPIDEMIA 04/21/2007  . HEPATIC CYST 04/21/2007  . MIGRAINES, HX OF 04/21/2007    Current Outpatient Prescriptions on File Prior to Visit  Medication Sig Dispense Refill  . amLODipine (NORVASC) 5 MG tablet Take 1 tablet (5 mg total) by mouth daily. 90 tablet 3  . aspirin 81 MG tablet Take 81 mg by mouth daily.    . cholestyramine (QUESTRAN) 4 GM/DOSE powder TAKE 4 GRAMS BY MOUTH TWICE A DAY  5  . Coenzyme Q10 300 MG CAPS Take 1 capsule by mouth daily.     Marland Kitchen  cyanocobalamin 500 MCG tablet Take 500 mcg by mouth daily.    . cycloSPORINE (RESTASIS) 0.05 % ophthalmic emulsion Place 1 drop into both eyes 2 (two) times daily.     Marland Kitchen donepezil (ARICEPT) 5 MG tablet TAKE 1 TABLET (5 MG TOTAL) BY MOUTH AT BEDTIME. 90 tablet 1  . hydrochlorothiazide (HYDRODIURIL) 25 MG tablet Take 1 tablet (25 mg total) by mouth daily. 90 tablet 3  . LORazepam (ATIVAN) 0.5 MG tablet Take 1 tablet (0.5 mg total)  by mouth at bedtime as needed. ---Pt should establish with new PCP for further refills 30 tablet 0  . losartan (COZAAR) 100 MG tablet Take 100 mg by mouth daily.  3  . metoprolol tartrate (LOPRESSOR) 25 MG tablet TAKE 1 TABLET (25 MG TOTAL) BY MOUTH 2 (TWO) TIMES DAILY. 180 tablet 3  . omeprazole (PRILOSEC) 20 MG capsule Take 1 capsule by mouth daily as needed (heartburn).     . saccharomyces boulardii (FLORASTOR) 250 MG capsule Take 1 capsule (250 mg total) by mouth 2 (two) times daily. 60 capsule 1   No current facility-administered medications on file prior to visit.    Past Medical History  Diagnosis Date  . PUD (peptic ulcer disease) 1987    with h pylori  . Hepatic cyst   . Migraines   . Hypertension   . Hyperlipemia   . Colonic polyp   . Diverticulosis   . Helicobacter pylori gastritis 1987  . C. difficile colitis   . Acute kidney injury [facility])     Past Surgical History  Procedure Laterality Date  . Abdominal hysterectomy      BSO ; endometriosis; ? Appendectomy incidentally  . Cholecystectomy  2008  . Rotator cuff repair      right  . Colonoscopy  2003 & 2012    polyps; Dr Collene Mares  . Endoscopy gastritis  2008  . Tonsillectomy and adenoidectomy      Social History   Social History  . Marital Status: Married    Spouse Name: N/A  . Number of Children: 2  . Years of Education: N/A   Social History Main Topics  . Smoking status: Former Smoker    Quit date: 12/10/1973  . Smokeless tobacco: Never Used  . Alcohol Use: 0.0 oz/week    0 Standard drinks or equivalent per week     Comment:  socially  . Drug Use: No  . Sexual Activity: Not Asked   Other Topics Concern  . None   Social History Narrative    Family History  Problem Relation Age of Onset  . Asthma Brother   . Hypertension Father   . Heart attack Father 59  . Leukemia Mother   . Stomach cancer Maternal Aunt   . Breast cancer Sister   . Thyroid disease Sister   . Stroke Neg Hx   . Diabetes Neg  Hx     Review of Systems  Constitutional: Negative for fever, chills, appetite change, fatigue and unexpected weight change.  Respiratory: Negative for cough, shortness of breath and wheezing.   Cardiovascular: Positive for chest pain (intermittent in morning only, heaviness). Negative for palpitations and leg swelling.  Gastrointestinal: Negative for nausea, abdominal pain, diarrhea, constipation and blood in stool.       GERD - with certain foods, occasional  Genitourinary: Negative for dysuria and hematuria.  Neurological: Positive for dizziness (ocassional with sinus issues) and headaches (tension headaches). Negative for light-headedness.  Psychiatric/Behavioral: Positive for dysphoric mood. Negative for sleep  disturbance.       Irritable       Objective:   Filed Vitals:   12/26/15 1016  BP: 162/90  Pulse: 71  Temp: 97.7 F (36.5 C)  Resp: 16   Filed Weights   12/26/15 1016  Weight: 181 lb (82.101 kg)   Body mass index is 30.12 kg/(m^2).   Physical Exam Constitutional: Appears well-developed and well-nourished. No distress.  Neck: Neck supple. No tracheal deviation present. No thyromegaly present.  No carotid bruit. No cervical adenopathy.   Cardiovascular: Normal rate, regular rhythm and normal heart sounds.   No murmur heard.  No edema Pulmonary/Chest: Effort normal and breath sounds normal. No respiratory distress. No wheezes.  Abdomen: soft, nontender, nondistended       Assessment & Plan:   See Problem List for Assessment and Plan of chronic medical problems.  Follow-up in 6 months sooner if needed

## 2015-12-26 NOTE — Assessment & Plan Note (Signed)
BP Readings from Last 3 Encounters:  12/26/15 162/90  09/26/15 130/70  09/21/15 120/72   Blood pressure is typically well controlled at home and appears to have been better controlled previous to today She will continue to monitor at home Continue current medication There was a question regarding if she was taking the hydralazine and if so how often-advised her to check when she gets home with the AVS that we give her

## 2016-01-09 ENCOUNTER — Telehealth: Payer: Self-pay

## 2016-01-09 NOTE — Telephone Encounter (Signed)
Call to home and lvm for AWV; Patient has memory deficits; Family may need resources or respite;  Would like to assess for needs and completed AWV if call returned

## 2016-01-26 ENCOUNTER — Other Ambulatory Visit: Payer: Self-pay | Admitting: Internal Medicine

## 2016-01-30 ENCOUNTER — Other Ambulatory Visit: Payer: Self-pay | Admitting: Internal Medicine

## 2016-03-22 ENCOUNTER — Ambulatory Visit (INDEPENDENT_AMBULATORY_CARE_PROVIDER_SITE_OTHER): Payer: Medicare Other | Admitting: Neurology

## 2016-03-22 ENCOUNTER — Encounter: Payer: Self-pay | Admitting: Neurology

## 2016-03-22 VITALS — BP 120/72 | HR 67 | Resp 16 | Wt 177.0 lb

## 2016-03-22 DIAGNOSIS — F0391 Unspecified dementia with behavioral disturbance: Secondary | ICD-10-CM | POA: Insufficient documentation

## 2016-03-22 DIAGNOSIS — F03B18 Unspecified dementia, moderate, with other behavioral disturbance: Secondary | ICD-10-CM | POA: Insufficient documentation

## 2016-03-22 DIAGNOSIS — E785 Hyperlipidemia, unspecified: Secondary | ICD-10-CM

## 2016-03-22 DIAGNOSIS — F32A Depression, unspecified: Secondary | ICD-10-CM

## 2016-03-22 DIAGNOSIS — F0390 Unspecified dementia without behavioral disturbance: Secondary | ICD-10-CM

## 2016-03-22 DIAGNOSIS — I10 Essential (primary) hypertension: Secondary | ICD-10-CM | POA: Diagnosis not present

## 2016-03-22 DIAGNOSIS — F329 Major depressive disorder, single episode, unspecified: Secondary | ICD-10-CM | POA: Diagnosis not present

## 2016-03-22 DIAGNOSIS — Z8673 Personal history of transient ischemic attack (TIA), and cerebral infarction without residual deficits: Secondary | ICD-10-CM | POA: Diagnosis not present

## 2016-03-22 DIAGNOSIS — F03A0 Unspecified dementia, mild, without behavioral disturbance, psychotic disturbance, mood disturbance, and anxiety: Secondary | ICD-10-CM

## 2016-03-22 MED ORDER — DONEPEZIL HCL 10 MG PO TABS: ORAL_TABLET | ORAL | Status: DC

## 2016-03-22 NOTE — Patient Instructions (Signed)
1. Your new prescription for Aricept 10mg  daily will be in the pharmacy 2. Would have your husband check on your medications once a week to ensure you are taking everything correctly 3. Discuss mood with Dr. Quay Burow 4. Physical exercise and brain stimulation exercises are important for brain health 5. Follow-up in 6 months, call for any changes

## 2016-03-22 NOTE — Progress Notes (Signed)
NEUROLOGY FOLLOW UP OFFICE NOTE  Toni [name] [id number]  HISTORY OF PRESENT ILLNESS: I had the pleasure of seeing Toni [name] in follow-up in the neurology clinic on 03/22/2016. The patient was last seen 6 months ago for worsening memory. MMSE in October 2016 was 25/30, again indicating mild cognitive impairment (similar to [facility] in April 2016 18/30). She is again accompanied by her husband who helps supplement the history today. Since her last visit, she reports her memory is "fine, it's great!" Her husband shakes his head. She denies getting lost driving. She reports that she is doing well keeping up with her medications, and refuses to let her husband check on this, "she does not want me to touch it." When asked about the Aricept, she states "I think I am taking it." Sleep is good. Her husband remains concerned about mood issues, she reports it is not good with him, but that she was surprised when her son mentioned concern about irritability on their last visit. She has a cold today, but denies any significant headaches, dizziness, focal symptoms, no falls.   HPI: This is a pleasant 82 yo ambidextrous left-hand dominant woman with a history of hypertension, hyperlipidemia, presenting for evaluation of memory loss with abnormal head CT. When asked about her memory, Toni [name] reports her short-term memory is "not good at all." Long-term memory is good, she can remember details about her childhood well. She started noticing changes in the past year, but worse in the past 4-5 months. She would forget conversations from 10 minutes ago. She forgot to pay bills in the past 2-3 months. She left the stove on twice around 3-4 months ago. She denies getting lost driving. She denies any problems multitasking, very seldom word-finding difficulties. Her son has noticed that when he calls her, she would repeat the same story at the end of the call, or have the same conversation 10 minutes later. He  also noticed changes in the past 1-2 years, worse in the past 3-4 months. Her daughter feels symptoms started a little longer than 2 years ago, she forgot a conversation that someone was in jail in December 2015. She was staying at her daughter's house and forgot what they had talked about on their to-do list. They also have noticed she is very anxious. Her husband reports she had taken something out of the freezer one time, he came out the next day to see some things were not put back inside. He has noticed that she is more argumentative.   She reports 4 episodes where a shade would come up her right eye. This would last for a few minutes, last episode was in the fall of last year. No associated headache or focal numbness/tingling/weakness. She states she "always has headaches," indicating these are sinus headaches. She had seen Dr. Jacelyn Grip in 2013 and was diagnosed with cervicogenic headaches. She has headaches around three times a week, with right frontal throbbing pain, relieved with over the counter pain medication. There is no associated nausea/vomiting/photo/phonophobia. She has been diagnosed with migraines where she has a round circle in her right eye occurring around 3-4 times a month. She denies any dizziness, diplopia, blurred vision, neck/back pain, focal numbness/tingling/weakness. She has chronic constipations. She has occasional hand tremors. No anosmia. She denies any family history of memory problems. No history of head injuries. She drinks 1 to 1-1/2 glasses of wine at night.   I personally reviewed head CT without contrast done 03/03/15. There were several hypodensities  in the bilateral hemispheres, right greater than left This was read as multifocal posterior circulation infarction with the largest area in the right occiptial pole and smaller area in th e left occipital cortex. Two small areas of cortical and subcortical infarct in the high right parietal lobe, small vessel infarct in the upper  right cerebellum. These could be subacute or chronic. Brain atrophy with mild frontal predominance seen.  MRI brain without contrast which did not show any acute changes, there was generalized atrophy with ventricular enlargement, chronic infarcts in the bilateral occipital lobes, right greater than left, small chronic infarct in the right parietal cortex, chronic microvascular disease.   PAST MEDICAL HISTORY: Past Medical History  Diagnosis Date  . PUD (peptic ulcer disease) 1987    with h pylori  . Hepatic cyst   . Migraines   . Hypertension   . Hyperlipemia   . Colonic polyp   . Diverticulosis   . Helicobacter pylori gastritis 1987  . C. difficile colitis   . Acute kidney injury [facility])     MEDICATIONS: Current Outpatient Prescriptions on File Prior to Visit  Medication Sig Dispense Refill  . amLODipine (NORVASC) 5 MG tablet Take 1 tablet (5 mg total) by mouth daily. 90 tablet 3  . aspirin 81 MG tablet Take 81 mg by mouth daily.    . cholestyramine (QUESTRAN) 4 GM/DOSE powder TAKE 4 GRAMS BY MOUTH TWICE A DAY  5  . Coenzyme Q10 300 MG CAPS Take 1 capsule by mouth daily.     . cyanocobalamin 500 MCG tablet Take 500 mcg by mouth daily.    . cycloSPORINE (RESTASIS) 0.05 % ophthalmic emulsion Place 1 drop into both eyes 2 (two) times daily.     Marland Kitchen donepezil (ARICEPT) 5 MG tablet TAKE 1 TABLET (5 MG TOTAL) BY MOUTH AT BEDTIME. 90 tablet 1  . hydrALAZINE (APRESOLINE) 25 MG tablet Take 1 tablet (25 mg total) by mouth 2 (two) times daily. 180 tablet 0  . hydrochlorothiazide (HYDRODIURIL) 25 MG tablet Take 1 tablet (25 mg total) by mouth daily. 90 tablet 3  . LORazepam (ATIVAN) 0.5 MG tablet Take 1 tablet (0.5 mg total) by mouth at bedtime as needed. ---Pt should establish with new PCP for further refills 30 tablet 0  . losartan (COZAAR) 100 MG tablet Take 100 mg by mouth daily.  3  . losartan (COZAAR) 100 MG tablet TAKE 1 TABLET (100 MG TOTAL) BY MOUTH DAILY. 90 tablet 1  . metoprolol  tartrate (LOPRESSOR) 25 MG tablet TAKE 1 TABLET (25 MG TOTAL) BY MOUTH 2 (TWO) TIMES DAILY. 180 tablet 3  . omeprazole (PRILOSEC) 20 MG capsule Take 1 capsule by mouth daily as needed (heartburn).     . Probiotic Product (PROBIOTIC DAILY PO) Take by mouth daily.    Marland Kitchen saccharomyces boulardii (FLORASTOR) 250 MG capsule Take 1 capsule (250 mg total) by mouth 2 (two) times daily. 60 capsule 1  . sertraline (ZOLOFT) 100 MG tablet Take 1 tablet (100 mg total) by mouth daily. 90 tablet 3  . sertraline (ZOLOFT) 50 MG tablet TAKE 1 TABLET BY MOUTH DAILY 30 tablet 3  . UNABLE TO FIND Take by mouth daily. IB gard     No current facility-administered medications on file prior to visit.    ALLERGIES: Allergies  Allergen Reactions  . Mavik [Trandolapril]     02/22/15 cough    FAMILY HISTORY: Family History  Problem Relation Age of Onset  . Asthma Brother   .  Hypertension Father   . Heart attack Father 31  . Leukemia Mother   . Stomach cancer Maternal Aunt   . Breast cancer Sister   . Thyroid disease Sister   . Stroke Neg Hx   . Diabetes Neg Hx     SOCIAL HISTORY: Social History   Social History  . Marital Status: Married    Spouse Name: N/A  . Number of Children: 2  . Years of Education: N/A   Occupational History  . Not on file.   Social History Main Topics  . Smoking status: Former Smoker    Quit date: 12/10/1973  . Smokeless tobacco: Never Used  . Alcohol Use: 0.0 oz/week    0 Standard drinks or equivalent per week     Comment:  socially  . Drug Use: No  . Sexual Activity: Not on file   Other Topics Concern  . Not on file   Social History Narrative    REVIEW OF SYSTEMS: Constitutional: No fevers, chills, or sweats, no generalized fatigue, change in appetite Eyes: No visual changes, double vision, eye pain Ear, nose and throat: No hearing loss, ear pain, nasal congestion, sore throat Cardiovascular: No chest pain, palpitations Respiratory:  No shortness of breath  at rest or with exertion, wheezes GastrointestinaI: No nausea, vomiting, diarrhea, abdominal pain, fecal incontinence Genitourinary:  No dysuria, urinary retention or frequency Musculoskeletal:  No neck pain, back pain Integumentary: No rash, pruritus, skin lesions Neurological: as above Psychiatric: No depression, insomnia, anxiety Endocrine: No palpitations, fatigue, diaphoresis, mood swings, change in appetite, change in weight, increased thirst Hematologic/Lymphatic:  No anemia, purpura, petechiae. Allergic/Immunologic: no itchy/runny eyes, nasal congestion, recent allergic reactions, rashes  PHYSICAL EXAM: Filed Vitals:   03/22/16 1003  BP: 120/72  Pulse: 67  Resp: 16   General: No acute distress Head:  Normocephalic/atraumatic Neck: supple, no paraspinal tenderness, full range of motion Heart:  Regular rate and rhythm Lungs:  Clear to auscultation bilaterally Back: No paraspinal tenderness Skin/Extremities: No rash, no edema Neurological Exam: alert and oriented to person, place, and month/year/season. No aphasia or dysarthria. Fund of knowledge is appropriate.  Remote memory intact.  Attention and concentration are normal.    Able to name objects and repeat phrases. Clock drawing test 4/5 MMSE - Mini Mental State Exam 03/22/2016 09/21/2015  Orientation to time 3 2  Orientation to Place 4 5  Registration 3 3  Attention/ Calculation 4 5  Recall 0 1  Language- name 2 objects 2 2  Language- repeat 1 1  Language- follow 3 step command 3 3  Language- read & follow direction 1 1  Write a sentence 1 1  Copy design 1 1  Total score 23 25   Cranial nerves: Pupils equal, round, reactive to light. Extraocular movements intact with no nystagmus. Visual fields full. Facial sensation intact. No facial asymmetry. Tongue, uvula, palate midline.  Motor: Bulk and tone normal, muscle strength 5/5 throughout with no pronator drift.  Sensation to light touch intact.  No extinction to double  simultaneous stimulation.  Deep tendon reflexes +1 throughout, toes downgoing.  Finger to nose testing intact.  Gait narrow-based and steady, mild difficulty with tandem walk but able.  Romberg negative.  IMPRESSION: This is a pleasant 82 yo LH woman with vascular risk factors including hypertension, hyperlipidemia, with mild dementia. MMSE today 23/30 (25/30 in October 2016). She will increase Aricept dose to 10mg  daily. Side effects were discussed. It was recommended that she let her husband check on  the medications as she was unsure if she was taking Aricept. We also again discussed mood issues and irritability that family members have noticed, they will speak to her PCP about this. We again discussed control of vascular risk factors, the importance of physical exercise and brain stimulation exercises for brain health. She will follow-up in 6 months or earlier if needed.  Thank you for allowing me to participate in her care.  Please do not hesitate to call for any questions or concerns.  The duration of this appointment visit was 25 minutes of face-to-face time with the patient.  Greater than 50% of this time was spent in counseling, explanation of diagnosis, planning of further management, and coordination of care.   Ellouise Newer, M.D.   CC: Dr. Quay Burow

## 2016-03-30 ENCOUNTER — Encounter: Payer: Self-pay | Admitting: Internal Medicine

## 2016-03-30 ENCOUNTER — Other Ambulatory Visit (INDEPENDENT_AMBULATORY_CARE_PROVIDER_SITE_OTHER): Payer: Medicare Other

## 2016-03-30 ENCOUNTER — Ambulatory Visit (INDEPENDENT_AMBULATORY_CARE_PROVIDER_SITE_OTHER): Payer: Medicare Other | Admitting: Internal Medicine

## 2016-03-30 VITALS — BP 134/60 | HR 63 | Temp 98.5°F | Resp 16 | Wt 176.0 lb

## 2016-03-30 DIAGNOSIS — R5382 Chronic fatigue, unspecified: Secondary | ICD-10-CM | POA: Insufficient documentation

## 2016-03-30 DIAGNOSIS — I10 Essential (primary) hypertension: Secondary | ICD-10-CM | POA: Diagnosis not present

## 2016-03-30 DIAGNOSIS — F32A Depression, unspecified: Secondary | ICD-10-CM

## 2016-03-30 DIAGNOSIS — N189 Chronic kidney disease, unspecified: Secondary | ICD-10-CM | POA: Diagnosis not present

## 2016-03-30 DIAGNOSIS — F329 Major depressive disorder, single episode, unspecified: Secondary | ICD-10-CM | POA: Diagnosis not present

## 2016-03-30 LAB — CBC WITH DIFFERENTIAL/PLATELET
BASOS ABS: 0.1 10*3/uL (ref 0.0–0.1)
Basophils Relative: 0.4 % (ref 0.0–3.0)
Eosinophils Absolute: 0.1 10*3/uL (ref 0.0–0.7)
Eosinophils Relative: 1 % (ref 0.0–5.0)
HCT: 40.3 % (ref 36.0–46.0)
HEMOGLOBIN: 13.8 g/dL (ref 12.0–15.0)
LYMPHS ABS: 2.2 10*3/uL (ref 0.7–4.0)
Lymphocytes Relative: 15.3 % (ref 12.0–46.0)
MCHC: 34.1 g/dL (ref 30.0–36.0)
MCV: 89.3 fl (ref 78.0–100.0)
MONOS PCT: 7 % (ref 3.0–12.0)
Monocytes Absolute: 1 10*3/uL (ref 0.1–1.0)
NEUTROS PCT: 76.3 % (ref 43.0–77.0)
Neutro Abs: 10.7 10*3/uL — ABNORMAL HIGH (ref 1.4–7.7)
Platelets: 452 10*3/uL — ABNORMAL HIGH (ref 150.0–400.0)
RBC: 4.51 Mil/uL (ref 3.87–5.11)
RDW: 13.6 % (ref 11.5–15.5)
WBC: 14.1 10*3/uL — AB (ref 4.0–10.5)

## 2016-03-30 LAB — COMPREHENSIVE METABOLIC PANEL
ALBUMIN: 4.1 g/dL (ref 3.5–5.2)
ALK PHOS: 103 U/L (ref 39–117)
ALT: 15 U/L (ref 0–35)
AST: 14 U/L (ref 0–37)
BILIRUBIN TOTAL: 0.7 mg/dL (ref 0.2–1.2)
BUN: 19 mg/dL (ref 6–23)
CO2: 28 mEq/L (ref 19–32)
Calcium: 9.8 mg/dL (ref 8.4–10.5)
Chloride: 100 mEq/L (ref 96–112)
Creatinine, Ser: 1.13 mg/dL (ref 0.40–1.20)
GFR: 49.22 mL/min — AB (ref >60.00)
GLUCOSE: 97 mg/dL (ref 70–99)
Potassium: 4.1 mEq/L (ref 3.5–5.1)
SODIUM: 137 meq/L (ref 135–145)
TOTAL PROTEIN: 7.3 g/dL (ref 6.0–8.3)

## 2016-03-30 LAB — TSH: TSH: 1.88 u[IU]/mL (ref 0.35–4.50)

## 2016-03-30 NOTE — Assessment & Plan Note (Signed)
BP well controlled Current regimen effective and well tolerated Continue current medications at current doses  

## 2016-03-30 NOTE — Assessment & Plan Note (Signed)
Check cmp 

## 2016-03-30 NOTE — Progress Notes (Signed)
Pre visit review using our clinic review tool, if applicable. No additional management support is needed unless otherwise documented below in the visit note. 

## 2016-03-30 NOTE — Assessment & Plan Note (Signed)
Controlled - improved with increased dose of sertraline seems to have helped She does feel slightly depressed first thing in the morning, but this does not persist during the day and does not bother her Continue sertraline 100mg  daily

## 2016-03-30 NOTE — Assessment & Plan Note (Signed)
Does not seem to be related to her depression and her sleep is good Will check basic blood work

## 2016-03-30 NOTE — Progress Notes (Signed)
Subjective:    Patient ID: Toni [name], female    DOB: April 01, 1934, 82 y.o.   MRN: [id number]  HPI She is here for an acute visit for fatigue and sore throat.  Fatigue:  For the past few weeks she has been tired.  She also has a sore throat.  She had a recent viral cold and still has a slight residual cold.  She has some mild allergy symptoms as well.  Her husband has also had the cold symptoms and they are resolving.  She has dementia and is unsure how long the sore throat has been present and her husband states this is the first time she mentioned it.  She sleeps well and feels refreshed.    Depression: She is taking her medication daily as prescribed - we increased the dose at her last visit. She denies any side effects from the medication. She feels her depression is well controlled and she is happy with her current dose of medication. She did tell neurology that she feels depressed first thing in the morning, but that goes away after being up for a while. It does not bother her and her husband does not notice it.    Hypertension: She is taking her medication daily. She is compliant with a low sodium diet.  She denies chest pain, palpitations, edema, shortness of breath and regular headaches. She is not regularly.  She does not monitor her blood pressure at home.       Medications and allergies reviewed with patient and updated if appropriate.  Patient Active Problem List   Diagnosis Date Noted  . Mild dementia 03/22/2016  . Hyperlipidemia 03/22/2016  . GERD (gastroesophageal reflux disease) 12/26/2015  . Mild cognitive impairment 09/21/2015  . Depression 09/21/2015  . History of stroke 09/21/2015  . CKD (chronic kidney disease)   . Diarrhea 05/03/2015  . Lesion of right native kidney 04/13/2015  . Sepsis ([place]) 04/05/2015  . Diastolic CHF ([place]) [id number]  . History of Clostridium difficile infection 04/05/2015  . Blood poisoning ([place])   . Acute renal failure ([place])     . Pulmonary edema   . Essential hypertension 03/23/2015  . Abnormal CT of brain 03/04/2015  . Headache 11/09/2013  . Irregular heartbeat 11/09/2013  . Cervicogenic headache 01/22/2012  . Diverticulosis of large intestine 10/28/2009  . DIVERTICULITIS, HX OF 10/28/2009  . FASTING HYPERGLYCEMIA 03/07/2009  . COLONIC POLYPS, HX OF 03/07/2009  . ANXIETY STATE, UNSPECIFIED 07/28/2008  . DRY EYE SYNDROME 03/12/2008  . HYPERLIPIDEMIA 04/21/2007  . HEPATIC CYST 04/21/2007  . MIGRAINES, HX OF 04/21/2007    Current Outpatient Prescriptions on File Prior to Visit  Medication Sig Dispense Refill  . amLODipine (NORVASC) 5 MG tablet Take 1 tablet (5 mg total) by mouth daily. 90 tablet 3  . aspirin 81 MG tablet Take 81 mg by mouth daily.    . cholestyramine (QUESTRAN) 4 GM/DOSE powder TAKE 4 GRAMS BY MOUTH TWICE A DAY  5  . Coenzyme Q10 300 MG CAPS Take 1 capsule by mouth daily.     . cyanocobalamin 500 MCG tablet Take 500 mcg by mouth daily.    . cycloSPORINE (RESTASIS) 0.05 % ophthalmic emulsion Place 1 drop into both eyes 2 (two) times daily.     Marland Kitchen donepezil (ARICEPT) 10 MG tablet Take 1 tablet daily 90 tablet 3  . hydrALAZINE (APRESOLINE) 25 MG tablet Take 1 tablet (25 mg total) by mouth 2 (two) times daily. 180 tablet 0  .  hydrochlorothiazide (HYDRODIURIL) 25 MG tablet Take 1 tablet (25 mg total) by mouth daily. 90 tablet 3  . LORazepam (ATIVAN) 0.5 MG tablet Take 1 tablet (0.5 mg total) by mouth at bedtime as needed. ---Pt should establish with new PCP for further refills 30 tablet 0  . losartan (COZAAR) 100 MG tablet Take 100 mg by mouth daily.  3  . losartan (COZAAR) 100 MG tablet TAKE 1 TABLET (100 MG TOTAL) BY MOUTH DAILY. 90 tablet 1  . metoprolol tartrate (LOPRESSOR) 25 MG tablet TAKE 1 TABLET (25 MG TOTAL) BY MOUTH 2 (TWO) TIMES DAILY. 180 tablet 3  . omeprazole (PRILOSEC) 20 MG capsule Take 1 capsule by mouth daily as needed (heartburn).     . Probiotic Product (PROBIOTIC DAILY PO)  Take by mouth daily.    Marland Kitchen saccharomyces boulardii (FLORASTOR) 250 MG capsule Take 1 capsule (250 mg total) by mouth 2 (two) times daily. 60 capsule 1  . sertraline (ZOLOFT) 100 MG tablet Take 1 tablet (100 mg total) by mouth daily. 90 tablet 3  . sertraline (ZOLOFT) 50 MG tablet TAKE 1 TABLET BY MOUTH DAILY 30 tablet 3  . UNABLE TO FIND Take by mouth daily. IB gard     No current facility-administered medications on file prior to visit.    Past Medical History  Diagnosis Date  . PUD (peptic ulcer disease) 1987    with h pylori  . Hepatic cyst   . Migraines   . Hypertension   . Hyperlipemia   . Colonic polyp   . Diverticulosis   . Helicobacter pylori gastritis 1987  . C. difficile colitis   . Acute kidney injury [facility])     Past Surgical History  Procedure Laterality Date  . Abdominal hysterectomy      BSO ; endometriosis; ? Appendectomy incidentally  . Cholecystectomy  2008  . Rotator cuff repair      right  . Colonoscopy  2003 & 2012    polyps; Dr Collene Mares  . Endoscopy gastritis  2008  . Tonsillectomy and adenoidectomy      Social History   Social History  . Marital Status: Married    Spouse Name: N/A  . Number of Children: 2  . Years of Education: N/A   Social History Main Topics  . Smoking status: Former Smoker    Quit date: 12/10/1973  . Smokeless tobacco: Never Used  . Alcohol Use: 0.0 oz/week    0 Standard drinks or equivalent per week     Comment:  socially  . Drug Use: No  . Sexual Activity: Not Asked   Other Topics Concern  . None   Social History Narrative    Family History  Problem Relation Age of Onset  . Asthma Brother   . Hypertension Father   . Heart attack Father 55  . Leukemia Mother   . Stomach cancer Maternal Aunt   . Breast cancer Sister   . Thyroid disease Sister   . Stroke Neg Hx   . Diabetes Neg Hx     Review of Systems  Constitutional: Positive for appetite change (decreased). Negative for fever and chills.  HENT: Positive  for postnasal drip and sore throat. Negative for congestion, ear pain, sinus pressure and sneezing.   Respiratory: Positive for cough (mid dry ). Negative for shortness of breath and wheezing.   Cardiovascular: Positive for chest pain (chrest pressure from stress only - not new). Negative for palpitations and leg swelling.  Gastrointestinal:  No gerd  Neurological: Negative for light-headedness and headaches.       Objective:   Filed Vitals:   03/30/16 1622  BP: 134/60  Pulse: 63  Temp: 98.5 F (36.9 C)  Resp: 16   Filed Weights   03/30/16 1622  Weight: 176 lb (79.833 kg)   Body mass index is 29.29 kg/(m^2).   Physical Exam GENERAL APPEARANCE: Appears stated age, well appearing, NAD EYES: conjunctiva clear, no icterus HEENT: bilateral tympanic membranes and ear canals normal, oropharynx with no erythema, no thyromegaly, trachea midline, no cervical or supraclavicular lymphadenopathy LUNGS: Clear to auscultation without wheeze or crackles, unlabored breathing, good air entry bilaterally HEART: Normal S1,S2 without murmurs ABDOMEN: soft, nontender EXTREMITIES: Without clubbing, cyanosis, or edema Psych: normal mood and affect      Assessment & Plan:   See Problem List for Assessment and Plan of chronic medical problems.  F/u 6 months

## 2016-03-30 NOTE — Patient Instructions (Addendum)
  Test(s) ordered today. Your results will be released to MyChart (or called to you) after review, usually within 72hours after test completion. If any changes need to be made, you will be notified at that same time.  Medications reviewed and updated.  No changes recommended at this time.    Please followup in 6 months   

## 2016-03-31 ENCOUNTER — Encounter: Payer: Self-pay | Admitting: Internal Medicine

## 2016-04-03 DIAGNOSIS — K5730 Diverticulosis of large intestine without perforation or abscess without bleeding: Secondary | ICD-10-CM | POA: Diagnosis not present

## 2016-04-03 DIAGNOSIS — Z86010 Personal history of colonic polyps: Secondary | ICD-10-CM | POA: Diagnosis not present

## 2016-04-03 DIAGNOSIS — K580 Irritable bowel syndrome with diarrhea: Secondary | ICD-10-CM | POA: Diagnosis not present

## 2016-04-03 DIAGNOSIS — Z1211 Encounter for screening for malignant neoplasm of colon: Secondary | ICD-10-CM | POA: Diagnosis not present

## 2016-04-04 NOTE — Telephone Encounter (Signed)
Patient called regarding results. She states that she had not heard anything, but she was having trouble accessing her mychart. i updated her with the mychart info and the above note. She stated that she is still experiencing fatigue, but no other sx at this point,. Advised i'd let you know

## 2016-04-16 DIAGNOSIS — Z1212 Encounter for screening for malignant neoplasm of rectum: Secondary | ICD-10-CM | POA: Diagnosis not present

## 2016-04-16 DIAGNOSIS — Z1211 Encounter for screening for malignant neoplasm of colon: Secondary | ICD-10-CM | POA: Diagnosis not present

## 2016-04-16 LAB — COLOGUARD: Cologuard: POSITIVE

## 2016-04-18 ENCOUNTER — Telehealth: Payer: Self-pay | Admitting: Internal Medicine

## 2016-04-18 ENCOUNTER — Telehealth: Payer: Self-pay | Admitting: Family Medicine

## 2016-04-18 NOTE — Telephone Encounter (Signed)
Please advise 

## 2016-04-18 NOTE — Telephone Encounter (Signed)
Patients husband Mikki Santee called states that for the past week or so that patient is hardly eating. He states she is having trouble walking. States he's not sure if she is taking her medications or if she is taking too much of her medications. I did advise him to call patients pcp to get an appt to be seen as these issues needs to be addressed. He was questioning if there was a service that they could get some type of home assistance for the patient. I did tell him that once patient is seen they can make a recommendation/order for patient to have home health for med management. He verbalized good understanding and will call her pcp.

## 2016-04-18 NOTE — Telephone Encounter (Signed)
Spoke with pts husband, scheduled appt with Dr Quay Burow on 5/22. Instructed to call if pt was to get any worse with confusion and eating habits. Stated that he felt that home health was not needed that he had the medications taken care of.

## 2016-04-18 NOTE — Telephone Encounter (Signed)
Spouse has called in stating that patient is self medicating without letting him know what she is taking.  Patient is worried about this since patient does not remember things.  Also states that the only thing she has ate is half pop tart yesterday and not sure if she is eating while he is away.  States patient probably can not remember.  States patient is not eating enough.  States patient is having issues with diverticulities.  States she used a suppository this morning and then stated she was going to use imodium after having two normal bowel movements.  Spouse took this away from patient.  He is requesting an appointment to come in to see Dr. Quay Burow.  I have suggested they might want to consider ER.  Told spouse I would send a note back

## 2016-04-18 NOTE — Telephone Encounter (Signed)
She needs to be seen - this needs to be an extended visit.

## 2016-04-19 DIAGNOSIS — R627 Adult failure to thrive: Secondary | ICD-10-CM | POA: Diagnosis not present

## 2016-04-19 DIAGNOSIS — R109 Unspecified abdominal pain: Secondary | ICD-10-CM | POA: Diagnosis not present

## 2016-04-19 DIAGNOSIS — R194 Change in bowel habit: Secondary | ICD-10-CM | POA: Diagnosis not present

## 2016-04-19 DIAGNOSIS — R630 Anorexia: Secondary | ICD-10-CM | POA: Diagnosis not present

## 2016-04-20 ENCOUNTER — Encounter (HOSPITAL_COMMUNITY): Payer: Self-pay | Admitting: Emergency Medicine

## 2016-04-20 ENCOUNTER — Emergency Department (HOSPITAL_COMMUNITY)
Admission: EM | Admit: 2016-04-20 | Discharge: 2016-04-20 | Disposition: A | Discharge status: Home / Self Care | Payer: Medicare Other | Attending: Emergency Medicine | Admitting: Emergency Medicine

## 2016-04-20 ENCOUNTER — Telehealth: Payer: Self-pay | Admitting: Emergency Medicine

## 2016-04-20 DIAGNOSIS — N390 Urinary tract infection, site not specified: Secondary | ICD-10-CM | POA: Diagnosis not present

## 2016-04-20 DIAGNOSIS — D72829 Elevated white blood cell count, unspecified: Secondary | ICD-10-CM | POA: Diagnosis not present

## 2016-04-20 DIAGNOSIS — Z8619 Personal history of other infectious and parasitic diseases: Secondary | ICD-10-CM | POA: Diagnosis not present

## 2016-04-20 DIAGNOSIS — Z87891 Personal history of nicotine dependence: Secondary | ICD-10-CM | POA: Diagnosis not present

## 2016-04-20 DIAGNOSIS — Z7982 Long term (current) use of aspirin: Secondary | ICD-10-CM | POA: Diagnosis not present

## 2016-04-20 DIAGNOSIS — G43909 Migraine, unspecified, not intractable, without status migrainosus: Secondary | ICD-10-CM | POA: Insufficient documentation

## 2016-04-20 DIAGNOSIS — Z9049 Acquired absence of other specified parts of digestive tract: Secondary | ICD-10-CM | POA: Diagnosis not present

## 2016-04-20 DIAGNOSIS — Z79899 Other long term (current) drug therapy: Secondary | ICD-10-CM | POA: Diagnosis not present

## 2016-04-20 DIAGNOSIS — Z8711 Personal history of peptic ulcer disease: Secondary | ICD-10-CM | POA: Insufficient documentation

## 2016-04-20 DIAGNOSIS — R799 Abnormal finding of blood chemistry, unspecified: Secondary | ICD-10-CM | POA: Diagnosis present

## 2016-04-20 DIAGNOSIS — Z8719 Personal history of other diseases of the digestive system: Secondary | ICD-10-CM | POA: Diagnosis not present

## 2016-04-20 DIAGNOSIS — E785 Hyperlipidemia, unspecified: Secondary | ICD-10-CM | POA: Diagnosis not present

## 2016-04-20 DIAGNOSIS — Z86010 Personal history of colonic polyps: Secondary | ICD-10-CM | POA: Insufficient documentation

## 2016-04-20 DIAGNOSIS — I10 Essential (primary) hypertension: Secondary | ICD-10-CM | POA: Insufficient documentation

## 2016-04-20 LAB — DIFFERENTIAL
BASOS ABS: 0 10*3/uL (ref 0.0–0.1)
BASOS PCT: 0 %
Eosinophils Absolute: 0.1 10*3/uL (ref 0.0–0.7)
Eosinophils Relative: 0 %
LYMPHS PCT: 6 %
Lymphs Abs: 1.2 10*3/uL (ref 0.7–4.0)
MONO ABS: 1.3 10*3/uL — AB (ref 0.1–1.0)
Monocytes Relative: 7 %
NEUTROS ABS: 16.9 10*3/uL — AB (ref 1.7–7.7)
Neutrophils Relative %: 87 %

## 2016-04-20 LAB — URINE MICROSCOPIC-ADD ON

## 2016-04-20 LAB — URINALYSIS, ROUTINE W REFLEX MICROSCOPIC
Glucose, UA: NEGATIVE mg/dL
Ketones, ur: NEGATIVE mg/dL
Nitrite: NEGATIVE
Protein, ur: 100 mg/dL — AB
Specific Gravity, Urine: 1.023 (ref 1.005–1.030)
pH: 6 (ref 5.0–8.0)

## 2016-04-20 LAB — MAGNESIUM: Magnesium: 1.7 mg/dL (ref 1.7–2.4)

## 2016-04-20 LAB — CBC
HCT: 31.3 % — ABNORMAL LOW (ref 36.0–46.0)
Hemoglobin: 10.7 g/dL — ABNORMAL LOW (ref 12.0–15.0)
MCH: 29.3 pg (ref 26.0–34.0)
MCHC: 34.2 g/dL (ref 30.0–36.0)
MCV: 85.8 fL (ref 78.0–100.0)
Platelets: 536 10*3/uL — ABNORMAL HIGH (ref 150–400)
RBC: 3.65 MIL/uL — ABNORMAL LOW (ref 3.87–5.11)
RDW: 13.4 % (ref 11.5–15.5)
WBC: 19.3 10*3/uL — ABNORMAL HIGH (ref 4.0–10.5)

## 2016-04-20 LAB — LIPASE, BLOOD: Lipase: 22 U/L (ref 11–51)

## 2016-04-20 LAB — COMPREHENSIVE METABOLIC PANEL
ALT: 20 U/L (ref 14–54)
AST: 16 U/L (ref 15–41)
Albumin: 3 g/dL — ABNORMAL LOW (ref 3.5–5.0)
Alkaline Phosphatase: 155 U/L — ABNORMAL HIGH (ref 38–126)
Anion gap: 16 — ABNORMAL HIGH (ref 5–15)
BUN: 30 mg/dL — ABNORMAL HIGH (ref 6–20)
CO2: 22 mmol/L (ref 22–32)
Calcium: 8.8 mg/dL — ABNORMAL LOW (ref 8.9–10.3)
Chloride: 94 mmol/L — ABNORMAL LOW (ref 101–111)
Creatinine, Ser: 1.18 mg/dL — ABNORMAL HIGH (ref 0.44–1.00)
GFR calc Af Amer: 49 mL/min — ABNORMAL LOW (ref >60)
GFR calc non Af Amer: 42 mL/min — ABNORMAL LOW (ref >60)
Glucose, Bld: 115 mg/dL — ABNORMAL HIGH (ref 65–99)
Potassium: 3 mmol/L — ABNORMAL LOW (ref 3.5–5.1)
Sodium: 132 mmol/L — ABNORMAL LOW (ref 135–145)
Total Bilirubin: 0.7 mg/dL (ref 0.3–1.2)
Total Protein: 7 g/dL (ref 6.5–8.1)

## 2016-04-20 MED ORDER — POTASSIUM CHLORIDE IN NACL 20-0.9 MEQ/L-% IV SOLN
INTRAVENOUS | Status: AC
  Administered 2016-04-20: 13:00:00 via INTRAVENOUS
  Filled 2016-04-20: qty 1000

## 2016-04-20 MED ORDER — DEXTROSE 5 % IV SOLN
1.0000 g | Freq: Once | INTRAVENOUS | Status: AC
  Administered 2016-04-20: 1 g via INTRAVENOUS
  Filled 2016-04-20: qty 10

## 2016-04-20 MED ORDER — CEPHALEXIN 500 MG PO CAPS: 500.0000 mg | ORAL_CAPSULE | Freq: Three times a day (TID) | ORAL | Status: DC

## 2016-04-20 NOTE — ED Notes (Signed)
Pt ambulated to the BR with one assist. 

## 2016-04-20 NOTE — Discharge Instructions (Signed)

## 2016-04-20 NOTE — ED Notes (Addendum)
Family at bedside. 

## 2016-04-20 NOTE — ED Provider Notes (Signed)
CSN: [id number]     Arrival date & time 04/20/16  1054 History   First MD Initiated Contact with Patient 04/20/16 1201     Chief Complaint  Patient presents with  . WBC 21      (Consider location/radiation/quality/duration/timing/severity/associated sxs/prior Treatment) HPI   82yF presenting with her husband and son for evaluation of abnormal labs. I cannot readily review this but she reports WBC of 21. Ordered by her gastroenterologist, Dr Collene Mares. She reports several weeks of fatigue and not feeling like herself. Not much of an appetite. She doesn't feel like she has lost weight nor has she noticed a change in how her clothes fit though. No night sweats. No urinary complaints. No respiratory complaints. Some problems with her memory. Specifically, at times she has been confused and not taken her medications appropriately.   Past Medical History  Diagnosis Date  . PUD (peptic ulcer disease) 1987    with h pylori  . Hepatic cyst   . Migraines   . Hypertension   . Hyperlipemia   . Colonic polyp   . Diverticulosis   . Helicobacter pylori gastritis 1987  . C. difficile colitis   . Acute kidney injury [facility])    Past Surgical History  Procedure Laterality Date  . Abdominal hysterectomy      BSO ; endometriosis; ? Appendectomy incidentally  . Cholecystectomy  2008  . Rotator cuff repair      right  . Colonoscopy  2003 & 2012    polyps; Dr Collene Mares  . Endoscopy gastritis  2008  . Tonsillectomy and adenoidectomy     Family History  Problem Relation Age of Onset  . Asthma Brother   . Hypertension Father   . Heart attack Father 76  . Leukemia Mother   . Stomach cancer Maternal Aunt   . Breast cancer Sister   . Thyroid disease Sister   . Stroke Neg Hx   . Diabetes Neg Hx    Social History  Substance Use Topics  . Smoking status: Former Smoker    Quit date: 12/10/1973  . Smokeless tobacco: Never Used  . Alcohol Use: 0.0 oz/week    0 Standard drinks or equivalent per week      Comment:  socially   OB History    No data available     Review of Systems  All systems reviewed and negative, other than as noted in HPI.   Allergies  Mavik and Viberzi  Home Medications   Prior to Admission medications   Medication Sig Start Date End Date Taking? Authorizing Provider  amLODipine (NORVASC) 5 MG tablet Take 1 tablet (5 mg total) by mouth daily. 05/13/15  Yes Hendricks Limes, MD  aspirin 81 MG tablet Take 81 mg by mouth daily.   Yes Historical Provider, MD  cholestyramine (QUESTRAN) 4 GM/DOSE powder TAKE 4 GRAMS BY MOUTH TWICE A DAY 06/14/15  Yes Historical Provider, MD  Coenzyme Q10 300 MG CAPS Take 1 capsule by mouth daily.    Yes Historical Provider, MD  cyanocobalamin 500 MCG tablet Take 500 mcg by mouth daily.   Yes Historical Provider, MD  cycloSPORINE (RESTASIS) 0.05 % ophthalmic emulsion Place 1 drop into both eyes 2 (two) times daily.    Yes Historical Provider, MD  donepezil (ARICEPT) 10 MG tablet Take 1 tablet daily 03/22/16  Yes Cameron Sprang, MD  hydrALAZINE (APRESOLINE) 25 MG tablet Take 1 tablet (25 mg total) by mouth 2 (two) times daily. 12/26/15  Yes Stacy  Lorretta Harp, MD  hydrochlorothiazide (HYDRODIURIL) 25 MG tablet Take 1 tablet (25 mg total) by mouth daily. 05/13/15  Yes Hendricks Limes, MD  losartan (COZAAR) 100 MG tablet Take 100 mg by mouth daily. 02/22/15  Yes Historical Provider, MD  metoprolol tartrate (LOPRESSOR) 25 MG tablet TAKE 1 TABLET (25 MG TOTAL) BY MOUTH 2 (TWO) TIMES DAILY. 07/25/15  Yes Hendricks Limes, MD  omeprazole (PRILOSEC) 20 MG capsule Take 1 capsule by mouth daily as needed (heartburn).  10/13/13  Yes Historical Provider, MD  OVER THE COUNTER MEDICATION Take 1 capsule by mouth daily. Brain Health Vitamin   Yes Historical Provider, MD  Probiotic Product (PROBIOTIC DAILY PO) Take 1 tablet by mouth daily.    Yes Historical Provider, MD  saccharomyces boulardii (FLORASTOR) 250 MG capsule Take 1 capsule (250 mg total) by mouth 2 (two)  times daily. 05/06/15  Yes Robbie Lis, MD  sertraline (ZOLOFT) 100 MG tablet Take 1 tablet (100 mg total) by mouth daily. 12/26/15  Yes Binnie Rail, MD  UNABLE TO FIND Take 1 capsule by mouth daily. IB gard   Yes Historical Provider, MD  LORazepam (ATIVAN) 0.5 MG tablet Take 1 tablet (0.5 mg total) by mouth at bedtime as needed. ---Pt should establish with new PCP for further refills Patient not taking: Reported on 04/20/2016 10/12/15   Hendricks Limes, MD   BP 121/62 mmHg  Pulse 76  Temp(Src) 98.4 F (36.9 C) (Oral)  Resp 16  Ht 5\' 5"  (1.651 m)  SpO2 95% Physical Exam  Constitutional: She is oriented to person, place, and time. She appears well-developed and well-nourished. No distress.  HENT:  Head: Normocephalic and atraumatic.  Eyes: Conjunctivae are normal. Right eye exhibits no discharge. Left eye exhibits no discharge.  Neck: Neck supple.  Cardiovascular: Normal rate, regular rhythm and normal heart sounds.  Exam reveals no gallop and no friction rub.   No murmur heard. Pulmonary/Chest: Effort normal and breath sounds normal. No respiratory distress.  Abdominal: Soft. She exhibits no distension. There is tenderness.  Very mild tenderness across lower abdomen. No rebound or guarding. No distension.   Musculoskeletal: She exhibits no edema or tenderness.  Neurological: She is alert and oriented to person, place, and time. No cranial nerve deficit. She exhibits normal muscle tone. Coordination normal.  Skin: Skin is warm and dry.  Psychiatric: She has a normal mood and affect. Her behavior is normal. Thought content normal.  Nursing note and vitals reviewed.   ED Course  Procedures (including critical care time) Labs Review Labs Reviewed  COMPREHENSIVE METABOLIC PANEL - Abnormal; Notable for the following:    Sodium 132 (*)    Potassium 3.0 (*)    Chloride 94 (*)    Glucose, Bld 115 (*)    BUN 30 (*)    Creatinine, Ser 1.18 (*)    Calcium 8.8 (*)    Albumin 3.0 (*)     Alkaline Phosphatase 155 (*)    GFR calc non Af Amer 42 (*)    GFR calc Af Amer 49 (*)    Anion gap 16 (*)    All other components within normal limits  CBC - Abnormal; Notable for the following:    WBC 19.3 (*)    RBC 3.65 (*)    Hemoglobin 10.7 (*)    HCT 31.3 (*)    Platelets 536 (*)    All other components within normal limits  URINALYSIS, ROUTINE W REFLEX MICROSCOPIC (NOT AT [facility]) -  Abnormal; Notable for the following:    Color, Urine AMBER (*)    APPearance TURBID (*)    Hgb urine dipstick MODERATE (*)    Bilirubin Urine SMALL (*)    Protein, ur 100 (*)    Leukocytes, UA LARGE (*)    All other components within normal limits  URINE MICROSCOPIC-ADD ON - Abnormal; Notable for the following:    Squamous Epithelial / LPF 0-5 (*)    Bacteria, UA MANY (*)    All other components within normal limits  DIFFERENTIAL - Abnormal; Notable for the following:    Neutro Abs 16.9 (*)    Monocytes Absolute 1.3 (*)    All other components within normal limits  URINE CULTURE  LIPASE, BLOOD  MAGNESIUM    Imaging Review No results found. I have personally reviewed and evaluated these images and lab results as part of my medical decision-making.   EKG Interpretation None      MDM   Final diagnoses:  UTI (lower urinary tract infection)    82yF with leukocytosis and less specific complaints of generalized weakness, mild confusion, etc. I hope and suspect this is from a UTI. Urinalysis is consistent with one. No specific urinary complaints but, as we age, the symptoms of UTI are often more vague. I don't think it necessarily explains everything she has been experiencing recently, but it certainly could be contributing. Mild renal impairment/electrolyte abnormalities. Likely from poor PO intake recently. Will supplement and give some IVF. Dose of IV rocephin and prescription for continued abx.   At this point there is a plausible explanation and I do not feel she needs further  testing. Her neuro exam is nonfocal. She seems somewhat tired but answering all questions appropriately and joking around some. If this is indeed from UTI then she should improve as it's treated. If she and her family do not notice a significant difference in her symptoms by the end of the weekend then I would like for her to be re-evaluated.     Virgel Manifold, MD 04/22/16 [phone]

## 2016-04-20 NOTE — ED Notes (Signed)
Pt's husband reports pt had blood drawn yesterday and was told today that her WBC is 21.  Was instructed to come to the ED.  States pt has hx of GI bleed but reports her doctor states the elevated WBC is not coming from it.  She reports feeling weak.  Pt is A&Ox 4.

## 2016-04-20 NOTE — ED Notes (Signed)
Pt wheeled out with husband to car.

## 2016-04-20 NOTE — ED Notes (Signed)
Pt has been having "GI issues" since a year or 2 ago when she came into the hospital and caught Cdiff.  Pt was at her GI doctor for a follow up and blood was drawn. Pt has a white count of 21 that her GI doctor does not believe is related to her GI issues.  Pt states that she has felt fatigued and has had a loss of appetite in the last week.  Nausea but no vomiting or diarrhea.

## 2016-04-20 NOTE — Telephone Encounter (Signed)
Received call from pts GI doctor this AM for critical WBC count at 21K and pt was very weak. Called pts spouse to inform pt needed to go to ED to be evaluated per MDs request.

## 2016-04-21 LAB — URINE CULTURE

## 2016-04-30 ENCOUNTER — Other Ambulatory Visit (INDEPENDENT_AMBULATORY_CARE_PROVIDER_SITE_OTHER): Payer: Medicare Other

## 2016-04-30 ENCOUNTER — Ambulatory Visit (INDEPENDENT_AMBULATORY_CARE_PROVIDER_SITE_OTHER): Payer: Medicare Other | Admitting: Internal Medicine

## 2016-04-30 ENCOUNTER — Encounter: Payer: Self-pay | Admitting: Internal Medicine

## 2016-04-30 VITALS — BP 114/72 | HR 74 | Temp 98.5°F | Ht 68.0 in | Wt 170.0 lb

## 2016-04-30 DIAGNOSIS — R251 Tremor, unspecified: Secondary | ICD-10-CM

## 2016-04-30 DIAGNOSIS — N179 Acute kidney failure, unspecified: Secondary | ICD-10-CM

## 2016-04-30 DIAGNOSIS — N390 Urinary tract infection, site not specified: Secondary | ICD-10-CM

## 2016-04-30 DIAGNOSIS — R8271 Bacteriuria: Secondary | ICD-10-CM | POA: Insufficient documentation

## 2016-04-30 DIAGNOSIS — R5382 Chronic fatigue, unspecified: Secondary | ICD-10-CM

## 2016-04-30 LAB — COMPREHENSIVE METABOLIC PANEL
ALK PHOS: 95 U/L (ref 39–117)
ALT: 15 U/L (ref 0–35)
AST: 14 U/L (ref 0–37)
Albumin: 3.7 g/dL (ref 3.5–5.2)
BILIRUBIN TOTAL: 0.4 mg/dL (ref 0.2–1.2)
BUN: 13 mg/dL (ref 6–23)
CALCIUM: 9.1 mg/dL (ref 8.4–10.5)
CO2: 24 mEq/L (ref 19–32)
Chloride: 102 mEq/L (ref 96–112)
Creatinine, Ser: 0.86 mg/dL (ref 0.40–1.20)
GFR: 67.44 mL/min (ref >60.00)
GLUCOSE: 89 mg/dL (ref 70–99)
POTASSIUM: 4 meq/L (ref 3.5–5.1)
Sodium: 136 mEq/L (ref 135–145)
TOTAL PROTEIN: 6.9 g/dL (ref 6.0–8.3)

## 2016-04-30 LAB — CBC WITH DIFFERENTIAL/PLATELET
BASOS ABS: 0 10*3/uL (ref 0.0–0.1)
Basophils Relative: 0.5 % (ref 0.0–3.0)
EOS PCT: 1.8 % (ref 0.0–5.0)
Eosinophils Absolute: 0.2 10*3/uL (ref 0.0–0.7)
HEMATOCRIT: 33.9 % — AB (ref 36.0–46.0)
HEMOGLOBIN: 11.4 g/dL — AB (ref 12.0–15.0)
LYMPHS ABS: 2.4 10*3/uL (ref 0.7–4.0)
LYMPHS PCT: 21.7 % (ref 12.0–46.0)
MCHC: 33.6 g/dL (ref 30.0–36.0)
MCV: 87.7 fl (ref 78.0–100.0)
MONOS PCT: 3.7 % (ref 3.0–12.0)
Monocytes Absolute: 0.4 10*3/uL (ref 0.1–1.0)
Neutro Abs: 7.9 10*3/uL — ABNORMAL HIGH (ref 1.4–7.7)
Neutrophils Relative %: 72.3 % (ref 43.0–77.0)
Platelets: 562 10*3/uL — ABNORMAL HIGH (ref 150.0–400.0)
RBC: 3.87 Mil/uL (ref 3.87–5.11)
RDW: 14.1 % (ref 11.5–15.5)
WBC: 10.9 10*3/uL — ABNORMAL HIGH (ref 4.0–10.5)

## 2016-04-30 NOTE — Assessment & Plan Note (Signed)
Acute on chronic fatigue She does have some chronic fatigue, but it was worse with this infection Blood work prior to the infection did not reveal a cause Recheck CMP, CMP Encouraged regular exercise-walking

## 2016-04-30 NOTE — Patient Instructions (Addendum)
  Test(s) ordered today. Your results will be released to MyChart (or called to you) after review, usually within 72hours after test completion. If any changes need to be made, you will be notified at that same time.  Medications reviewed and updated.  No changes recommended at this time.    Please followup in 6 months   

## 2016-04-30 NOTE — Progress Notes (Signed)
Subjective:    Patient ID: Toni [name], female    DOB: 19-Jan-1934, 82 y.o.   MRN: [id number]  HPI She is here for follow up.   She went to the ED for Anorexia, fatigue, generalized weakness and increased confusion. She was found to have an elevated white blood cell count by her gastroenterologist. Workup in the emergency room, she had a urinary tract infection despite having no urinary symptoms. She did have some mild renal impairment and electrolyte abnormalities. She received IV fluids and IV antibiotics. She was discharged home on oral antibiotics, which she completed.  She is much better but still weak and still with a low appetite.  She tires easily. She does have a chronic complaint of fatigue, but her energy level is lower than usual. In the morning she has shakes-these typically last 30 minutes and then she does not have the rest of the day. She thinks they have been going on for approximately 1 month.  Her husband also notices that she tenses up periodically and will relax slowly.       She otherwise feels fine and has no complaints. She is not currently exercising, but her husband is trying to get her to walk more often.  Medications and allergies reviewed with patient and updated if appropriate.  Patient Active Problem List   Diagnosis Date Noted  . Chronic fatigue 03/30/2016  . Mild dementia 03/22/2016  . Hyperlipidemia 03/22/2016  . GERD (gastroesophageal reflux disease) 12/26/2015  . Mild cognitive impairment 09/21/2015  . Depression 09/21/2015  . History of stroke 09/21/2015  . CKD (chronic kidney disease)   . Diarrhea 05/03/2015  . Lesion of right native kidney 04/13/2015  . Sepsis ([place]) 04/05/2015  . Diastolic CHF ([place]) [id number]  . History of Clostridium difficile infection 04/05/2015  . Blood poisoning ([place])   . Acute renal failure ([place])   . Pulmonary edema   . Essential hypertension 03/23/2015  . Abnormal CT of brain 03/04/2015  . Headache  11/09/2013  . Irregular heartbeat 11/09/2013  . Cervicogenic headache 01/22/2012  . Diverticulosis of large intestine 10/28/2009  . DIVERTICULITIS, HX OF 10/28/2009  . FASTING HYPERGLYCEMIA 03/07/2009  . COLONIC POLYPS, HX OF 03/07/2009  . ANXIETY STATE, UNSPECIFIED 07/28/2008  . DRY EYE SYNDROME 03/12/2008  . HYPERLIPIDEMIA 04/21/2007  . HEPATIC CYST 04/21/2007  . MIGRAINES, HX OF 04/21/2007    Current Outpatient Prescriptions on File Prior to Visit  Medication Sig Dispense Refill  . amLODipine (NORVASC) 5 MG tablet Take 1 tablet (5 mg total) by mouth daily. 90 tablet 3  . aspirin 81 MG tablet Take 81 mg by mouth daily.    . cholestyramine (QUESTRAN) 4 GM/DOSE powder TAKE 4 GRAMS BY MOUTH TWICE A DAY  5  . Coenzyme Q10 300 MG CAPS Take 1 capsule by mouth daily.     . cyanocobalamin 500 MCG tablet Take 500 mcg by mouth daily.    . cycloSPORINE (RESTASIS) 0.05 % ophthalmic emulsion Place 1 drop into both eyes 2 (two) times daily.     Marland Kitchen donepezil (ARICEPT) 10 MG tablet Take 1 tablet daily 90 tablet 3  . hydrALAZINE (APRESOLINE) 25 MG tablet Take 1 tablet (25 mg total) by mouth 2 (two) times daily. 180 tablet 0  . hydrochlorothiazide (HYDRODIURIL) 25 MG tablet Take 1 tablet (25 mg total) by mouth daily. 90 tablet 3  . LORazepam (ATIVAN) 0.5 MG tablet Take 1 tablet (0.5 mg total) by mouth at bedtime as needed. ---Pt should  establish with new PCP for further refills 30 tablet 0  . losartan (COZAAR) 100 MG tablet Take 100 mg by mouth daily.  3  . metoprolol tartrate (LOPRESSOR) 25 MG tablet TAKE 1 TABLET (25 MG TOTAL) BY MOUTH 2 (TWO) TIMES DAILY. 180 tablet 3  . omeprazole (PRILOSEC) 20 MG capsule Take 1 capsule by mouth daily as needed (heartburn).     Marland Kitchen OVER THE COUNTER MEDICATION Take 1 capsule by mouth daily. Brain Health Vitamin    . Probiotic Product (PROBIOTIC DAILY PO) Take 1 tablet by mouth daily.     Marland Kitchen saccharomyces boulardii (FLORASTOR) 250 MG capsule Take 1 capsule (250 mg  total) by mouth 2 (two) times daily. 60 capsule 1  . sertraline (ZOLOFT) 100 MG tablet Take 1 tablet (100 mg total) by mouth daily. 90 tablet 3  . UNABLE TO FIND Take 1 capsule by mouth daily. IB gard    . cephALEXin (KEFLEX) 500 MG capsule Take 1 capsule (500 mg total) by mouth 3 (three) times daily. (Patient not taking: Reported on 04/30/2016) 15 capsule 0   No current facility-administered medications on file prior to visit.    Past Medical History  Diagnosis Date  . PUD (peptic ulcer disease) 1987    with h pylori  . Hepatic cyst   . Migraines   . Hypertension   . Hyperlipemia   . Colonic polyp   . Diverticulosis   . Helicobacter pylori gastritis 1987  . C. difficile colitis   . Acute kidney injury [facility])     Past Surgical History  Procedure Laterality Date  . Abdominal hysterectomy      BSO ; endometriosis; ? Appendectomy incidentally  . Cholecystectomy  2008  . Rotator cuff repair      right  . Colonoscopy  2003 & 2012    polyps; Dr Collene Mares  . Endoscopy gastritis  2008  . Tonsillectomy and adenoidectomy      Social History   Social History  . Marital Status: Married    Spouse Name: N/A  . Number of Children: 2  . Years of Education: N/A   Social History Main Topics  . Smoking status: Former Smoker    Quit date: 12/10/1973  . Smokeless tobacco: Never Used  . Alcohol Use: 0.0 oz/week    0 Standard drinks or equivalent per week     Comment:  socially  . Drug Use: No  . Sexual Activity: Not Asked   Other Topics Concern  . None   Social History Narrative    Family History  Problem Relation Age of Onset  . Asthma Brother   . Hypertension Father   . Heart attack Father 47  . Leukemia Mother   . Stomach cancer Maternal Aunt   . Breast cancer Sister   . Thyroid disease Sister   . Stroke Neg Hx   . Diabetes Neg Hx     Review of Systems  Constitutional: Positive for appetite change and fatigue. Negative for fever and chills.  Respiratory: Negative for  cough, shortness of breath and wheezing.   Cardiovascular: Negative for chest pain, palpitations and leg swelling.  Gastrointestinal: Positive for abdominal pain (occasional in lower abdomen - diverticulosis or constipation) and constipation (controlled with fiber). Negative for nausea.  Genitourinary: Negative for dysuria, frequency and hematuria.  Neurological: Positive for headaches (sinus ). Negative for dizziness and light-headedness.       Unbalanced in morning  Psychiatric/Behavioral: Negative for sleep disturbance.  Objective:   Filed Vitals:   04/30/16 1423  BP: 114/72  Pulse: 74  Temp: 98.5 F (36.9 C)   Filed Weights   04/30/16 1423  Weight: 170 lb (77.111 kg)   Body mass index is 25.85 kg/(m^2).   Physical Exam Constitutional: Appears well-developed and well-nourished. No distress.  Neck: Neck supple. No tracheal deviation present. No thyromegaly present.  No carotid bruit. No cervical adenopathy.   Cardiovascular: Normal rate, regular rhythm and normal heart sounds.   No murmur heard.  No edema Pulmonary/Chest: Effort normal and breath sounds normal. No respiratory distress. No wheezes.  Abdomen: soft, non tender, non distended       Assessment & Plan:   See Problem List for Assessment and Plan of chronic medical problems.

## 2016-04-30 NOTE — Assessment & Plan Note (Signed)
Acute on chronic and her recent hospital stay Related to UTI, poor oral intake Recheck CMP

## 2016-04-30 NOTE — Assessment & Plan Note (Signed)
She is asymptomatic and her energy level and appetite are improving Completed treatment Discussed with her and her husband that she may have a known urinary tract infection in the future and most likely will not have urinary symptoms-advised for change in appetite, fever, confusion

## 2016-04-30 NOTE — Progress Notes (Signed)
Pre visit review using our clinic review tool, if applicable. No additional management support is needed unless otherwise documented below in the visit note. 

## 2016-04-30 NOTE — Assessment & Plan Note (Signed)
For the past month or so she has been experiencing shakes that lasted about 30 minutes in the morning and if she does not experience it on the rest of today The only medication approximately one month ago was increase sertraline from 50 mg daily to 100 mg daily The symptoms do not seem to be related to that Her mood is better with the increase sertraline to we will continue Continue to monitor shakes and off-balance feeling She has been on her sleep medication for a long time that does not seem that these are side effects from medication We'll monitor

## 2016-05-01 ENCOUNTER — Encounter: Payer: Self-pay | Admitting: Internal Medicine

## 2016-05-04 ENCOUNTER — Telehealth: Payer: Self-pay | Admitting: Internal Medicine

## 2016-05-04 DIAGNOSIS — K921 Melena: Secondary | ICD-10-CM

## 2016-05-04 NOTE — Telephone Encounter (Signed)
Please call her and let her know her cologuard was positive for blood.  She should see GI for further evaluation/consider of further testing.  I will put in a referral if she agrees.

## 2016-05-04 NOTE — Telephone Encounter (Signed)
GI referral entered

## 2016-05-04 NOTE — Telephone Encounter (Signed)
LVM for pt to call back as soon as possible.   RE: MD result below.

## 2016-05-04 NOTE — Telephone Encounter (Signed)
Spoke to patient. She advised to move forward with GI referral

## 2016-05-09 ENCOUNTER — Telehealth: Payer: Self-pay | Admitting: Gastroenterology

## 2016-05-09 DIAGNOSIS — B078 Other viral warts: Secondary | ICD-10-CM | POA: Diagnosis not present

## 2016-05-09 DIAGNOSIS — D225 Melanocytic nevi of trunk: Secondary | ICD-10-CM | POA: Diagnosis not present

## 2016-05-09 NOTE — Telephone Encounter (Signed)
Received records from Dr. Collene Mares office. Records indicate that patient has not been seen within the past year. Patient has seen Dr. Fuller Plan in the ED. Records placed on Dr. Lynne Leader desk for review.

## 2016-05-14 ENCOUNTER — Telehealth: Payer: Self-pay

## 2016-05-14 NOTE — Telephone Encounter (Signed)
The GI floor called about a referral that was sent to them about her cologuard was positive. But they do not see the result paper in epic anyway. Do you know where this is, if it has been scan in yet or what. Please follow up. Then they will schedule her a app with them

## 2016-05-14 NOTE — Telephone Encounter (Signed)
Paper results from Cologuard were sent to scan last week. Checked Cologuard website for results and did not see them.

## 2016-05-16 ENCOUNTER — Telehealth: Payer: Self-pay

## 2016-05-16 ENCOUNTER — Telehealth: Payer: Self-pay | Admitting: Emergency Medicine

## 2016-05-16 NOTE — Telephone Encounter (Signed)
There seems to be some confusion. I spoke with Dr. Collene Mares and patient had previously refused a colonoscopy. cologuard now positive.  I had referred her to Dr Collene Mares to have discussion regarding colonoscopy -- she has requested or been referred accidentally to GI upstairs - not sure if this is her wish or a mistake.  Please call patient/her husband  --  Will she consider a colonoscopy - if yes she should see GI to discuss.  If she does not want to consider a colonoscopy there is no need to see GI.

## 2016-05-16 NOTE — Telephone Encounter (Signed)
Dr. Fuller Plan reviewed records and has declined to accept patient. I informed patient of this.

## 2016-05-16 NOTE — Telephone Encounter (Signed)
Please call Dr Collene Mares at [facility] @ [phone] ASAP

## 2016-05-16 NOTE — Telephone Encounter (Signed)
Dr. Tammi Klippel called for Dr.Burns? I did not get her number from her. She was very fast on the phone. ?

## 2016-05-17 NOTE — Telephone Encounter (Signed)
Spoke with pt, husband was not home to talk to, informed her of what was gong on. Pt states she will never go back to [facility] and would only use Dr Collene Mares. Instructed pt to call Dr Lorie Apley office if she wished to go through with the Colonoscopy and set up an appt with them. Spoke with Magda Paganini CMA in [facility] to inform that pt would not be seeing their office. They are going to cancel referral.   LVM for husband to call back and discuss.

## 2016-05-21 ENCOUNTER — Other Ambulatory Visit: Payer: Self-pay | Admitting: Gastroenterology

## 2016-05-21 ENCOUNTER — Telehealth: Payer: Self-pay | Admitting: Internal Medicine

## 2016-05-21 DIAGNOSIS — R350 Frequency of micturition: Secondary | ICD-10-CM

## 2016-05-21 NOTE — Telephone Encounter (Signed)
Please call patient - let her know I would like her to give a urine sample to make sure the infection has completely cleared.  Ua, Ucx ordered - she can go to lab and off sample.

## 2016-05-23 NOTE — Telephone Encounter (Signed)
Pt agreed to have husband to bring her in for UA

## 2016-05-24 ENCOUNTER — Encounter (HOSPITAL_COMMUNITY): Payer: Self-pay | Admitting: *Deleted

## 2016-05-25 ENCOUNTER — Other Ambulatory Visit (INDEPENDENT_AMBULATORY_CARE_PROVIDER_SITE_OTHER): Payer: Medicare Other

## 2016-05-25 DIAGNOSIS — R350 Frequency of micturition: Secondary | ICD-10-CM

## 2016-05-25 LAB — URINALYSIS, ROUTINE W REFLEX MICROSCOPIC
Bilirubin Urine: NEGATIVE
NITRITE: POSITIVE — AB
SPECIFIC GRAVITY, URINE: 1.01 (ref 1.000–1.030)
TOTAL PROTEIN, URINE-UPE24: 30 — AB
URINE GLUCOSE: NEGATIVE
UROBILINOGEN UA: 0.2 (ref 0.0–1.0)
pH: 6 (ref 5.0–8.0)

## 2016-05-27 ENCOUNTER — Other Ambulatory Visit: Payer: Self-pay | Admitting: Internal Medicine

## 2016-05-27 LAB — URINE CULTURE

## 2016-05-27 MED ORDER — SULFAMETHOXAZOLE-TRIMETHOPRIM 800-160 MG PO TABS: 1.0000 | ORAL_TABLET | Freq: Two times a day (BID) | ORAL | Status: DC

## 2016-05-31 ENCOUNTER — Ambulatory Visit (HOSPITAL_COMMUNITY): Payer: Medicare Other | Admitting: Certified Registered Nurse Anesthetist

## 2016-05-31 ENCOUNTER — Ambulatory Visit (HOSPITAL_COMMUNITY)
Admission: RE | Admit: 2016-05-31 | Discharge: 2016-05-31 | Disposition: A | Discharge status: Home / Self Care | Payer: Medicare Other | Source: Ambulatory Visit | Attending: Gastroenterology | Admitting: Gastroenterology

## 2016-05-31 ENCOUNTER — Encounter (HOSPITAL_COMMUNITY)
Admission: RE | Disposition: A | Discharge status: Home / Self Care | Payer: Self-pay | Source: Ambulatory Visit | Attending: Gastroenterology

## 2016-05-31 ENCOUNTER — Encounter (HOSPITAL_COMMUNITY): Payer: Self-pay | Admitting: *Deleted

## 2016-05-31 DIAGNOSIS — F329 Major depressive disorder, single episode, unspecified: Secondary | ICD-10-CM | POA: Diagnosis not present

## 2016-05-31 DIAGNOSIS — Z7982 Long term (current) use of aspirin: Secondary | ICD-10-CM | POA: Diagnosis not present

## 2016-05-31 DIAGNOSIS — Z79899 Other long term (current) drug therapy: Secondary | ICD-10-CM | POA: Insufficient documentation

## 2016-05-31 DIAGNOSIS — F419 Anxiety disorder, unspecified: Secondary | ICD-10-CM | POA: Insufficient documentation

## 2016-05-31 DIAGNOSIS — I110 Hypertensive heart disease with heart failure: Secondary | ICD-10-CM | POA: Diagnosis not present

## 2016-05-31 DIAGNOSIS — K219 Gastro-esophageal reflux disease without esophagitis: Secondary | ICD-10-CM | POA: Insufficient documentation

## 2016-05-31 DIAGNOSIS — Z8711 Personal history of peptic ulcer disease: Secondary | ICD-10-CM | POA: Insufficient documentation

## 2016-05-31 DIAGNOSIS — Z87891 Personal history of nicotine dependence: Secondary | ICD-10-CM | POA: Insufficient documentation

## 2016-05-31 DIAGNOSIS — K5730 Diverticulosis of large intestine without perforation or abscess without bleeding: Secondary | ICD-10-CM | POA: Insufficient documentation

## 2016-05-31 DIAGNOSIS — N189 Chronic kidney disease, unspecified: Secondary | ICD-10-CM | POA: Diagnosis not present

## 2016-05-31 DIAGNOSIS — R195 Other fecal abnormalities: Secondary | ICD-10-CM | POA: Diagnosis not present

## 2016-05-31 DIAGNOSIS — I509 Heart failure, unspecified: Secondary | ICD-10-CM | POA: Diagnosis not present

## 2016-05-31 DIAGNOSIS — Z9049 Acquired absence of other specified parts of digestive tract: Secondary | ICD-10-CM | POA: Insufficient documentation

## 2016-05-31 DIAGNOSIS — R194 Change in bowel habit: Secondary | ICD-10-CM | POA: Insufficient documentation

## 2016-05-31 DIAGNOSIS — R1030 Lower abdominal pain, unspecified: Secondary | ICD-10-CM | POA: Diagnosis not present

## 2016-05-31 DIAGNOSIS — I129 Hypertensive chronic kidney disease with stage 1 through stage 4 chronic kidney disease, or unspecified chronic kidney disease: Secondary | ICD-10-CM | POA: Diagnosis not present

## 2016-05-31 DIAGNOSIS — Z1211 Encounter for screening for malignant neoplasm of colon: Secondary | ICD-10-CM | POA: Diagnosis not present

## 2016-05-31 HISTORY — DX: Headache, unspecified: R51.9

## 2016-05-31 HISTORY — PX: FLEXIBLE SIGMOIDOSCOPY: SHX5431

## 2016-05-31 HISTORY — DX: Headache: R51

## 2016-05-31 SURGERY — SIGMOIDOSCOPY, FLEXIBLE
Anesthesia: Monitor Anesthesia Care

## 2016-05-31 MED ORDER — ONDANSETRON HCL 4 MG/2ML IJ SOLN
INTRAMUSCULAR | Status: DC | PRN
  Administered 2016-05-31: 4 mg via INTRAVENOUS

## 2016-05-31 MED ORDER — ONDANSETRON HCL 4 MG/2ML IJ SOLN
INTRAMUSCULAR | Status: AC
  Filled 2016-05-31: qty 2

## 2016-05-31 MED ORDER — LIDOCAINE HCL (CARDIAC) 20 MG/ML IV SOLN
INTRAVENOUS | Status: AC
  Filled 2016-05-31: qty 5

## 2016-05-31 MED ORDER — GLYCOPYRROLATE 0.2 MG/ML IJ SOLN
INTRAMUSCULAR | Status: DC | PRN
  Administered 2016-05-31: 0.2 mg via INTRAVENOUS

## 2016-05-31 MED ORDER — PROPOFOL 10 MG/ML IV BOLUS
INTRAVENOUS | Status: AC
  Filled 2016-05-31: qty 40

## 2016-05-31 MED ORDER — LIDOCAINE HCL (CARDIAC) 20 MG/ML IV SOLN
INTRAVENOUS | Status: DC | PRN
  Administered 2016-05-31: 80 mg via INTRATRACHEAL

## 2016-05-31 MED ORDER — SODIUM CHLORIDE 0.9 % IV SOLN: INTRAVENOUS | Status: DC

## 2016-05-31 MED ORDER — LACTATED RINGERS IV SOLN
INTRAVENOUS | Status: DC
Start: 2016-05-31 — End: 2016-05-31
  Administered 2016-05-31: 1000 mL via INTRAVENOUS

## 2016-05-31 MED ORDER — PROPOFOL 500 MG/50ML IV EMUL
INTRAVENOUS | Status: DC | PRN
  Administered 2016-05-31: 200 ug/kg/min via INTRAVENOUS

## 2016-05-31 SURGICAL SUPPLY — 21 items

## 2016-05-31 NOTE — Transfer of Care (Signed)
Immediate Anesthesia Transfer of Care Note  Patient: Toni LAFEVERS  Procedure(s) Performed: Procedure(s): COLONOSCOPY WITH PROPOFOL (N/A)  Patient Location: PACU  Anesthesia Type:MAC  Level of Consciousness:  sedated, patient cooperative and responds to stimulation  Airway & Oxygen Therapy:Patient Spontanous Breathing and Patient connected to face mask oxgen  Post-op Assessment:  Report given to PACU RN and Post -op Vital signs reviewed and stable  Post vital signs:  Reviewed and stable  Last Vitals:  Filed Vitals:   05/31/16 0645 05/31/16 0756  BP: 116/63 178/78  Pulse: 79 94  Temp: 36.5 C 36.7 C  Resp: 20 25    Complications: No apparent anesthesia complications

## 2016-05-31 NOTE — Op Note (Addendum)
[facility] Patient Name: Toni [name] Procedure Date: 05/31/2016 MRN: [id number] Attending MD: Juanita Craver , MD Date of Birth: 1934-09-24 CSN: [id number] Age: 82 Admit Type: Outpatient Procedure:                Colonoscopy changed to a flexible sigmoidoscopy. Indications:              Change in bowel habits; CRC screening for                            colorectal malignant neoplasm. Providers:                Juanita Craver, MD, Laverta Baltimore, RN, [facility], Technician, Virgia Land, CRNA. Referring MD:             Binnie Rail, MD Medicines:                Monitored Anesthesia Care Complications:            No immediate complications. Estimated Blood Loss:     Estimated blood loss: none. Procedure:                Pre-Anesthesia Assessment: - Prior to the                            procedure, a history and physical was performed,                            and patient medications and allergies were                            reviewed. The patient's tolerance of previous                            anesthesia was also reviewed. The risks and                            benefits of the procedure and the sedation options                            and risks were discussed with the patient. All                            questions were answered, and informed consent was                            obtained. Prior anticoagulants: The patient has                            taken aspirin, last dose was 5 days prior to                            procedure. ASA Grade assessment: III - A patient  with severe systemic disease. After reviewing the                            risks and benefits, the patient was deemed in                            satisfactory condition to undergo the procedure.                            After obtaining informed consent, the colonoscope                            was passed under  direct vision. Throughout the                            procedure, the patient's blood pressure, pulse, and                            oxygen saturations were monitored continuously. The                            [id number] scope was introduced through                            the anus with the intention of advancing to the                            cecum. The scope was advanced to the sigmoid colon                            before the procedure was aborted. Medications were                            given. The [id number] scope was introduced                            through the and advanced to the. The colonoscopy                            was extremely difficult due to inadequate bowel                            prep. Successful completion of the procedure was                            aided by lavage. The patient tolerated the                            procedure well. The quality of the bowel                            preparation was poor. The rectum was photographed. Scope In: Scope Out: Findings:      Few small  and large-mouthed diverticula were found in the sigmoid       colon-inspiter of multiple attempts, the colon could not be insufflated       and the scope could be advanced beyond the sigmoid colon; the procedure       was aborted at this point. Impression:               - Preparation of the colon was poor.                           - Few scattered diverticula in the sigmoid colon;                            procedure aborted as insufflation was difficult and                            there was a lot of dresidual debris in the colon.                           - No specimens collected. Moderate Sedation:      N/A- Per Anesthesia Care Recommendation:           - High fiber diet and augmented water consumption                            daily.                           - Continue present medications.                           - We will try get a  virtual colonoscopy today if                            possible.                           - Return to GI office in 2 weeks. Procedure Code(s):        --- Professional ---                           [phone], Sigmoidoscopy, flexible; diagnostic,                            including collection of specimen(s) by brushing or                            washing, when performed (separate procedure) Diagnosis Code(s):        --- Professional ---                           R19.4, Change in bowel habit                           K57.30, Diverticulosis of large intestine without  perforation or abscess without bleeding                           Z12.11, Encounter for screening for malignant                            neoplasm of colon CPT copyright 2016 [employer]. All rights reserved. The codes documented in this report are preliminary and upon coder review may  be revised to meet current compliance requirements. Juanita Craver, MD Juanita Craver, MD 05/31/2016 8:11:19 AM This report has been signed electronically. Number of Addenda: 0

## 2016-05-31 NOTE — Discharge Instructions (Signed)
Flexible Sigmoidoscopy, Care After  Refer to this sheet in the next few weeks. These instructions provide you with information on caring for yourself after your procedure. Your health care provider may also give you more specific instructions. Your treatment has been planned according to current medical practices, but problems sometimes occur. Call your health care provider if you have any problems or questions after your procedure.  WHAT TO EXPECT AFTER THE PROCEDURE  After your procedure, it is typical to have the following:   · Abdominal cramps.  · Bloating.  · A small amount of rectal bleeding if you had a biopsy.  HOME CARE INSTRUCTIONS  · Only take over-the-counter or prescription medicines for pain, fever, or discomfort as directed by your health care provider.  · Resume your normal diet and activities as directed by your health care provider.  SEEK MEDICAL CARE IF:  · You have abdominal pain or cramping that lasts longer than 1 hour after the procedure.  · You continue to have small amounts of rectal bleeding after 24 hours.  · You have nausea or vomiting.  · You feel weak or dizzy.  SEEK IMMEDIATE MEDICAL CARE IF:   · You have a fever.  · You pass large blood clots or see a large amount of blood in the toilet after having a bowel movement. This may also occur 10-14 days after the procedure. It is more likely if you had a biopsy.  · You develop abdominal pain that is not relieved with medicine or your abdominal pain gets worse.  · You have nausea or vomiting for more than 24 hours after the procedure.     This information is not intended to replace advice given to you by your health care provider. Make sure you discuss any questions you have with your health care provider.     Document Released: 12/01/2013 Document Reviewed: 12/01/2013  Elsevier Interactive Patient Education ©2016 [employer]

## 2016-05-31 NOTE — Anesthesia Preprocedure Evaluation (Signed)
Anesthesia Evaluation  Patient identified by MRN, date of birth, ID band Patient awake    Reviewed: Allergy & Precautions, NPO status , Patient's Chart, lab work & pertinent test results  Airway Mallampati: II  TM Distance: >3 FB Neck ROM: Full    Dental no notable dental hx.    Pulmonary neg pulmonary ROS, former smoker,    Pulmonary exam normal breath sounds clear to auscultation       Cardiovascular hypertension, Pt. on medications and Pt. on home beta blockers +CHF  Normal cardiovascular exam Rhythm:Regular Rate:Normal     Neuro/Psych  Headaches, PSYCHIATRIC DISORDERS Anxiety Depression    GI/Hepatic Neg liver ROS, PUD, GERD  ,  Endo/Other  negative endocrine ROS  Renal/GU Renal disease  negative genitourinary   Musculoskeletal negative musculoskeletal ROS (+)   Abdominal   Peds negative pediatric ROS (+)  Hematology negative hematology ROS (+)   Anesthesia Other Findings   Reproductive/Obstetrics negative OB ROS                             Anesthesia Physical Anesthesia Plan  ASA: III  Anesthesia Plan: MAC   Post-op Pain Management:    Induction: Intravenous  Airway Management Planned: Natural Airway  Additional Equipment:   Intra-op Plan:   Post-operative Plan:   Informed Consent: I have reviewed the patients History and Physical, chart, labs and discussed the procedure including the risks, benefits and alternatives for the proposed anesthesia with the patient or authorized representative who has indicated his/her understanding and acceptance.   Dental advisory given  Plan Discussed with: CRNA  Anesthesia Plan Comments:         Anesthesia Quick Evaluation

## 2016-05-31 NOTE — Anesthesia Postprocedure Evaluation (Signed)
Anesthesia Post Note  Patient: Toni [name]  Procedure(s) Performed: Procedure(s) (LRB): COLONOSCOPY WITH PROPOFOL (N/A)  Patient location during evaluation: PACU Anesthesia Type: MAC Level of consciousness: awake and alert Pain management: pain level controlled Vital Signs Assessment: post-procedure vital signs reviewed and stable Respiratory status: spontaneous breathing, nonlabored ventilation, respiratory function stable and patient connected to nasal cannula oxygen Cardiovascular status: stable and blood pressure returned to baseline Anesthetic complications: no    Last Vitals:  Filed Vitals:   05/31/16 0820 05/31/16 0830  BP: 179/74 166/67  Pulse: 91 89  Temp:    Resp: 21 19    Last Pain:  Filed Vitals:   05/31/16 0831  PainSc: 4                  Elaysia Devargas J

## 2016-05-31 NOTE — H&P (Addendum)
Toni [name] is an 82 y.o. female.   Chief Complaint: Colorectal cancer screening. HPI: 36 year white female here for a colonoscopy after she was found to have a positive Cologaurd stool DNA test. She has had some looser stools with lower abdominal cramping. See office notes for details.   Past Medical History  Diagnosis Date  . PUD (peptic ulcer disease) 1987    with h pylori  . Hepatic cyst   . Hypertension   . Hyperlipemia   . Colonic polyp   . Diverticulosis   . Helicobacter pylori gastritis 1987  . C. difficile colitis 2016  . Acute kidney injury ([place])   . Headache     sinus   Past Surgical History  Procedure Laterality Date  . Abdominal hysterectomy      BSO ; endometriosis; ? Appendectomy incidentally  . Cholecystectomy  2008  . Rotator cuff repair      right  . Colonoscopy  2003 & 2012    polyps; Dr Collene Mares  . Tonsillectomy and adenoidectomy     Family History  Problem Relation Age of Onset  . Asthma Brother   . Hypertension Father   . Heart attack Father 50  . Leukemia Mother   . Stomach cancer Maternal Aunt   . Breast cancer Sister   . Thyroid disease Sister   . Stroke Neg Hx   . Diabetes Neg Hx    Social History:  reports that she quit smoking about 42 years ago. She has never used smokeless tobacco. She reports that she drinks alcohol. She reports that she does not use illicit drugs.  Allergies: No Active Allergies  Medications Prior to Admission  Medication Sig Dispense Refill  . amLODipine (NORVASC) 5 MG tablet Take 1 tablet (5 mg total) by mouth daily. 90 tablet 3  . aspirin 81 MG tablet Take 81 mg by mouth daily.    . Coenzyme Q10 300 MG CAPS Take 1 capsule by mouth daily.     . cyanocobalamin 500 MCG tablet Take 500 mcg by mouth daily.    . cycloSPORINE (RESTASIS) 0.05 % ophthalmic emulsion Place 1 drop into both eyes 2 (two) times daily.     Marland Kitchen donepezil (ARICEPT) 10 MG tablet Take 1 tablet daily 90 tablet 3  . hydrALAZINE (APRESOLINE) 25 MG  tablet Take 1 tablet (25 mg total) by mouth 2 (two) times daily. 180 tablet 0  . hydrochlorothiazide (HYDRODIURIL) 25 MG tablet Take 1 tablet (25 mg total) by mouth daily. 90 tablet 3  . LORazepam (ATIVAN) 0.5 MG tablet Take 1 tablet (0.5 mg total) by mouth at bedtime as needed. ---Pt should establish with new PCP for further refills 30 tablet 0  . losartan (COZAAR) 100 MG tablet Take 100 mg by mouth daily.  3  . metoprolol tartrate (LOPRESSOR) 25 MG tablet TAKE 1 TABLET (25 MG TOTAL) BY MOUTH 2 (TWO) TIMES DAILY. 180 tablet 3  . omeprazole (PRILOSEC) 20 MG capsule Take 1 capsule by mouth daily as needed (heartburn).     . Probiotic Product (PROBIOTIC DAILY PO) Take 1 tablet by mouth daily.     . sertraline (ZOLOFT) 100 MG tablet Take 1 tablet (100 mg total) by mouth daily. 90 tablet 3  . sulfamethoxazole-trimethoprim (BACTRIM DS,SEPTRA DS) 800-160 MG tablet Take 1 tablet by mouth 2 (two) times daily. 14 tablet 0  . saccharomyces boulardii (FLORASTOR) 250 MG capsule Take 1 capsule (250 mg total) by mouth 2 (two) times daily. (Patient not taking:  Reported on 05/21/2016) 60 capsule 1   Review of Systems  Constitutional: Positive for malaise/fatigue. Negative for fever, chills, weight loss and diaphoresis.  Eyes: Negative.   Respiratory: Negative.   Cardiovascular: Negative.   Gastrointestinal: Positive for heartburn and abdominal pain. Negative for nausea, vomiting, constipation, blood in stool and melena.  Genitourinary: Negative.   Skin: Negative.   Neurological: Positive for weakness and headaches.  Endo/Heme/Allergies: Negative.   Psychiatric/Behavioral: Positive for depression and memory loss. Negative for suicidal ideas, hallucinations and substance abuse. The patient is nervous/anxious. The patient does not have insomnia.    Blood pressure 116/63, pulse 79, temperature 97.7 F (36.5 C), temperature source Oral, resp. rate 20, height 5\' 7"  (1.702 m), weight 77.111 kg (170 lb), SpO2 96  %. Physical Exam  Constitutional: She is oriented to person, place, and time. She appears well-developed and well-nourished.  HENT:  Head: Normocephalic and atraumatic.  Eyes: Conjunctivae and EOM are normal. Pupils are equal, round, and reactive to light.  Neck: Normal range of motion. Neck supple.  Cardiovascular: Normal rate and regular rhythm.   Respiratory: Effort normal and breath sounds normal.  GI: Soft. Bowel sounds are normal.  Neurological: She is alert and oriented to person, place, and time.  Skin: Skin is warm and dry.  Psychiatric: She has a normal mood and affect.    Assessment/Plan CRC screening-positive Cologaurd stool DNA test. Proceed with a colonoscopy at this time.  Loris Winrow, MD 05/31/2016, 7:19 AM

## 2016-06-01 NOTE — Telephone Encounter (Signed)
A user error has taken place: Not sure why encounter is still open since may. Closing encounter...Johny Chess

## 2016-06-03 IMAGING — CR DG CHEST 2V
1 series · 1 of 1 positions shown · non-contrast
Comparison: 03/28/2014

CLINICAL DATA: Renal failure

EXAM:
CHEST  2 VIEW

[view not recorded]
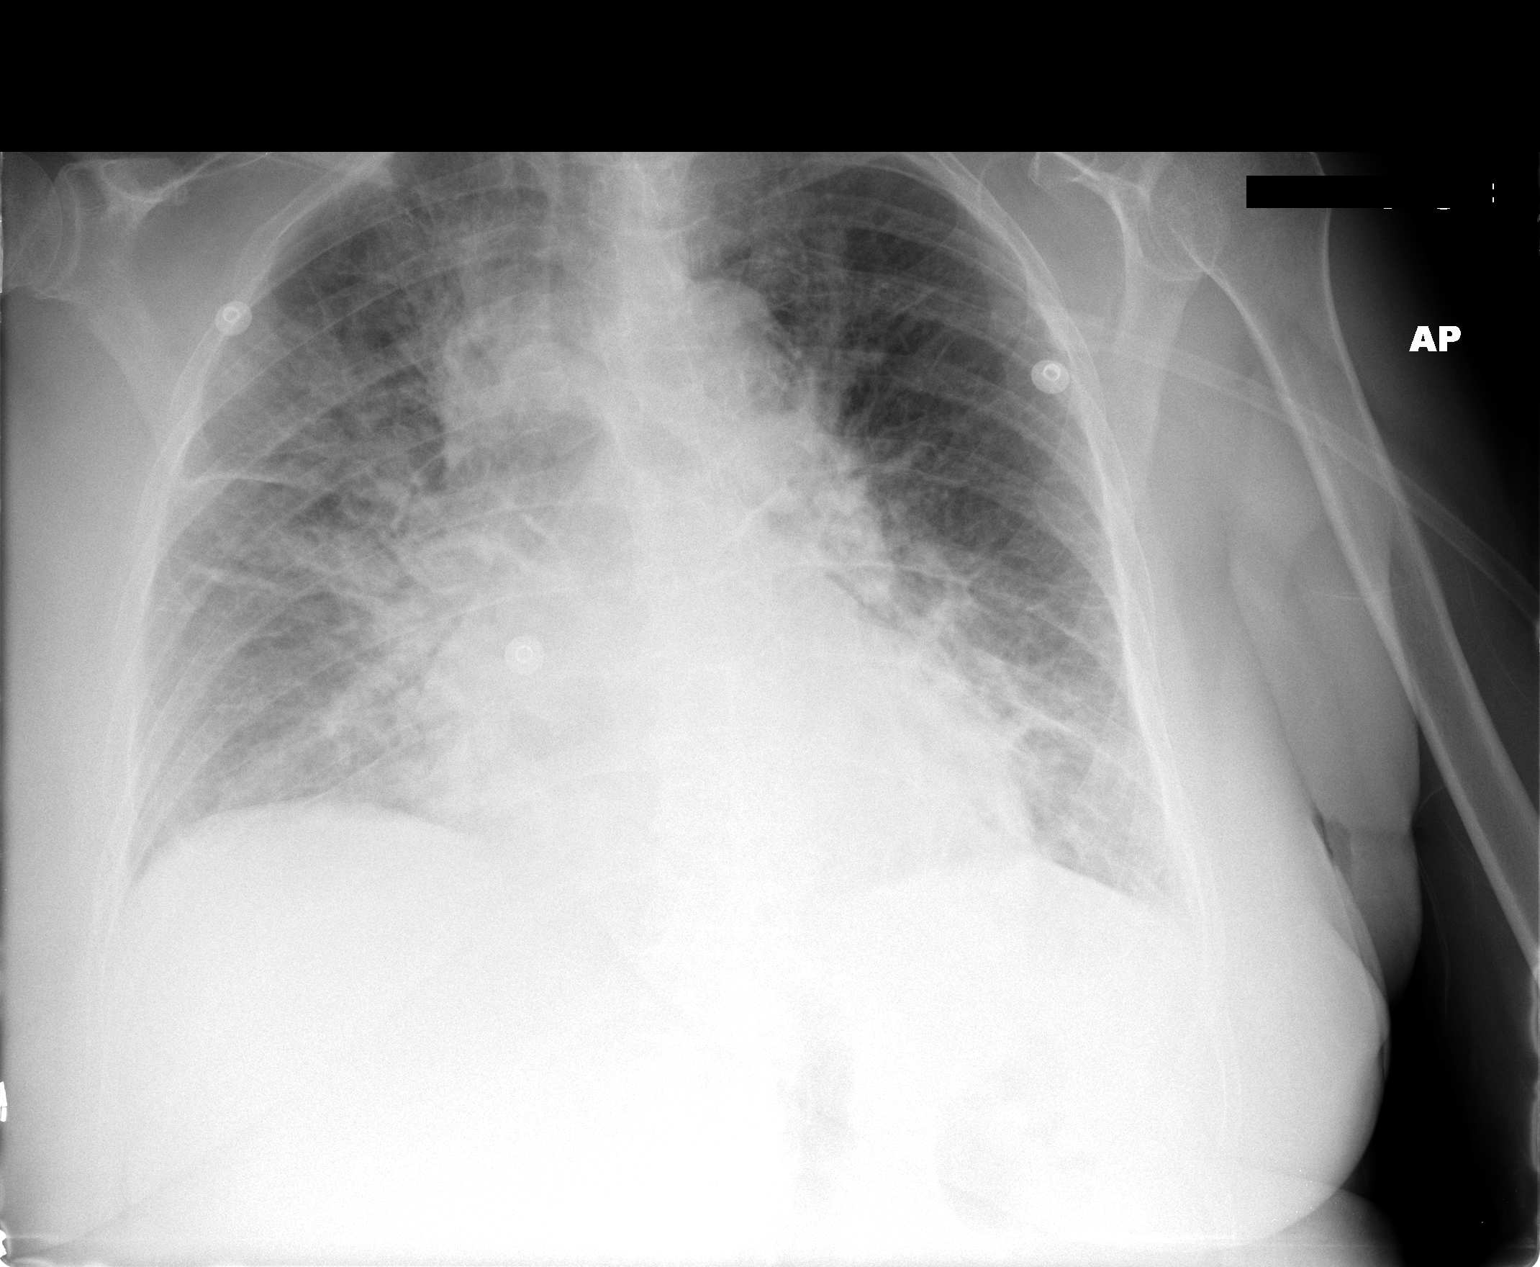

[1 of 1 positions shown; findings below may reference images not displayed]

FINDINGS: Hilar regions and right peritracheal region exhibits increased soft
tissue density. Heterogeneous opacities in the left mid and lower
lung zone. Heterogeneous opacities throughout the right lung,
extending from the hilar regions. No pneumothorax. Bilateral small
pleural effusions. Mild cardiomegaly.
IMPRESSION: The above findings most likely represent volume overload and CHF.
Followup studies in the short-term are recommended to ensure
resolution of these findings as there is prominence of the
mediastinal and hilar soft tissues.

## 2016-06-04 ENCOUNTER — Other Ambulatory Visit: Payer: Self-pay | Admitting: Gastroenterology

## 2016-06-04 DIAGNOSIS — K5730 Diverticulosis of large intestine without perforation or abscess without bleeding: Secondary | ICD-10-CM

## 2016-06-06 IMAGING — MR MR HEAD W/O CM
8 of 10 series · 39 of 48 positions shown · non-contrast
Comparison: CT head 03/03/2015

CLINICAL DATA: Stroke

EXAM:
MRI HEAD WITHOUT CONTRAST
TECHNIQUE: Multiplanar, multiecho pulse sequences of the brain and surrounding
structures were obtained without intravenous contrast.

[Series 3: T1 · sagittal · 5.0mm · 0.47mm/px · 2 of 24 slices shown]
[im 1/24]
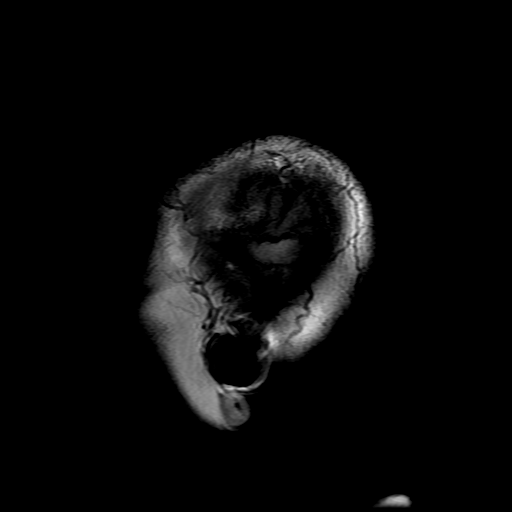
[im 24/24]
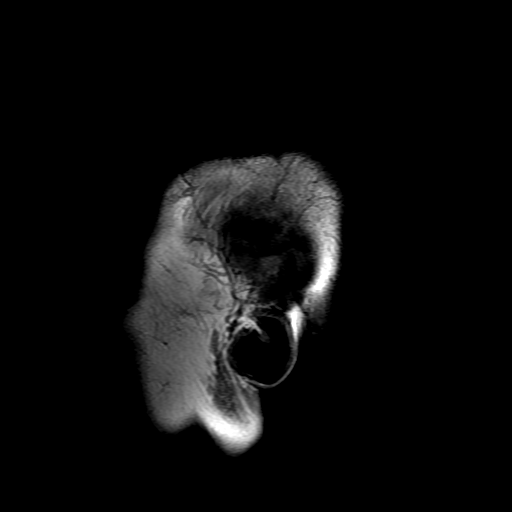

[Series 4: DWI · axial · 3.0mm · 1.09mm/px · z∈[-16,+138]mm · 11 of 106 slices shown (1 of 4)]
[im 1/106]
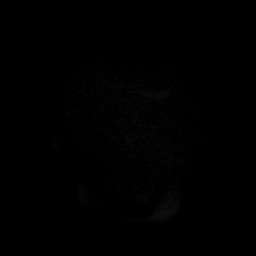
[im 11/106]
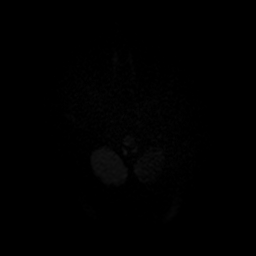
[im 22/106]
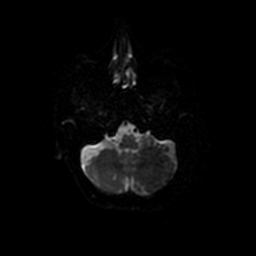
[im 32/106]
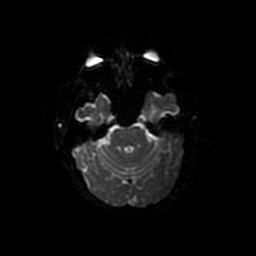
[im 43/106]
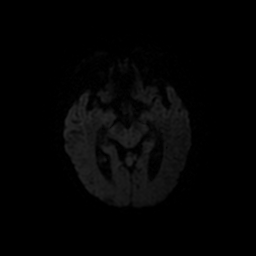
[im 53/106]
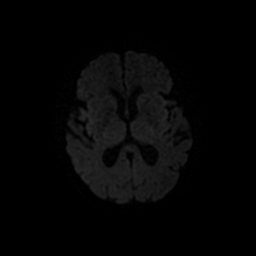
[im 64/106]
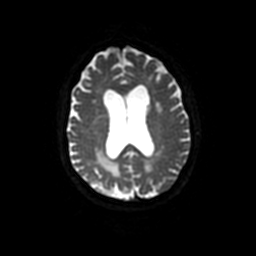
[im 74/106]
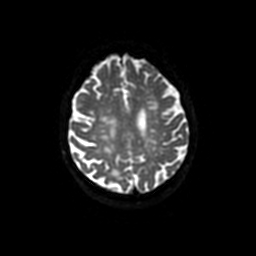
[im 85/106]
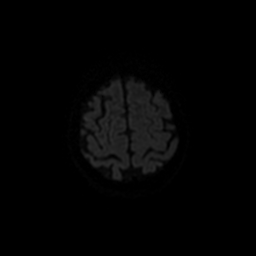
[im 95/106]
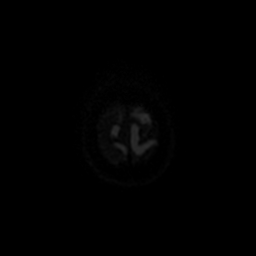
[im 106/106]
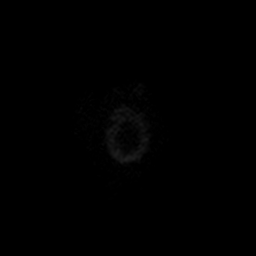

[Series 5: DWI · coronal · 5.0mm · 1.09mm/px · 7 of 70 slices shown (2 of 4)]
[im 1/70]
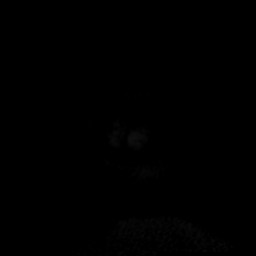
[im 12/70]
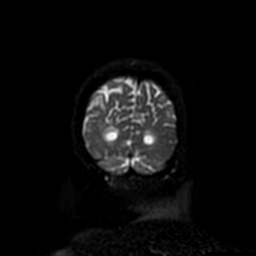
[im 24/70]
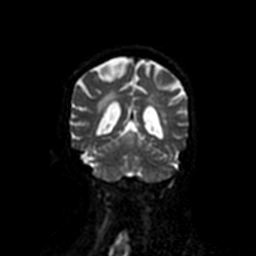
[im 35/70]
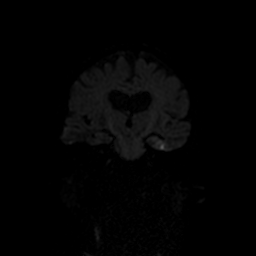
[im 47/70]
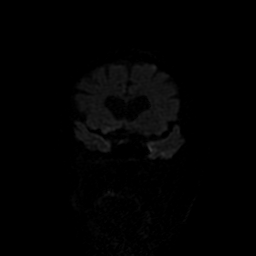
[im 58/70]
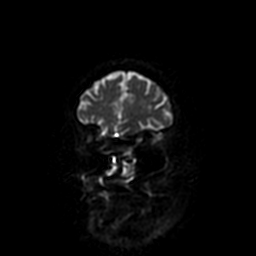
[im 70/70]
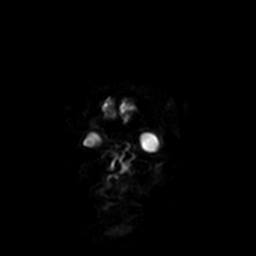

[Series 6: T2 · axial · 5.0mm · 0.43mm/px · z∈[-28,+133]mm · 3 of 26 slices shown (1 of 2)]
[im 1/26]
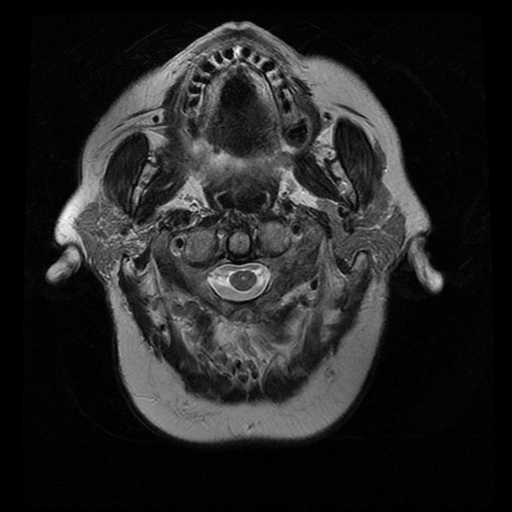
[im 13/26]
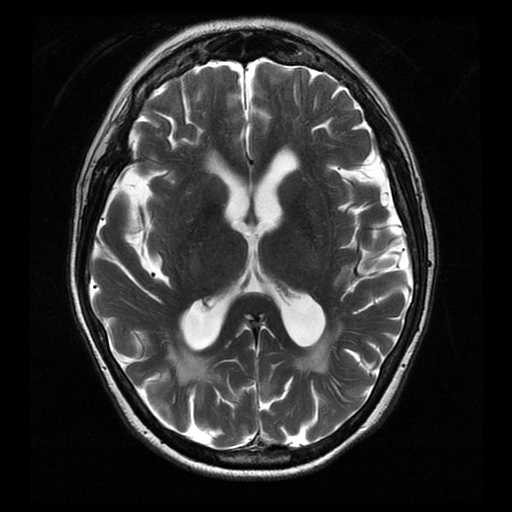
[im 26/26]
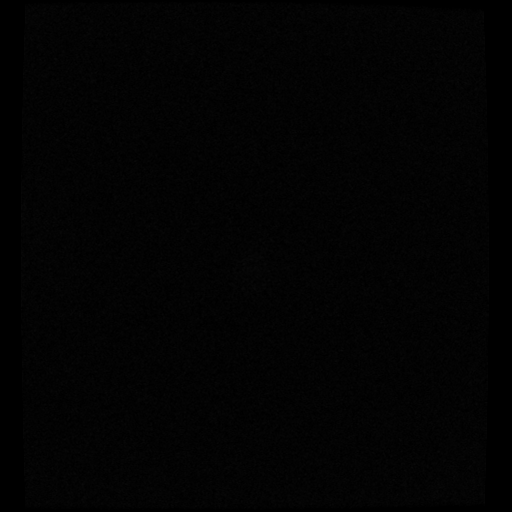

[Series 7: FLAIR · axial · 5.0mm · 0.43mm/px · z∈[-34,+139]mm · 3 of 26 slices shown]
[im 1/26]
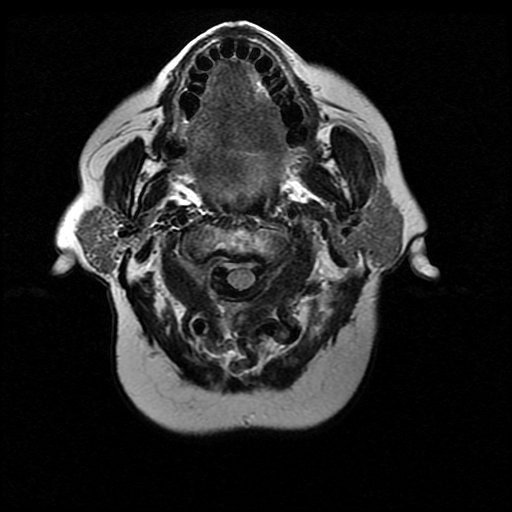
[im 13/26]
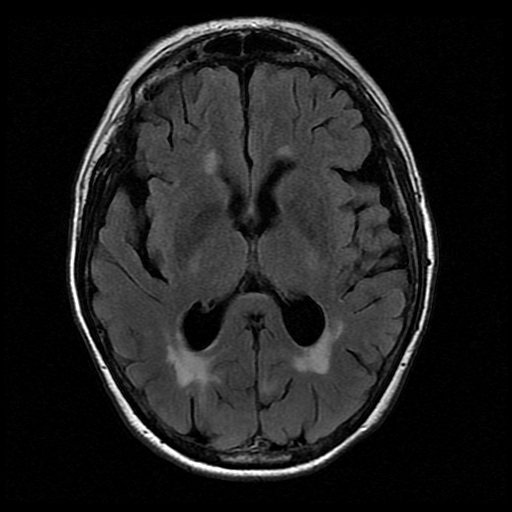
[im 26/26]
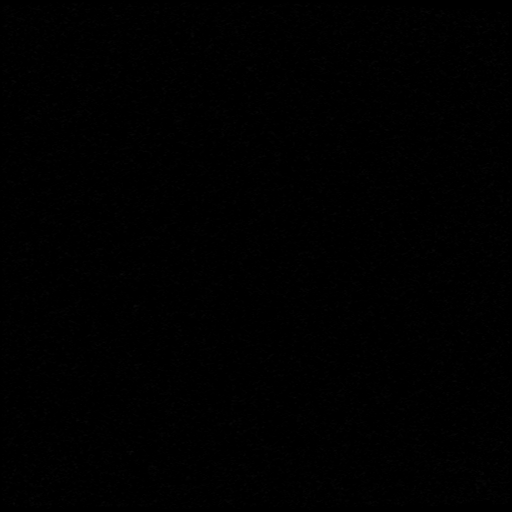

[Series 10: T2 · coronal · 5.0mm · 0.45mm/px · 3 of 27 slices shown (2 of 2)]
[im 1/27]
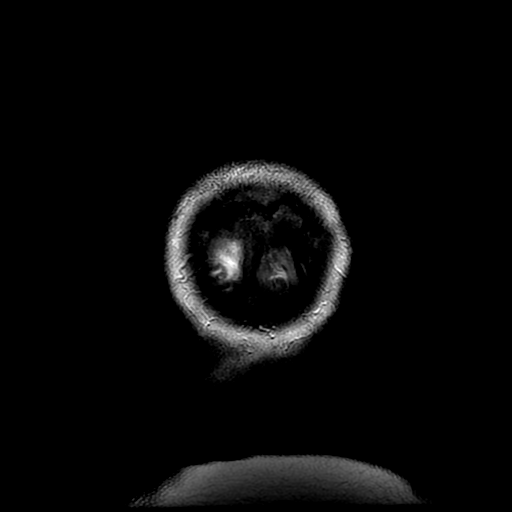
[im 14/27]
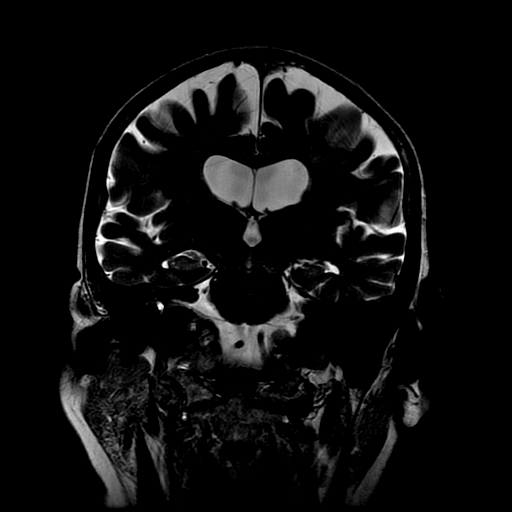
[im 27/27]
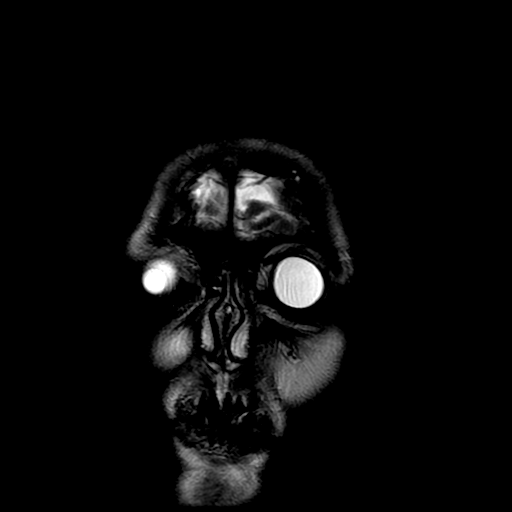

[Series 400: DWI · axial · 3.0mm · 1.09mm/px · z∈[-16,+138]mm · 6 of 53 slices shown (3 of 4)]
[im 1/53]
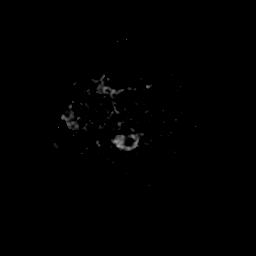
[im 11/53]
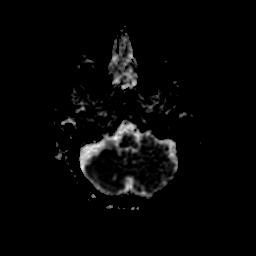
[im 21/53]
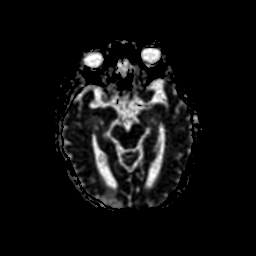
[im 32/53]
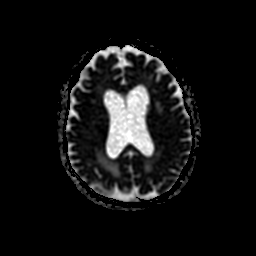
[im 42/53]
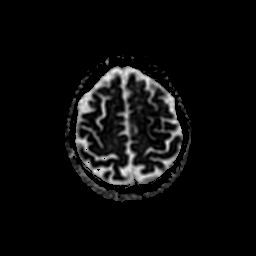
[im 53/53]
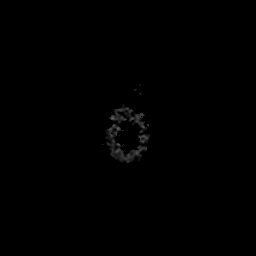

[Series 500: DWI · coronal · 5.0mm · 1.09mm/px · 4 of 35 slices shown (4 of 4)]
[im 1/35]
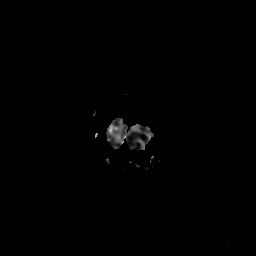
[im 12/35]
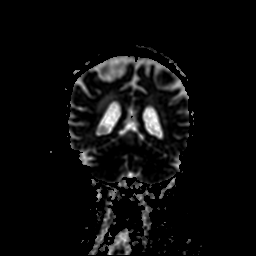
[im 23/35]
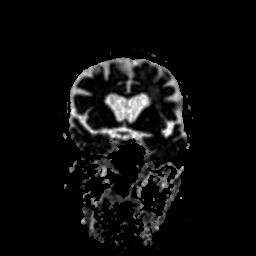
[im 35/35]
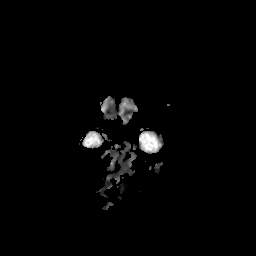

[39 of 48 positions shown; findings below may reference images not displayed]

FINDINGS: Negative for acute infarct.

Generalized atrophy. Ventricular enlargement consistent with atrophy

Chronic microvascular ischemic changes throughout the white matter
bilaterally. Chronic infarcts in the occipital lobes bilaterally
right greater than left. Small chronic infarct right parietal
cortex. Small chronic infarcts right and left cerebellum. Brainstem
intact. Basal ganglia intact.

Negative for hemorrhage or fluid collection

Negative for mass or edema.  No shift of the midline structures.
IMPRESSION: Atrophy and chronic ischemia.  No acute infarct.

## 2016-06-11 ENCOUNTER — Telehealth: Payer: Self-pay | Admitting: Internal Medicine

## 2016-06-11 ENCOUNTER — Other Ambulatory Visit (INDEPENDENT_AMBULATORY_CARE_PROVIDER_SITE_OTHER): Payer: Medicare Other

## 2016-06-11 DIAGNOSIS — N390 Urinary tract infection, site not specified: Secondary | ICD-10-CM | POA: Diagnosis not present

## 2016-06-11 DIAGNOSIS — R300 Dysuria: Secondary | ICD-10-CM

## 2016-06-11 LAB — URINALYSIS, ROUTINE W REFLEX MICROSCOPIC
Bilirubin Urine: NEGATIVE
KETONES UR: NEGATIVE
NITRITE: POSITIVE — AB
PH: 7.5 (ref 5.0–8.0)
SPECIFIC GRAVITY, URINE: 1.01 (ref 1.000–1.030)
URINE GLUCOSE: NEGATIVE
Urobilinogen, UA: 0.2 — AB (ref 0.0–1.0)

## 2016-06-11 NOTE — Telephone Encounter (Signed)
Pt called in and said that she possibly has a UTI and wants to know if she can bring a sample in today?

## 2016-06-11 NOTE — Telephone Encounter (Signed)
Okayed with Terri Piedra, NP. Orders entered. Pt notified.

## 2016-06-12 ENCOUNTER — Other Ambulatory Visit: Payer: Self-pay | Admitting: Family

## 2016-06-12 ENCOUNTER — Encounter: Payer: Self-pay | Admitting: Family

## 2016-06-12 MED ORDER — AMOXICILLIN-POT CLAVULANATE 875-125 MG PO TABS: 1.0000 | ORAL_TABLET | Freq: Two times a day (BID) | ORAL | Status: DC

## 2016-06-14 LAB — URINE CULTURE

## 2016-06-18 ENCOUNTER — Ambulatory Visit
Admission: RE | Admit: 2016-06-18 | Discharge: 2016-06-18 | Disposition: A | Discharge status: Home / Self Care | Payer: Medicare Other | Source: Ambulatory Visit | Attending: Gastroenterology | Admitting: Gastroenterology

## 2016-06-18 DIAGNOSIS — K5730 Diverticulosis of large intestine without perforation or abscess without bleeding: Secondary | ICD-10-CM

## 2016-06-19 ENCOUNTER — Other Ambulatory Visit: Payer: Self-pay | Admitting: Gastroenterology

## 2016-06-19 ENCOUNTER — Encounter: Payer: Self-pay | Admitting: Family

## 2016-06-19 ENCOUNTER — Other Ambulatory Visit: Payer: Self-pay | Admitting: Internal Medicine

## 2016-06-19 DIAGNOSIS — K5730 Diverticulosis of large intestine without perforation or abscess without bleeding: Secondary | ICD-10-CM

## 2016-06-22 ENCOUNTER — Other Ambulatory Visit (INDEPENDENT_AMBULATORY_CARE_PROVIDER_SITE_OTHER): Payer: Medicare Other

## 2016-06-22 ENCOUNTER — Telehealth: Payer: Self-pay | Admitting: Emergency Medicine

## 2016-06-22 DIAGNOSIS — N390 Urinary tract infection, site not specified: Secondary | ICD-10-CM

## 2016-06-22 LAB — URINALYSIS, ROUTINE W REFLEX MICROSCOPIC
Nitrite: NEGATIVE
PH: 5.5 (ref 5.0–8.0)
SPECIFIC GRAVITY, URINE: 1.025 (ref 1.000–1.030)
Total Protein, Urine: 100 — AB
URINE GLUCOSE: NEGATIVE
UROBILINOGEN UA: 1 (ref 0.0–1.0)

## 2016-06-22 NOTE — Telephone Encounter (Signed)
Pts husband called and is going to bring a urine sample over for his wife. Per Dr Quay Burow to follow up on UTI. Please put a order in for this thanks.

## 2016-06-22 NOTE — Telephone Encounter (Signed)
Please advise 

## 2016-06-22 NOTE — Telephone Encounter (Signed)
Ok - UA, Ucx

## 2016-06-22 NOTE — Telephone Encounter (Signed)
Orders entered

## 2016-06-24 LAB — URINE CULTURE: Colony Count: 75000

## 2016-06-26 ENCOUNTER — Telehealth: Payer: Self-pay | Admitting: Emergency Medicine

## 2016-06-26 ENCOUNTER — Other Ambulatory Visit: Payer: Self-pay | Admitting: Internal Medicine

## 2016-06-26 DIAGNOSIS — N390 Urinary tract infection, site not specified: Secondary | ICD-10-CM

## 2016-06-26 MED ORDER — NITROFURANTOIN MONOHYD MACRO 100 MG PO CAPS: 100.0000 mg | ORAL_CAPSULE | Freq: Two times a day (BID) | ORAL | Status: DC

## 2016-06-26 NOTE — Telephone Encounter (Signed)
Pts husband called and states pharmacy told him they do not have a prescription for his wife. They are waiting for a prescription for a UTI. Can you please follow up on this. Thanks.

## 2016-06-26 NOTE — Telephone Encounter (Signed)
Spoke with pt and husband.

## 2016-06-28 ENCOUNTER — Ambulatory Visit
Admission: RE | Admit: 2016-06-28 | Discharge: 2016-06-28 | Disposition: A | Discharge status: Home / Self Care | Payer: Medicare Other | Source: Ambulatory Visit | Attending: Gastroenterology | Admitting: Gastroenterology

## 2016-06-28 DIAGNOSIS — K5730 Diverticulosis of large intestine without perforation or abscess without bleeding: Secondary | ICD-10-CM

## 2016-07-02 DIAGNOSIS — S22080A Wedge compression fracture of T11-T12 vertebra, initial encounter for closed fracture: Secondary | ICD-10-CM | POA: Diagnosis not present

## 2016-07-05 ENCOUNTER — Encounter: Payer: Self-pay | Admitting: Internal Medicine

## 2016-07-06 DIAGNOSIS — M545 Low back pain: Secondary | ICD-10-CM | POA: Diagnosis not present

## 2016-07-11 DIAGNOSIS — S22080A Wedge compression fracture of T11-T12 vertebra, initial encounter for closed fracture: Secondary | ICD-10-CM | POA: Diagnosis not present

## 2016-07-22 ENCOUNTER — Other Ambulatory Visit: Payer: Self-pay | Admitting: Internal Medicine

## 2016-07-25 DIAGNOSIS — S22080D Wedge compression fracture of T11-T12 vertebra, subsequent encounter for fracture with routine healing: Secondary | ICD-10-CM | POA: Diagnosis not present

## 2016-07-27 DIAGNOSIS — K5732 Diverticulitis of large intestine without perforation or abscess without bleeding: Secondary | ICD-10-CM | POA: Diagnosis not present

## 2016-07-27 DIAGNOSIS — N321 Vesicointestinal fistula: Secondary | ICD-10-CM | POA: Diagnosis not present

## 2016-07-29 ENCOUNTER — Other Ambulatory Visit: Payer: Self-pay | Admitting: Internal Medicine

## 2016-07-29 DIAGNOSIS — I10 Essential (primary) hypertension: Secondary | ICD-10-CM

## 2016-07-30 ENCOUNTER — Other Ambulatory Visit: Payer: Self-pay | Admitting: General Surgery

## 2016-07-30 DIAGNOSIS — K5732 Diverticulitis of large intestine without perforation or abscess without bleeding: Secondary | ICD-10-CM

## 2016-08-02 ENCOUNTER — Other Ambulatory Visit: Payer: Medicare Other

## 2016-08-07 ENCOUNTER — Ambulatory Visit
Admission: RE | Admit: 2016-08-07 | Discharge: 2016-08-07 | Disposition: A | Discharge status: Home / Self Care | Payer: Medicare Other | Source: Ambulatory Visit | Attending: General Surgery | Admitting: General Surgery

## 2016-08-07 DIAGNOSIS — K5732 Diverticulitis of large intestine without perforation or abscess without bleeding: Secondary | ICD-10-CM

## 2016-08-07 MED ORDER — IOPAMIDOL (ISOVUE-300) INJECTION 61%
100.0000 mL | Freq: Once | INTRAVENOUS | Status: AC | PRN
  Administered 2016-08-07: 100 mL via INTRAVENOUS

## 2016-08-22 DIAGNOSIS — M546 Pain in thoracic spine: Secondary | ICD-10-CM | POA: Diagnosis not present

## 2016-08-24 DIAGNOSIS — N321 Vesicointestinal fistula: Secondary | ICD-10-CM | POA: Diagnosis not present

## 2016-08-27 ENCOUNTER — Encounter: Payer: Self-pay | Admitting: Internal Medicine

## 2016-08-27 DIAGNOSIS — N321 Vesicointestinal fistula: Secondary | ICD-10-CM | POA: Insufficient documentation

## 2016-09-03 DIAGNOSIS — N321 Vesicointestinal fistula: Secondary | ICD-10-CM | POA: Diagnosis not present

## 2016-09-03 DIAGNOSIS — K5732 Diverticulitis of large intestine without perforation or abscess without bleeding: Secondary | ICD-10-CM | POA: Diagnosis not present

## 2016-09-12 ENCOUNTER — Encounter: Payer: Self-pay | Admitting: Cardiology

## 2016-09-14 ENCOUNTER — Encounter: Payer: Self-pay | Admitting: Cardiology

## 2016-09-14 ENCOUNTER — Ambulatory Visit (INDEPENDENT_AMBULATORY_CARE_PROVIDER_SITE_OTHER): Payer: Medicare Other | Admitting: Cardiology

## 2016-09-14 VITALS — BP 110/60 | HR 76 | Ht 68.0 in | Wt 158.1 lb

## 2016-09-14 DIAGNOSIS — R0609 Other forms of dyspnea: Secondary | ICD-10-CM | POA: Diagnosis not present

## 2016-09-14 DIAGNOSIS — Z7689 Persons encountering health services in other specified circumstances: Secondary | ICD-10-CM

## 2016-09-14 DIAGNOSIS — R0600 Dyspnea, unspecified: Secondary | ICD-10-CM

## 2016-09-14 DIAGNOSIS — Z01818 Encounter for other preprocedural examination: Secondary | ICD-10-CM

## 2016-09-14 DIAGNOSIS — I519 Heart disease, unspecified: Secondary | ICD-10-CM | POA: Diagnosis not present

## 2016-09-14 DIAGNOSIS — I5189 Other ill-defined heart diseases: Secondary | ICD-10-CM

## 2016-09-14 DIAGNOSIS — Z23 Encounter for immunization: Secondary | ICD-10-CM | POA: Diagnosis not present

## 2016-09-14 NOTE — Patient Instructions (Signed)
Your physician recommends that you continue on your current medications as directed. Please refer to the Current Medication list given to you today.    Your physician recommends that you schedule a follow-up appointment in:  AS NEEDED 

## 2016-09-14 NOTE — Progress Notes (Signed)
09/14/2016 Jolayne Haines   1934-03-14  [id number]  Primary Physician Binnie Rail, MD Primary Cardiologist: New (Dr. Meda Coffee, [place])   Reason for Visit/CC: Pre-operative Assessment for Surgical Clearance  HPI:   Mrs. Nunamaker, is a 82 y/o female, new to our practice. She has been referred by Dr. Zella Richer of [facility] for Pre-operative assessment for cardiac clearance, prior to undergoing surgery. She has a Colovesical Fistula and may ultimately need a colostomy.   She had an echo 03/2015 as part of a dyspnea evaluation. This showed normal LVEF at 60-65%. Normal wall motion. Grade 1DD and mild MR.  No issues with her aorta/ aortic valve.   Her cardiac risk factors include HTN which is treated with medications. BP is well controlled today at 110/60.  She is a former smoker but quit in the 1980s. No h/o DM nor HLD. No significant family h/o CAD or SCD. She denies any prior history of cardiac issues. She has early stages of dementia. Her husband manages her meds.   She is fairly active. She walks for exercise, 10 minute walks 3x day. No exertional CP nor dyspnea. She is amble to climb a flight of stairs w/o exertional symptoms. No significant dyspnea over the last year. No syncope/ near syncope.   EKG shows NSR with rate of 61 bpm and no ischemia. Physical exam is benign.     Current Meds  Medication Sig  . amLODipine (NORVASC) 5 MG tablet TAKE 1 TABLET BY MOUTH EVERY DAY  . aspirin 81 MG tablet Take 81 mg by mouth daily.  . Coenzyme Q10 300 MG CAPS Take 1 capsule by mouth daily.   . cyanocobalamin 500 MCG tablet Take 500 mcg by mouth daily.  Marland Kitchen donepezil (ARICEPT) 10 MG tablet Take 1 tablet daily  . hydrALAZINE (APRESOLINE) 25 MG tablet Take 1 tablet (25 mg total) by mouth 2 (two) times daily.  . hydrochlorothiazide (HYDRODIURIL) 25 MG tablet TAKE 1 TABLET BY MOUTH EVERY DAY  . losartan (COZAAR) 100 MG tablet Take 100 mg by mouth daily.  . metoprolol tartrate  (LOPRESSOR) 25 MG tablet TAKE 1 TABLET (25 MG TOTAL) BY MOUTH 2 (TWO) TIMES DAILY.  . Probiotic Product (PROBIOTIC DAILY PO) Take 1 tablet by mouth daily.   . sertraline (ZOLOFT) 50 MG tablet Take 50 mg by mouth daily.  . [DISCONTINUED] sertraline (ZOLOFT) 100 MG tablet Take 1 tablet (100 mg total) by mouth daily.   No Known Allergies Past Medical History:  Diagnosis Date  . Acute kidney injury ([place])   . C. difficile colitis 2016  . Colonic polyp   . Diverticulosis   . Headache    sinus  . Helicobacter pylori gastritis 1987  . Hepatic cyst   . Hyperlipemia   . Hypertension   . PUD (peptic ulcer disease) 1987   with h pylori   Family History  Problem Relation Age of Onset  . Asthma Brother   . Hypertension Father   . Heart attack Father 34  . Leukemia Mother   . Stomach cancer Maternal Aunt   . Breast cancer Sister   . Thyroid disease Sister   . Stroke Neg Hx   . Diabetes Neg Hx    Past Surgical History:  Procedure Laterality Date  . ABDOMINAL HYSTERECTOMY     BSO ; endometriosis; ? Appendectomy incidentally  . CHOLECYSTECTOMY  2008  . COLONOSCOPY  2003 & 2012   polyps; Dr Collene Mares  . endoscopy gastritis  2008  .  FLEXIBLE SIGMOIDOSCOPY N/A 05/31/2016   Procedure: FLEXIBLE SIGMOIDOSCOPY;  Surgeon: Juanita Craver, MD;  Location: [facility];  Service: Endoscopy;  Laterality: N/A;  . ROTATOR CUFF REPAIR     right  . TONSILLECTOMY AND ADENOIDECTOMY     Social History   Social History  . Marital status: Married    Spouse name: N/A  . Number of children: 2  . Years of education: N/A   Occupational History  . Not on file.   Social History Main Topics  . Smoking status: Former Smoker    Quit date: 12/10/1973  . Smokeless tobacco: Never Used  . Alcohol use 0.0 oz/week     Comment:  socially  . Drug use: No  . Sexual activity: Not on file   Other Topics Concern  . Not on file   Social History Narrative  . No narrative on file     Review of Systems: General:  negative for chills, fever, night sweats or weight changes.  Cardiovascular: negative for chest pain, dyspnea on exertion, edema, orthopnea, palpitations, paroxysmal nocturnal dyspnea or shortness of breath Dermatological: negative for rash Respiratory: negative for cough or wheezing Urologic: negative for hematuria Abdominal: negative for nausea, vomiting, diarrhea, bright red blood per rectum, melena, or hematemesis Neurologic: negative for visual changes, syncope, or dizziness All other systems reviewed and are otherwise negative except as noted above.   Physical Exam:  Blood pressure 110/60, pulse 76, height 5\' 8"  (1.727 m), weight 158 lb 1.9 oz (71.7 kg), SpO2 97 %.  General appearance: alert, cooperative and no distress Neck: no carotid bruit and no JVD Heart: regular rate and rhythm, S1, S2 normal, no murmur, click, rub or gallop Extremities: extremities normal, atraumatic, no cyanosis or edema Pulses: 2+ and symmetric Skin: Skin color, texture, turgor normal. No rashes or lesions Neurologic: Grossly normal  EKG NSR no ischemia.   ASSESSMENT AND PLAN:   1. Cardiac Risk Assessment: Patient has no prior cardiac history. Her only cardiac risk factor includes hypertension which is well-controlled with medications. No history of diabetes, hyperlipidemia nor family history of CAD or sudden cardiac death. She is a former smoker but quit in the 1980s. She had a 2-D echocardiogram in April 2016 that was a normal study. Left ventricular ejection fraction was normal with EF of 60-65% with normal wall motion. Grade 1 diastolic dysfunction was noted however no significant valvular abnormalities. EKG today demonstrates normal sinus rhythm without ischemia. Her physical exam is unremarkable. Cardiac exam is notable for regular rate and rhythm. No murmurs nor cardiac bruits. She denies any anginal symptomotology. She is able to walk up a flight of stairs without exertional chest pain or dyspnea. She  has been seen by myself as well as Dr. Meda Coffee, DOD. The patient is felt to be  low risk for cardiac complications for this noncardiac surgery. She has been cleared from a cardiac standpoint to proceed with surgery without any additional cardiac evaluation. She may follow up in our office as needed. We will fax clearance letter to [facility].    Lyda Jester PA-C 09/14/2016 10:47 AM   The patient was seen, examined and discussed with Brittainy M. Rosita Fire, PA-C and I agree with the above.   This is a pleasant younger appearing female who was referred to [place] for preoperative cardiovascular assessment prior to a colovesical Fistula and possibly colostomy. The patient stated that she stays fairly active and is able to walk short distances and is able to perform activities of  daily living without significant shortness of breath or chest pain. She denies any orthopnea. No dyspnea. She has no lower extremity edema. She hasn't had any palpitation or syncope. Her EKG shows normal sinus rhythm and nonspecific ST-T wave abnormalities. Physical exam is not suggestive of CHF or severe valvular abnormality. She had an echo 03/2015 as part of a dyspnea evaluation. This showed normal LVEF at 60-65%. Normal wall motion. Grade 1DD and mild MR.   no significant valvular abnormalities.   There is currently no contraindication from cardiac stent point for this patient to undergo abdominal surgery. She is considered to be a low risk for intermediate risk surgery. Please call us with any questions. Her blood pressure is well controlled.   Ena Dawley 09/14/2016

## 2016-09-19 DIAGNOSIS — M546 Pain in thoracic spine: Secondary | ICD-10-CM | POA: Diagnosis not present

## 2016-09-24 ENCOUNTER — Telehealth: Payer: Self-pay | Admitting: Cardiology

## 2016-09-24 NOTE — Telephone Encounter (Signed)
Made pt and Spouse (on DPR) aware that this clearance was faxed to Maudie Mercury, Surgical Coordinator at [facility], today at [phone].  Informed the pts Spouse that Maudie Mercury at [facility], is aware of incoming fax, advised by Dr Meda Coffee and Ellen Henri PA-C.  Spouse verbalized understanding and gracious for all the assistance provided.

## 2016-09-24 NOTE — Telephone Encounter (Signed)
New message      Calling to get status on clearance for surgery paperwork.  Dr Zella Richer has not received clearance.  Please call pt

## 2016-09-26 ENCOUNTER — Ambulatory Visit: Payer: Self-pay | Admitting: General Surgery

## 2016-09-26 NOTE — H&P (Signed)
Toni [name] 09/03/2016 9:20 AM Location: [facility] Patient #: [id number] DOB: Jun 12, 1934 Married / Language: [name] / Race: White Female   History of Present Illness Odis Hollingshead MD; 09/03/2016 11:20 AM) The patient is a 82 year old female.  Note:She is here with her husband following her CT scan (results have been discussed with them) and her recent visit with the urologist (she does not remember his name). She is asymptomatic. She does not remember a lot about our discussion at her last visit. At that time, she was not interested in having surgery. Now she says she is interested in having the surgery. She feels well and is not having any significant pain or fever.  Allergies (April Staton, CMA; 09/03/2016 9:20 AM) Trandolapril *ANTIHYPERTENSIVES*  Medication History (April Staton, CMA; 09/03/2016 9:20 AM) Vitamin B12 (100MCG Tablet, Oral) Active. Donepezil HCl (10MG  Tablet, Oral) Active. Losartan Potassium (100MG  Tablet, Oral) Active. Sertraline HCl (50MG  Tablet, Oral) Active. HydrAZINE Sulfate Active. Metoprolol Tartrate (25MG  Tablet, Oral) Active. HydroCHLOROthiazide (25MG  Tablet, Oral) Active. Aspirin (81MG  Tablet, Oral) Active. AmLODIPine Besylate (5MG  Tablet, Oral) Active. Coenzyme Q10-Vitamin E (300-300MG -UNIT Wafer, Oral) Active. Probiotic Pearls (Oral) Active. Restasis (0.05% Emulsion, Ophthalmic) Active. Medications Reconciled  Vitals (April Staton CMA; 09/03/2016 9:21 AM) 09/03/2016 9:20 AM Weight: 157.5 lb Height: 67in Height was reported by patient. Body Surface Area: 1.83 m Body Mass Index: 24.67 kg/m  Temp.: 98.70F(Oral)  Pulse: 75 (Regular)  P.OX: 98% (Room air) BP: 150/80 (Sitting, Left Arm, Standard)       Physical Exam Odis Hollingshead MD; 09/03/2016 11:20 AM) The physical exam findings are as follows: Note:General-elderly female in no acute distress.  Eyes-no  icterus.  Cardiovascular-regular rate and rhythm.  Lungs-clear to auscultation.  Abdomen-soft, no tenderness or palpable mass.    Assessment & Plan Odis Hollingshead MD; 09/03/2016 11:20 AM) SIGMOID DIVERTICULITIS (K57.32) Current Plans Started Flagyl 500MG , 2 (two) Tablet SEE NOTE, #6, 09/03/2016, No Refill. Local Order: Take at 2pm, 3pm, and 10pm the day prior to your colon operation Pt Education - [facility] Free Text Education/Instructions: discussed with patient and provided information. 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 She remembers very little about her previous office visit. Her husband remembers it very well. She saw the urologist who stated that she wanted to have surgery, he would be glad to assist. We had a long discussion about the surgery including the possibility of colostomy. We also discussed that given her other health problems, risks of complications would be increased somewhat. I have explained the procedure and risks of colon resection. Risks include but are not limited to bleeding, infection, wound problems, anesthesia, anastomotic leak, need for colostomy, need for reoperative surgery, injury to intraabominal organs (such as intestine, spleen, kidney, bladder, ureter, etc.), ileus, irregular bowel habits.  Plan: I have asked her and her husband to think about it and call us in one week with her decision given at her last visit she did not want to have any surgery and now she is having second thoughts about that. If they decide to go ahead with the operation, we'll need a preoperative cardiology assessment. We'll give them preoperative bowel preparation instructions today.  Addendum:  She underwent her cardiac risk assessment and is not at increased risk.  She would like to proceed with the operation so we will start the scheduling process.

## 2016-09-28 ENCOUNTER — Ambulatory Visit (INDEPENDENT_AMBULATORY_CARE_PROVIDER_SITE_OTHER): Payer: Medicare Other | Admitting: Neurology

## 2016-09-28 ENCOUNTER — Encounter: Payer: Self-pay | Admitting: Neurology

## 2016-09-28 VITALS — BP 116/78 | HR 68 | Ht 68.0 in | Wt 158.6 lb

## 2016-09-28 DIAGNOSIS — F0390 Unspecified dementia without behavioral disturbance: Secondary | ICD-10-CM | POA: Diagnosis not present

## 2016-09-28 DIAGNOSIS — F03A0 Unspecified dementia, mild, without behavioral disturbance, psychotic disturbance, mood disturbance, and anxiety: Secondary | ICD-10-CM

## 2016-09-28 MED ORDER — DONEPEZIL HCL 10 MG PO TABS: ORAL_TABLET | ORAL | 3 refills | Status: DC

## 2016-09-28 NOTE — Progress Notes (Signed)
NEUROLOGY FOLLOW UP OFFICE NOTE  Toni [name] [id number]  HISTORY OF PRESENT ILLNESS: I had the pleasure of seeing Toni [name] in follow-up in the neurology clinic on 09/28/2016. The patient was last seen 6 months ago for worsening memory. MMSE in April 2017 was 23/30 (25/30 in October 2016. She is again accompanied by her husband who helps supplement the history today. They report her memory is fine, she has good and bad days, lately more good days. Some days she forgets a lot, and tells him it is her husband's fault. Her husband now administers her medications. She misplaces things at home, but became upset when her husband reported this, denying she does it. She feels "sharper than I have been." She is tolerating Aricept 10mg  daily without side effects. She fell 3 months ago and broke her ?tailbone, no surgery done, she has occasional pain and wears a brace. She is scheduled for abdominal surgery soon. She denies any headaches, dizziness, diplopia, dysarthria/dysphagia, focal numbness/tingling/weakness.   HPI: This is a pleasant 82 yo ambidextrous left-hand dominant woman with a history of hypertension, hyperlipidemia, presenting for evaluation of memory loss with abnormal head CT. When asked about her memory, Toni [name] reports her short-term memory is "not good at all." Long-term memory is good, she can remember details about her childhood well. She started noticing changes in the past year, but worse in the past 4-5 months. She would forget conversations from 10 minutes ago. She forgot to pay bills in the past 2-3 months. She left the stove on twice around 3-4 months ago. She denies getting lost driving. She denies any problems multitasking, very seldom word-finding difficulties. Her son has noticed that when he calls her, she would repeat the same story at the end of the call, or have the same conversation 10 minutes later. He also noticed changes in the past 1-2 years, worse in the  past 3-4 months. Her daughter feels symptoms started a little longer than 2 years ago, she forgot a conversation that someone was in jail in December 2015. She was staying at her daughter's house and forgot what they had talked about on their to-do list. They also have noticed she is very anxious. Her husband reports she had taken something out of the freezer one time, he came out the next day to see some things were not put back inside. He has noticed that she is more argumentative.   She reports 4 episodes where a shade would come up her right eye. This would last for a few minutes, last episode was in the fall of last year. No associated headache or focal numbness/tingling/weakness. She states she "always has headaches," indicating these are sinus headaches. She had seen Dr. Jacelyn Grip in 2013 and was diagnosed with cervicogenic headaches. She has headaches around three times a week, with right frontal throbbing pain, relieved with over the counter pain medication. There is no associated nausea/vomiting/photo/phonophobia. She has been diagnosed with migraines where she has a round circle in her right eye occurring around 3-4 times a month. She denies any dizziness, diplopia, blurred vision, neck/back pain, focal numbness/tingling/weakness. She has chronic constipations. She has occasional hand tremors. No anosmia. She denies any family history of memory problems. No history of head injuries. She drinks 1 to 1-1/2 glasses of wine at night.   I personally reviewed head CT without contrast done 03/03/15. There were several hypodensities in the bilateral hemispheres, right greater than left This was read as multifocal posterior circulation infarction  with the largest area in the right occiptial pole and smaller area in th e left occipital cortex. Two small areas of cortical and subcortical infarct in the high right parietal lobe, small vessel infarct in the upper right cerebellum. These could be subacute or chronic.  Brain atrophy with mild frontal predominance seen.  MRI brain without contrast which did not show any acute changes, there was generalized atrophy with ventricular enlargement, chronic infarcts in the bilateral occipital lobes, right greater than left, small chronic infarct in the right parietal cortex, chronic microvascular disease.   PAST MEDICAL HISTORY: Past Medical History:  Diagnosis Date  . Acute kidney injury ([place])   . C. difficile colitis 2016  . Colonic polyp   . Diverticulosis   . Headache    sinus  . Helicobacter pylori gastritis 1987  . Hepatic cyst   . Hyperlipemia   . Hypertension   . PUD (peptic ulcer disease) 1987   with h pylori    MEDICATIONS: Current Outpatient Prescriptions on File Prior to Visit  Medication Sig Dispense Refill  . amLODipine (NORVASC) 5 MG tablet TAKE 1 TABLET BY MOUTH EVERY DAY 90 tablet 3  . aspirin 81 MG tablet Take 81 mg by mouth daily.    . Coenzyme Q10 300 MG CAPS Take 1 capsule by mouth daily.     . cyanocobalamin 500 MCG tablet Take 500 mcg by mouth daily.    Marland Kitchen donepezil (ARICEPT) 10 MG tablet Take 1 tablet daily 90 tablet 3  . hydrALAZINE (APRESOLINE) 25 MG tablet Take 1 tablet (25 mg total) by mouth 2 (two) times daily. 180 tablet 0  . hydrochlorothiazide (HYDRODIURIL) 25 MG tablet TAKE 1 TABLET BY MOUTH EVERY DAY 90 tablet 2  . losartan (COZAAR) 100 MG tablet Take 100 mg by mouth daily.  3  . metoprolol tartrate (LOPRESSOR) 25 MG tablet TAKE 1 TABLET (25 MG TOTAL) BY MOUTH 2 (TWO) TIMES DAILY. 180 tablet 2  . Probiotic Product (PROBIOTIC DAILY PO) Take 1 tablet by mouth daily.     . sertraline (ZOLOFT) 50 MG tablet Take 50 mg by mouth daily.     No current facility-administered medications on file prior to visit.     ALLERGIES: No Known Allergies  FAMILY HISTORY: Family History  Problem Relation Age of Onset  . Asthma Brother   . Hypertension Father   . Heart attack Father 79  . Leukemia Mother   . Stomach cancer  Maternal Aunt   . Breast cancer Sister   . Thyroid disease Sister   . Stroke Neg Hx   . Diabetes Neg Hx     SOCIAL HISTORY: Social History   Social History  . Marital status: Married    Spouse name: N/A  . Number of children: 2  . Years of education: N/A   Occupational History  . Not on file.   Social History Main Topics  . Smoking status: Former Smoker    Quit date: 12/10/1973  . Smokeless tobacco: Never Used  . Alcohol use 0.0 oz/week     Comment:  socially  . Drug use: No  . Sexual activity: Not on file   Other Topics Concern  . Not on file   Social History Narrative  . No narrative on file    REVIEW OF SYSTEMS: Constitutional: No fevers, chills, or sweats, no generalized fatigue, change in appetite Eyes: No visual changes, double vision, eye pain Ear, nose and throat: No hearing loss, ear pain, nasal congestion, sore  throat Cardiovascular: No chest pain, palpitations Respiratory:  No shortness of breath at rest or with exertion, wheezes GastrointestinaI: No nausea, vomiting, diarrhea, abdominal pain, fecal incontinence Genitourinary:  No dysuria, urinary retention or frequency Musculoskeletal:  + neck pain, back pain Integumentary: No rash, pruritus, skin lesions Neurological: as above Psychiatric: No depression, insomnia, anxiety Endocrine: No palpitations, fatigue, diaphoresis, mood swings, change in appetite, change in weight, increased thirst Hematologic/Lymphatic:  No anemia, purpura, petechiae. Allergic/Immunologic: no itchy/runny eyes, nasal congestion, recent allergic reactions, rashes  PHYSICAL EXAM: Vitals:   09/28/16 1130  BP: 116/78  Pulse: 68   General: No acute distress, would occasionally become upset with husband when he speaks of her symptoms Head:  Normocephalic/atraumatic Neck: supple, no paraspinal tenderness, full range of motion Heart:  Regular rate and rhythm Lungs:  Clear to auscultation bilaterally Back: No paraspinal  tenderness Skin/Extremities: No rash, no edema Neurological Exam: alert and oriented to person, place, and month/year/season. No aphasia or dysarthria. Fund of knowledge is appropriate.  Remote memory intact.  Attention and concentration are normal.    Able to name objects and repeat phrases. Clock drawing test 4/5 MMSE - Mini Mental State Exam 09/28/2016 03/22/2016 09/21/2015  Orientation to time 2 3 2   Orientation to Place 5 4 5   Registration 3 3 3   Attention/ Calculation 4 4 5   Recall 2 0 1  Language- name 2 objects 2 2 2   Language- repeat 1 1 1   Language- follow 3 step command 3 3 3   Language- read & follow direction 1 1 1   Write a sentence 1 1 1   Copy design 0 1 1  Total score 24 23 25    Cranial nerves: Pupils equal, round, reactive to light. Extraocular movements intact with no nystagmus. Visual fields full. Facial sensation intact. No facial asymmetry. Tongue, uvula, palate midline.  Motor: Bulk and tone normal, muscle strength 5/5 throughout with no pronator drift.  Sensation to light touch intact.  No extinction to double simultaneous stimulation.  Deep tendon reflexes +1 throughout, toes downgoing.  Finger to nose testing intact.  Gait narrow-based and steady, mild difficulty with tandem walk but able.  Romberg negative.  IMPRESSION: This is a pleasant 82 yo LH woman with vascular risk factors including hypertension, hyperlipidemia, with mild dementia. MMSE today 24/30 (23/30 in April 2017, 25/30 in October 2016). Continue Aricept 10mg  daily. We again discussed mood, as she is noted to become intermittently irritated with her husband, she will monitor and follow-up with her PCP. We again discussed control of vascular risk factors, the importance of physical exercise and brain stimulation exercises for brain health. She will follow-up in 6 months or earlier if needed.  Thank you for allowing me to participate in her care.  Please do not hesitate to call for any questions or  concerns.  The duration of this appointment visit was 25 minutes of face-to-face time with the patient.  Greater than 50% of this time was spent in counseling, explanation of diagnosis, planning of further management, and coordination of care.   Ellouise Newer, M.D.   CC: Dr. Quay Burow

## 2016-09-28 NOTE — Patient Instructions (Signed)
1. Continue Donepezil 10mg  daily 2. Control of blood pressure, cholesterol, as well as physical exercise and brain stimulation exercises are important for brain health 3. Wishing you well with the surgery! 4. Follow-up in 6 months, call for any changes

## 2016-10-03 ENCOUNTER — Other Ambulatory Visit: Payer: Self-pay | Admitting: Urology

## 2016-10-15 NOTE — Patient Instructions (Addendum)
JOURI BARTHOLOMEW  10/15/2016   Your procedure is scheduled on: 10/19/2016    Report to [facility] Main  Entrance take [place]  elevators to 3rd floor to  [place] at   [place] AM.  Call this number if you have problems the morning of surgery [phone]   Remember: ONLY 1 PERSON MAY GO WITH YOU TO SHORT STAY TO GET  READY MORNING OF [place].  Do not eat food or drink liquids :After Midnight.             Follow Bowel Prep Instructions per MD.               Drink plenty of clear liquids on day of Bowel prep.       Take these medicines the morning of surgery with A SIP OF WATER: Amloidpine ( NOrvasc), Restasis eye drops, Hydralazine, ( Apresoline), Metoprolol ( Lopressor), Zoloft                                 You may not have any metal on your body including hair pins and              piercings  Do not wear jewelry, make-up, lotions, powders or perfumes, deodorant             Do not wear nail polish.  Do not shave  48 hours prior to surgery.                 Do not bring valuables to the hospital. [place].  Contacts, dentures or bridgework may not be worn into surgery.  Leave suitcase in the car. After surgery it may be brought to your room.         Special Instructions: coughing and deep breathing exercises, leg exercises               Please read over the following fact sheets you were given: _____________________________________________________________________             [facility] - Preparing for Surgery Before surgery, you can play an important role.  Because skin is not sterile, your skin needs to be as free of germs as possible.  You can reduce the number of germs on your skin by washing with CHG (chlorahexidine gluconate) soap before surgery.  CHG is an antiseptic cleaner which kills germs and bonds with the skin to continue killing germs even after washing. Please DO NOT use if you  have an allergy to CHG or antibacterial soaps.  If your skin becomes reddened/irritated stop using the CHG and inform your nurse when you arrive at Short Stay. Do not shave (including legs and underarms) for at least 48 hours prior to the first CHG shower.  You may shave your face/neck. Please follow these instructions carefully:  1.  Shower with CHG Soap the night before surgery and the  morning of Surgery.  2.  If you choose to wash your hair, wash your hair first as usual with your  normal  shampoo.  3.  After you shampoo, rinse your hair and body thoroughly to remove the  shampoo.  4.  Use CHG as you would any other liquid soap.  You can apply chg directly  to the skin and wash                       Gently with a scrungie or clean washcloth.  5.  Apply the CHG Soap to your body ONLY FROM THE NECK DOWN.   Do not use on face/ open                           Wound or open sores. Avoid contact with eyes, ears mouth and genitals (private parts).                       Wash face,  Genitals (private parts) with your normal soap.             6.  Wash thoroughly, paying special attention to the area where your surgery  will be performed.  7.  Thoroughly rinse your body with warm water from the neck down.  8.  DO NOT shower/wash with your normal soap after using and rinsing off  the CHG Soap.                9.  Pat yourself dry with a clean towel.            10.  Wear clean pajamas.            11.  Place clean sheets on your bed the night of your first shower and do not  sleep with pets. Day of Surgery : Do not apply any lotions/deodorants the morning of surgery.  Please wear clean clothes to the hospital/surgery center.  FAILURE TO FOLLOW THESE INSTRUCTIONS MAY RESULT IN THE CANCELLATION OF YOUR SURGERY PATIENT SIGNATURE_________________________________  NURSE  SIGNATURE__________________________________  ________________________________________________________________________  WHAT IS A BLOOD TRANSFUSION? Blood Transfusion Information  A transfusion is the replacement of blood or some of its parts. Blood is made up of multiple cells which provide different functions.  Red blood cells carry oxygen and are used for blood loss replacement.  White blood cells fight against infection.  Platelets control bleeding.  Plasma helps clot blood.  Other blood products are available for specialized needs, such as hemophilia or other clotting disorders. BEFORE THE TRANSFUSION  Who gives blood for transfusions?   Healthy volunteers who are fully evaluated to make sure their blood is safe. This is blood bank blood. Transfusion therapy is the safest it has ever been in the practice of medicine. Before blood is taken from a donor, a complete history is taken to make sure that person has no history of diseases nor engages in risky social behavior (examples are intravenous drug use or sexual activity with multiple partners). The donor's travel history is screened to minimize risk of transmitting infections, such as malaria. The donated blood is tested for signs of infectious diseases, such as HIV and hepatitis. The blood is then tested to be sure it is compatible with you in order to minimize the chance of a transfusion reaction. If you or a relative donates blood, this is often done in anticipation of surgery and is not appropriate for emergency situations. It takes many days to process the donated blood. RISKS AND COMPLICATIONS Although transfusion therapy is very safe and saves many lives, the main dangers of transfusion include:   Getting an infectious disease.  Developing a transfusion reaction. This  is an allergic reaction to something in the blood you were given. Every precaution is taken to prevent this. The decision to have a blood transfusion has been  considered carefully by your caregiver before blood is given. Blood is not given unless the benefits outweigh the risks. AFTER THE TRANSFUSION  Right after receiving a blood transfusion, you will usually feel much better and more energetic. This is especially true if your red blood cells have gotten low (anemic). The transfusion raises the level of the red blood cells which carry oxygen, and this usually causes an energy increase.  The nurse administering the transfusion will monitor you carefully for complications. HOME CARE INSTRUCTIONS  No special instructions are needed after a transfusion. You may find your energy is better. Speak with your caregiver about any limitations on activity for underlying diseases you may have. SEEK MEDICAL CARE IF:   Your condition is not improving after your transfusion.  You develop redness or irritation at the intravenous (IV) site. SEEK IMMEDIATE MEDICAL CARE IF:  Any of the following symptoms occur over the next 12 hours:  Shaking chills.  You have a temperature by mouth above 102 F (38.9 C), not controlled by medicine.  Chest, back, or muscle pain.  People around you feel you are not acting correctly or are confused.  Shortness of breath or difficulty breathing.  Dizziness and fainting.  You get a rash or develop hives.  You have a decrease in urine output.  Your urine turns a dark color or changes to pink, red, or brown. Any of the following symptoms occur over the next 10 days:  You have a temperature by mouth above 102 F (38.9 C), not controlled by medicine.  Shortness of breath.  Weakness after normal activity.  The white part of the eye turns yellow (jaundice).  You have a decrease in the amount of urine or are urinating less often.  Your urine turns a dark color or changes to pink, red, or brown. Document Released: 11/23/2000 Document Revised: 02/18/2012 Document Reviewed: 07/12/2008 ExitCare Patient Information 2014  ExitCare, [place].  _______________________________________________________________________   CLEAR LIQUID DIET   Foods Allowed                                                                     Foods Excluded  Coffee and tea, regular and decaf                             liquids that you cannot  Plain Jell-O in any flavor                                             see through such as: Fruit ices (not with fruit pulp)                                     milk, soups, orange juice  Iced Popsicles  All solid food Carbonated beverages, regular and diet                                    Cranberry, grape and apple juices Sports drinks like Gatorade Lightly seasoned clear broth or consume(fat free) Sugar, honey syrup  Sample Menu Breakfast                                Lunch                                     Supper Cranberry juice                    Beef broth                            Chicken broth Jell-O                                     Grape juice                           Apple juice Coffee or tea                        Jell-O                                      Popsicle                                                Coffee or tea                        Coffee or tea  _____________________________________________________________________   COLON BOWEL PREP Please follow the instructions carefully. It is important to clean out your bowels & take the prescribed antibiotic pills to lower your chances of a wound infection or abscess.   FIVE DAYS PRIOR TO YOUR SURGERY Stop eating any nuts, popcorn, or fruit with seeds. Stop all fiber supplements such as Metamucil, Citrucel, etc.   Hold taking any blood thinning anticoagulation medication (ex: aspirin, warfarin/Coumadin, Plavix, Xarelto, Eliquis, Pradaxa, etc) as recommended by your medical/cardiology doctor  Obtain what you need at a pharmacy of your choice: -Filled out prescriptions for your oral  antibiotics (Neomycin & Metronidazole)  -A bottle of MiraLax / Glycolax (288g) - no prescription required  -A large bottle of Gatorade / Powerade (64oz)  -Dulcolax tablets (4 tabs) - no prescription required   DAY PRIOR TO SURGERY   7:00am Swallow 4 Dulcolax tablets with some water Drink plenty of clear liquids all day to avoid getting dehydrated (Water, juice, soda, coffee, tea, bouillon, jello, etc.)  10:00am Mix the bottle of MiraLax with the 64-oz bottle of Gatorade.  Drink the Gatorade mixture gradually over the next few hours (8oz glass every 15-30 minutes) until gone. You should  finish by 2pm.  2:00pm Take 2 Neomycin 500mg  tablets & 2 Metronidazole 500mg  tablets  3:00pm Take 2 Neomycin 500mg  tablets & 2 Metronidazole 500mg  tablets  Drink plenty of clear liquids all evening to avoid getting dehydrated  10:00pm Take 2 Neomycin 500mg  tablets & 2 Metronidazole 500mg  tablets  Do not eat or drink anything after bedtime (midnight) the night before your surgery.   MORNING OF SURGERY Remember to not to drink or eat anything that morning  Hold or take medications as recommended by the hospital staff at your Preoperative visit  If you have questions or concerns, please call [facility] [phone] during business hours to speak to the clinical staff for advice.

## 2016-10-16 ENCOUNTER — Encounter (HOSPITAL_COMMUNITY): Payer: Self-pay

## 2016-10-16 ENCOUNTER — Encounter (HOSPITAL_COMMUNITY)
Admission: RE | Admit: 2016-10-16 | Discharge: 2016-10-16 | Disposition: A | Discharge status: Home / Self Care | Payer: Medicare Other | Source: Ambulatory Visit | Attending: General Surgery | Admitting: General Surgery

## 2016-10-16 DIAGNOSIS — K5730 Diverticulosis of large intestine without perforation or abscess without bleeding: Secondary | ICD-10-CM | POA: Insufficient documentation

## 2016-10-16 DIAGNOSIS — Z01818 Encounter for other preprocedural examination: Secondary | ICD-10-CM | POA: Insufficient documentation

## 2016-10-16 DIAGNOSIS — N321 Vesicointestinal fistula: Secondary | ICD-10-CM | POA: Insufficient documentation

## 2016-10-16 HISTORY — DX: Gastro-esophageal reflux disease without esophagitis: K21.9

## 2016-10-16 HISTORY — DX: Depression, unspecified: F32.A

## 2016-10-16 HISTORY — DX: Pneumonia, unspecified organism: J18.9

## 2016-10-16 HISTORY — DX: Major depressive disorder, single episode, unspecified: F32.9

## 2016-10-16 LAB — CBC WITH DIFFERENTIAL/PLATELET
BASOS PCT: 0 %
Basophils Absolute: 0 10*3/uL (ref 0.0–0.1)
EOS ABS: 0.2 10*3/uL (ref 0.0–0.7)
Eosinophils Relative: 2 %
HCT: 40 % (ref 36.0–46.0)
HEMOGLOBIN: 13.6 g/dL (ref 12.0–15.0)
Lymphocytes Relative: 23 %
Lymphs Abs: 2.3 10*3/uL (ref 0.7–4.0)
MCH: 30.6 pg (ref 26.0–34.0)
MCHC: 34 g/dL (ref 30.0–36.0)
MCV: 90.1 fL (ref 78.0–100.0)
MONOS PCT: 7 %
Monocytes Absolute: 0.7 10*3/uL (ref 0.1–1.0)
NEUTROS PCT: 68 %
Neutro Abs: 7 10*3/uL (ref 1.7–7.7)
PLATELETS: 326 10*3/uL (ref 150–400)
RBC: 4.44 MIL/uL (ref 3.87–5.11)
RDW: 13.9 % (ref 11.5–15.5)
WBC: 10.3 10*3/uL (ref 4.0–10.5)

## 2016-10-16 LAB — ABO/RH: ABO/RH(D): A POS

## 2016-10-16 LAB — COMPREHENSIVE METABOLIC PANEL
ALBUMIN: 4.3 g/dL (ref 3.5–5.0)
ALK PHOS: 72 U/L (ref 38–126)
ALT: 14 U/L (ref 14–54)
ANION GAP: 8 (ref 5–15)
AST: 18 U/L (ref 15–41)
BUN: 27 mg/dL — ABNORMAL HIGH (ref 6–20)
CALCIUM: 9.5 mg/dL (ref 8.9–10.3)
CHLORIDE: 104 mmol/L (ref 101–111)
CO2: 27 mmol/L (ref 22–32)
Creatinine, Ser: 1.07 mg/dL — ABNORMAL HIGH (ref 0.44–1.00)
GFR calc Af Amer: 55 mL/min — ABNORMAL LOW (ref >60)
GFR calc non Af Amer: 48 mL/min — ABNORMAL LOW (ref >60)
GLUCOSE: 94 mg/dL (ref 65–99)
Potassium: 4.1 mmol/L (ref 3.5–5.1)
SODIUM: 139 mmol/L (ref 135–145)
Total Bilirubin: 0.7 mg/dL (ref 0.3–1.2)
Total Protein: 7.8 g/dL (ref 6.5–8.1)

## 2016-10-16 NOTE — Consult Note (Signed)
[place] Nurse ostomy consult note  [place] Nurse requested for preoperative stoma site marking by Dr. Brien Mates.  A LLQ Colostomy marking is requested. DOS is 10/19/16.  Discussed surgical procedure and stoma creation with patient and husband.  Explained role of the [facility] nurse team.  Answered patient and family questions. Both are hopeful that there is no need for an ostomy and if an ostomy is necessary, are comforted by the fact that it is likely that it would be temporary.  Examined patient sitting and standing in order to place the marking in the patient's visual field, away from any creases or abdominal contour issues and within the rectus muscle. Patient wears her undergarments and elastic-waisted slacks at her natural waist (umbilicus).  Marked for colostomy in the LLQ  6.0cm to the left of the umbilicus and [id number] below the umbilicus.  Patient's abdomen cleansed with CHG wipes at site markings, allowed to air dry prior to marking. Elta Gu[place]oupe is made with a surgical skin marking pen.  Covered mark with thin film transparent dressing to preserve mark until date of surgery, which is Friday, October 19, 2016.  Thanks for inviting me to see this nice patient and her husband preoperatively.  Please consult if a stoma is created intraoperatively.  Maudie Flakes, MSN, RN, [place], [place], [place]  Pager# [phone].

## 2016-10-16 NOTE — Progress Notes (Signed)
CMp results of 10/16/16 faxed via EPIC to Dr Zella Richer and Dr Pilar Jarvis.

## 2016-10-16 NOTE — Progress Notes (Signed)
Neurology note- LOV- 09/28/16- in epic  09/14/16- [place]- cardiology- clearance- - epic  EKG- epic  Echo-03/30/15- epic

## 2016-10-17 LAB — HEMOGLOBIN A1C
HEMOGLOBIN A1C: 5.3 % (ref 4.8–5.6)
Mean Plasma Glucose: 105 mg/dL

## 2016-10-19 ENCOUNTER — Encounter (HOSPITAL_COMMUNITY)
Admission: RE | Disposition: A | Discharge status: Skilled Nursing Facility | Payer: Self-pay | Source: Ambulatory Visit | Attending: General Surgery

## 2016-10-19 ENCOUNTER — Inpatient Hospital Stay (HOSPITAL_COMMUNITY): Payer: Medicare Other | Admitting: Anesthesiology

## 2016-10-19 ENCOUNTER — Inpatient Hospital Stay (HOSPITAL_COMMUNITY)
Admission: RE | Admit: 2016-10-19 | Discharge: 2016-10-26 | DRG: 329 | Disposition: A | Discharge status: Skilled Nursing Facility | Payer: Medicare Other | Source: Ambulatory Visit | Attending: General Surgery | Admitting: General Surgery

## 2016-10-19 ENCOUNTER — Encounter (HOSPITAL_COMMUNITY): Payer: Self-pay | Admitting: Anesthesiology

## 2016-10-19 DIAGNOSIS — K5720 Diverticulitis of large intestine with perforation and abscess without bleeding: Secondary | ICD-10-CM | POA: Diagnosis not present

## 2016-10-19 DIAGNOSIS — R279 Unspecified lack of coordination: Secondary | ICD-10-CM | POA: Diagnosis not present

## 2016-10-19 DIAGNOSIS — Z48815 Encounter for surgical aftercare following surgery on the digestive system: Secondary | ICD-10-CM | POA: Diagnosis not present

## 2016-10-19 DIAGNOSIS — I200 Unstable angina: Secondary | ICD-10-CM | POA: Diagnosis not present

## 2016-10-19 DIAGNOSIS — I10 Essential (primary) hypertension: Secondary | ICD-10-CM | POA: Diagnosis not present

## 2016-10-19 DIAGNOSIS — R2681 Unsteadiness on feet: Secondary | ICD-10-CM | POA: Diagnosis not present

## 2016-10-19 DIAGNOSIS — Z79899 Other long term (current) drug therapy: Secondary | ICD-10-CM | POA: Diagnosis not present

## 2016-10-19 DIAGNOSIS — D62 Acute posthemorrhagic anemia: Secondary | ICD-10-CM | POA: Diagnosis not present

## 2016-10-19 DIAGNOSIS — I129 Hypertensive chronic kidney disease with stage 1 through stage 4 chronic kidney disease, or unspecified chronic kidney disease: Secondary | ICD-10-CM | POA: Diagnosis not present

## 2016-10-19 DIAGNOSIS — K632 Fistula of intestine: Secondary | ICD-10-CM | POA: Diagnosis not present

## 2016-10-19 DIAGNOSIS — M6281 Muscle weakness (generalized): Secondary | ICD-10-CM | POA: Diagnosis not present

## 2016-10-19 DIAGNOSIS — N183 Chronic kidney disease, stage 3 (moderate): Secondary | ICD-10-CM | POA: Diagnosis not present

## 2016-10-19 DIAGNOSIS — R079 Chest pain, unspecified: Secondary | ICD-10-CM | POA: Diagnosis not present

## 2016-10-19 DIAGNOSIS — K5732 Diverticulitis of large intestine without perforation or abscess without bleeding: Secondary | ICD-10-CM | POA: Diagnosis not present

## 2016-10-19 DIAGNOSIS — Z87891 Personal history of nicotine dependence: Secondary | ICD-10-CM

## 2016-10-19 DIAGNOSIS — R531 Weakness: Secondary | ICD-10-CM | POA: Diagnosis not present

## 2016-10-19 DIAGNOSIS — K5730 Diverticulosis of large intestine without perforation or abscess without bleeding: Secondary | ICD-10-CM | POA: Diagnosis not present

## 2016-10-19 DIAGNOSIS — M25139 Fistula, unspecified wrist: Secondary | ICD-10-CM | POA: Diagnosis not present

## 2016-10-19 DIAGNOSIS — Z7982 Long term (current) use of aspirin: Secondary | ICD-10-CM

## 2016-10-19 DIAGNOSIS — Z8249 Family history of ischemic heart disease and other diseases of the circulatory system: Secondary | ICD-10-CM | POA: Diagnosis not present

## 2016-10-19 DIAGNOSIS — F0390 Unspecified dementia without behavioral disturbance: Secondary | ICD-10-CM | POA: Diagnosis not present

## 2016-10-19 DIAGNOSIS — N321 Vesicointestinal fistula: Secondary | ICD-10-CM | POA: Diagnosis not present

## 2016-10-19 DIAGNOSIS — Z9049 Acquired absence of other specified parts of digestive tract: Secondary | ICD-10-CM

## 2016-10-19 DIAGNOSIS — I214 Non-ST elevation (NSTEMI) myocardial infarction: Secondary | ICD-10-CM

## 2016-10-19 DIAGNOSIS — K219 Gastro-esophageal reflux disease without esophagitis: Secondary | ICD-10-CM | POA: Diagnosis present

## 2016-10-19 DIAGNOSIS — G3184 Mild cognitive impairment, so stated: Secondary | ICD-10-CM | POA: Diagnosis not present

## 2016-10-19 DIAGNOSIS — R41841 Cognitive communication deficit: Secondary | ICD-10-CM | POA: Diagnosis not present

## 2016-10-19 DIAGNOSIS — E785 Hyperlipidemia, unspecified: Secondary | ICD-10-CM | POA: Diagnosis not present

## 2016-10-19 DIAGNOSIS — E7800 Pure hypercholesterolemia, unspecified: Secondary | ICD-10-CM | POA: Diagnosis not present

## 2016-10-19 DIAGNOSIS — R51 Headache: Secondary | ICD-10-CM | POA: Diagnosis not present

## 2016-10-19 HISTORY — PX: LAPAROSCOPIC PARTIAL COLECTOMY: SHX5907

## 2016-10-19 HISTORY — PX: CYSTOSCOPY: SHX5120

## 2016-10-19 LAB — CBC
HEMATOCRIT: 37 % (ref 36.0–46.0)
Hemoglobin: 12.8 g/dL (ref 12.0–15.0)
MCH: 30 pg (ref 26.0–34.0)
MCHC: 34.6 g/dL (ref 30.0–36.0)
MCV: 86.7 fL (ref 78.0–100.0)
Platelets: 325 10*3/uL (ref 150–400)
RBC: 4.27 MIL/uL (ref 3.87–5.11)
RDW: 13.8 % (ref 11.5–15.5)
WBC: 9 10*3/uL (ref 4.0–10.5)

## 2016-10-19 LAB — TYPE AND SCREEN
ABO/RH(D): A POS
Antibody Screen: NEGATIVE

## 2016-10-19 LAB — CREATININE, SERUM
Creatinine, Ser: 1.12 mg/dL — ABNORMAL HIGH (ref 0.44–1.00)
GFR calc Af Amer: 52 mL/min — ABNORMAL LOW (ref >60)
GFR, EST NON AFRICAN AMERICAN: 45 mL/min — AB (ref >60)

## 2016-10-19 SURGERY — LAPAROSCOPIC PARTIAL COLECTOMY
Anesthesia: General | Site: Urethra

## 2016-10-19 MED ORDER — HYDRALAZINE HCL 25 MG PO TABS
25.0000 mg | ORAL_TABLET | Freq: Two times a day (BID) | ORAL | Status: DC
  Administered 2016-10-20 – 2016-10-22 (×4): 25 mg via ORAL
  Filled 2016-10-19 (×6): qty 1

## 2016-10-19 MED ORDER — HYDRALAZINE HCL 20 MG/ML IJ SOLN
INTRAMUSCULAR | Status: AC
  Filled 2016-10-19: qty 1

## 2016-10-19 MED ORDER — HYDROMORPHONE HCL 2 MG/ML IJ SOLN
INTRAMUSCULAR | Status: AC
  Filled 2016-10-19: qty 1

## 2016-10-19 MED ORDER — METOPROLOL TARTRATE 25 MG PO TABS
25.0000 mg | ORAL_TABLET | Freq: Two times a day (BID) | ORAL | Status: DC
  Administered 2016-10-20 – 2016-10-22 (×4): 25 mg via ORAL
  Filled 2016-10-19 (×6): qty 1

## 2016-10-19 MED ORDER — CEFOTETAN DISODIUM 2 G IJ SOLR
2.0000 g | Freq: Two times a day (BID) | INTRAMUSCULAR | Status: AC
  Administered 2016-10-19: 2 g via INTRAVENOUS
  Filled 2016-10-19: qty 2

## 2016-10-19 MED ORDER — 0.9 % SODIUM CHLORIDE (POUR BTL) OPTIME
TOPICAL | Status: DC | PRN
  Administered 2016-10-19: 2000 mL
  Administered 2016-10-19: 1000 mL

## 2016-10-19 MED ORDER — FAMOTIDINE IN NACL 20-0.9 MG/50ML-% IV SOLN
20.0000 mg | INTRAVENOUS | Status: DC
  Administered 2016-10-19 – 2016-10-23 (×5): 20 mg via INTRAVENOUS
  Filled 2016-10-19 (×5): qty 50

## 2016-10-19 MED ORDER — HYDROMORPHONE HCL 1 MG/ML IJ SOLN
0.2500 mg | INTRAMUSCULAR | Status: DC | PRN
  Administered 2016-10-19 (×2): 0.5 mg via INTRAVENOUS

## 2016-10-19 MED ORDER — ROCURONIUM BROMIDE 50 MG/5ML IV SOSY
PREFILLED_SYRINGE | INTRAVENOUS | Status: AC
  Filled 2016-10-19: qty 5

## 2016-10-19 MED ORDER — BUPIVACAINE HCL (PF) 0.5 % IJ SOLN
INTRAMUSCULAR | Status: AC
  Filled 2016-10-19: qty 30

## 2016-10-19 MED ORDER — ONDANSETRON HCL 4 MG PO TABS: 4.0000 mg | ORAL_TABLET | Freq: Four times a day (QID) | ORAL | Status: DC | PRN

## 2016-10-19 MED ORDER — DONEPEZIL HCL 5 MG PO TABS
10.0000 mg | ORAL_TABLET | Freq: Every day | ORAL | Status: DC
  Administered 2016-10-20 – 2016-10-25 (×6): 10 mg via ORAL
  Filled 2016-10-19 (×3): qty 1
  Filled 2016-10-19: qty 2
  Filled 2016-10-19 (×2): qty 1
  Filled 2016-10-19: qty 2

## 2016-10-19 MED ORDER — ENOXAPARIN SODIUM 40 MG/0.4ML ~~LOC~~ SOLN
40.0000 mg | SUBCUTANEOUS | Status: DC
  Administered 2016-10-20 – 2016-10-26 (×7): 40 mg via SUBCUTANEOUS
  Filled 2016-10-19 (×7): qty 0.4

## 2016-10-19 MED ORDER — FENTANYL CITRATE (PF) 100 MCG/2ML IJ SOLN
INTRAMUSCULAR | Status: DC | PRN
  Administered 2016-10-19: 100 ug via INTRAVENOUS
  Administered 2016-10-19: 50 ug via INTRAVENOUS
  Administered 2016-10-19: 100 ug via INTRAVENOUS
  Administered 2016-10-19: 50 ug via INTRAVENOUS

## 2016-10-19 MED ORDER — CHLORHEXIDINE GLUCONATE CLOTH 2 % EX PADS: 6.0000 | MEDICATED_PAD | Freq: Once | CUTANEOUS | Status: DC

## 2016-10-19 MED ORDER — SODIUM CHLORIDE 0.9 % IJ SOLN
INTRAMUSCULAR | Status: AC
  Filled 2016-10-19: qty 50

## 2016-10-19 MED ORDER — METHYLENE BLUE 0.5 % INJ SOLN
INTRAVENOUS | Status: AC
  Filled 2016-10-19: qty 10

## 2016-10-19 MED ORDER — HYDROMORPHONE HCL 1 MG/ML IJ SOLN
0.3000 mg | INTRAMUSCULAR | Status: DC | PRN
  Administered 2016-10-19 – 2016-10-20 (×5): 0.6 mg via INTRAVENOUS
  Filled 2016-10-19 (×6): qty 1

## 2016-10-19 MED ORDER — MIDAZOLAM HCL 2 MG/2ML IJ SOLN: 0.5000 mg | Freq: Once | INTRAMUSCULAR | Status: DC | PRN

## 2016-10-19 MED ORDER — LIDOCAINE 2% (20 MG/ML) 5 ML SYRINGE
INTRAMUSCULAR | Status: DC | PRN
  Administered 2016-10-19: 50 mg via INTRAVENOUS

## 2016-10-19 MED ORDER — EPHEDRINE SULFATE-NACL 50-0.9 MG/10ML-% IV SOSY
PREFILLED_SYRINGE | INTRAVENOUS | Status: DC | PRN
  Administered 2016-10-19 (×2): 5 mg via INTRAVENOUS

## 2016-10-19 MED ORDER — SUGAMMADEX SODIUM 200 MG/2ML IV SOLN
INTRAVENOUS | Status: DC | PRN
  Administered 2016-10-19: 150 mg via INTRAVENOUS

## 2016-10-19 MED ORDER — ROCURONIUM BROMIDE 10 MG/ML (PF) SYRINGE
PREFILLED_SYRINGE | INTRAVENOUS | Status: DC | PRN
  Administered 2016-10-19 (×6): 10 mg via INTRAVENOUS
  Administered 2016-10-19: 40 mg via INTRAVENOUS
  Administered 2016-10-19: 10 mg via INTRAVENOUS

## 2016-10-19 MED ORDER — FENTANYL CITRATE (PF) 100 MCG/2ML IJ SOLN
INTRAMUSCULAR | Status: AC
  Filled 2016-10-19: qty 2

## 2016-10-19 MED ORDER — KCL IN DEXTROSE-NACL 20-5-0.9 MEQ/L-%-% IV SOLN
INTRAVENOUS | Status: DC
  Administered 2016-10-19: 1 mL via INTRAVENOUS
  Administered 2016-10-20: 05:00:00 via INTRAVENOUS
  Administered 2016-10-20: 1000 mL via INTRAVENOUS
  Administered 2016-10-21 – 2016-10-24 (×7): via INTRAVENOUS
  Filled 2016-10-19 (×10): qty 1000

## 2016-10-19 MED ORDER — PROPOFOL 10 MG/ML IV BOLUS
INTRAVENOUS | Status: AC
  Filled 2016-10-19: qty 20

## 2016-10-19 MED ORDER — HYDROMORPHONE HCL 1 MG/ML IJ SOLN
INTRAMUSCULAR | Status: AC
  Filled 2016-10-19: qty 1

## 2016-10-19 MED ORDER — STERILE WATER FOR IRRIGATION IR SOLN
Status: DC | PRN
  Administered 2016-10-19: 500 mL via INTRAVESICAL

## 2016-10-19 MED ORDER — ALVIMOPAN 12 MG PO CAPS
12.0000 mg | ORAL_CAPSULE | Freq: Once | ORAL | Status: AC
  Administered 2016-10-19: 12 mg via ORAL
  Filled 2016-10-19: qty 1

## 2016-10-19 MED ORDER — PROPOFOL 10 MG/ML IV BOLUS
INTRAVENOUS | Status: DC | PRN
  Administered 2016-10-19: 110 mg via INTRAVENOUS

## 2016-10-19 MED ORDER — CYCLOSPORINE 0.05 % OP EMUL
1.0000 [drp] | Freq: Two times a day (BID) | OPHTHALMIC | Status: DC
  Administered 2016-10-19 – 2016-10-26 (×14): 1 [drp] via OPHTHALMIC
  Filled 2016-10-19 (×14): qty 1

## 2016-10-19 MED ORDER — LACTATED RINGERS IR SOLN
Status: DC | PRN
  Administered 2016-10-19: 3000 mL

## 2016-10-19 MED ORDER — DEXTROSE 5 % IV SOLN
2.0000 g | INTRAVENOUS | Status: AC
  Administered 2016-10-19: 2 g via INTRAVENOUS
  Filled 2016-10-19: qty 2

## 2016-10-19 MED ORDER — AMLODIPINE BESYLATE 5 MG PO TABS
5.0000 mg | ORAL_TABLET | Freq: Every day | ORAL | Status: DC
  Administered 2016-10-19 – 2016-10-23 (×5): 5 mg via ORAL
  Filled 2016-10-19 (×5): qty 1

## 2016-10-19 MED ORDER — BUPIVACAINE HCL (PF) 0.5 % IJ SOLN
INTRAMUSCULAR | Status: DC | PRN
  Administered 2016-10-19: 18 mL

## 2016-10-19 MED ORDER — SUCCINYLCHOLINE CHLORIDE 20 MG/ML IJ SOLN
INTRAMUSCULAR | Status: AC
  Filled 2016-10-19: qty 1

## 2016-10-19 MED ORDER — HYDROMORPHONE HCL 1 MG/ML IJ SOLN
INTRAMUSCULAR | Status: DC | PRN
  Administered 2016-10-19 (×4): 0.5 mg via INTRAVENOUS

## 2016-10-19 MED ORDER — ONDANSETRON HCL 4 MG/2ML IJ SOLN
4.0000 mg | INTRAMUSCULAR | Status: DC | PRN
  Administered 2016-10-19 – 2016-10-20 (×2): 4 mg via INTRAVENOUS
  Filled 2016-10-19 (×2): qty 2

## 2016-10-19 MED ORDER — MEPERIDINE HCL 50 MG/ML IJ SOLN: 6.2500 mg | INTRAMUSCULAR | Status: DC | PRN

## 2016-10-19 MED ORDER — LIDOCAINE 2% (20 MG/ML) 5 ML SYRINGE
INTRAMUSCULAR | Status: AC
  Filled 2016-10-19: qty 5

## 2016-10-19 MED ORDER — SERTRALINE HCL 100 MG PO TABS
100.0000 mg | ORAL_TABLET | Freq: Every day | ORAL | Status: DC
  Administered 2016-10-20 – 2016-10-26 (×7): 100 mg via ORAL
  Filled 2016-10-19 (×7): qty 1

## 2016-10-19 MED ORDER — CEFOTETAN DISODIUM-DEXTROSE 2-2.08 GM-% IV SOLR
INTRAVENOUS | Status: AC
Start: 2016-10-19 — End: 2016-10-19
  Filled 2016-10-19: qty 50

## 2016-10-19 MED ORDER — ONDANSETRON HCL 4 MG/2ML IJ SOLN
INTRAMUSCULAR | Status: DC | PRN
  Administered 2016-10-19: 4 mg via INTRAVENOUS

## 2016-10-19 MED ORDER — FENTANYL CITRATE (PF) 100 MCG/2ML IJ SOLN
INTRAMUSCULAR | Status: AC
  Filled 2016-10-19: qty 4

## 2016-10-19 MED ORDER — PROMETHAZINE HCL 25 MG/ML IJ SOLN: 6.2500 mg | INTRAMUSCULAR | Status: DC | PRN

## 2016-10-19 MED ORDER — LACTATED RINGERS IV SOLN: INTRAVENOUS | Status: DC

## 2016-10-19 MED ORDER — GLYCOPYRROLATE 0.2 MG/ML IV SOSY
PREFILLED_SYRINGE | INTRAVENOUS | Status: AC
  Filled 2016-10-19: qty 3

## 2016-10-19 MED ORDER — LACTATED RINGERS IV SOLN
INTRAVENOUS | Status: DC | PRN
  Administered 2016-10-19 (×3): via INTRAVENOUS

## 2016-10-19 MED ORDER — SODIUM CHLORIDE 0.9 % IJ SOLN
INTRAMUSCULAR | Status: AC
  Filled 2016-10-19: qty 10

## 2016-10-19 SURGICAL SUPPLY — 90 items
ADAPTER GOLDBERG URETERAL (ADAPTER) ×5 IMPLANT
APPLIER CLIP 5 13 M/L LIGAMAX5 (MISCELLANEOUS)
APPLIER CLIP ROT 10 11.4 M/L (STAPLE)
BLADE EXTENDED COATED 6.5IN (ELECTRODE) IMPLANT
BLADE HEX COATED 2.75 (ELECTRODE) ×5 IMPLANT
CABLE HIGH FREQUENCY MONO STRZ (ELECTRODE) ×5 IMPLANT
CATH FOLEY 2WAY SLVR  5CC 20FR (CATHETERS) ×2
CATH FOLEY 2WAY SLVR 5CC 20FR (CATHETERS) ×3 IMPLANT
CATH FOLEY 3WAY 30CC 22FR (CATHETERS) ×5 IMPLANT
CATH URET 5FR 28IN OPEN ENDED (CATHETERS) ×5 IMPLANT
CELLS DAT CNTRL 66122 CELL SVR (MISCELLANEOUS) IMPLANT
CLIP APPLIE 5 13 M/L LIGAMAX5 (MISCELLANEOUS) IMPLANT
CLIP APPLIE ROT 10 11.4 M/L (STAPLE) IMPLANT
CLOSURE WOUND 1/2 X4 (GAUZE/BANDAGES/DRESSINGS) ×1
COUNTER NEEDLE 20 DBL MAG RED (NEEDLE) ×5 IMPLANT
COVER SURGICAL LIGHT HANDLE (MISCELLANEOUS) ×5 IMPLANT
DECANTER SPIKE VIAL GLASS SM (MISCELLANEOUS) ×5 IMPLANT
DISSECTOR BLUNT TIP ENDO 5MM (MISCELLANEOUS) ×5 IMPLANT
DRAIN CHANNEL 19F RND (DRAIN) ×5 IMPLANT
DRAPE LAPAROSCOPIC ABDOMINAL (DRAPES) ×5 IMPLANT
DRAPE SHEET LG 3/4 BI-LAMINATE (DRAPES) ×5 IMPLANT
DRAPE SURG IRRIG POUCH 19X23 (DRAPES) ×5 IMPLANT
DRAPE UTILITY XL STRL (DRAPES) ×5 IMPLANT
DRSG OPSITE POSTOP 4X6 (GAUZE/BANDAGES/DRESSINGS) ×5 IMPLANT
ELECT REM PT RETURN 9FT ADLT (ELECTROSURGICAL) ×5
ELECTRODE REM PT RTRN 9FT ADLT (ELECTROSURGICAL) ×3 IMPLANT
EVACUATOR SILICONE 100CC (DRAIN) ×5 IMPLANT
FILTER SMOKE EVAC LAPAROSHD (FILTER) IMPLANT
GAUZE SPONGE 4X4 12PLY STRL (GAUZE/BANDAGES/DRESSINGS) ×5 IMPLANT
GLOVE BIOGEL PI IND STRL 7.0 (GLOVE) ×3 IMPLANT
GLOVE BIOGEL PI INDICATOR 7.0 (GLOVE) ×2
GLOVE ECLIPSE 8.0 STRL XLNG CF (GLOVE) ×10 IMPLANT
GLOVE INDICATOR 8.0 STRL GRN (GLOVE) ×10 IMPLANT
GOWN STRL REUS W/TWL LRG LVL3 (GOWN DISPOSABLE) ×5 IMPLANT
GOWN STRL REUS W/TWL XL LVL3 (GOWN DISPOSABLE) ×10 IMPLANT
GUIDEWIRE STR DUAL SENSOR (WIRE) ×10 IMPLANT
IRRIG SUCT STRYKERFLOW 2 WTIP (MISCELLANEOUS)
IRRIGATION SUCT STRKRFLW 2 WTP (MISCELLANEOUS) IMPLANT
LEGGING LITHOTOMY PAIR STRL (DRAPES) IMPLANT
LIGASURE IMPACT 36 18CM CVD LR (INSTRUMENTS) ×5 IMPLANT
NS IRRIG 1000ML POUR BTL (IV SOLUTION) ×10 IMPLANT
PACK COLON (CUSTOM PROCEDURE TRAY) ×5 IMPLANT
PAD POSITIONING PINK XL (MISCELLANEOUS) ×5 IMPLANT
RELOAD PROXIMATE 75MM BLUE (ENDOMECHANICALS) ×5 IMPLANT
RTRCTR WOUND ALEXIS 18CM MED (MISCELLANEOUS)
SCISSORS LAP 5X35 DISP (ENDOMECHANICALS) ×5 IMPLANT
SCISSORS LAP 5X45 EPIX DISP (ENDOMECHANICALS) ×5 IMPLANT
SEALER TISSUE X1 CVD JAW (INSTRUMENTS) IMPLANT
SET IRRIG Y TYPE TUR BLADDER L (SET/KITS/TRAYS/PACK) ×5 IMPLANT
SHEARS CURVED HARMONIC AC 45CM (MISCELLANEOUS) ×5 IMPLANT
SHEARS HARMONIC ACE PLUS 36CM (ENDOMECHANICALS) IMPLANT
SLEEVE XCEL OPT CAN 5 100 (ENDOMECHANICALS) ×5 IMPLANT
SOLUTION ANTI FOG 6CC (MISCELLANEOUS) ×5 IMPLANT
SPONGE DRAIN TRACH 4X4 STRL 2S (GAUZE/BANDAGES/DRESSINGS) ×5 IMPLANT
STAPLER CUT CVD 40MM GREEN (STAPLE) ×5 IMPLANT
STAPLER PROXIMATE 75MM BLUE (STAPLE) ×5 IMPLANT
STAPLER VISISTAT 35W (STAPLE) ×10 IMPLANT
STRIP CLOSURE SKIN 1/2X4 (GAUZE/BANDAGES/DRESSINGS) ×4 IMPLANT
SUCTION POOLE TIP (SUCTIONS) ×5 IMPLANT
SUT ETHILON 3 0 PS 1 (SUTURE) ×10 IMPLANT
SUT MON AB 4-0 SH 27 (SUTURE) ×10 IMPLANT
SUT PDS AB 1 CTX 36 (SUTURE) ×15 IMPLANT
SUT PDS AB 1 TP1 96 (SUTURE) IMPLANT
SUT PROLENE 2 0 SH DA (SUTURE) ×10 IMPLANT
SUT SILK 2 0 (SUTURE) ×4
SUT SILK 2 0 SH CR/8 (SUTURE) ×15 IMPLANT
SUT SILK 2 0SH CR/8 30 (SUTURE) ×15 IMPLANT
SUT SILK 2-0 18XBRD TIE 12 (SUTURE) ×6 IMPLANT
SUT SILK 3 0 (SUTURE) ×4
SUT SILK 3 0 SH CR/8 (SUTURE) ×10 IMPLANT
SUT SILK 3-0 18XBRD TIE 12 (SUTURE) ×6 IMPLANT
SUT VIC AB 2-0 SH 18 (SUTURE) ×10 IMPLANT
SUT VIC AB 2-0 SH 27 (SUTURE) ×4
SUT VIC AB 2-0 SH 27X BRD (SUTURE) ×6 IMPLANT
SUT VIC AB 3-0 SH 18 (SUTURE) ×15 IMPLANT
SUT VIC AB 3-0 SH 27 (SUTURE) ×4
SUT VIC AB 3-0 SH 27X BRD (SUTURE) ×6 IMPLANT
SUT VICRYL 2 0 18  UND BR (SUTURE) ×2
SUT VICRYL 2 0 18 UND BR (SUTURE) ×3 IMPLANT
SYS LAPSCP GELPORT 120MM (MISCELLANEOUS) ×5
SYSTEM LAPSCP GELPORT 120MM (MISCELLANEOUS) ×3 IMPLANT
TOWEL OR 17X26 10 PK STRL BLUE (TOWEL DISPOSABLE) ×5 IMPLANT
TOWEL OR NON WOVEN STRL DISP B (DISPOSABLE) ×5 IMPLANT
TRAY FOLEY BAG SILVER LF 14FR (CATHETERS) IMPLANT
TROCAR BLADELESS OPT 5 100 (ENDOMECHANICALS) ×5 IMPLANT
TROCAR XCEL BLUNT TIP 100MML (ENDOMECHANICALS) IMPLANT
TROCAR XCEL NON-BLD 11X100MML (ENDOMECHANICALS) IMPLANT
TROCAR XCEL UNIV SLVE 11M 100M (ENDOMECHANICALS) IMPLANT
TUBING INSUF HEATED (TUBING) ×5 IMPLANT
YANKAUER SUCT BULB TIP NO VENT (SUCTIONS) ×5 IMPLANT

## 2016-10-19 NOTE — Anesthesia Postprocedure Evaluation (Signed)
Anesthesia Post Note  Patient: Toni [name]  Procedure(s) Performed: Procedure(s) (LRB): LAPAROSCOPIC ASSISTED SIGMIOD COLOECTOMY MOBILIZATION OF SPLEENIC FLEXURE,COLOSTOMY (N/A) CYSTOSCOPY  Patient location during evaluation: PACU Anesthesia Type: General Level of consciousness: sedated, oriented and patient cooperative Pain management: pain level controlled Vital Signs Assessment: post-procedure vital signs reviewed and stable Respiratory status: spontaneous breathing, nonlabored ventilation, respiratory function stable and patient connected to nasal cannula oxygen Cardiovascular status: blood pressure returned to baseline and stable Postop Assessment: no signs of nausea or vomiting Anesthetic complications: no    Last Vitals:  Vitals:   10/19/16 1400 10/19/16 1500  BP: (!) 147/66 138/61  Pulse: 88   Resp: 11   Temp: (!) 36.1 C     Last Pain:  Vitals:   10/19/16 1415  TempSrc:   PainSc: 2                  Avril Busser,E. Marilyne Haseley

## 2016-10-19 NOTE — Interval H&P Note (Signed)
History and Physical Interval Note:  10/19/2016 7:15 AM  Toni [name]  has presented today for surgery, with the diagnosis of Diverticulitis with colovesical fistula  The various methods of treatment have been discussed with the patient and family. After consideration of risks, benefits and other options for treatment, the patient has consented to  Procedure(s): LAPAROSCOPIC PARTIAL COLECTOMY, POSSIBLE COLOSTOMY (N/A) CLOSURE OF CYSTOTOMY (N/A) as a surgical intervention .  The patient's history has been reviewed, patient examined, no change in status, stable for surgery.  I have reviewed the patient's chart and labs.  Questions were answered to the patient's satisfaction.     [name] Lenna Sciara

## 2016-10-19 NOTE — Op Note (Signed)
Date of procedure: 10/19/16  Preoperative diagnosis:  1. Colovesical fistula   Postoperative diagnosis:  1. Colovesical fistula   Procedure: 1. Exploratory laparotomy of bladder and left ureter 2. Cystoscopy 3. Left ureteral catheter placement  Surgeon: Baruch Gouty, MD  Anesthesia: General  Complications: None  Intraoperative findings: At the area of the presumed colovesical fistula there was a layer of colon adherent to the bladder. 400 cc of methylene blue dyed normal saline was installed into the bladder with no leakage of urine appreciated. The left ureter was able to be identified on exploration. A left ureteral catheter was easily placed via cystoscopy to the proximal ureter. Upon palpation in the abdomen, the ureteral catheter was within the ureter with no evidence for ureteral injury.  EBL: See Gen. surgery's dictation  Specimens: See general surgery's dictation  Drains: 20 French Foley catheter. A JP drain will be left for Gen. surgery  Disposition: Stable to the postanesthesia care unit  Indication for procedure: The patient is a 82 y.o. female with a colovesical fistula presents today for elective takedown of the fistula. Urology was present to assist in identification of the left ureter and evaluation of the bladder for repair.  After reviewing the management options for treatment, the patient elected to proceed with the above surgical procedure(s). We have discussed the potential benefits and risks of the procedure, side effects of the proposed treatment, the likelihood of the patient achieving the goals of the procedure, and any potential problems that might occur during the procedure or recuperation. Informed consent has been obtained.  Description of procedure: The patient was met in the preoperative area. All risks, benefits, and indications of the procedure were described in great detail. The patient consented to the procedure. Preoperative antibiotics were given.  The patient was taken to the operative theater. General anesthesia was induced per the anesthesia service. The patient was then placed in the dorsal lithotomy position and prepped and draped in the usual sterile fashion. A preoperative timeout was called.   Upon entering the OR, the patient had a midline lower abdominal incision. The colon had been previously partially resected by the Gen. surgery team. It was taken off the bladder with a layer of colon wall over the presumed fistulous tract site. There is no obvious entry into bladder mucosa. The bladder was instilled with 400 cc of methylene blue dyed normal saline which showed no extravasation of urine even though the bladder was completely filled and distended. At this point, the thought was that the layer of colon wall over the fistula essentially sealed the opening into the bladder, decision was made not to place any reinforcing sutures as not to cause unnecessary injury. Attention was then turned to identifying the left ureter. It was noted be medial to the left ovarian cyst. Peristalsis was noted. It then was followed distally through a very inflamed area of tissue were was difficult to palpate it. This point the decision was made perform cystoscopy and place a left ureteral stent. The patient's 52 French Foley catheter was removed and a 21 French 30 cystoscope was inserted the patient's bladder per urethra atraumatically. Palpation of her the presumed fistulous tract within the abdomen showed air of inflamed area within the bladder. Again there is no extravasation of fluid the bladder was completely distended. Sensor wire was then placed in the left ureteral orifice and advanced to the renal pelvis opening catheter was placed and easily advanced over the sensor wire. The cystoscope was withdrawn and the  ureteral catheter was left in place 20 French Foley catheter was then replaced. Upon returning to the abdominal incision, left ureteral catheter was  palpated proximal to the previously mentioned inflamed area of the distal ureter. It was also noted to be clear yellow urine coming from the left ureteral orifice prior to catheter placement. At this time, we do not think there was a ureteral injury. The left ureter catheter was left in place alongside the 20 French Foley catheter. It will be removed at the end of the procedure. The hours and transferred back to the Gen. surgery service to complete the case.  Plan: The patient was admitted to the hospital for postoperative recovery. She'll keep her catheter at least 2 weeks with a cystogram prior to removal. The general surgery service plans to leave a JP drain. We'll monitor this output closely and send fluid creatinines if necessary.  Baruch Gouty, M.D.

## 2016-10-19 NOTE — Anesthesia Preprocedure Evaluation (Addendum)
Anesthesia Evaluation  Patient identified by MRN, date of birth, ID band Patient awake    Reviewed: Allergy & Precautions, NPO status , Patient's Chart, lab work & pertinent test results  History of Anesthesia Complications Negative for: history of anesthetic complications  Airway Mallampati: I  TM Distance: >3 FB Neck ROM: Full    Dental  (+) Dental Advisory Given   Pulmonary former smoker,    breath sounds clear to auscultation       Cardiovascular hypertension, Pt. on medications and Pt. on home beta blockers (-) angina Rhythm:Regular Rate:Normal  '16 ECHO: LVEF at 60-65%. Normal wall motion. Grade 1DD and mild MR.   Neuro/Psych Anxiety Depression negative neurological ROS     GI/Hepatic Neg liver ROS, GERD  Medicated and Controlled,  Endo/Other  negative endocrine ROS  Renal/GU negative Renal ROS     Musculoskeletal   Abdominal   Peds  Hematology negative hematology ROS (+)   Anesthesia Other Findings   Reproductive/Obstetrics                            Anesthesia Physical Anesthesia Plan  ASA: II  Anesthesia Plan: General   Post-op Pain Management:    Induction: Intravenous  Airway Management Planned: Oral ETT  Additional Equipment:   Intra-op Plan:   Post-operative Plan: Extubation in OR  Informed Consent: I have reviewed the patients History and Physical, chart, labs and discussed the procedure including the risks, benefits and alternatives for the proposed anesthesia with the patient or authorized representative who has indicated his/her understanding and acceptance.   Dental advisory given  Plan Discussed with: CRNA and Surgeon  Anesthesia Plan Comments: (Plan routine monitors, GETA)        Anesthesia Quick Evaluation

## 2016-10-19 NOTE — Transfer of Care (Signed)
Immediate Anesthesia Transfer of Care Note  Patient: Toni [name]  Procedure(s) Performed: Procedure(s): [place] MOBILIZATION OF SPLEENIC FLEXURE,COLOSTOMY (N/A) CYSTOSCOPY  Patient Location: PACU  Anesthesia Type:General  Level of Consciousness:  sedated, patient cooperative and responds to stimulation  Airway & Oxygen Therapy:Patient Spontanous Breathing and Patient connected to face mask oxgen  Post-op Assessment:  Report given to PACU RN and Post -op Vital signs reviewed and stable  Post vital signs:  Reviewed and stable  Last Vitals:  Vitals:   10/19/16 0618  BP: 108/66  Pulse: 71  Resp: 16  Temp: 123XX123 C    Complications: No apparent anesthesia complications

## 2016-10-19 NOTE — H&P (View-Only) (Signed)
Toni [name] 09/03/2016 9:20 AM Location: [facility] Patient #: [id number] DOB: December 12, 1933 Married / Language: [name] / Race: White Female   History of Present Illness Toni Hollingshead MD; 09/03/2016 11:20 AM) The patient is a 82 year old female.  Note:She is here with her husband following her CT scan (results have been discussed with them) and her recent visit with the urologist (she does not remember his name). She is asymptomatic. She does not remember a lot about our discussion at her last visit. At that time, she was not interested in having surgery. Now she says she is interested in having the surgery. She feels well and is not having any significant pain or fever.  Allergies (April Staton, CMA; 09/03/2016 9:20 AM) Trandolapril *ANTIHYPERTENSIVES*  Medication History (April Staton, CMA; 09/03/2016 9:20 AM) Vitamin B12 (100MCG Tablet, Oral) Active. Donepezil HCl (10MG  Tablet, Oral) Active. Losartan Potassium (100MG  Tablet, Oral) Active. Sertraline HCl (50MG  Tablet, Oral) Active. HydrAZINE Sulfate Active. Metoprolol Tartrate (25MG  Tablet, Oral) Active. HydroCHLOROthiazide (25MG  Tablet, Oral) Active. Aspirin (81MG  Tablet, Oral) Active. AmLODIPine Besylate (5MG  Tablet, Oral) Active. Coenzyme Q10-Vitamin E (300-300MG -UNIT Wafer, Oral) Active. Probiotic Pearls (Oral) Active. Restasis (0.05% Emulsion, Ophthalmic) Active. Medications Reconciled  Vitals (April Staton CMA; 09/03/2016 9:21 AM) 09/03/2016 9:20 AM Weight: 157.5 lb Height: 67in Height was reported by patient. Body Surface Area: 1.83 m Body Mass Index: 24.67 kg/m  Temp.: 98.74F(Oral)  Pulse: 75 (Regular)  P.OX: 98% (Room air) BP: 150/80 (Sitting, Left Arm, Standard)       Physical Exam Toni Hollingshead MD; 09/03/2016 11:20 AM) The physical exam findings are as follows: Note:General-elderly female in no acute distress.  Eyes-no  icterus.  Cardiovascular-regular rate and rhythm.  Lungs-clear to auscultation.  Abdomen-soft, no tenderness or palpable mass.    Assessment & Plan Toni Hollingshead MD; 09/03/2016 11:20 AM) SIGMOID DIVERTICULITIS (K57.32) Current Plans Started Flagyl 500MG , 2 (two) Tablet SEE NOTE, #6, 09/03/2016, No Refill. Local Order: Take at 2pm, 3pm, and 10pm the day prior to your colon operation Pt Education - [facility] Free Text Education/Instructions: discussed with patient and provided information. 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 She remembers very little about her previous office visit. Her husband remembers it very well. She saw the urologist who stated that she wanted to have surgery, he would be glad to assist. We had a long discussion about the surgery including the possibility of colostomy. We also discussed that given her other health problems, risks of complications would be increased somewhat. I have explained the procedure and risks of colon resection. Risks include but are not limited to bleeding, infection, wound problems, anesthesia, anastomotic leak, need for colostomy, need for reoperative surgery, injury to intraabominal organs (such as intestine, spleen, kidney, bladder, ureter, etc.), ileus, irregular bowel habits.  Plan: I have asked her and her husband to think about it and call us in one week with her decision given at her last visit she did not want to have any surgery and now she is having second thoughts about that. If they decide to go ahead with the operation, we'll need a preoperative cardiology assessment. We'll give them preoperative bowel preparation instructions today.  Addendum:  She underwent her cardiac risk assessment and is not at increased risk.  She would like to proceed with the operation so we will start the scheduling process.

## 2016-10-19 NOTE — Op Note (Signed)
Operative Note  Toni [name] female 82 y.o. 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         Surgeon: Odis Hollingshead   Assistants: Fanny Skates M.D.  Anesthesia: General endotracheal anesthesia  Indications:   This is an 82 year old female with a colovesical fistula and a known history of diverticulitis. She continues to have pneumaturia and fecaluria. She now presents for the above procedure.    Procedure Detail:  She was seen in the holding area. She was brought to the operative room placed supine on the operating table and general anesthetic was given. She was placed in the lithotomy position. A Foley catheter was inserted. The abdominal wall and perianal areas were widely sterilely prepped and draped. A timeout was performed.  She's placed in slight reverse Trendelenburg position. A small incision was made in left upper quadrant. Using a 5 mm Optiview trocar and laparoscope, access was gained to the peritoneal cavity and a pneumoperitoneum created. Inspection of the area underneath the trocar demonstrated no evidence of bleeding or injury. I noted some adhesions in the lower midline and right lower quadrant area. A 5 mm trocar was placed just above the umbilicus in the midline. A 5 mm trocar was placed in the right lower quadrant. An 11 mm trocar was placed in the lower midline. The adhesions between the abdominal wall and the omentum in the area of the right lower quadrant and pelvis were divided sharply. An area of dense inflammation was noted again at the mid sigmoid colon and extending into the pelvis.  Using sharp dissection and Harmonic scalpel, I mobilized the left colon by dividing its lateral attachments. Then identified the splenic flexure. I dissected the omentum free from the distal  transverse colon by entering the lesser sac. Subsequently mobilized the splenic flexure using careful blunt dissection and the Harmonic scalpel. This allowed the descending colon to be mobilized inferiorly.  A 5 mm trocar was then placed in the left lower quadrant to help with mobilization.  A limited lower midline incision was made and a wound protection device placed. I then palpated the very firm indurated sigmoid colon which was densely adherent to the lateral sidewall and the bladder. Using finger fracture technique as well as sharp dissection, I separated the sigmoid colon from the bladder. I identified what I felt to be the fistula as well as separated the most dense portion there is a small colotomy made and there is a site suggestive of fistula. I then divided the sigmoid colon at its junction with the descending colon using the linear cutting stapler. I divided the mesentery close to the sigmoid colon heading down into the pelvis. I was able to mobilize the right lateral area and the posterior area because this tissue is soft. However the left lateral attachments to the pelvis were very dense and these were carefully mobilized using electrocautery and finger fracture technique. I then began trying to mobilize the proximal rectum free from the bladder and endopelvic adhesions. Each time I tried to separate the proximal rectum a proctotomy was made. The adhesions were so dense that I could not mobilize the anterior rectum safely. I decided I would have to do a partial colectomy and colostomy.  At this time, Dr. Pilar Jarvis performed his part of the procedure including inspecting the bladder as well as doing cystoscopy and placing a  open-ended left ureteral stent after identification of the ureter. There is no evidence of damage to the ureter.  Following that, I then divided the rectum sharply and so the rectal stump shut with interrupted 2-0 silk sutures. I introduced a #19 Blake drain through the right  lower quadrant trocar incision and placed it in the pelvis just posterior to the rectal stump. At a previously marked colostomy site in the left abdominal wall and made a circular incision through the skin and subcutaneous tissue. A cruciate incision was made in the anterior and posterior fascia. The descending colon segment was brought up through the incision. I then anchored to the descending colon to the anterior fascia with interrupted 2-0 Vicryl sutures.  On this, I copiously irrigated out the abdominal cavity. There is no evidence of bleeding or organ injury. Needle sponge and instrument counts were correct at this time. The peritoneum of the lower midline incision was closed with running 2-0 Vicryl suture. Fascia closed with a running #1 PDS suture. Subcutaneous tissues irrigated. Skin was closed with staples. The remaining trocars were removed and all trocar skin incisions were closed with 4-0 Monocryl subcuticular stitches. Steri-Strips were applied. The colostomy was then matured with interrupted 3-0 Vicryl sutures. A colostomy appliance was applied. Dry dressings were applied to all wounds.  She tolerated procedure well, without any apparent complications, was taken recovery room in satisfactory condition.   Estimated Blood Loss:  400 ml         Drains: #19 Blake drain in the pelvis                 Specimens: Sigmoid colon        Complications:  * No complications entered in OR log *         Disposition: PACU - hemodynamically stable.         Condition: stable

## 2016-10-19 NOTE — Anesthesia Procedure Notes (Signed)

## 2016-10-20 LAB — CBC
HEMATOCRIT: 36.6 % (ref 36.0–46.0)
HEMOGLOBIN: 12.4 g/dL (ref 12.0–15.0)
MCH: 30.4 pg (ref 26.0–34.0)
MCHC: 33.9 g/dL (ref 30.0–36.0)
MCV: 89.7 fL (ref 78.0–100.0)
Platelets: 316 10*3/uL (ref 150–400)
RBC: 4.08 MIL/uL (ref 3.87–5.11)
RDW: 13.9 % (ref 11.5–15.5)
WBC: 10.5 10*3/uL (ref 4.0–10.5)

## 2016-10-20 LAB — BASIC METABOLIC PANEL
ANION GAP: 9 (ref 5–15)
BUN: 23 mg/dL — ABNORMAL HIGH (ref 6–20)
CALCIUM: 8 mg/dL — AB (ref 8.9–10.3)
CO2: 23 mmol/L (ref 22–32)
Chloride: 104 mmol/L (ref 101–111)
Creatinine, Ser: 1.21 mg/dL — ABNORMAL HIGH (ref 0.44–1.00)
GFR calc Af Amer: 48 mL/min — ABNORMAL LOW (ref >60)
GFR, EST NON AFRICAN AMERICAN: 41 mL/min — AB (ref >60)
Glucose, Bld: 188 mg/dL — ABNORMAL HIGH (ref 65–99)
POTASSIUM: 3.4 mmol/L — AB (ref 3.5–5.1)
SODIUM: 136 mmol/L (ref 135–145)

## 2016-10-20 MED ORDER — ACETAMINOPHEN 10 MG/ML IV SOLN
1000.0000 mg | Freq: Three times a day (TID) | INTRAVENOUS | Status: DC | PRN
  Administered 2016-10-20 – 2016-10-21 (×2): 1000 mg via INTRAVENOUS
  Filled 2016-10-20 (×4): qty 100

## 2016-10-20 MED ORDER — LIP MEDEX EX OINT
TOPICAL_OINTMENT | CUTANEOUS | Status: AC
  Administered 2016-10-20: 09:00:00
  Filled 2016-10-20: qty 7

## 2016-10-20 MED ORDER — HYDROMORPHONE HCL 1 MG/ML IJ SOLN
0.2500 mg | INTRAMUSCULAR | Status: DC | PRN
  Administered 2016-10-20 (×2): 1 mg via INTRAVENOUS
  Administered 2016-10-21 – 2016-10-22 (×5): 0.5 mg via INTRAVENOUS
  Administered 2016-10-22: 1 mg via INTRAVENOUS
  Administered 2016-10-22: 0.5 mg via INTRAVENOUS
  Administered 2016-10-23 (×3): 1 mg via INTRAVENOUS
  Administered 2016-10-23: 0.25 mg via INTRAVENOUS
  Administered 2016-10-23 – 2016-10-25 (×5): 1 mg via INTRAVENOUS
  Filled 2016-10-20 (×19): qty 1

## 2016-10-20 NOTE — Progress Notes (Signed)
Patient ID: Toni [name], female   DOB: September 12, 1934, 82 y.o.   MRN: [id number]  1 Day Post-Op Subjective: Pt doing well.  No complaints aside from expected incisional pain.  Objective: Vital signs in last 24 hours: Temp:  [96.9 F (36.1 C)-98.7 F (37.1 C)] 98.3 F (36.8 C) (11/11 0800) Pulse Rate:  [75-97] 97 (11/11 0800) Resp:  [11-22] 17 (11/11 0800) BP: (117-164)/(56-82) 142/82 (11/11 0800) SpO2:  [88 %-100 %] 92 % (11/11 0800) Weight:  [75.8 kg (167 lb 1.7 oz)] 75.8 kg (167 lb 1.7 oz) (11/10 1400)  Intake/Output from previous day: 11/10 0701 - 11/11 0700 In: 4200 [I.V.:4100; IV Piggyback:100] Out: [id number] [Urine:815; Drains:140; Stool:350; Blood:300] Intake/Output this shift: No intake/output data recorded.  Physical Exam:  General: Alert and oriented Abd: Drain with serosanguineous drainage GU: Catheter with clear urine  Lab Results:  Recent Labs  10/19/16 1706 10/20/16 0315  HGB 12.8 12.4  HCT 37.0 36.6   BMET  Recent Labs  10/19/16 1706 10/20/16 0315  NA  --  136  K  --  3.4*  CL  --  104  CO2  --  23  GLUCOSE  --  188*  BUN  --  23*  CREATININE 1.12* 1.21*  CALCIUM  --  8.0*     Studies/Results: No results found.  Assessment/Plan: Continue catheter drainage.  Will check drain Cr prior to removal.  Will continue to follow.  Plan is for discharge with urethral catheter with cystogram as outpatient.   LOS: 1 day   Adalynne Steffensmeier,LES 10/20/2016, 9:23 AM

## 2016-10-20 NOTE — Progress Notes (Signed)
1 Day Post-Op  Subjective: Complains of pain but seems happy  Objective: Vital signs in last 24 hours: Temp:  [96.9 F (36.1 C)-98.7 F (37.1 C)] 97.1 F (36.2 C) (11/11 0400) Pulse Rate:  [75-92] 87 (11/11 0600) Resp:  [11-22] 19 (11/11 0600) BP: (117-164)/(56-74) 153/66 (11/11 0400) SpO2:  [88 %-100 %] 93 % (11/11 0600) Weight:  [75.8 kg (167 lb 1.7 oz)] 75.8 kg (167 lb 1.7 oz) (11/10 1400)    Intake/Output from previous day: 11/10 0701 - 11/11 0700 In: 4200 [I.V.:4100; IV Piggyback:100] Out: [id number] [Urine:815; Drains:140; Stool:350; Blood:300] Intake/Output this shift: No intake/output data recorded.  Resp: clear to auscultation bilaterally Cardio: regular rate and rhythm GI: soft, appropriately tender. incision looks good. ostomy viable with no output yet  Lab Results:   Recent Labs  10/19/16 1706 10/20/16 0315  WBC 9.0 10.5  HGB 12.8 12.4  HCT 37.0 36.6  PLT 325 316   BMET  Recent Labs  10/19/16 1706 10/20/16 0315  NA  --  136  K  --  3.4*  CL  --  104  CO2  --  23  GLUCOSE  --  188*  BUN  --  23*  CREATININE 1.12* 1.21*  CALCIUM  --  8.0*   PT/INR No results for input(s): LABPROT, INR in the last 72 hours. ABG No results for input(s): PHART, HCO3 in the last 72 hours.  Invalid input(s): PCO2, PO2  Studies/Results: No results found.  Anti-infectives: Anti-infectives    Start     Dose/Rate Route Frequency Ordered Stop   10/19/16 2200  cefoTEtan (CEFOTAN) 2 g in dextrose 5 % 50 mL IVPB     2 g 100 mL/hr over 30 Minutes Intravenous Every 12 hours 10/19/16 1638 10/19/16 2302   10/19/16 0611  cefoTEtan (CEFOTAN) 2 g in dextrose 5 % 50 mL IVPB     2 g 100 mL/hr over 30 Minutes Intravenous On call to O.R. 10/19/16 0611 10/19/16 0759      Assessment/Plan: s/p Procedure(s): LAPAROSCOPIC ASSISTED SIGMIOD COLOECTOMY MOBILIZATION OF SPLEENIC FLEXURE,COLOSTOMY (N/A) CYSTOSCOPY  POD 1 Allow sips of clears today  Modify pain meds to try to get  better control Continue to monitor in stepdown   LOS: 1 day    TOTH III,PAUL S 10/20/2016

## 2016-10-21 ENCOUNTER — Other Ambulatory Visit: Payer: Self-pay

## 2016-10-21 LAB — TROPONIN I
TROPONIN I: 0.03 ng/mL — AB (ref <0.03)
TROPONIN I: 0.04 ng/mL — AB (ref <0.03)
TROPONIN I: 0.09 ng/mL — AB (ref <0.03)
Troponin I: <0.03 ng/mL (ref <0.03)

## 2016-10-21 LAB — CK TOTAL AND CKMB (NOT AT ARMC)
CK, MB: 2.3 ng/mL (ref 0.5–5.0)
Relative Index: 1.2 (ref 0.0–2.5)
Total CK: 191 U/L (ref 38–234)

## 2016-10-21 MED ORDER — ACETAMINOPHEN 10 MG/ML IV SOLN
1000.0000 mg | Freq: Three times a day (TID) | INTRAVENOUS | Status: AC | PRN
  Administered 2016-10-21 – 2016-10-22 (×3): 1000 mg via INTRAVENOUS
  Filled 2016-10-21 (×4): qty 100

## 2016-10-21 NOTE — Progress Notes (Signed)
Cp c/o of cp, back pain, and bilateral arm pain.  Left arm pain c/o of throbbing sensation.  Informed Dr Excell Seltzer.  EKG and cardiac enzymes ordered.  Will continue to monitor.  Irven Baltimore, RN

## 2016-10-21 NOTE — Progress Notes (Signed)
Pt reports chest tightness pointing substernally with numbness and tingling in right hand and fingers.  VSS (see flowsheet). 12 lead complete showing NSR rate 87.   Paged and spoke with Dr. Marlou Starks. Troponins and cardiac enzymes x1 ordered per telephone order.  Pt is now reporting relief from tightening and less but continued numbness and tingling in right hand and fingers.  Will continue to monitor.

## 2016-10-21 NOTE — Progress Notes (Signed)
Patient ID: Toni [name], female   DOB: 04-Feb-1934, 82 y.o.   MRN: [id number]  2 Days Post-Op Subjective: No new complaints.  Objective: Vital signs in last 24 hours: Temp:  [98.3 F (36.8 C)-99.2 F (37.3 C)] 98.3 F (36.8 C) (11/12 1200) Pulse Rate:  [75-85] 82 (11/12 1425) Resp:  [14-25] 22 (11/12 1425) BP: (111-141)/(42-50) 137/48 (11/12 1425) SpO2:  [92 %-97 %] 97 % (11/12 1425)  Intake/Output from previous day: 11/11 0701 - 11/12 0700 In: 2520 [P.O.:70; I.V.:2400; IV Piggyback:50] Out: 930 [Urine:770; Drains:160] Intake/Output this shift: Total I/O In: 400 [I.V.:400] Out: 30 [Drains:30]   Lab Results:  Recent Labs  10/19/16 1706 10/20/16 0315  HGB 12.8 12.4  HCT 37.0 36.6   BMET  Recent Labs  10/19/16 1706 10/20/16 0315  NA  --  136  K  --  3.4*  CL  --  104  CO2  --  23  GLUCOSE  --  188*  BUN  --  23*  CREATININE 1.12* 1.21*  CALCIUM  --  8.0*     Studies/Results: No results found.  Assessment/Plan: Continue urethral catheter and drain.   LOS: 2 days   [name],LES 10/21/2016, 4:47 PM

## 2016-10-21 NOTE — Progress Notes (Signed)
CRITICAL VALUE ALERT  Critical value received:  Troponin 0.03  Date of notification:  10/21/2016  Time of notification:  1010  Critical value read back:Yes.    Nurse who received alert:  Jeannie Fend RN  MD notified (1st page):  Dr. Excell Seltzer "in person"  Time of first page:  1010  In person  MD notified (2nd page):  Time of second page:  Responding MD:  Dr. Excell Seltzer  Time MD responded:  1010

## 2016-10-21 NOTE — Progress Notes (Signed)
2 Days Post-Op  Subjective: Complains of pain but seems manageable  Objective: Vital signs in last 24 hours: Temp:  [98.3 F (36.8 C)-99.2 F (37.3 C)] 99.2 F (37.3 C) (11/12 0400) Pulse Rate:  [75-97] 84 (11/12 0400) Resp:  [14-20] 20 (11/12 0400) BP: (111-142)/(42-82) 136/50 (11/12 0400) SpO2:  [92 %-96 %] 92 % (11/12 0400)    Intake/Output from previous day: 11/11 0701 - 11/12 0700 In: 2420 [P.O.:70; I.V.:2300; IV Piggyback:50] Out: 930 [Urine:770; Drains:160] Intake/Output this shift: No intake/output data recorded.  Resp: clear to auscultation bilaterally Cardio: regular rate and rhythm GI: soft, appropriately tender. incision looks good. ostomy viable with no output yet  Lab Results:   Recent Labs  10/19/16 1706 10/20/16 0315  WBC 9.0 10.5  HGB 12.8 12.4  HCT 37.0 36.6  PLT 325 316   BMET  Recent Labs  10/19/16 1706 10/20/16 0315  NA  --  136  K  --  3.4*  CL  --  104  CO2  --  23  GLUCOSE  --  188*  BUN  --  23*  CREATININE 1.12* 1.21*  CALCIUM  --  8.0*   PT/INR No results for input(s): LABPROT, INR in the last 72 hours. ABG No results for input(s): PHART, HCO3 in the last 72 hours.  Invalid input(s): PCO2, PO2  Studies/Results: No results found.  Anti-infectives: Anti-infectives    Start     Dose/Rate Route Frequency Ordered Stop   10/19/16 2200  cefoTEtan (CEFOTAN) 2 g in dextrose 5 % 50 mL IVPB     2 g 100 mL/hr over 30 Minutes Intravenous Every 12 hours 10/19/16 1638 10/19/16 2302   10/19/16 0611  cefoTEtan (CEFOTAN) 2 g in dextrose 5 % 50 mL IVPB     2 g 100 mL/hr over 30 Minutes Intravenous On call to O.R. 10/19/16 0611 10/19/16 0759      Assessment/Plan: s/p Procedure(s): LAPAROSCOPIC ASSISTED SIGMIOD COLOECTOMY MOBILIZATION OF SPLEENIC FLEXURE,COLOSTOMY (N/A) CYSTOSCOPY continue bowel rest until bowel function returns  OOB POD 2  LOS: 2 days    TOTH III,PAUL S 10/21/2016

## 2016-10-22 ENCOUNTER — Encounter (HOSPITAL_COMMUNITY): Payer: Self-pay | Admitting: General Surgery

## 2016-10-22 DIAGNOSIS — I200 Unstable angina: Secondary | ICD-10-CM

## 2016-10-22 DIAGNOSIS — I10 Essential (primary) hypertension: Secondary | ICD-10-CM

## 2016-10-22 DIAGNOSIS — E7800 Pure hypercholesterolemia, unspecified: Secondary | ICD-10-CM

## 2016-10-22 LAB — CK TOTAL AND CKMB (NOT AT ARMC)
CK, MB: 1.2 ng/mL (ref 0.5–5.0)
RELATIVE INDEX: 0.8 (ref 0.0–2.5)
Total CK: 152 U/L (ref 38–234)

## 2016-10-22 LAB — BASIC METABOLIC PANEL
ANION GAP: 4 — AB (ref 5–15)
BUN: 12 mg/dL (ref 6–20)
CALCIUM: 8.1 mg/dL — AB (ref 8.9–10.3)
CO2: 22 mmol/L (ref 22–32)
Chloride: 114 mmol/L — ABNORMAL HIGH (ref 101–111)
Creatinine, Ser: 0.76 mg/dL (ref 0.44–1.00)
GFR calc Af Amer: >60 mL/min (ref >60)
GLUCOSE: 119 mg/dL — AB (ref 65–99)
Potassium: 3.5 mmol/L (ref 3.5–5.1)
Sodium: 140 mmol/L (ref 135–145)

## 2016-10-22 LAB — CBC
HCT: 30.5 % — ABNORMAL LOW (ref 36.0–46.0)
Hemoglobin: 10.2 g/dL — ABNORMAL LOW (ref 12.0–15.0)
MCH: 30.4 pg (ref 26.0–34.0)
MCHC: 33.4 g/dL (ref 30.0–36.0)
MCV: 90.8 fL (ref 78.0–100.0)
PLATELETS: 277 10*3/uL (ref 150–400)
RBC: 3.36 MIL/uL — ABNORMAL LOW (ref 3.87–5.11)
RDW: 13.6 % (ref 11.5–15.5)
WBC: 9.2 10*3/uL (ref 4.0–10.5)

## 2016-10-22 LAB — MRSA PCR SCREENING: MRSA BY PCR: NEGATIVE

## 2016-10-22 LAB — TROPONIN I: TROPONIN I: 0.05 ng/mL — AB (ref <0.03)

## 2016-10-22 MED ORDER — NITROGLYCERIN 0.4 MG SL SUBL
0.4000 mg | SUBLINGUAL_TABLET | SUBLINGUAL | Status: DC | PRN
  Administered 2016-10-22 – 2016-10-26 (×4): 0.4 mg via SUBLINGUAL
  Filled 2016-10-22 (×4): qty 1

## 2016-10-22 MED ORDER — ASPIRIN 325 MG PO TABS
325.0000 mg | ORAL_TABLET | Freq: Every day | ORAL | Status: DC
  Administered 2016-10-22 – 2016-10-24 (×3): 325 mg via ORAL
  Filled 2016-10-22 (×3): qty 1

## 2016-10-22 MED ORDER — METOPROLOL TARTRATE 25 MG PO TABS
25.0000 mg | ORAL_TABLET | Freq: Once | ORAL | Status: AC
  Administered 2016-10-22: 25 mg via ORAL
  Filled 2016-10-22: qty 1

## 2016-10-22 MED ORDER — ATORVASTATIN CALCIUM 10 MG PO TABS
20.0000 mg | ORAL_TABLET | Freq: Every day | ORAL | Status: DC
  Administered 2016-10-22 – 2016-10-25 (×4): 20 mg via ORAL
  Filled 2016-10-22 (×4): qty 2

## 2016-10-22 MED ORDER — ASPIRIN EC 81 MG PO TBEC: 81.0000 mg | DELAYED_RELEASE_TABLET | Freq: Every day | ORAL | Status: DC

## 2016-10-22 MED ORDER — NITROGLYCERIN 2 % TD OINT
1.0000 [in_us] | TOPICAL_OINTMENT | Freq: Four times a day (QID) | TRANSDERMAL | Status: DC
  Administered 2016-10-22 – 2016-10-24 (×8): 1 [in_us] via TOPICAL
  Filled 2016-10-22: qty 30

## 2016-10-22 MED ORDER — METOPROLOL TARTRATE 50 MG PO TABS
50.0000 mg | ORAL_TABLET | Freq: Two times a day (BID) | ORAL | Status: DC
  Administered 2016-10-22 – 2016-10-26 (×8): 50 mg via ORAL
  Filled 2016-10-22: qty 2
  Filled 2016-10-22 (×2): qty 1
  Filled 2016-10-22 (×2): qty 2
  Filled 2016-10-22 (×2): qty 1
  Filled 2016-10-22: qty 2

## 2016-10-22 NOTE — Progress Notes (Signed)
CRITICAL VALUE ALERT  Critical value received:  Troponin 0.09  Date of notification:  10/22/2016  Time of notification:  0140  Critical value read back:Yes.    Nurse who received alert:  Herbie Baltimore, RN  MD notified (1st page):  Hoxworth, Ben  Time of first page:  0153  MD notified (2nd page):  Time of second page:  Responding MD:  Adonis Housekeeper   Time MD responded:  [phone]

## 2016-10-22 NOTE — Progress Notes (Signed)
Date:  October 22, 2016 Chart reviewed for concurrent status and case management needs. Will continue to follow patient progress. Discharge Planning: following for needs Expected discharge date: [id number] Rhonda Davis, BSN, [place], [place]

## 2016-10-22 NOTE — Progress Notes (Signed)
Assessment Active Problems:   Colovesical fistula s/p lap assisted sigmoid colectomy and colostomy 10/19/16-having some liquid stool and gas per colostomy    Substernal chest pain/pressure-no ischemic changes on EKG but Troponin slowly rising    HTN-on some home meds     Plan:  Consult Cardiology.  Clear liquids.  Check labs.   LOS: 3 days     3 Days Post-Op  Subjective: Home intermittent substernal chest pressure.  No dyspnea.  Daughter in room.  We discussed operating findings and what was done.  Objective: Vital signs in last 24 hours: Temp:  [97.7 F (36.5 C)-99.6 F (37.6 C)] 97.7 F (36.5 C) (11/13 0638) Pulse Rate:  [67-96] 67 (11/13 0600) Resp:  [19-25] 20 (11/13 0600) BP: (137-149)/(48-59) 149/59 (11/13 0000) SpO2:  [90 %-97 %] 92 % (11/13 0600)    Intake/Output from previous day: 11/12 0701 - 11/13 0700 In: 2500 [I.V.:2300; IV Piggyback:200] Out: 1100 [Urine:775; Drains:75; Stool:250] Intake/Output this shift: No intake/output data recorded.  PE: General- In NAD CV-RRR Lungs-wheeze on right Abdomen-soft, dressings dry, mild distension, few bowel sounds, serosanguinous drain output, colostomy edematous with green liquid stool and gas output  Lab Results:   Recent Labs  10/19/16 1706 10/20/16 0315  WBC 9.0 10.5  HGB 12.8 12.4  HCT 37.0 36.6  PLT 325 316   BMET  Recent Labs  10/19/16 1706 10/20/16 0315  NA  --  136  K  --  3.4*  CL  --  104  CO2  --  23  GLUCOSE  --  188*  BUN  --  23*  CREATININE 1.12* 1.21*  CALCIUM  --  8.0*   PT/INR No results for input(s): LABPROT, INR in the last 72 hours. Comprehensive Metabolic Panel:    Component Value Date/Time   NA 136 10/20/2016 0315   NA 139 10/16/2016 1436   K 3.4 (L) 10/20/2016 0315   K 4.1 10/16/2016 1436   CL 104 10/20/2016 0315   CL 104 10/16/2016 1436   CO2 23 10/20/2016 0315   CO2 27 10/16/2016 1436   BUN 23 (H) 10/20/2016 0315   BUN 27 (H) 10/16/2016 1436   CREATININE  1.21 (H) 10/20/2016 0315   CREATININE 1.12 (H) 10/19/2016 1706   GLUCOSE 188 (H) 10/20/2016 0315   GLUCOSE 94 10/16/2016 1436   CALCIUM 8.0 (L) 10/20/2016 0315   CALCIUM 9.5 10/16/2016 1436   AST 18 10/16/2016 1436   AST 14 04/30/2016 1549   ALT 14 10/16/2016 1436   ALT 15 04/30/2016 1549   ALKPHOS 72 10/16/2016 1436   ALKPHOS 95 04/30/2016 1549   BILITOT 0.7 10/16/2016 1436   BILITOT 0.4 04/30/2016 1549   PROT 7.8 10/16/2016 1436   PROT 6.9 04/30/2016 1549   ALBUMIN 4.3 10/16/2016 1436   ALBUMIN 3.7 04/30/2016 1549     Studies/Results: No results found.  Anti-infectives: Anti-infectives    Start     Dose/Rate Route Frequency Ordered Stop   10/19/16 2200  cefoTEtan (CEFOTAN) 2 g in dextrose 5 % 50 mL IVPB     2 g 100 mL/hr over 30 Minutes Intravenous Every 12 hours 10/19/16 1638 10/19/16 2302   10/19/16 0611  cefoTEtan (CEFOTAN) 2 g in dextrose 5 % 50 mL IVPB     2 g 100 mL/hr over 30 Minutes Intravenous On call to O.R. 10/19/16 0611 10/19/16 0759       Rhanda Lemire J 10/22/2016

## 2016-10-22 NOTE — Progress Notes (Signed)
Patient ID: Toni [name], female   DOB: [birth year], 82 y.o.   MRN: [id number]  3 Days Post-Op Subjective: No events overnight Doing well. No n/v. +air in ostomy bag Tolerating foley JP drain only put out 75 cc last 24 hrs  Objective: Vital signs in last 24 hours: Temp:  [97.7 F (36.5 C)-99.6 F (37.6 C)] 97.7 F (36.5 C) (11/13 0638) Pulse Rate:  [67-96] 67 (11/13 0600) Resp:  [19-25] 20 (11/13 0600) BP: (137-149)/(48-59) 149/59 (11/13 0000) SpO2:  [90 %-97 %] 92 % (11/13 0600)  Intake/Output from previous day: 11/12 0701 - 11/13 0700 In: 2500 [I.V.:2300; IV Piggyback:200] Out: 1100 [Urine:775; Drains:75; Stool:250] Intake/Output this shift: No intake/output data recorded.   NAD Soft approp T mild D. Ostomy productive of air Foley clear JP ss  Lab Results:  Recent Labs  10/19/16 1706 10/20/16 0315  HGB 12.8 12.4  HCT 37.0 36.6   BMET  Recent Labs  10/19/16 1706 10/20/16 0315  NA  --  136  K  --  3.4*  CL  --  104  CO2  --  23  GLUCOSE  --  188*  BUN  --  23*  CREATININE 1.12* 1.21*  CALCIUM  --  8.0*     Studies/Results: No results found.  Assessment/Plan: POD 3 s/p colovesical fistula takedown, sigmoid colectomy, colostomy by gen surg and bladder inspection, cystoscopy, left ureteral catheter placement by urology. Doing well. -continue foley. Patient has cystogram scheduled for next week as outpt.  -minimal JP output -> ok to d/c JP drain from urology standpoint. -urology will sign off. Please call with questions   LOS: 3 days   Nickie Retort 10/22/2016, 7:48 AM

## 2016-10-22 NOTE — Consult Note (Signed)
[facility] Nurse ostomy consult note Stoma type/location: LLQ colostomy stoma Stomal assessment/size: 1 and 5/8 inches round, moist, slightly budded, edematous. Minimal bowel sloughing from 11-3 o'clock Peristomal assessment: intact, sutures intact Treatment options for stomal/peristomal skin: None today, will add skin barrier ring with next pouch change Output None today.  Early rumblings for bowel wounds, and some scant serosanguinous drainage in operative pouch noted. Ostomy pouching: 1pc.convex pouch will add skin barrier ring with next pouch change Education provided: Patient does not recall me from preoperative appointment, husband is glad for first look at stoma and to see pouch change and stoma sizing, saying "I think I will be able to learn this" through tears.   Enrolled patient in [facility] program: No WOC nursing team will follow, and will remain available to this patient, the nursing, surgical and medical teams. Thanks, Maudie Flakes, MSN, RN, [place], Ar[facility] Abbott  Pager# [phone]

## 2016-10-22 NOTE — Consult Note (Signed)
Reason for Consult: abnormal troponin and chest pain  Referring Physician: Dr. Zella Richer    PCP:  Binnie Rail, MD  Primary Cardiologist:Dr. Ardena Gangl is an 82 y.o. female.    Chief Complaint: admitted for colovesical fistula and underwent Laparoscopic-assisted sigmoid colectomy with mobilization of splenic flexure, descending colostomy along with expl. Laparotomy of bladder and Lt  Ureter with Lt ureteral cath. Placement.  Now with elevated troponins and chest heaviness- though this heaviness has been going on- episodically for about a year.    HPI:  82 year old female that had cardiac eval prior to surgery with no prior cardiac hx, she has HTN and FH of CAD with her father at 56 yrs.  2-D echocardiogram in April 2016 that was a normal study. Left ventricular ejection fraction was normal with EF of 60-65% with normal wall motion. Grade 1 diastolic dysfunction was noted however no significant valvular abnormalities. EKG on visit with normal sinus rhythm without ischemia.  She did not mention this heaviness to cardiology because she was told it was stress.    For about a year while sitting never with exertion she develops chest heaviness like a boot on her chest -mid sternal, without radiation.  Maybe some nausea and pt denies SOB but her daughter states she does become SOB.  No diaphoresis.  It has never wakened her from sleep.   Her husband notes that at times she has some rapid breathing in her sleep.  Rare snoring usually on her back.  No symptoms with walking or climbing steps.  AT home she keeps reading and symptoms resolve.  She has not had to take any meds for this. She does have mild dementia and her husband helps with her meds.  Yesterday early AM she developed the pressure and complaints of numbness and tingling in Rt hand and fingers.  Then several hours later she had another episode that was chest pain, back pain and bil. Arm pain.  Lt arm with  throbbing sensation.  Troponins with increase.  < 0.03; 0.03; 0.04; 0.09 EKG without changes.  The discomfort the nurse describes seems more intense and with radiation than what she has been having at home.    Today she has been given 2 SL NTG with improvement per her daughter.  She is on Metoprolol 25 BID, amlodipine 5 mg daily, hydralazine 25 mg BID and at home in addition  HCTZ 25 mg daily and losartan 100 mg daily.   She began mild heaviness while I talked wit her.  Past Medical History:  Diagnosis Date  . Acute kidney injury ([place])   . C. difficile colitis 2016  . Colonic polyp   . Depression   . Diverticulosis   . GERD (gastroesophageal reflux disease)   . Headache    sinus  . Helicobacter pylori gastritis 1987  . Hepatic cyst   . Hyperlipemia   . Hypertension   . Pneumonia    hx of x 2   . PUD (peptic ulcer disease) 1987   with h pylori    Past Surgical History:  Procedure Laterality Date  . ABDOMINAL HYSTERECTOMY     BSO ; endometriosis; ? Appendectomy incidentally  . CHOLECYSTECTOMY  2008  . COLONOSCOPY  2003 & 2012   polyps; Dr Collene Mares  . CYSTOSCOPY  10/19/2016   Procedure: CYSTOSCOPY;  Surgeon: Nickie Retort, MD;  Location: [facility];  Service: Urology;;  . endoscopy gastritis  2008  . FLEXIBLE SIGMOIDOSCOPY N/A 05/31/2016   Procedure: FLEXIBLE SIGMOIDOSCOPY;  Surgeon: Juanita Craver, MD;  Location: [facility];  Service: Endoscopy;  Laterality: N/A;  . LAPAROSCOPIC PARTIAL COLECTOMY N/A 10/19/2016   Procedure: LAPAROSCOPIC ASSISTED SIGMIOD COLOECTOMY MOBILIZATION OF SPLEENIC [place];  Surgeon: Jackolyn Confer, MD;  Location: [facility];  Service: General;  Laterality: N/A;  . ROTATOR CUFF REPAIR     right  . TONSILLECTOMY AND ADENOIDECTOMY      Family History  Problem Relation Age of Onset  . Asthma Brother   . Hypertension Father   . Heart attack Father 60  . Leukemia Mother   . Stomach cancer Maternal Aunt   . Breast cancer Sister   . Thyroid disease  Sister   . Stroke Neg Hx   . Diabetes Neg Hx    Social History:  reports that she quit smoking about 42 years ago. She has never used smokeless tobacco. She reports that she drinks alcohol. She reports that she does not use drugs.  Allergies: No Known Allergies  OUTPATIENT MEDICATIONS: No current facility-administered medications on file prior to encounter.    Current Outpatient Prescriptions on File Prior to Encounter  Medication Sig Dispense Refill  . amLODipine (NORVASC) 5 MG tablet TAKE 1 TABLET BY MOUTH EVERY DAY 90 tablet 3  . aspirin 81 MG tablet Take 81 mg by mouth daily.    . Coenzyme Q10 300 MG CAPS Take 1 capsule by mouth daily.     . cyanocobalamin 500 MCG tablet Take 500 mcg by mouth daily.    Marland Kitchen donepezil (ARICEPT) 10 MG tablet Take 1 tablet daily (Patient taking differently: Take 10 mg by mouth at bedtime. ) 90 tablet 3  . hydrALAZINE (APRESOLINE) 25 MG tablet Take 1 tablet (25 mg total) by mouth 2 (two) times daily. 180 tablet 0  . hydrochlorothiazide (HYDRODIURIL) 25 MG tablet TAKE 1 TABLET BY MOUTH EVERY DAY 90 tablet 2  . losartan (COZAAR) 100 MG tablet Take 100 mg by mouth daily.  3  . metoprolol tartrate (LOPRESSOR) 25 MG tablet TAKE 1 TABLET (25 MG TOTAL) BY MOUTH 2 (TWO) TIMES DAILY. 180 tablet 2  . Probiotic Product (PROBIOTIC DAILY PO) Take 1 tablet by mouth daily.      CURRENT MEDICATIONS: Scheduled Meds: . amLODipine  5 mg Oral Daily  . cycloSPORINE  1 drop Both Eyes BID  . donepezil  10 mg Oral QHS  . enoxaparin (LOVENOX) injection  40 mg Subcutaneous Q24H  . famotidine (PEPCID) IV  20 mg Intravenous Q24H  . hydrALAZINE  25 mg Oral BID  . metoprolol tartrate  25 mg Oral BID  . sertraline  100 mg Oral Daily   Continuous Infusions: . dextrose 5 % and 0.9 % NaCl with KCl 20 mEq/L 75 mL/hr at 10/22/16 0917   PRN Meds:.acetaminophen, HYDROmorphone (DILAUDID) injection, nitroGLYCERIN, ondansetron **OR** ondansetron (ZOFRAN) IV   Results for orders placed  or performed during the hospital encounter of 10/19/16 (from the past 48 hour(s))  Troponin I     Status: None   Collection Time: 10/21/16  1:42 AM  Result Value Ref Range   Troponin I <0.03 <0.03 ng/mL  CK total and CKMB (cardiac)not at [facility]     Status: None   Collection Time: 10/21/16  1:42 AM  Result Value Ref Range   Total CK 191 38 - 234 U/L   CK, MB 2.3 0.5 - 5.0 ng/mL   Relative Index 1.2 0.0 - 2.5  Comment: Performed at [facility]  Troponin I (q 6hr x 3)     Status: Abnormal   Collection Time: 10/21/16  8:40 AM  Result Value Ref Range   Troponin I 0.03 (HH) <0.03 ng/mL    Comment: CRITICAL RESULT CALLED TO, READ BACK BY AND VERIFIED WITH: TUCHMAN,D RN [id number] COVINGTON,N   Troponin I (q 6hr x 3)     Status: Abnormal   Collection Time: 10/21/16  1:50 PM  Result Value Ref Range   Troponin I 0.04 (HH) <0.03 ng/mL    Comment: CRITICAL VALUE NOTED.  VALUE IS CONSISTENT WITH PREVIOUSLY REPORTED AND CALLED VALUE.  Troponin I (q 6hr x 3)     Status: Abnormal   Collection Time: 10/21/16  8:17 PM  Result Value Ref Range   Troponin I 0.09 (HH) <0.03 ng/mL    Comment: CRITICAL VALUE NOTED.  VALUE IS CONSISTENT WITH PREVIOUSLY REPORTED AND CALLED VALUE.  CK total and CKMB (cardiac)not at [facility]     Status: None   Collection Time: 10/21/16  8:17 PM  Result Value Ref Range   Total CK 152 38 - 234 U/L   CK, MB 1.2 0.5 - 5.0 ng/mL   Relative Index 0.8 0.0 - 2.5    Comment: Performed at [facility] metabolic panel     Status: Abnormal   Collection Time: 10/22/16  8:07 AM  Result Value Ref Range   Sodium 140 135 - 145 mmol/L   Potassium 3.5 3.5 - 5.1 mmol/L   Chloride 114 (H) 101 - 111 mmol/L   CO2 22 22 - 32 mmol/L   Glucose, Bld 119 (H) 65 - 99 mg/dL   BUN 12 6 - 20 mg/dL   Creatinine, Ser 0.76 0.44 - 1.00 mg/dL   Calcium 8.1 (L) 8.9 - 10.3 mg/dL   GFR calc non Af Amer >60 >60 mL/min   GFR calc Af Amer >60 >60 mL/min    Comment: (NOTE) The eGFR has  been calculated using the CKD EPI equation. This calculation has not been validated in all clinical situations. eGFR's persistently <60 mL/min signify possible Chronic Kidney Disease.    Anion gap 4 (L) 5 - 15  CBC     Status: Abnormal   Collection Time: 10/22/16  8:07 AM  Result Value Ref Range   WBC 9.2 4.0 - 10.5 K/uL   RBC 3.36 (L) 3.87 - 5.11 MIL/uL   Hemoglobin 10.2 (L) 12.0 - 15.0 g/dL   HCT 30.5 (L) 36.0 - 46.0 %   MCV 90.8 78.0 - 100.0 fL   MCH 30.4 26.0 - 34.0 pg   MCHC 33.4 30.0 - 36.0 g/dL   RDW 13.6 11.5 - 15.5 %   Platelets 277 150 - 400 K/uL   No results found.  ROS: General:no colds or fevers, no weight changes Skin:no rashes or ulcers HEENT:no blurred vision, no congestion CV:see HPI PUL:see HPI GI:no diarrhea constipation or melena, no indigestion GU:no hematuria, no dysuria MS:no joint pain, no claudication Neuro:no syncope, no lightheadedness Endo:no diabetes, no thyroid disease   Blood pressure (!) 142/62, pulse 86, temperature 97.7 F (36.5 C), temperature source Axillary, resp. rate 19, height _0  (1.727 m), weight 167 lb 1.7 oz (75.8 kg), SpO2 94 %.  Wt Readings from Last 3 Encounters:  10/19/16 167 lb 1.7 oz (75.8 kg)  10/16/16 158 lb (71.7 kg)  09/28/16 158 lb 9 oz (71.9 kg)    PE: General:Pleasant affect, NAD Skin:Warm and  dry, brisk capillary refill HEENT:normocephalic, sclera clear, mucus membranes moist Neck:supple, no JVD, no bruits  Heart:S1S2 RRR without murmur, gallup, rub or click Lungs:clear, ant. without rales, rhonchi, or wheezes AQT:MAUQ, Mild tenderness, + BS- high pitched, colostomy on Lt, dressings on rt., do not palpate liver spleen or masses Ext:no lower ext edema, 2+ pedal pulses, 2+ radial pulses Neuro:alert and oriented X 3, MAE, follows commands, + facial symmetry TELE:  SR   Assessment/Plan 1. Chest pain, overall more intense than her heaviness at home.  Also with increase in troponin, will add NTG paste and Dr.  Sallyanne Kuster will see.  Treat medically for now but will need ischemic workup once she has healed unless increase of symptoms. Elevated troponin.  Mild. Will check echo.  2.  Colovesical fistula s/p lap assisted sigmoid colectomy and colostomy 10/19/16-having some liquid stool and gas per colostomy per surgery  3.  HTN on most of home meds.   4. CKD3 though now cr improved.    Cecilie Kicks  Nurse Practitioner Certified [place] Pager [phone] or after 5pm or weekends call [phone] 10/22/2016, 10:34 AM    I have seen and examined the patient along with Cecilie Kicks, NP.  I have reviewed the chart, notes and new data.  I agree with NP's note.  Key new complaints: her symptoms are indeed worrisome for angina pectoris, stable angina at home with slight pattern of acceleration postop, likely due to increased adrenergic state and mild anemia. Key examination changes: no CHF, no arrhythmia, normal CV exam Key new findings / data: minimal abnormality in troponin. Low risk ECG.  PLAN: I believe she requires further cardiac evaluation, but she firmly requests the least invasive approach. This is reasonable as long as she does not have prolonged chest discomfort (>15-20 minutes) or ECG changes.  Will check the echo that has been ordered. If it shows significant regional wall motion abnormalities or a drop in LVEF, I would proceed directly to coronary angiography. If the echo is normal, will schedule for outpatient Lexiscan Myoview.  Meanwhile, increase beta blocker dose and DC hydralazine. Would target a resting heart rate in high 50s-low 60s and SBP 130 or less. Can also increase amlodipine or add long acting oral nitrates to achieve this goal and angina relief. Start statin and ASA. Note LDL 148 in 2015.   Sanda Klein, MD, [place] [phone] 10/22/2016, 2:01 PM

## 2016-10-23 ENCOUNTER — Inpatient Hospital Stay (HOSPITAL_COMMUNITY): Payer: Medicare Other

## 2016-10-23 DIAGNOSIS — I200 Unstable angina: Secondary | ICD-10-CM

## 2016-10-23 DIAGNOSIS — R079 Chest pain, unspecified: Secondary | ICD-10-CM

## 2016-10-23 LAB — ECHOCARDIOGRAM COMPLETE
HEIGHTINCHES: 68 in
Weight: 2673.74 oz

## 2016-10-23 LAB — LIPID PANEL
Cholesterol: 120 mg/dL (ref 0–200)
HDL: 37 mg/dL — ABNORMAL LOW (ref >40)
LDL CALC: 58 mg/dL (ref 0–99)
TRIGLYCERIDES: 127 mg/dL (ref <150)
Total CHOL/HDL Ratio: 3.2 RATIO
VLDL: 25 mg/dL (ref 0–40)

## 2016-10-23 MED ORDER — AMLODIPINE BESYLATE 5 MG PO TABS
2.5000 mg | ORAL_TABLET | Freq: Once | ORAL | Status: AC
  Administered 2016-10-23: 2.5 mg via ORAL
  Filled 2016-10-23: qty 1

## 2016-10-23 MED ORDER — METOPROLOL TARTRATE 5 MG/5ML IV SOLN: 2.5000 mg | INTRAVENOUS | Status: DC | PRN

## 2016-10-23 MED ORDER — AMLODIPINE BESYLATE 5 MG PO TABS
7.5000 mg | ORAL_TABLET | Freq: Every day | ORAL | Status: DC
  Administered 2016-10-24 – 2016-10-26 (×3): 7.5 mg via ORAL
  Filled 2016-10-23 (×5): qty 2

## 2016-10-23 NOTE — Progress Notes (Signed)
  Echocardiogram 2D Echocardiogram has been performed.  Tresa Res 10/23/2016, 11:02 AM

## 2016-10-23 NOTE — Progress Notes (Signed)
Assessment Active Problems:   Colovesical fistula s/p lap assisted sigmoid colectomy and colostomy 10/19/16-bowel function returning.   Substernal chest pain/pressure-still having some episodes; appreciate Cardiology assistance; waiting on ECHO results    HTN-per Cardiology     Plan:  Decrease IVF.  Advance to full liquids.   LOS: 4 days     4 Days Post-Op  Subjective: Still with some intermittent substernal chest pressure.  Husband in room. No nausea currently. Objective: Vital signs in last 24 hours: Temp:  [97.9 F (36.6 C)-98.9 F (37.2 C)] 98.7 F (37.1 C) (11/14 0414) Pulse Rate:  [65-87] 71 (11/14 1000) Resp:  [15-25] 23 (11/14 1000) BP: (134-177)/(54-108) 168/108 (11/14 1000) SpO2:  [90 %-98 %] 96 % (11/14 1000) Last BM Date: 10/22/16  Intake/Output from previous day: 11/13 0701 - 11/14 0700 In: 3495.4 [P.O.:1500; I.V.:1845.4; IV Piggyback:150] Out: 1096 [Urine:875; Drains:71; Stool:150] Intake/Output this shift: Total I/O In: 225 [I.V.:225] Out: 115 [Urine:100; Drains:15]  PE: General- She looks good and is in NAD  Abdomen-soft, incisions are clean and intact, mild distension, more bowel sounds today, serosanguinous drain output, colostomy edematous with green liquid stool and gas output  Lab Results:   Recent Labs  10/22/16 0807  WBC 9.2  HGB 10.2*  HCT 30.5*  PLT 277   BMET  Recent Labs  10/22/16 0807  NA 140  K 3.5  CL 114*  CO2 22  GLUCOSE 119*  BUN 12  CREATININE 0.76  CALCIUM 8.1*   PT/INR No results for input(s): LABPROT, INR in the last 72 hours. Comprehensive Metabolic Panel:    Component Value Date/Time   NA 140 10/22/2016 0807   NA 136 10/20/2016 0315   K 3.5 10/22/2016 0807   K 3.4 (L) 10/20/2016 0315   CL 114 (H) 10/22/2016 0807   CL 104 10/20/2016 0315   CO2 22 10/22/2016 0807   CO2 23 10/20/2016 0315   BUN 12 10/22/2016 0807   BUN 23 (H) 10/20/2016 0315   CREATININE 0.76 10/22/2016 0807   CREATININE 1.21 (H)  10/20/2016 0315   GLUCOSE 119 (H) 10/22/2016 0807   GLUCOSE 188 (H) 10/20/2016 0315   CALCIUM 8.1 (L) 10/22/2016 0807   CALCIUM 8.0 (L) 10/20/2016 0315   AST 18 10/16/2016 1436   AST 14 04/30/2016 1549   ALT 14 10/16/2016 1436   ALT 15 04/30/2016 1549   ALKPHOS 72 10/16/2016 1436   ALKPHOS 95 04/30/2016 1549   BILITOT 0.7 10/16/2016 1436   BILITOT 0.4 04/30/2016 1549   PROT 7.8 10/16/2016 1436   PROT 6.9 04/30/2016 1549   ALBUMIN 4.3 10/16/2016 1436   ALBUMIN 3.7 04/30/2016 1549     Studies/Results: No results found.  Anti-infectives: Anti-infectives    Start     Dose/Rate Route Frequency Ordered Stop   10/19/16 2200  cefoTEtan (CEFOTAN) 2 g in dextrose 5 % 50 mL IVPB     2 g 100 mL/hr over 30 Minutes Intravenous Every 12 hours 10/19/16 1638 10/19/16 2302   10/19/16 0611  cefoTEtan (CEFOTAN) 2 g in dextrose 5 % 50 mL IVPB     2 g 100 mL/hr over 30 Minutes Intravenous On call to O.R. 10/19/16 0611 10/19/16 0759       Idil Maslanka J 10/23/2016

## 2016-10-23 NOTE — Progress Notes (Signed)
Patient Name: Toni [name] Date of Encounter: 10/23/2016  Primary Cardiologist: Dr [facility] Problem List     Active Problems:   Colovesical fistula   S/P laparoscopic colectomy     Subjective   Had chest pain earlier today, relieved by SL NTG and NTG paste. Currently feels poorly, not able to give a definite reason why.   Inpatient Medications    Scheduled Meds: . amLODipine  5 mg Oral Daily  . aspirin  325 mg Oral Daily  . atorvastatin  20 mg Oral q1800  . cycloSPORINE  1 drop Both Eyes BID  . donepezil  10 mg Oral QHS  . enoxaparin (LOVENOX) injection  40 mg Subcutaneous Q24H  . famotidine (PEPCID) IV  20 mg Intravenous Q24H  . metoprolol tartrate  50 mg Oral BID  . nitroGLYCERIN  1 inch Topical Q6H  . sertraline  100 mg Oral Daily   Continuous Infusions: . dextrose 5 % and 0.9 % NaCl with KCl 20 mEq/L 75 mL/hr at 10/23/16 0600   PRN Meds: HYDROmorphone (DILAUDID) injection, nitroGLYCERIN, ondansetron **OR** ondansetron (ZOFRAN) IV   Vital Signs    Vitals:   10/23/16 0414 10/23/16 0500 10/23/16 0600 10/23/16 0800  BP:  (!) 158/63 (!) 144/59 (!) 158/66  Pulse:  69 68   Resp:  (!) 22 17   Temp: 98.7 F (37.1 C)     TempSrc: Oral     SpO2:  96% 95%   Weight:      Height:        Intake/Output Summary (Last 24 hours) at 10/23/16 0809 Last data filed at 10/23/16 0600  Gross per 24 hour  Intake             2960 ml  Output             1096 ml  Net             1864 ml   Filed Weights   10/19/16 0633 10/19/16 1400  Weight: 157 lb (71.2 kg) 167 lb 1.7 oz (75.8 kg)    Physical Exam    GEN: Well nourished, well developed, in no acute distress.  HEENT: Grossly normal.  Neck: Supple, minimal JVD, carotid bruits, or masses. Cardiac: RRR, no murmurs, rubs, or gallops. No clubbing, cyanosis, edema.  Radials/DP/PT 2+ and equal bilaterally.  Respiratory:  Respirations regular and unlabored, slightly decreased bases bilaterally. Few rales GI:  Soft, tender, slightly distended, BS decreased but present MS: no deformity or atrophy. Skin: warm and dry, no rash. Neuro:  Strength and sensation are intact. Psych: AAOx3.  Normal affect.  Labs    CBC  Recent Labs  10/22/16 0807  WBC 9.2  HGB 10.2*  HCT 30.5*  MCV 90.8  PLT 99991111   Basic Metabolic Panel  Recent Labs  10/22/16 0807  NA 140  K 3.5  CL 114*  CO2 22  GLUCOSE 119*  BUN 12  CREATININE 0.76  CALCIUM 8.1*   Liver Function Tests No results for input(s): AST, ALT, ALKPHOS, BILITOT, PROT, ALBUMIN in the last 72 hours. No results for input(s): LIPASE, AMYLASE in the last 72 hours. Cardiac Enzymes  Recent Labs  10/21/16 0142  10/21/16 1350 10/21/16 2017 10/22/16 1245  CKTOTAL 191  --   --  152  --   CKMB 2.3  --   --  1.2  --   TROPONINI <0.03  < > 0.04* 0.09* 0.05*  < > = values in this  interval not displayed. Fasting Lipid Panel  Recent Labs  10/23/16 0311  CHOL 120  HDL 37*  LDLCALC 58  TRIG 127  CHOLHDL 3.2     Telemetry    SR, S brady, high 40s while asleep - Personally Reviewed  ECG    N/a - Personally Reviewed  Radiology    No results found.  Cardiac Studies   Echo ordered, not done yet  Patient Profile     82 yo female w/ no prior card hx, hx HTN, FH CAD, EF nl 2016 w/ grade 1 dd. Admitted 11/10 for colovesical fistula repair w/ laparoscopic-assisted sigmoid colectomy with mobilization of splenic flexure, descending colostomy along with expl laparotomy of bladder and Lt ureter with Lt ureteral cath placement. Cards seeing for chest heaviness and elevated troponin on post-op day 3  Assessment & Plan    1. Chest pain, overall more intense than her heaviness at home. Stable angina at home with slight pattern of acceleration postop, likely due to increased adrenergic state and mild anemia. Also with increase in troponin, will add NTG paste.  - Pt firmly requests the least invasive approach. This is reasonable as long as she  does not have prolonged chest discomfort (>15-20 minutes) or ECG changes. - Treat medically for now, check echo. If it shows significant regional wall motion abnormalities or a drop in LVEF, proceed directly to coronary angiography. - If the echo is normal, will schedule for outpatient Lexiscan Myoview. - Meanwhile, increase beta blocker dose and DC hydralazine. Would target a resting heart rate in high 50s-low 60s and SBP 130 or less. Can also increase amlodipine or add long acting oral nitrates to achieve this goal and angina relief. -  - Start statin and ASA. Note LDL 148 in 2015.  2.  Colovesical fistula s/p lap assisted sigmoid colectomy and colostomy 10/19/16-having some liquid stool and gas per colostomy per surgery  3.  HTN - see med changes above - however, not sure she is absorbing meds - will add metoprolol 2.5 mg IV prn and give now. - if not able to take po metoprolol, increase IV dose and may need IV Hydralazine  4. CKD3 though now cr improved.   Signed, Rosaria Ferries, PA-C  10/23/2016, 8:09 AM   I have seen and examined the patient along with Barrett, Suanne Marker, PA-C .  I have reviewed the chart, notes and new data.  I agree with PA's note.  Key new complaints: no clear angina Key examination changes: no arrhythmia and no signs of HF Key new findings / data: waiting for echo (just done, but images not downloaded yet).  PLAN: In a good mood now, feels better. BP high while receiving a friend's visit. A little bit of disorientation, believes she ran out of the hospital twice. Average HR is slower, but BP high. Increase amlodipine.  Would avoid hydralazine due to reflex tachycardia which may precipitate angina, prefer IV labetalol if IV meds are necessary for BP control.  Continue NTG topically and beta blocker.  Sanda Klein, MD, [place] [phone] 10/23/2016, 10:10 AM

## 2016-10-24 LAB — BASIC METABOLIC PANEL
Anion gap: 4 — ABNORMAL LOW (ref 5–15)
BUN: 10 mg/dL (ref 6–20)
CO2: 22 mmol/L (ref 22–32)
Calcium: 7.9 mg/dL — ABNORMAL LOW (ref 8.9–10.3)
Chloride: 111 mmol/L (ref 101–111)
Creatinine, Ser: 0.73 mg/dL (ref 0.44–1.00)
GFR calc Af Amer: >60 mL/min (ref >60)
GFR calc non Af Amer: >60 mL/min (ref >60)
Glucose, Bld: 117 mg/dL — ABNORMAL HIGH (ref 65–99)
Potassium: 4 mmol/L (ref 3.5–5.1)
Sodium: 137 mmol/L (ref 135–145)

## 2016-10-24 LAB — CBC
HCT: 28.2 % — ABNORMAL LOW (ref 36.0–46.0)
Hemoglobin: 9.6 g/dL — ABNORMAL LOW (ref 12.0–15.0)
MCH: 30.7 pg (ref 26.0–34.0)
MCHC: 34 g/dL (ref 30.0–36.0)
MCV: 90.1 fL (ref 78.0–100.0)
Platelets: 306 10*3/uL (ref 150–400)
RBC: 3.13 MIL/uL — ABNORMAL LOW (ref 3.87–5.11)
RDW: 13.6 % (ref 11.5–15.5)
WBC: 8 10*3/uL (ref 4.0–10.5)

## 2016-10-24 MED ORDER — ISOSORBIDE MONONITRATE ER 30 MG PO TB24
30.0000 mg | ORAL_TABLET | Freq: Every day | ORAL | Status: DC
  Administered 2016-10-24 – 2016-10-26 (×3): 30 mg via ORAL
  Filled 2016-10-24 (×3): qty 1

## 2016-10-24 MED ORDER — FAMOTIDINE 20 MG PO TABS
20.0000 mg | ORAL_TABLET | Freq: Every day | ORAL | Status: DC
  Administered 2016-10-24 – 2016-10-26 (×3): 20 mg via ORAL
  Filled 2016-10-24 (×3): qty 1

## 2016-10-24 MED ORDER — HYDROCODONE-ACETAMINOPHEN 5-325 MG PO TABS
1.0000 | ORAL_TABLET | ORAL | Status: DC | PRN
  Administered 2016-10-24 – 2016-10-26 (×6): 1 via ORAL
  Filled 2016-10-24 (×6): qty 1

## 2016-10-24 MED ORDER — LOSARTAN POTASSIUM 50 MG PO TABS
100.0000 mg | ORAL_TABLET | Freq: Every day | ORAL | Status: DC
  Administered 2016-10-24 – 2016-10-26 (×3): 100 mg via ORAL
  Filled 2016-10-24 (×3): qty 2

## 2016-10-24 NOTE — Progress Notes (Signed)
PHARMACIST - PHYSICIAN COMMUNICATION DR:   Zella Richer CONCERNING: IV to Oral Route Change Policy  RECOMMENDATION: This patient is receiving Famotidine by the intravenous route.  Based on criteria approved by the Pharmacy and Therapeutics Committee, the intravenous medication(s) is/are being converted to the equivalent oral dose form(s).  DESCRIPTION: These criteria include:  The patient is eating (Full liquid diet, now advanced to soft diet) and/or has been taking other orally administered medications for a least 24 hours  The patient has no evidence of active gastrointestinal bleeding or impaired GI absorption (gastrectomy, short bowel, patient on TNA or NPO).  If you have questions about this conversion, please contact the Pharmacy Department  []   [phone] )  [facility] PharmD, [place] Pager [phone] 10/24/2016 8:57 AM

## 2016-10-24 NOTE — Progress Notes (Signed)
Patient Name: Toni [name] Date of Encounter: 10/24/2016  Primary Cardiologist: [facility] Problem List     Active Problems:   Essential hypertension   Colovesical fistula   S/P laparoscopic colectomy   Accelerating angina (HCC)     Subjective   No chest pressure today.  Inpatient Medications    Scheduled Meds: . amLODipine  7.5 mg Oral Daily  . aspirin  325 mg Oral Daily  . atorvastatin  20 mg Oral q1800  . cycloSPORINE  1 drop Both Eyes BID  . donepezil  10 mg Oral QHS  . enoxaparin (LOVENOX) injection  40 mg Subcutaneous Q24H  . famotidine  20 mg Oral Daily  . metoprolol tartrate  50 mg Oral BID  . nitroGLYCERIN  1 inch Topical Q6H  . sertraline  100 mg Oral Daily   Continuous Infusions: . dextrose 5 % and 0.9 % NaCl with KCl 20 mEq/L 30 mL/hr at 10/24/16 0900   PRN Meds: HYDROmorphone (DILAUDID) injection, metoprolol, nitroGLYCERIN, ondansetron **OR** ondansetron (ZOFRAN) IV   Vital Signs    Vitals:   10/23/16 2310 10/24/16 0000 10/24/16 0400 10/24/16 0800  BP: (!) 187/75 (!) 160/69 (!) 144/56 (!) 151/50  Pulse: 79 80 62 60  Resp: (!) 25 20 20 19   Temp: 98.5 F (36.9 C) 98.4 F (36.9 C) 98.9 F (37.2 C) 98.4 F (36.9 C)  TempSrc: Oral Oral Oral Oral  SpO2: 92% 93% 93% 93%  Weight:      Height:        Intake/Output Summary (Last 24 hours) at 10/24/16 1022 Last data filed at 10/24/16 0900  Gross per 24 hour  Intake          1285.67 ml  Output             1420 ml  Net          -134.33 ml   Filed Weights   10/19/16 0633 10/19/16 1400  Weight: 157 lb (71.2 kg) 167 lb 1.7 oz (75.8 kg)    Physical Exam    GEN: Well nourished,  in no acute distress.  HEENT: normocephalic, sclera clear, mucus membranes moist.  Neck: Supple, no JVD, carotid bruits, or masses. Cardiac: RRR, no murmurs, rubs, or gallops. No clubbing, cyanosis, edema.  Radials/DP/PT 2+ and equal bilaterally.  Respiratory:  Respirations regular and unlabored, clear to  auscultation bilaterally without rales, rhonchi or wheezes. GI: Abd -Soft, nontender, nondistended, BS + x 4. MS: no deformity or atrophy. Skin: warm and dry, brisk capillary refill, no obvious rash Neuro:  Alert and oriented X 3 MAE, follows commands Psych: answers questions appropriately,Normal and pleasant affect.   Labs    CBC  Recent Labs  10/22/16 0807 10/24/16 0311  WBC 9.2 8.0  HGB 10.2* 9.6*  HCT 30.5* 28.2*  MCV 90.8 90.1  PLT 277 [id number]   Basic Metabolic Panel  Recent Labs  10/22/16 0807 10/24/16 0311  NA 140 137  K 3.5 4.0  CL 114* 111  CO2 22 22  GLUCOSE 119* 117*  BUN 12 10  CREATININE 0.76 0.73  CALCIUM 8.1* 7.9*   Liver Function Tests No results for input(s): AST, ALT, ALKPHOS, BILITOT, PROT, ALBUMIN in the last 72 hours. No results for input(s): LIPASE, AMYLASE in the last 72 hours. Cardiac Enzymes  Recent Labs  10/21/16 1350 10/21/16 2017 10/22/16 1245  CKTOTAL  --  152  --   CKMB  --  1.2  --   TROPONINI 0.04*  0.09* 0.05*   BNP Invalid input(s): POCBNP D-Dimer No results for input(s): DDIMER in the last 72 hours. Hemoglobin A1C No results for input(s): HGBA1C in the last 72 hours. Fasting Lipid Panel  Recent Labs  10/23/16 0311  CHOL 120  HDL 37*  LDLCALC 58  TRIG 127  CHOLHDL 3.2   Thyroid Function Tests No results for input(s): TSH, T4TOTAL, T3FREE, THYROIDAB in the last 72 hours.  Invalid input(s): FREET3  Telemetry    SR - Personally Reviewed  ECG    10/21/16 was last, SR and no acute changes.  - Personally Reviewed  Radiology    No results found.  Cardiac Studies   ECHO 10/23/16  Study Conclusions  - Left ventricle: The cavity size was normal. There was mild focal   basal hypertrophy of the septum. Systolic function was normal.   The estimated ejection fraction was in the range of 55% to 60%.   Wall motion was normal; there were no regional wall motion   abnormalities. Doppler parameters are consistent  with abnormal   left ventricular relaxation (grade 1 diastolic dysfunction). - Mitral valve: There was mild regurgitation.  Impressions:  - Normal LV systolic function; grade 1 diastolic dysfunction; mild   MR.  Patient Profile     82 yo female w/ no prior card hx, hx HTN, FH CAD, EF nl 2016 w/ grade 1 dd. Admitted 11/10 for colovesical fistula repair w/ laparoscopic-assisted sigmoid colectomy with mobilization of splenic flexure, descending colostomy along with expl laparotomy of bladder and Lt ureter with Lt ureteral cath placement. Cards seeing for chest heaviness and elevated troponin on post-op day 3   Assessment & Plan    1. Chest pain, overall more intense than her heaviness at home. Stable angina at home with slight pattern of acceleration postop, likely due to increased adrenergic state and mild anemia.Also with increase in troponin, on NTG paste. - Pt firmly requests the least invasive approach. This is reasonable as long as she does not have prolonged chest discomfort (>15-20 minutes) or ECG changes. - Treat medically for now, check echo.normal EF- out thought had been if abnormal then proceed directly to coronary angiography. - Since the echo is normal, will schedule for outpatient Lexiscan Myoview. - Her beta blocker dose has been increased and DC'd hydralazine. Would target a resting heart rate in high 50s-low 60s and SBP 130 or less. Can also increase amlodipine or add long acting oral nitrates to achieve this goal and angina relief. -  - Start statin and ASA. Note LDL 148 in 2015.  2. Colovesical fistula s/p lap assisted sigmoid colectomy and colostomy 10/19/16-having some liquid stool and gas per colostomy per surgery  3. HTN - see med changes above - however, not sure she is absorbing meds - will add metoprolol 2.5 mg IV prn and give now. - if not able to take po metoprolol, increase IV dose and may need IV Hydralazine  4. CKD3 though now cr improved.  ?  Resume ARB she was on as outpt. Cozaar 100, would hold off HCTZ  Signed, Cecilie Kicks, NP  10/24/2016, 10:22 AM  [place] Pager [phone]  After 5 or weekends [phone]  I have seen and examined the patient along with Cecilie Kicks, NP.  I have reviewed the chart, notes and new data.  I agree with NP's note.  Key new complaints: no angina Key examination changes: no clinical signs of CHF or arrhythmia. BP high Key new findings /  data: No significant abnormalities on echo. normal creat.  PLAN:  Restart her home dose of losartan. Switch nitrates to PO. No additional inpatient cardiac workup planned. Outpatient [facility] after surgical recovery. ASA and statin to be continued at DC. PT eval prior to DC.  Sanda Klein, MD, [place] [phone] 10/24/2016, 1:01 PM

## 2016-10-24 NOTE — Progress Notes (Signed)
Assessment Active Problems:   Colovesical fistula s/p lap assisted sigmoid colectomy and colostomy 10/19/16-bowel function is good.  Tolerating full liquids   Substernal chest pain/pressure- appreciate Cardiology assistance; waiting on ECHO results    HTN-per Cardiology    ABL anemia     Plan:  Soft diet.  PT/OT consults.  Transfer to telemetry.   LOS: 5 days     5 Days Post-Op  Subjective: Feels depressed. Objective: Vital signs in last 24 hours: Temp:  [98.4 F (36.9 C)-98.9 F (37.2 C)] 98.9 F (37.2 C) (11/15 0400) Pulse Rate:  [62-82] 62 (11/15 0400) Resp:  [18-27] 20 (11/15 0400) BP: (144-187)/(56-108) 144/56 (11/15 0400) SpO2:  [90 %-96 %] 93 % (11/15 0400) Last BM Date: 10/22/16  Intake/Output from previous day: 11/14 0701 - 11/15 0700 In: [id number] [I.V.:1325; IV Piggyback:50] Out: [id number] [Urine:1450; Drains:85] Intake/Output this shift: No intake/output data recorded.  PE: General- She looks good and is in NAD  Abdomen-soft, incisions are clean and intact, mild distension,active bowel sounds, serosanguinous drain output, colostomy edematous with green liquid stool and gas output  Lab Results:   Recent Labs  10/22/16 0807 10/24/16 0311  WBC 9.2 8.0  HGB 10.2* 9.6*  HCT 30.5* 28.2*  PLT 277 306   BMET  Recent Labs  10/22/16 0807 10/24/16 0311  NA 140 137  K 3.5 4.0  CL 114* 111  CO2 22 22  GLUCOSE 119* 117*  BUN 12 10  CREATININE 0.76 0.73  CALCIUM 8.1* 7.9*   PT/INR No results for input(s): LABPROT, INR in the last 72 hours. Comprehensive Metabolic Panel:    Component Value Date/Time   NA 137 10/24/2016 0311   NA 140 10/22/2016 0807   K 4.0 10/24/2016 0311   K 3.5 10/22/2016 0807   CL 111 10/24/2016 0311   CL 114 (H) 10/22/2016 0807   CO2 22 10/24/2016 0311   CO2 22 10/22/2016 0807   BUN 10 10/24/2016 0311   BUN 12 10/22/2016 0807   CREATININE 0.73 10/24/2016 0311   CREATININE 0.76 10/22/2016 0807   GLUCOSE 117 (H) 10/24/2016  0311   GLUCOSE 119 (H) 10/22/2016 0807   CALCIUM 7.9 (L) 10/24/2016 0311   CALCIUM 8.1 (L) 10/22/2016 0807   AST 18 10/16/2016 1436   AST 14 04/30/2016 1549   ALT 14 10/16/2016 1436   ALT 15 04/30/2016 1549   ALKPHOS 72 10/16/2016 1436   ALKPHOS 95 04/30/2016 1549   BILITOT 0.7 10/16/2016 1436   BILITOT 0.4 04/30/2016 1549   PROT 7.8 10/16/2016 1436   PROT 6.9 04/30/2016 1549   ALBUMIN 4.3 10/16/2016 1436   ALBUMIN 3.7 04/30/2016 1549     Studies/Results: No results found.  Anti-infectives: Anti-infectives    Start     Dose/Rate Route Frequency Ordered Stop   10/19/16 2200  cefoTEtan (CEFOTAN) 2 g in dextrose 5 % 50 mL IVPB     2 g 100 mL/hr over 30 Minutes Intravenous Every 12 hours 10/19/16 1638 10/19/16 2302   10/19/16 0611  cefoTEtan (CEFOTAN) 2 g in dextrose 5 % 50 mL IVPB     2 g 100 mL/hr over 30 Minutes Intravenous On call to O.R. 10/19/16 0611 10/19/16 0759       Toni [name] J 10/24/2016

## 2016-10-25 ENCOUNTER — Other Ambulatory Visit: Payer: Self-pay | Admitting: Cardiology

## 2016-10-25 DIAGNOSIS — I214 Non-ST elevation (NSTEMI) myocardial infarction: Secondary | ICD-10-CM

## 2016-10-25 DIAGNOSIS — I10 Essential (primary) hypertension: Secondary | ICD-10-CM

## 2016-10-25 MED ORDER — ASPIRIN 81 MG PO CHEW
81.0000 mg | CHEWABLE_TABLET | Freq: Every day | ORAL | Status: DC
  Administered 2016-10-25 – 2016-10-26 (×2): 81 mg via ORAL
  Filled 2016-10-25 (×2): qty 1

## 2016-10-25 NOTE — Evaluation (Signed)
Physical Therapy Evaluation Patient Details Name: Toni [name] MRN: [id number] DOB: Jan 08, 1934 Today's Date: 10/25/2016   History of Present Illness  82 yo female w/ no prior card hx, hx HTN, FH CAD, EF nl 2016 w/ grade 1 dd. Admitted 11/10 for colovesical fistula repair w/ laparoscopic-assisted sigmoid colectomy with mobilization of splenic flexure, descending colostomy along with expl laparotomy of bladder and Lt ureter with Lt ureteral cath placement  Clinical Impression  Pt admitted with above diagnosis. Pt currently with functional limitations due to the deficits listed below (see PT Problem List).  Pt will benefit from skilled PT to increase their independence and safety with mobility to allow discharge to the venue listed below.  Pt typically independent at home and requiring a little assist for mobility at this time.  Pt agreeable to ST-SNF to improve independence and mobility prior to returning home.     Follow Up Recommendations SNF    Equipment Recommendations  Rolling walker with 5" wheels    Recommendations for Other Services       Precautions / Restrictions Precautions Precautions: Fall Precaution Comments: R JP drain, colostomy      Mobility  Bed Mobility Overal bed mobility: Needs Assistance Bed Mobility: Supine to Sit     Supine to sit: Min assist;HOB elevated     General bed mobility comments: assist for trunk upright  Transfers Overall transfer level: Needs assistance Equipment used: Rolling walker (2 wheeled) Transfers: Sit to/from Stand Sit to Stand: Min assist         General transfer comment: assist to rise and steady  Ambulation/Gait Ambulation/Gait assistance: Min assist Ambulation Distance (Feet): 160 Feet Assistive device: Rolling walker (2 wheeled) Gait Pattern/deviations: Step-through pattern;Trunk flexed;Decreased stride length     General Gait Details: verbal cues for safe use of RW, pt requires UE support, cues for  posture, distance to tolerance  Stairs            Wheelchair Mobility    Modified Rankin (Stroke Patients Only)       Balance                                             Pertinent Vitals/Pain Pain Assessment: Faces Faces Pain Scale: Hurts little more Pain Location: abdomen Pain Descriptors / Indicators: Guarding;Grimacing Pain Intervention(s): Monitored during session;Repositioned;Limited activity within patient's tolerance    Home Living Family/patient expects to be discharged to:: Private residence Living Arrangements: Spouse/significant other   Type of Home: House Home Access: Stairs to enter   Technical brewer of Steps: 4 Home Layout: One level Home Equipment: None      Prior Function Level of Independence: Independent               Hand Dominance        Extremity/Trunk Assessment               Lower Extremity Assessment: Generalized weakness         Communication   Communication: No difficulties  Cognition Arousal/Alertness: Awake/alert Behavior During Therapy: WFL for tasks assessed/performed Overall Cognitive Status: Within Functional Limits for tasks assessed                      General Comments      Exercises     Assessment/Plan    PT Assessment Patient needs continued PT services  PT Problem List Decreased strength;Decreased activity tolerance;Decreased mobility;Decreased knowledge of use of DME;Pain          PT Treatment Interventions DME instruction;Gait training;Functional mobility training;Therapeutic exercise;Therapeutic activities;Patient/family education    PT Goals (Current goals can be found in the Care Plan section)  Acute Rehab PT Goals PT Goal Formulation: With patient/family Time For Goal Achievement: 11/01/16 Potential to Achieve Goals: Good    Frequency Min 3X/week   Barriers to discharge        Co-evaluation               End of Session Equipment  Utilized During Treatment: Gait belt Activity Tolerance: Patient tolerated treatment well Patient left: in chair;with call bell/phone within reach;with chair alarm set;with family/visitor present Nurse Communication: Mobility status         Time: [id number] PT Time Calculation (min) (ACUTE ONLY): 21 min   Charges:   PT Evaluation $PT Eval Moderate Complexity: 1 Procedure     PT G Codes:        Delwyn Scoggin,KATHrine E 10/25/2016, 12:17 PM Carmelia Bake, PT, DPT 10/25/2016 Pager: [phone]

## 2016-10-25 NOTE — Consult Note (Signed)
   [facility] CM Inpatient Consult   10/25/2016  Toni [name] May 23, 1934 [id number]     Patient screened for potential [facility] Care Management services. Chart reviewed. Noted current discharge plan is for  SNF. Confirmed with inpatient CSW.  There are no identifiable [facility] Care Management needs at this time. If patient's post hospital needs change, please place a [facility] Care Management consult. For questions please contact:  Marthenia Rolling, [place], RN,BSN [facility] Liaison [phone]

## 2016-10-25 NOTE — Clinical Social Work Placement (Signed)
Patient has a bed at [place] [facility]. CSW has completed FL2 & will continue to follow and assist with discharge when ready.    Raynaldo Opitz, [facility] Clinical Social Worker cell #: [phone]     CLINICAL SOCIAL WORK PLACEMENT  NOTE  Date:  10/25/2016  Patient Details  Name: Toni [name] MRN: [id number] Date of Birth: 07-10-1934  Clinical Social Work is seeking post-discharge placement for this patient at the [place] level of care (*CSW will initial, date and re-position this form in  chart as items are completed):  Yes   Patient/family provided with [facility] Work Department's list of facilities offering this level of care within the geographic area requested by the patient (or if unable, by the patient's family).  Yes   Patient/family informed of their freedom to choose among providers that offer the needed level of care, that participate in Medicare, Medicaid or managed care program needed by the patient, have an available bed and are willing to accept the patient.  Yes   Patient/family informed of [facility]'s ownership interest in [facility] and [facility], as well as of the fact that they are under no obligation to receive care at these facilities.  PASRR submitted to EDS on 10/25/16     PASRR number received on 10/25/16     Existing PASRR number confirmed on       FL2 transmitted to all facilities in geographic area requested by pt/family on 10/25/16     FL2 transmitted to all facilities within larger geographic area on       Patient informed that his/her managed care company has contracts with or will negotiate with certain facilities, including the following:        Yes   Patient/family informed of bed offers received.  Patient chooses bed at [place] [place]     Physician recommends and patient chooses bed at      Patient to be transferred to [place] on  .  Patient to be transferred to facility by       Patient family notified on   of transfer.  Name of family member notified:        PHYSICIAN       Additional Comment:    _______________________________________________ Standley Brooking, LCSW 10/25/2016, 2:45 PM

## 2016-10-25 NOTE — Progress Notes (Signed)
OT Cancellation Note  Patient Details Name: TRISTINE FUJITANI MRN: [id number] DOB: 1934/07/17   Cancelled Treatment:    Reason Eval/Treat Not Completed: Other (comment).  Pt does not feel up to getting up this afternoon.  She did get up with PT this morning. Will check back in the morning.  Aylla Huffine 10/25/2016, 1:58 PM  Lesle Chris, OTR/L [phone] 10/25/2016

## 2016-10-25 NOTE — Consult Note (Addendum)
[place] Nurse ostomy follow up Stoma type/location: LLQ Colosotmy Stomal assessment/size: 1 and 1/2 inches round, edematous (more proximally than distally) Peristomal assessment: intact, clear Treatment options for stomal/peristomal skin: none Output brown liquid stool Ostomy pouching: 1pc.convex CeraPlus pouching system  Education provided: Extended session for daughter and son today; pouch preparation, including stoma measuring demonstrated.  Pouch removal, application, emptying.  Many questions answered, including those about obtaining supplies, pouch change frequency and pouch emptying frequency.  Secure Start explained. Several more copies of the ostomy teaching booklet are provided so that all in family now have. 4 pouches provided for discharge. Social Worker provided with supply ordering #. Family has my contact information so that they may contact as needed. Enrolled patient in [place] Start Discharge program: Yes [place] nursing team  will remain available to this patient, the nursing, surgical and medical teams.   Thanks, Maudie Flakes, MSN, RN, [place], Arther Abbott  Pager# [phone]

## 2016-10-25 NOTE — Clinical Social Work Note (Signed)
Clinical Social Work Assessment  Patient Details  Name: Toni [name] MRN: [id number] Date of Birth: March 07, 1934  Date of referral:  10/25/16               Reason for consult:  Facility Placement                Permission sought to share information with:  Chartered certified accountant granted to share information::  Yes, Verbal Permission Granted  Name::        Agency::     Relationship::     Contact Information:     Housing/Transportation Living arrangements for the past 2 months:  Single Family Home Source of Information:  Patient, Spouse, Adult Children Patient Interpreter Needed:  None Criminal Activity/Legal Involvement Pertinent to Current Situation/Hospitalization:  No - Comment as needed Significant Relationships:  Adult Children, Spouse Lives with:  Spouse Do you feel safe going back to the place where you live?  No Need for family participation in patient care:  Yes (Comment)  Care giving concerns:  CSW received consult for SNF placement.    Social Worker assessment / plan:  CSW met with patient, husband & daughter at bedside re: discharge planning. Patient is reluctant to agree to SNF, understands the need to go but is not excited about going.   Employment status:  Retired Forensic scientist:  [employer] PT Recommendations:  [place] / Referral to community resources:  [place]  Patient/Family's Response to care:  CSW reviewed SNF list and determined which SNF would be closest to their home - confirmed with Nira Conn at [facility] that they would be able to offer a bed.   Patient/Family's Understanding of and Emotional Response to Diagnosis, Current Treatment, and Prognosis:  Patient is looking forward to go to SNF as it is one step closer to going home, she is glad to be out of ICU and anticipating discharge tomorrow.   Emotional Assessment Appearance:  Appears stated  age Attitude/Demeanor/Rapport:    Affect (typically observed):    Orientation:  Oriented to Self, Oriented to Place, Oriented to  Time, Oriented to Situation Alcohol / Substance use:    Psych involvement (Current and /or in the community):     Discharge Needs  Concerns to be addressed:    Readmission within the last 30 days:    Current discharge risk:    Barriers to Discharge:      Standley Brooking, LCSW 10/25/2016, 2:45 PM

## 2016-10-25 NOTE — NC FL2 (Signed)
[place] LEVEL OF CARE SCREENING TOOL     IDENTIFICATION  Patient Name: Toni [name] Birthdate: [birth year] Sex: female Admission Date (Current Location): 10/19/2016  [facility] and [place] Number:  [place] and Address:  [facility],  [place], [place]      Provider Number: [id number]  Attending Physician Name and Address:  Jackolyn Confer, MD  Relative Name and Phone Number:       Current Level of Care: Hospital Recommended Level of Care: [place] Prior Approval Number:    Date Approved/Denied:   PASRR Number: [id number]  Discharge Plan: SNF    Current Diagnoses: Patient Active Problem List   Diagnosis Date Noted  . Accelerating angina ([place]) 10/23/2016  . S/P laparoscopic colectomy 10/19/2016  . Encounter to establish care with new doctor 09/14/2016  . Colovesical fistula 08/27/2016  . Asymptomatic bacteriuria 04/30/2016  . Shakes 04/30/2016  . Chronic fatigue 03/30/2016  . Mild dementia 03/22/2016  . Hyperlipidemia 03/22/2016  . GERD (gastroesophageal reflux disease) 12/26/2015  . Mild cognitive impairment 09/21/2015  . Depression 09/21/2015  . History of stroke 09/21/2015  . CKD (chronic kidney disease)   . Diarrhea 05/03/2015  . Lesion of right native kidney 04/13/2015  . Sepsis ([place]) 04/05/2015  . Diastolic CHF ([place]) [id number]  . History of Clostridium difficile infection 04/05/2015  . Blood poisoning ([place])   . Acute renal failure ([place])   . Pulmonary edema   . Essential hypertension 03/23/2015  . Abnormal CT of brain 03/04/2015  . Headache 11/09/2013  . Irregular heartbeat 11/09/2013  . Cervicogenic headache 01/22/2012  . Diverticulosis of large intestine 10/28/2009  . DIVERTICULITIS, HX OF 10/28/2009  . FASTING HYPERGLYCEMIA 03/07/2009  . COLONIC POLYPS, HX OF 03/07/2009  . ANXIETY STATE, UNSPECIFIED 07/28/2008  . DRY EYE SYNDROME 03/12/2008  . HYPERLIPIDEMIA  04/21/2007  . HEPATIC CYST 04/21/2007  . MIGRAINES, HX OF 04/21/2007    Orientation RESPIRATION BLADDER Height & Weight     Self, Time, Situation, Place  Normal Incontinent, Indwelling catheter Weight: 167 lb 1.7 oz (75.8 kg) Height:  5\' 8"  (172.7 cm)  BEHAVIORAL SYMPTOMS/MOOD NEUROLOGICAL BOWEL NUTRITION STATUS      Colostomy Diet  AMBULATORY STATUS COMMUNICATION OF NEEDS Skin   Extensive Assist Verbally Surgical wounds (Incision (Closed) 10/19/16 Abdomen)                       Personal Care Assistance Level of Assistance  Bathing, Dressing Bathing Assistance: Limited assistance   Dressing Assistance: Limited assistance     Functional Limitations Info             [place]  PT (By licensed PT), OT (By licensed OT)     PT Frequency: 5 OT Frequency: 5            Contractures      Additional Factors Info  Code Status, Allergies Code Status Info: Fullcode Allergies Info: NKDA           Current Medications (10/25/2016):  This is the current hospital active medication list Current Facility-Administered Medications  Medication Dose Route Frequency Provider Last Rate Last Dose  . amLODipine (NORVASC) tablet 7.5 mg  7.5 mg Oral Daily Mihai Croitoru, MD   7.5 mg at 10/25/16 0952  . aspirin chewable tablet 81 mg  81 mg Oral Daily Jackolyn Confer, MD   81 mg at 10/25/16 0953  . atorvastatin (LIPITOR) tablet 20 mg  20 mg Oral q1800 Mihai Croitoru, MD   20 mg at 10/24/16 1726  . cycloSPORINE (RESTASIS) 0.05 % ophthalmic emulsion 1 drop  1 drop Both Eyes BID Jackolyn Confer, MD   1 drop at 10/25/16 0953  . donepezil (ARICEPT) tablet 10 mg  10 mg Oral QHS Jackolyn Confer, MD   10 mg at 10/24/16 2100  . enoxaparin (LOVENOX) injection 40 mg  40 mg Subcutaneous Q24H Jackolyn Confer, MD   40 mg at 10/25/16 0953  . famotidine (PEPCID) tablet 20 mg  20 mg Oral Daily Randa Spike, RPH   20 mg at 10/25/16 0953  . HYDROcodone-acetaminophen  (NORCO/VICODIN) 5-325 MG per tablet 1 tablet  1 tablet Oral Q4H PRN Jill Alexanders, PA-C   1 tablet at 10/25/16 1007  . HYDROmorphone (DILAUDID) injection 0.25-1 mg  0.25-1 mg Intravenous Q2H PRN Autumn Messing III, MD   1 mg at 10/25/16 [id number]  . isosorbide mononitrate (IMDUR) 24 hr tablet 30 mg  30 mg Oral Daily Mihai Croitoru, MD   30 mg at 10/25/16 0953  . losartan (COZAAR) tablet 100 mg  100 mg Oral Daily Mihai Croitoru, MD   100 mg at 10/25/16 0953  . metoprolol (LOPRESSOR) injection 2.5 mg  2.5 mg Intravenous Q4H PRN Rhonda G Barrett, PA-C      . metoprolol tartrate (LOPRESSOR) tablet 50 mg  50 mg Oral BID Isaiah Serge, NP   50 mg at 10/25/16 0953  . nitroGLYCERIN (NITROSTAT) SL tablet 0.4 mg  0.4 mg Sublingual Q5 min PRN Jackolyn Confer, MD   0.4 mg at 10/23/16 0417  . ondansetron (ZOFRAN) tablet 4 mg  4 mg Oral Q6H PRN Jackolyn Confer, MD       Or  . ondansetron [facility]) injection 4 mg  4 mg Intravenous Q4H PRN Jackolyn Confer, MD   4 mg at 10/20/16 0239  . sertraline (ZOLOFT) tablet 100 mg  100 mg Oral Daily Jackolyn Confer, MD   100 mg at 10/25/16 [id number]     Discharge Medications: Please see discharge summary for a list of discharge medications.  Relevant Imaging Results:  Relevant Lab Results:   Additional Information SSN: [id number]  Standley Brooking, LCSW

## 2016-10-25 NOTE — Care Management Important Message (Signed)
Important Message  Patient Details  Name: ANGELIKA DERMER MRN: [id number] Date of Birth: [birth year]   Medicare Important Message Given:  Yes    Camillo Flaming 10/25/2016, 10:36 AMImportant Message  Patient Details  Name: DAZIE SUHRE MRN: [id number] Date of Birth: 1934/08/03   Medicare Important Message Given:  Yes    Camillo Flaming 10/25/2016, 10:36 AM

## 2016-10-25 NOTE — Progress Notes (Addendum)
Assessment Active Problems:   Colovesical fistula s/p lap assisted sigmoid colectomy and colostomy 10/19/16-bowel function is good.  Tolerating soft diet.   Substernal chest pain/pressure- appreciate Cardiology assistance; outpatient stress test planned   HTN-per Cardiology    ABL anemia     Plan:  Wait on results of PT/OT consults.  May need rehab/SNF.  Heplock IV  Restart ASA..   LOS: 6 days     6 Days Post-Op  Subjective: Pain well-controlled with Norco.  Daughter in room. Objective: Vital signs in last 24 hours: Temp:  [98 F (36.7 C)-98.4 F (36.9 C)] 98.3 F (36.8 C) (11/15 2103) Pulse Rate:  [57-73] 66 (11/15 2103) Resp:  [14-23] 14 (11/15 2103) BP: (151-158)/(50-71) 158/59 (11/15 2103) SpO2:  [93 %-97 %] 97 % (11/15 2103) Last BM Date: 10/24/16  Intake/Output from previous day: 11/15 0701 - 11/16 0700 In: 1123.2 [P.O.:360; I.V.:763.2] Out: 1350 [Urine:1100; Drains:50; Stool:200] Intake/Output this shift: Total I/O In: -  Out: 1100 [Urine:1100]  PE: General- She looks good and is in NAD  Abdomen-soft, incisions are clean and intact, mild distension,active bowel sounds, minimal serosanguinous drain output, colostomy edematous with green liquid stool and gas output  Lab Results:   Recent Labs  10/22/16 0807 10/24/16 0311  WBC 9.2 8.0  HGB 10.2* 9.6*  HCT 30.5* 28.2*  PLT 277 306   BMET  Recent Labs  10/22/16 0807 10/24/16 0311  NA 140 137  K 3.5 4.0  CL 114* 111  CO2 22 22  GLUCOSE 119* 117*  BUN 12 10  CREATININE 0.76 0.73  CALCIUM 8.1* 7.9*   PT/INR No results for input(s): LABPROT, INR in the last 72 hours. Comprehensive Metabolic Panel:    Component Value Date/Time   NA 137 10/24/2016 0311   NA 140 10/22/2016 0807   K 4.0 10/24/2016 0311   K 3.5 10/22/2016 0807   CL 111 10/24/2016 0311   CL 114 (H) 10/22/2016 0807   CO2 22 10/24/2016 0311   CO2 22 10/22/2016 0807   BUN 10 10/24/2016 0311   BUN 12 10/22/2016 0807   CREATININE 0.73 10/24/2016 0311   CREATININE 0.76 10/22/2016 0807   GLUCOSE 117 (H) 10/24/2016 0311   GLUCOSE 119 (H) 10/22/2016 0807   CALCIUM 7.9 (L) 10/24/2016 0311   CALCIUM 8.1 (L) 10/22/2016 0807   AST 18 10/16/2016 1436   AST 14 04/30/2016 1549   ALT 14 10/16/2016 1436   ALT 15 04/30/2016 1549   ALKPHOS 72 10/16/2016 1436   ALKPHOS 95 04/30/2016 1549   BILITOT 0.7 10/16/2016 1436   BILITOT 0.4 04/30/2016 1549   PROT 7.8 10/16/2016 1436   PROT 6.9 04/30/2016 1549   ALBUMIN 4.3 10/16/2016 1436   ALBUMIN 3.7 04/30/2016 1549     Studies/Results: No results found.  Anti-infectives: Anti-infectives    Start     Dose/Rate Route Frequency Ordered Stop   10/19/16 2200  cefoTEtan (CEFOTAN) 2 g in dextrose 5 % 50 mL IVPB     2 g 100 mL/hr over 30 Minutes Intravenous Every 12 hours 10/19/16 1638 10/19/16 2302   10/19/16 0611  cefoTEtan (CEFOTAN) 2 g in dextrose 5 % 50 mL IVPB     2 g 100 mL/hr over 30 Minutes Intravenous On call to O.R. 10/19/16 0611 10/19/16 0759       Toni [name] J 10/25/2016

## 2016-10-26 DIAGNOSIS — G3184 Mild cognitive impairment, so stated: Secondary | ICD-10-CM | POA: Diagnosis not present

## 2016-10-26 DIAGNOSIS — R279 Unspecified lack of coordination: Secondary | ICD-10-CM | POA: Diagnosis not present

## 2016-10-26 DIAGNOSIS — Z9049 Acquired absence of other specified parts of digestive tract: Secondary | ICD-10-CM | POA: Diagnosis not present

## 2016-10-26 DIAGNOSIS — F4321 Adjustment disorder with depressed mood: Secondary | ICD-10-CM | POA: Diagnosis not present

## 2016-10-26 DIAGNOSIS — I200 Unstable angina: Secondary | ICD-10-CM | POA: Diagnosis not present

## 2016-10-26 DIAGNOSIS — R41841 Cognitive communication deficit: Secondary | ICD-10-CM | POA: Diagnosis not present

## 2016-10-26 DIAGNOSIS — Z48815 Encounter for surgical aftercare following surgery on the digestive system: Secondary | ICD-10-CM | POA: Diagnosis not present

## 2016-10-26 DIAGNOSIS — R630 Anorexia: Secondary | ICD-10-CM | POA: Diagnosis not present

## 2016-10-26 DIAGNOSIS — I10 Essential (primary) hypertension: Secondary | ICD-10-CM | POA: Diagnosis not present

## 2016-10-26 DIAGNOSIS — M6281 Muscle weakness (generalized): Secondary | ICD-10-CM | POA: Diagnosis not present

## 2016-10-26 DIAGNOSIS — I214 Non-ST elevation (NSTEMI) myocardial infarction: Secondary | ICD-10-CM

## 2016-10-26 DIAGNOSIS — R531 Weakness: Secondary | ICD-10-CM | POA: Diagnosis not present

## 2016-10-26 DIAGNOSIS — M25139 Fistula, unspecified wrist: Secondary | ICD-10-CM | POA: Diagnosis not present

## 2016-10-26 DIAGNOSIS — N321 Vesicointestinal fistula: Secondary | ICD-10-CM | POA: Diagnosis not present

## 2016-10-26 DIAGNOSIS — R2681 Unsteadiness on feet: Secondary | ICD-10-CM | POA: Diagnosis not present

## 2016-10-26 DIAGNOSIS — R0602 Shortness of breath: Secondary | ICD-10-CM | POA: Diagnosis not present

## 2016-10-26 DIAGNOSIS — N390 Urinary tract infection, site not specified: Secondary | ICD-10-CM | POA: Diagnosis not present

## 2016-10-26 LAB — CREATININE, SERUM
Creatinine, Ser: 0.96 mg/dL (ref 0.44–1.00)
GFR calc Af Amer: >60 mL/min (ref >60)
GFR, EST NON AFRICAN AMERICAN: 54 mL/min — AB (ref >60)

## 2016-10-26 MED ORDER — ISOSORBIDE MONONITRATE ER 60 MG PO TB24: 60.0000 mg | ORAL_TABLET | Freq: Every day | ORAL | 6 refills | Status: DC

## 2016-10-26 MED ORDER — HYDROCODONE-ACETAMINOPHEN 5-325 MG PO TABS: 1.0000 | ORAL_TABLET | ORAL | 0 refills | Status: DC | PRN

## 2016-10-26 MED ORDER — ATORVASTATIN CALCIUM 20 MG PO TABS: 20.0000 mg | ORAL_TABLET | Freq: Every day | ORAL | 6 refills | Status: DC

## 2016-10-26 MED ORDER — ISOSORBIDE MONONITRATE ER 30 MG PO TB24
30.0000 mg | ORAL_TABLET | Freq: Once | ORAL | Status: AC
  Administered 2016-10-26: 30 mg via ORAL
  Filled 2016-10-26: qty 1

## 2016-10-26 MED ORDER — ISOSORBIDE MONONITRATE ER 30 MG PO TB24: 60.0000 mg | ORAL_TABLET | Freq: Every day | ORAL | Status: DC

## 2016-10-26 MED ORDER — METOPROLOL TARTRATE 50 MG PO TABS: 50.0000 mg | ORAL_TABLET | Freq: Two times a day (BID) | ORAL | 6 refills | Status: DC

## 2016-10-26 MED ORDER — AMLODIPINE BESYLATE 2.5 MG PO TABS: 7.5000 mg | ORAL_TABLET | Freq: Every day | ORAL | 6 refills | Status: DC

## 2016-10-26 MED ORDER — NITROGLYCERIN 0.4 MG SL SUBL: 0.4000 mg | SUBLINGUAL_TABLET | SUBLINGUAL | 1 refills | Status: DC | PRN

## 2016-10-26 NOTE — Progress Notes (Signed)
OT Cancellation Note  Patient Details Name: Toni [name] MRN: [id number] DOB: 03/21/1934   Cancelled Treatment:    Reason Eval/Treat Not Completed: Other (comment) Noted plan for SNF- will defer OT eval to SNF  [facility], Thereasa Parkin 10/26/2016, 8:52 AM

## 2016-10-26 NOTE — Discharge Summary (Signed)
Physician Discharge Summary  Patient ID: Toni [name] MRN: [id number] DOB/AGE: 80-Sep-1935 80 y.o.  Admit date: 10/19/2016 Discharge date: 10/26/2016  Admission Diagnoses:  Colovesical fistula  Discharge Diagnoses:  Active Problems:   Essential hypertension   Colovesical fistula due to diverticular disease status post laparoscopic sigmoid colectomy, colostomy, repair of fistula 10/19/2016.   S/P laparoscopic colectomy   Accelerating angina [facility])   NSTEMI (non-ST elevated myocardial infarction) [facility])   Discharged Condition: good  Hospital Course: She was admitted and underwent the above procedure. A drain was left in the pelvis near the rectal stump as no safe anastomosis could be performed US a colostomy was performed. She also had an indwelling Foley catheter that is to be left in place and will be removed by the urologist as an outpatient. She was initially transferred from the recovery room to the stepdown unit. She began having some accelerated angina and was seen by cardiology. They altered her medications which seemed to help her. She occasionally needed a sublingual nitroglycerin.  An echocardiogram done during her stay demonstrated an estimated ejection fraction of 55-60% with normal wall motion. However, giving her somewhat accelerated angina during her hospital stay, cardiologist wanted to schedule an outpatient stress test in approximately 3-4 weeks.  As for her recovery, she did well on the colorectal pathway. Bowel function began to return to the colostomy and her diet was slowly advanced. Pain control was adequate with Norco. Wounds were healing well. The day of her discharge the drain was removed, her staples were removed, she looked good, the colostomy looked good, discharge instructions were discussed with her family. She'll be discharged to [facility] facility in good condition. Follow-up with urology next week. Follow-up with me in to 3 weeks (I asked her  husband to call make an appointment) days.  Activity she may participate in occupational therapy and physical therapy. No lifting over 10 pounds.  Wound care. Dry dressing to the lower abdominal incision and placed site daily.  Colostomy care. Per colostomy nurse at facility.  Routine Foley catheter care.  Cardiology will be describing her cardiac medications.  Consults: cardiology ([place])   Discharge Exam: Blood pressure 139/60, pulse 63, temperature 98.3 F (36.8 C), temperature source Oral, resp. rate 18, height 5\' 8"  (1.727 m), weight 75.8 kg (167 lb 1.7 oz), SpO2 94 %.   Disposition: 01-Home or Self Care     Medication List    STOP taking these medications   amLODipine 5 MG tablet Commonly known as:  NORVASC   cholestyramine 4 g packet Commonly known as:  QUESTRAN   hydrALAZINE 25 MG tablet Commonly known as:  APRESOLINE   hydrochlorothiazide 25 MG tablet Commonly known as:  HYDRODIURIL   losartan 100 MG tablet Commonly known as:  COZAAR   metoprolol tartrate 25 MG tablet Commonly known as:  LOPRESSOR   psyllium 58.6 % packet Commonly known as:  METAMUCIL     TAKE these medications   aspirin 81 MG tablet Take 81 mg by mouth daily.   Coenzyme Q10 300 MG Caps Take 1 capsule by mouth daily.   cyanocobalamin 500 MCG tablet Take 500 mcg by mouth daily.   cycloSPORINE 0.05 % ophthalmic emulsion Commonly known as:  RESTASIS Place 1 drop into both eyes 2 (two) times daily.   donepezil 10 MG tablet Commonly known as:  ARICEPT Take 1 tablet daily What changed:  how much to take  how to take this  when to take this  additional instructions   HYDROcodone-acetaminophen 5-325 MG tablet Commonly known as:  NORCO/VICODIN Take 1 tablet by mouth every 4 (four) hours as needed for moderate pain.   naproxen sodium 220 MG tablet Commonly known as:  ANAPROX Take 220 mg by mouth daily as needed (pain).   nitroGLYCERIN 0.4 MG SL  tablet Commonly known as:  NITROSTAT Place 1 tablet (0.4 mg total) under the tongue every 5 (five) minutes as needed for chest pain.   OVER THE COUNTER MEDICATION Take 1 capsule by mouth daily. IB Gard for IBS   PROBIOTIC DAILY PO Take 1 tablet by mouth daily.   sertraline 100 MG tablet Commonly known as:  ZOLOFT Take 100 mg by mouth daily.      Follow-up Information    Ena Dawley, MD Follow up on 11/27/2016.   Specialty:  Cardiology Why:  at 10:30 AM with Ellen Henri - her PA Contact information: [address] [place] [address] [phone]           Signed: Odis Hollingshead 10/26/2016, 11:50 AM

## 2016-10-26 NOTE — Progress Notes (Signed)
[facility] called for transport.     Makalyn Lennox, LCSW [facility] Clinical Social Worker cell #: [phone]  

## 2016-10-26 NOTE — Clinical Social Work Placement (Signed)
Patient is set to discharge to [facility] today. Patient, husband & daughter at bedside made aware. Discharge packet given to RN, Hinton Dyer. [facility] will be called for transport once seen by Dr. Sallyanne Kuster.     Raynaldo Opitz, [facility] Clinical Social Worker cell #: [phone]    CLINICAL SOCIAL WORK PLACEMENT  NOTE  Date:  10/26/2016  Patient Details  Name: Toni [name] MRN: [id number] Date of Birth: 19-Apr-1934  Clinical Social Work is seeking post-discharge placement for this patient at the [place] level of care (*CSW will initial, date and re-position this form in  chart as items are completed):  Yes   Patient/family provided with [place] Work Department's list of facilities offering this level of care within the geographic area requested by the patient (or if unable, by the patient's family).  Yes   Patient/family informed of their freedom to choose among providers that offer the needed level of care, that participate in Medicare, Medicaid or managed care program needed by the patient, have an available bed and are willing to accept the patient.  Yes   Patient/family informed of [facility]'s ownership interest in [facility] and [facility], as well as of the fact that they are under no obligation to receive care at these facilities.  PASRR submitted to EDS on 10/25/16     PASRR number received on 10/25/16     Existing PASRR number confirmed on       FL2 transmitted to all facilities in geographic area requested by pt/family on 10/25/16     FL2 transmitted to all facilities within larger geographic area on       Patient informed that his/her managed care company has contracts with or will negotiate with certain facilities, including the following:        Yes   Patient/family informed of bed offers received.  Patient chooses bed at [facility], [place]     Physician recommends and  patient chooses bed at      Patient to be transferred to [facility] on 10/26/16.  Patient to be transferred to facility by [facility]     Patient family notified on 10/26/16 of transfer.  Name of family member notified:  patient's husband & daughter at bedside     PHYSICIAN       Additional Comment:    _______________________________________________ Standley Brooking, LCSW 10/26/2016, 12:16 PM

## 2016-10-26 NOTE — Discharge Instructions (Addendum)
You are scheduled for a heart stress test (on stretcher) November 22, 2016 at 7:30 AM  Nothing to eat or drink after midnight - the office will call to go over your meds prior to that test.   We put it out some to allow you to recover from surgery.   [facility], [place] [phone]  OPEN ABDOMINAL SURGERY: POST OP INSTRUCTIONS  Always review your discharge instruction sheet given to you by the facility where your surgery was performed.  IF YOU HAVE DISABILITY OR FAMILY LEAVE FORMS, YOU MUST BRING THEM TO THE OFFICE FOR PROCESSING.  PLEASE DO NOT GIVE THEM TO YOUR DOCTOR.  1. A prescription for pain medication may be given to you upon discharge.  Take your pain medication as prescribed, if needed.  If narcotic pain medicine is not needed, then you may take acetaminophen (Tylenol) or ibuprofen (Advil) as needed. 2. Take your usually prescribed medications unless otherwise directed. 3. If you need a refill on your pain medication, please contact your pharmacy. They will contact our office to request authorization.  Prescriptions will not be filled after 5pm or on week-ends. 4. You should follow a lowfat diet.  Be sure to include lots of fluids daily. Most patients will experience some swelling and bruising in the area of the incision. Ice pack will help. Swelling and bruising can take several days to resolve..  5. It is common to experience some constipation if taking pain medication after surgery.  Increasing fluid intake and taking a stool softener will usually help or prevent this problem from occurring.  A mild laxative (Milk of Magnesia or Miralax) should be taken according to package directions if there are no bowel movements after 48 hours. 6.  You may have steri-strips (small skin tapes) in place directly over the incision.  These strips should be left on the skin.  If your surgeon used skin glue on the incision, you may shower in 24 hours.  The glue will flake off over the  next 2-3 weeks.  Any sutures or staples will be removed at the office during your follow-up visit. You may find that a light gauze bandage over your incision may keep your staples from being rubbed or pulled. You may shower and replace the bandage daily. 7. ACTIVITIES:  You may resume regular (light) daily activities beginning the next day--such as daily self-care, walking, climbing stairs--gradually increasing activities as tolerated.  You may have sexual intercourse when it is comfortable.  Refrain from any heavy lifting or straining for at least 6 weeks.  Do not lift anything over 10 pounds.  a. You may drive when you no longer are taking prescription pain medication, you can comfortably wear a seatbelt, and you can safely maneuver your car and apply brakes b. Return to Work: _na__________________________________ 8. You should see your doctor in the office for a follow-up appointment approximately 2-3 weeks after your surgery.  Make sure that you call for this appointment within a day or two after you arrive home to insure a convenient appointment time. OTHER INSTRUCTIONS:  _Apply a dry dressing to the lower wound and drain site wound daily.____________________________________________________________ _____________________________________________________________  WHEN TO CALL YOUR DOCTOR: 1. Fever over 101.0 2. Inability to urinate 3. Nausea and/or vomiting 4. Extreme swelling or bruising 5. Continued bleeding from incision. 6. Increased pain, redness, or drainage from the incision.  The clinic staff is available to answer your questions during regular business hours.  Please  dont hesitate to call and ask to speak to one of the nurses if you have concerns.  For further questions, please visit [email or web address]

## 2016-10-26 NOTE — Progress Notes (Signed)
Assessment Active Problems:   Colovesical fistula s/p lap assisted sigmoid colectomy and colostomy 10/19/16-she is progressing well and is ready to leave hospital    Substernal chest pain/pressure- appreciate Cardiology assistance; outpatient stress test planned   HTN-per Cardiology    ABL anemia     Plan:  Discharge to SNF with foley in.  Follow up with Urology next week.  Follow up with me in 2-3 weeks.  LOS: 7 days     7 Days Post-Op  Subjective: She feels good.  Husband and daughter in room. Has a bed at [facility]. Objective: Vital signs in last 24 hours: Temp:  [98.3 F (36.8 C)-98.8 F (37.1 C)] 98.3 F (36.8 C) (11/17 0654) Pulse Rate:  [58-70] 63 (11/17 0722) Resp:  [15-18] 18 (11/17 0722) BP: (136-153)/(56-69) 139/60 (11/17 0722) SpO2:  [94 %-98 %] 94 % (11/17 0722) Last BM Date: 10/25/16  Intake/Output from previous day: 11/16 0701 - 11/17 0700 In: -  Out: 3525 [Urine:3500; Drains:25] Intake/Output this shift: Total I/O In: 120 [P.O.:120] Out: 500 [Urine:400; Stool:100]  PE: General- She looks good and is in NAD  Abdomen-soft, incisions are clean and intact, no distension, minimal serosanguinous drain output (drain removed), colostomy viable with green liquid stool and gas output  Lab Results:   Recent Labs  10/24/16 0311  WBC 8.0  HGB 9.6*  HCT 28.2*  PLT 306   BMET  Recent Labs  10/24/16 0311 10/26/16 0344  NA 137  --   K 4.0  --   CL 111  --   CO2 22  --   GLUCOSE 117*  --   BUN 10  --   CREATININE 0.73 0.96  CALCIUM 7.9*  --    PT/INR No results for input(s): LABPROT, INR in the last 72 hours. Comprehensive Metabolic Panel:    Component Value Date/Time   NA 137 10/24/2016 0311   NA 140 10/22/2016 0807   K 4.0 10/24/2016 0311   K 3.5 10/22/2016 0807   CL 111 10/24/2016 0311   CL 114 (H) 10/22/2016 0807   CO2 22 10/24/2016 0311   CO2 22 10/22/2016 0807   BUN 10 10/24/2016 0311   BUN 12 10/22/2016 0807   CREATININE 0.96  10/26/2016 0344   CREATININE 0.73 10/24/2016 0311   GLUCOSE 117 (H) 10/24/2016 0311   GLUCOSE 119 (H) 10/22/2016 0807   CALCIUM 7.9 (L) 10/24/2016 0311   CALCIUM 8.1 (L) 10/22/2016 0807   AST 18 10/16/2016 1436   AST 14 04/30/2016 1549   ALT 14 10/16/2016 1436   ALT 15 04/30/2016 1549   ALKPHOS 72 10/16/2016 1436   ALKPHOS 95 04/30/2016 1549   BILITOT 0.7 10/16/2016 1436   BILITOT 0.4 04/30/2016 1549   PROT 7.8 10/16/2016 1436   PROT 6.9 04/30/2016 1549   ALBUMIN 4.3 10/16/2016 1436   ALBUMIN 3.7 04/30/2016 1549     Studies/Results: No results found.  Anti-infectives: Anti-infectives    Start     Dose/Rate Route Frequency Ordered Stop   10/19/16 2200  cefoTEtan (CEFOTAN) 2 g in dextrose 5 % 50 mL IVPB     2 g 100 mL/hr over 30 Minutes Intravenous Every 12 hours 10/19/16 1638 10/19/16 2302   10/19/16 0611  cefoTEtan (CEFOTAN) 2 g in dextrose 5 % 50 mL IVPB     2 g 100 mL/hr over 30 Minutes Intravenous On call to O.R. 10/19/16 0611 10/19/16 0759       Jiovani Mccammon J 10/26/2016

## 2016-10-26 NOTE — Progress Notes (Signed)
Patient discharged to skilled nursing facility for rehab. Discharge report called to Anson Crofts, RN. Patient transported via [facility]. Eulas Post, RN

## 2016-10-26 NOTE — Progress Notes (Signed)
Patient with complaints of chest pain. Nitro given x 1. Pain relieved after that. States that she has had chest pain before. NSR with a 1st degree heart block on the monitor.

## 2016-10-26 NOTE — Progress Notes (Signed)
Patient Name: Toni [name] Date of Encounter: 10/26/2016  Primary Cardiologist: Dr. Rhona Raider Problem List     Active Problems:   Essential hypertension   Colovesical fistula   S/P laparoscopic colectomy   Accelerating angina [facility])   NSTEMI (non-ST elevated myocardial infarction) ([place])     Subjective   Bad episode of chest pain this AM "an army boot on my chest"  1 NTG with relief.   Inpatient Medications    Scheduled Meds: . amLODipine  7.5 mg Oral Daily  . aspirin  81 mg Oral Daily  . atorvastatin  20 mg Oral q1800  . cycloSPORINE  1 drop Both Eyes BID  . donepezil  10 mg Oral QHS  . enoxaparin (LOVENOX) injection  40 mg Subcutaneous Q24H  . famotidine  20 mg Oral Daily  . isosorbide mononitrate  30 mg Oral Daily  . losartan  100 mg Oral Daily  . metoprolol tartrate  50 mg Oral BID  . sertraline  100 mg Oral Daily   Continuous Infusions:  PRN Meds: HYDROcodone-acetaminophen, HYDROmorphone (DILAUDID) injection, metoprolol, nitroGLYCERIN, ondansetron **OR** ondansetron (ZOFRAN) IV   Vital Signs    Vitals:   10/25/16 1400 10/25/16 2056 10/26/16 0654 10/26/16 0722  BP: (!) 148/62 136/69 (!) 153/56 139/60  Pulse: 70 66 (!) 58 63  Resp: 15 18 18 18   Temp: 98.8 F (37.1 C) 98.3 F (36.8 C) 98.3 F (36.8 C)   TempSrc: Oral Oral Oral   SpO2: 98% 97% 98% 94%  Weight:      Height:        Intake/Output Summary (Last 24 hours) at 10/26/16 1147 Last data filed at 10/26/16 1020  Gross per 24 hour  Intake              120 ml  Output             2925 ml  Net            -2805 ml   Filed Weights   10/19/16 0633 10/19/16 1400  Weight: 157 lb (71.2 kg) 167 lb 1.7 oz (75.8 kg)    PHYSICAL EXAM: GEN: Well nourished,  in no acute distress. More energy today could sit up in bed with little help HEENT: normocephalic, sclera clear, mucus membranes moist.  Neck: Supple, no JVD Cardiac: RRR, no murmurs, rubs, or gallops. No clubbing, cyanosis, edema.   Radials/DP/PT 2+ and equal bilaterally.  Respiratory:  Respirations regular and unlabored, clear to auscultation bilaterally without rales, rhonchi or wheezes. GI: Abd -Soft, nontender, nondistended, BS + x 4. Colostomy in place MS: no deformity or atrophy. Skin: warm and dry, brisk capillary refill, no obvious rash Neuro:  Alert and oriented currently with poor memory, MAE, follows commands Psych: answers questions appropriately,Normal and pleasant affect.   Labs    CBC  Recent Labs  10/24/16 0311  WBC 8.0  HGB 9.6*  HCT 28.2*  MCV 90.1  PLT [id number]   Basic Metabolic Panel  Recent Labs  10/24/16 0311 10/26/16 0344  NA 137  --   K 4.0  --   CL 111  --   CO2 22  --   GLUCOSE 117*  --   BUN 10  --   CREATININE 0.73 0.96  CALCIUM 7.9*  --    Liver Function Tests No results for input(s): AST, ALT, ALKPHOS, BILITOT, PROT, ALBUMIN in the last 72 hours. No results for input(s): LIPASE, AMYLASE in the last 72 hours. Cardiac  Enzymes No results for input(s): CKTOTAL, CKMB, CKMBINDEX, TROPONINI in the last 72 hours. BNP Invalid input(s): POCBNP D-Dimer No results for input(s): DDIMER in the last 72 hours. Hemoglobin A1C No results for input(s): HGBA1C in the last 72 hours. Fasting Lipid Panel No results for input(s): CHOL, HDL, LDLCALC, TRIG, CHOLHDL, LDLDIRECT in the last 72 hours. Thyroid Function Tests No results for input(s): TSH, T4TOTAL, T3FREE, THYROIDAB in the last 72 hours.  Invalid input(s): FREET3  Telemetry    SR to SB at night 46 - Personally Reviewed  ECG    No new from the 12th - Personally Reviewed  Radiology    No results found.  Cardiac Studies   ECHO 10/23/16 ------------------------------------------------------------------- Study Conclusions  - Left ventricle: The cavity size was normal. There was mild focal   basal hypertrophy of the septum. Systolic function was normal.   The estimated ejection fraction was in the range of 55% to  60%.   Wall motion was normal; there were no regional wall motion   abnormalities. Doppler parameters are consistent with abnormal   left ventricular relaxation (grade 1 diastolic dysfunction). - Mitral valve: There was mild regurgitation.  Impressions:  - Normal LV systolic function; grade 1 diastolic dysfunction; mild   MR.  Patient Profile     82 yo female w/ no prior card hx, hx HTN, FH CAD, EF nl 2016 w/ grade 1 dd. Admitted 11/10 for colovesical fistula repair w/ laparoscopic-assisted sigmoid colectomy with mobilization of splenic flexure, descending colostomy along with expl laparotomy of bladder and Lt ureter with Lt ureteral cath placement. Cards seeing for chest heaviness and elevated troponin on post-op day 3   Assessment & Plan    1. Chest pain, overall more intense than her heaviness at home. Stable angina at home with slight pattern of acceleration postop, likely due to increased adrenergic state and mild anemia.Also with increase in troponin, on NTG paste. - Pt firmly requests the least invasive approach. This is reasonable as long as she does not have prolonged chest discomfort (>15-20 minutes) or ECG changes. - Treat medically for now, check echo.normal EF- out thought had been if abnormal then proceed directly to coronary angiography. - Since the echo is normal, will schedule for outpatient Lexiscan Myoview. - Her beta blocker dose has been increased and DC'd hydralazine. Would target a resting heart rate in high 50s-low 60s and SBP 130 or less. Can also increase amlodipine or add long acting oral nitrates to achieve this goal and angina relief. - will increase imdur today to 60 mg.   - Start statin and ASA. Note LDL 148 in 2015. - to be discharged to [facility] for rehab  2. Colovesical fistula s/p lap assisted sigmoid colectomy and colostomy 10/19/16-having some liquid stool and gas per colostomy per surgery  3. HTN - lopressor, amlodipine cozaar  - HR to  46 at night on BB lopressor 50 mg BID- discussed with Dr. Sallyanne Kuster and will leave as is- no brady with activity.  4. CKD3 though now cr improved.    Signed, Cecilie Kicks, NP  10/26/2016, 11:47 AM  [place] Pager [phone]  After 5 or weekends [phone]   I have seen and examined the patient along with Cecilie Kicks, NP .  I have reviewed the chart, notes and new data.  I agree with NP's note.  Key new complaints: chest discomfort relieved by NTG. This has been occurring now for 6-8 months/ Key examination changes: normal CV  exam Key new findings / data: low risk ECG, enzymes, echo.  PLAN: Continue antianginal/antiHTN meds, ASA, statin and plan nuclear perfusion study as an outpt. Nitrate dose increased. If there is a sizeable area of ischemia, will plan coronary angiography and possible PCI. This plan of action and the possible invasive procedure details/complications have been fully reviewed with the patient, her husband and daughter and they agree with said plan. Differential diagnosis includes esophageal spasm, occurring at rest and relieved by SL NTG. She should seek emergency attention if symptoms last >20-30 minutes, not relieved by NTG.  Sanda Klein, MD, [place] [phone] 10/26/2016, 2:02 PM

## 2016-10-28 DIAGNOSIS — N321 Vesicointestinal fistula: Secondary | ICD-10-CM | POA: Diagnosis not present

## 2016-10-28 DIAGNOSIS — I10 Essential (primary) hypertension: Secondary | ICD-10-CM | POA: Diagnosis not present

## 2016-10-28 DIAGNOSIS — R630 Anorexia: Secondary | ICD-10-CM | POA: Diagnosis not present

## 2016-10-28 DIAGNOSIS — F4321 Adjustment disorder with depressed mood: Secondary | ICD-10-CM | POA: Diagnosis not present

## 2016-10-29 DIAGNOSIS — N321 Vesicointestinal fistula: Secondary | ICD-10-CM | POA: Diagnosis not present

## 2016-11-05 ENCOUNTER — Telehealth: Payer: Self-pay | Admitting: Cardiovascular Disease

## 2016-11-05 NOTE — Telephone Encounter (Signed)
Patient of Dr. Meda Coffee - sent to [facility] to advise.

## 2016-11-05 NOTE — Telephone Encounter (Signed)
Please call,concerning blood pressure medicine.Pt was presribed some blood pressure medicine and she  was not aware of it.

## 2016-11-05 NOTE — Telephone Encounter (Signed)
The pts husband, Toni [name], states that Dr Olevia Perches was the MD taking care of his wife in the hospital.  He states that he was notified by his pharmacy that his wife (the pt) has heart medications to pick up. He is confused as the pt is in [facility] home at this time and they are handling her medications.  He does not know the names of the new heart medications as he has not picked them up. I have advised him to either call or go by his pharmacy, write the names of the new medications down, and to take the names of the medications with him while visiting his wife at [facility] today to compare to the list of medications they have on the pt. He is advised that if the new medications are on her med list at [facility] that these medications were started while she was in the hospital and that if they are not on her med list at [facility] to not pick the medications up and to call us back.  He is aware that I am forwarding this message to Dr Francesca Oman and Dr Coritoru's nurse's as Juluis Rainier.

## 2016-11-07 DIAGNOSIS — Z9049 Acquired absence of other specified parts of digestive tract: Secondary | ICD-10-CM | POA: Diagnosis not present

## 2016-11-07 DIAGNOSIS — N390 Urinary tract infection, site not specified: Secondary | ICD-10-CM | POA: Diagnosis not present

## 2016-11-07 DIAGNOSIS — N321 Vesicointestinal fistula: Secondary | ICD-10-CM | POA: Diagnosis not present

## 2016-11-07 DIAGNOSIS — I10 Essential (primary) hypertension: Secondary | ICD-10-CM | POA: Diagnosis not present

## 2016-11-08 DIAGNOSIS — R2689 Other abnormalities of gait and mobility: Secondary | ICD-10-CM | POA: Diagnosis not present

## 2016-11-08 DIAGNOSIS — Z433 Encounter for attention to colostomy: Secondary | ICD-10-CM | POA: Diagnosis not present

## 2016-11-08 DIAGNOSIS — M6281 Muscle weakness (generalized): Secondary | ICD-10-CM | POA: Diagnosis not present

## 2016-11-08 DIAGNOSIS — I10 Essential (primary) hypertension: Secondary | ICD-10-CM | POA: Diagnosis not present

## 2016-11-12 DIAGNOSIS — M6281 Muscle weakness (generalized): Secondary | ICD-10-CM | POA: Diagnosis not present

## 2016-11-12 DIAGNOSIS — R2689 Other abnormalities of gait and mobility: Secondary | ICD-10-CM | POA: Diagnosis not present

## 2016-11-12 DIAGNOSIS — I10 Essential (primary) hypertension: Secondary | ICD-10-CM | POA: Diagnosis not present

## 2016-11-12 DIAGNOSIS — Z433 Encounter for attention to colostomy: Secondary | ICD-10-CM | POA: Diagnosis not present

## 2016-11-15 DIAGNOSIS — M6281 Muscle weakness (generalized): Secondary | ICD-10-CM | POA: Diagnosis not present

## 2016-11-15 DIAGNOSIS — I10 Essential (primary) hypertension: Secondary | ICD-10-CM | POA: Diagnosis not present

## 2016-11-15 DIAGNOSIS — R2689 Other abnormalities of gait and mobility: Secondary | ICD-10-CM | POA: Diagnosis not present

## 2016-11-15 DIAGNOSIS — Z433 Encounter for attention to colostomy: Secondary | ICD-10-CM | POA: Diagnosis not present

## 2016-11-16 DIAGNOSIS — I10 Essential (primary) hypertension: Secondary | ICD-10-CM | POA: Diagnosis not present

## 2016-11-16 DIAGNOSIS — R2689 Other abnormalities of gait and mobility: Secondary | ICD-10-CM | POA: Diagnosis not present

## 2016-11-16 DIAGNOSIS — M6281 Muscle weakness (generalized): Secondary | ICD-10-CM | POA: Diagnosis not present

## 2016-11-16 DIAGNOSIS — Z433 Encounter for attention to colostomy: Secondary | ICD-10-CM | POA: Diagnosis not present

## 2016-11-19 DIAGNOSIS — R2689 Other abnormalities of gait and mobility: Secondary | ICD-10-CM | POA: Diagnosis not present

## 2016-11-19 DIAGNOSIS — Z433 Encounter for attention to colostomy: Secondary | ICD-10-CM | POA: Diagnosis not present

## 2016-11-19 DIAGNOSIS — I10 Essential (primary) hypertension: Secondary | ICD-10-CM | POA: Diagnosis not present

## 2016-11-19 DIAGNOSIS — M6281 Muscle weakness (generalized): Secondary | ICD-10-CM | POA: Diagnosis not present

## 2016-11-20 ENCOUNTER — Telehealth (HOSPITAL_COMMUNITY): Payer: Self-pay | Admitting: *Deleted

## 2016-11-20 NOTE — Telephone Encounter (Signed)
Patient given detailed instructions per Myocardial Perfusion Study Information Sheet for the test on 11/22/16. Patient notified to arrive 15 minutes early and that it is imperative to arrive on time for appointment to keep from having the test rescheduled.  If you need to cancel or reschedule your appointment, please call the office within 24 hours of your appointment. Failure to do so may result in a cancellation of your appointment, and a $50 no show fee. Patient verbalized understanding. Naoma Boxell Jacqueline    

## 2016-11-22 ENCOUNTER — Ambulatory Visit (HOSPITAL_COMMUNITY): Payer: Medicare Other | Attending: Cardiology

## 2016-11-22 DIAGNOSIS — I214 Non-ST elevation (NSTEMI) myocardial infarction: Secondary | ICD-10-CM

## 2016-11-22 DIAGNOSIS — I10 Essential (primary) hypertension: Secondary | ICD-10-CM

## 2016-11-22 LAB — MYOCARDIAL PERFUSION IMAGING
CHL CUP NUCLEAR SDS: 2
CHL CUP RESTING HR STRESS: 61 {beats}/min
CSEPPHR: 100 {beats}/min
LHR: 0.28
LV dias vol: 65 mL (ref 46–106)
LVSYSVOL: 22 mL
NUC STRESS TID: 0.89
SRS: 0
SSS: 2

## 2016-11-22 MED ORDER — REGADENOSON 0.4 MG/5ML IV SOLN
0.4000 mg | Freq: Once | INTRAVENOUS | Status: AC
  Administered 2016-11-22: 0.4 mg via INTRAVENOUS

## 2016-11-22 MED ORDER — TECHNETIUM TC 99M TETROFOSMIN IV KIT
10.3000 | PACK | Freq: Once | INTRAVENOUS | Status: AC | PRN
  Administered 2016-11-22: 10.3 via INTRAVENOUS
  Filled 2016-11-22: qty 11

## 2016-11-22 MED ORDER — TECHNETIUM TC 99M TETROFOSMIN IV KIT
32.2000 | PACK | Freq: Once | INTRAVENOUS | Status: AC | PRN
  Administered 2016-11-22: 32.2 via INTRAVENOUS
  Filled 2016-11-22: qty 33

## 2016-11-23 ENCOUNTER — Other Ambulatory Visit (INDEPENDENT_AMBULATORY_CARE_PROVIDER_SITE_OTHER): Payer: Medicare Other

## 2016-11-23 ENCOUNTER — Ambulatory Visit (INDEPENDENT_AMBULATORY_CARE_PROVIDER_SITE_OTHER): Payer: Medicare Other | Admitting: Internal Medicine

## 2016-11-23 VITALS — BP 142/72 | HR 82 | Temp 98.0°F | Resp 16 | Ht 67.0 in | Wt 152.0 lb

## 2016-11-23 DIAGNOSIS — N321 Vesicointestinal fistula: Secondary | ICD-10-CM

## 2016-11-23 DIAGNOSIS — D649 Anemia, unspecified: Secondary | ICD-10-CM

## 2016-11-23 DIAGNOSIS — I10 Essential (primary) hypertension: Secondary | ICD-10-CM

## 2016-11-23 DIAGNOSIS — F03A0 Unspecified dementia, mild, without behavioral disturbance, psychotic disturbance, mood disturbance, and anxiety: Secondary | ICD-10-CM

## 2016-11-23 DIAGNOSIS — F0390 Unspecified dementia without behavioral disturbance: Secondary | ICD-10-CM | POA: Diagnosis not present

## 2016-11-23 DIAGNOSIS — R6889 Other general symptoms and signs: Secondary | ICD-10-CM

## 2016-11-23 DIAGNOSIS — I200 Unstable angina: Secondary | ICD-10-CM

## 2016-11-23 DIAGNOSIS — G3184 Mild cognitive impairment, so stated: Secondary | ICD-10-CM

## 2016-11-23 DIAGNOSIS — F3289 Other specified depressive episodes: Secondary | ICD-10-CM

## 2016-11-23 DIAGNOSIS — N189 Chronic kidney disease, unspecified: Secondary | ICD-10-CM

## 2016-11-23 DIAGNOSIS — R634 Abnormal weight loss: Secondary | ICD-10-CM

## 2016-11-23 DIAGNOSIS — R5382 Chronic fatigue, unspecified: Secondary | ICD-10-CM

## 2016-11-23 LAB — CBC WITH DIFFERENTIAL/PLATELET
Basophils Absolute: 0 10*3/uL (ref 0.0–0.1)
Basophils Relative: 0.2 % (ref 0.0–3.0)
EOS PCT: 1.9 % (ref 0.0–5.0)
Eosinophils Absolute: 0.2 10*3/uL (ref 0.0–0.7)
HEMATOCRIT: 36.2 % (ref 36.0–46.0)
Hemoglobin: 12.3 g/dL (ref 12.0–15.0)
LYMPHS ABS: 2.2 10*3/uL (ref 0.7–4.0)
LYMPHS PCT: 17.5 % (ref 12.0–46.0)
MCHC: 33.9 g/dL (ref 30.0–36.0)
MCV: 85.1 fl (ref 78.0–100.0)
MONOS PCT: 4.9 % (ref 3.0–12.0)
Monocytes Absolute: 0.6 10*3/uL (ref 0.1–1.0)
NEUTROS ABS: 9.7 10*3/uL — AB (ref 1.4–7.7)
NEUTROS PCT: 75.5 % (ref 43.0–77.0)
Platelets: 433 10*3/uL — ABNORMAL HIGH (ref 150.0–400.0)
RBC: 4.25 Mil/uL (ref 3.87–5.11)
RDW: 15.7 % — ABNORMAL HIGH (ref 11.5–15.5)
WBC: 12.8 10*3/uL — ABNORMAL HIGH (ref 4.0–10.5)

## 2016-11-23 LAB — COMPREHENSIVE METABOLIC PANEL
ALK PHOS: 93 U/L (ref 39–117)
ALT: 7 U/L (ref 0–35)
AST: 12 U/L (ref 0–37)
Albumin: 3.8 g/dL (ref 3.5–5.2)
BUN: 14 mg/dL (ref 6–23)
CALCIUM: 9.2 mg/dL (ref 8.4–10.5)
CO2: 25 meq/L (ref 19–32)
Chloride: 107 mEq/L (ref 96–112)
Creatinine, Ser: 1.04 mg/dL (ref 0.40–1.20)
GFR: 54.08 mL/min — AB (ref >60.00)
GLUCOSE: 107 mg/dL — AB (ref 70–99)
POTASSIUM: 4.3 meq/L (ref 3.5–5.1)
Sodium: 141 mEq/L (ref 135–145)
Total Bilirubin: 0.4 mg/dL (ref 0.2–1.2)
Total Protein: 7.1 g/dL (ref 6.0–8.3)

## 2016-11-23 LAB — TSH: TSH: 0.99 u[IU]/mL (ref 0.35–4.50)

## 2016-11-23 LAB — FERRITIN: Ferritin: 239.2 ng/mL (ref 10.0–291.0)

## 2016-11-23 LAB — IRON: Iron: 50 ug/dL (ref 42–145)

## 2016-11-23 MED ORDER — MIRTAZAPINE 7.5 MG PO TABS: 7.5000 mg | ORAL_TABLET | Freq: Every day | ORAL | Status: DC

## 2016-11-23 MED ORDER — MIRTAZAPINE 15 MG PO TABS: 15.0000 mg | ORAL_TABLET | Freq: Every day | ORAL | 5 refills | Status: DC

## 2016-11-23 NOTE — Progress Notes (Signed)
Pre visit review using our clinic review tool, if applicable. No additional management support is needed unless otherwise documented below in the visit note. 

## 2016-11-23 NOTE — Patient Instructions (Addendum)
  Test(s) ordered today. Your results will be released to [place] (or called to you) after review, usually within 72hours after test completion. If any changes need to be made, you will be notified at that same time.   Medications reviewed and updated.  Changes include decrease sertraline (zoloft) to 50 mg daily for two weeks. Then stop it.  Increase mirtazapine at night to 15 mg nightly.   Your prescription(s) have been submitted to your pharmacy. Please take as directed and contact our office if you believe you are having problem(s) with the medication(s).  Please followup in 1 month

## 2016-11-23 NOTE — Progress Notes (Signed)
Subjective:    Patient ID: Toni [name], female    DOB: 08-07-1934, 82 y.o.   MRN: [id number]  HPI The patient is here for follow up.  She was hospitalized 10/19/16 - 10/26/16 for laproscopic sigmoid colectomy, colostomy, repair of fistula (10/19/16).  Her colovesical fistula was due to diverticular disease.  The procedure went well and her recovery was as expected.  She had angina after the procedure and was evaluated by cardiology.  They adjusted her medication.  An echo showed an EF of 55-60% with normal wall motion. They wanted to do a stress test after 3-4 weeks as an outpatient.  It was done yesterday and was normal.  She was discharged to SNF and is now at home.  She was discharged with a foley in place which was removed by urology as an outpatient.   Her husband sent me a mychart message yesterday with the following concerns: she is very depressed, speaks of suicide and is somewhat paranoid. I am not sure if all of this is due to her dementia. Please notice her continued weight loss of 30+ pounds in the last several months. I can not get her to eat well very often.  She complains constantly that she is cold even bundled up and the heat in the upper comfort levels. She seems to be angry all the time.   She has the colostomy and is doing well with it.  She is frustrated by the fact that she will likely need it forever.  She has the nurse coming to her house for her colostomy.  She denies abdominal pain.  She has decreased appetite and does not eat much.    She is not exercising much - it is too cold.  She is walking inside the house and is active.   She admits to being depressed.  She knows at times she snaps at her husband.    She denies any chest pain since leaving the hospital.   Medications and allergies reviewed with patient and updated if appropriate.  Patient Active Problem List   Diagnosis Date Noted  . NSTEMI (non-ST elevated myocardial infarction) ([place])  10/26/2016  . Accelerating angina ([place]) 10/23/2016  . S/P laparoscopic colectomy 10/19/2016  . Encounter to establish care with new doctor 09/14/2016  . Colovesical fistula 08/27/2016  . Asymptomatic bacteriuria 04/30/2016  . Shakes 04/30/2016  . Chronic fatigue 03/30/2016  . Mild dementia 03/22/2016  . Hyperlipidemia 03/22/2016  . GERD (gastroesophageal reflux disease) 12/26/2015  . Mild cognitive impairment 09/21/2015  . Depression 09/21/2015  . History of stroke 09/21/2015  . CKD (chronic kidney disease)   . Diarrhea 05/03/2015  . Lesion of right native kidney 04/13/2015  . Sepsis ([place]) 04/05/2015  . Diastolic CHF ([place]) 123456  . History of Clostridium difficile infection 04/05/2015  . Blood poisoning ([place])   . Acute renal failure ([place])   . Pulmonary edema   . Essential hypertension 03/23/2015  . Abnormal CT of brain 03/04/2015  . Headache 11/09/2013  . Irregular heartbeat 11/09/2013  . Cervicogenic headache 01/22/2012  . Diverticulosis of large intestine 10/28/2009  . DIVERTICULITIS, HX OF 10/28/2009  . FASTING HYPERGLYCEMIA 03/07/2009  . COLONIC POLYPS, HX OF 03/07/2009  . ANXIETY STATE, UNSPECIFIED 07/28/2008  . DRY EYE SYNDROME 03/12/2008  . HYPERLIPIDEMIA 04/21/2007  . HEPATIC CYST 04/21/2007  . MIGRAINES, HX OF 04/21/2007    Current Outpatient Prescriptions on File Prior to Visit  Medication Sig Dispense Refill  . amLODipine (NORVASC) 2.5  MG tablet Take 3 tablets (7.5 mg total) by mouth daily. 90 tablet 6  . aspirin 81 MG tablet Take 81 mg by mouth daily.    Marland Kitchen atorvastatin (LIPITOR) 20 MG tablet Take 1 tablet (20 mg total) by mouth daily at 6 PM. 30 tablet 6  . Coenzyme Q10 300 MG CAPS Take 1 capsule by mouth daily.     . cyanocobalamin 500 MCG tablet Take 500 mcg by mouth daily.    . cycloSPORINE (RESTASIS) 0.05 % ophthalmic emulsion Place 1 drop into both eyes 2 (two) times daily.    Marland Kitchen donepezil (ARICEPT) 10 MG tablet Take 1 tablet daily (Patient taking  differently: Take 10 mg by mouth at bedtime. ) 90 tablet 3  . HYDROcodone-acetaminophen (NORCO/VICODIN) 5-325 MG tablet Take 1 tablet by mouth every 4 (four) hours as needed for moderate pain. 30 tablet 0  . isosorbide mononitrate (IMDUR) 60 MG 24 hr tablet Take 1 tablet (60 mg total) by mouth daily. 30 tablet 6  . losartan (COZAAR) 100 MG tablet Take 100 mg by mouth daily.  3  . metoprolol (LOPRESSOR) 50 MG tablet Take 1 tablet (50 mg total) by mouth 2 (two) times daily. 60 tablet 6  . naproxen sodium (ANAPROX) 220 MG tablet Take 220 mg by mouth daily as needed (pain).    . nitroGLYCERIN (NITROSTAT) 0.4 MG SL tablet Place 1 tablet (0.4 mg total) under the tongue every 5 (five) minutes as needed for chest pain. 30 tablet 1  . OVER THE COUNTER MEDICATION Take 1 capsule by mouth daily. IB Gard for IBS    . Probiotic Product (PROBIOTIC DAILY PO) Take 1 tablet by mouth daily.     . sertraline (ZOLOFT) 100 MG tablet Take 100 mg by mouth daily.     No current facility-administered medications on file prior to visit.     Past Medical History:  Diagnosis Date  . Acute kidney injury ([place])   . C. difficile colitis 2016  . Colonic polyp   . Depression   . Diverticulosis   . GERD (gastroesophageal reflux disease)   . Headache    sinus  . Helicobacter pylori gastritis 1987  . Hepatic cyst   . Hyperlipemia   . Hypertension   . Pneumonia    hx of x 2   . PUD (peptic ulcer disease) 1987   with h pylori    Past Surgical History:  Procedure Laterality Date  . ABDOMINAL HYSTERECTOMY     BSO ; endometriosis; ? Appendectomy incidentally  . CHOLECYSTECTOMY  2008  . COLONOSCOPY  2003 & 2012   polyps; Dr Collene Mares  . CYSTOSCOPY  10/19/2016   Procedure: CYSTOSCOPY;  Surgeon: Nickie Retort, MD;  Location: [facility];  Service: Urology;;  . endoscopy gastritis  2008  . FLEXIBLE SIGMOIDOSCOPY N/A 05/31/2016   Procedure: FLEXIBLE SIGMOIDOSCOPY;  Surgeon: Juanita Craver, MD;  Location: [facility] ENDOSCOPY;  Service:  Endoscopy;  Laterality: N/A;  . LAPAROSCOPIC PARTIAL COLECTOMY N/A 10/19/2016   Procedure: LAPAROSCOPIC ASSISTED SIGMIOD COLOECTOMY MOBILIZATION OF SPLEENIC [place];  Surgeon: Jackolyn Confer, MD;  Location: [facility];  Service: General;  Laterality: N/A;  . ROTATOR CUFF REPAIR     right  . TONSILLECTOMY AND ADENOIDECTOMY      Social History   Social History  . Marital status: Married    Spouse name: N/A  . Number of children: 2  . Years of education: N/A   Social History Main Topics  . Smoking status: Former Smoker  Quit date: 12/10/1973  . Smokeless tobacco: Never Used  . Alcohol use 0.0 oz/week     Comment: 2 glasses of wine daily   . Drug use: No  . Sexual activity: Not on file   Other Topics Concern  . Not on file   Social History Narrative  . No narrative on file    Family History  Problem Relation Age of Onset  . Asthma Brother   . Hypertension Father   . Heart attack Father 44  . Leukemia Mother   . Stomach cancer Maternal Aunt   . Breast cancer Sister   . Thyroid disease Sister   . Stroke Neg Hx   . Diabetes Neg Hx     Review of Systems  Constitutional: Positive for appetite change (decreased). Negative for fever.  HENT: Positive for rhinorrhea.   Respiratory: Negative for cough, shortness of breath and wheezing.   Cardiovascular: Negative for chest pain, palpitations and leg swelling.  Gastrointestinal: Negative for abdominal pain, blood in stool and nausea.       No gerd  Endocrine: Positive for cold intolerance. Negative for polydipsia and polyuria.  Genitourinary: Negative for dysuria and hematuria.  Neurological: Positive for dizziness (occ) and headaches. Negative for light-headedness.  Psychiatric/Behavioral: Positive for dysphoric mood. The patient is not nervous/anxious.        Objective:   Vitals:   11/23/16 1306  BP: (!) 142/72  Pulse: 82  Resp: 16  Temp: 98 F (36.7 C)   Filed Weights   11/23/16 1306  Weight: 152 lb  (68.9 kg)   Body mass index is 23.81 kg/m.   Physical Exam    Constitutional: Appears well-developed and well-nourished. No distress.  HENT:  Head: Normocephalic and atraumatic.  Neck: Neck supple. No tracheal deviation present. No thyromegaly present.  No cervical lymphadenopathy Cardiovascular: Normal rate, regular rhythm and normal heart sounds.   No murmur heard. No carotid bruit .  No edema Pulmonary/Chest: Effort normal and breath sounds normal. No respiratory distress. No has no wheezes. No rales.  Abdomen: colostomy in place which is working and without redness about the ostomy, abdomen is soft and non tender, normal BS Skin: Skin is warm and dry. Not diaphoretic.  Psychiatric: Normal mood and affect. Behavior is normal.      Assessment & Plan:    See Problem List for Assessment and Plan of chronic medical problems.

## 2016-11-25 ENCOUNTER — Encounter: Payer: Self-pay | Admitting: Internal Medicine

## 2016-11-25 DIAGNOSIS — R634 Abnormal weight loss: Secondary | ICD-10-CM | POA: Insufficient documentation

## 2016-11-25 NOTE — Assessment & Plan Note (Signed)
Discussed the need for her to eat - decreased appetite may be related to depression Discussed trying boost or ensure Monitor weight - recheck in one month

## 2016-11-25 NOTE — Assessment & Plan Note (Signed)
Check labs - tsh, iron, cbc

## 2016-11-25 NOTE — Assessment & Plan Note (Signed)
Stress test 11/2016 negative for ischemia

## 2016-11-25 NOTE — Assessment & Plan Note (Signed)
cmp today 

## 2016-11-25 NOTE — Assessment & Plan Note (Addendum)
BP Readings from Last 3 Encounters:  11/23/16 (!) 142/72  10/26/16 139/60  10/16/16 (!) 137/57    BP well controlled Current regimen effective and well tolerated Continue current medications at current doses cmp

## 2016-11-25 NOTE — Assessment & Plan Note (Signed)
Has follow up with Dr Delice Lesch next year

## 2016-11-25 NOTE — Assessment & Plan Note (Signed)
Not ideally controlled Mirtazapine added, still on sertraline Not sure how well sertraline is working - will taper off sertraline and increase mirtazapine Follow up in one month

## 2016-11-26 ENCOUNTER — Telehealth: Payer: Self-pay | Admitting: Internal Medicine

## 2016-11-26 DIAGNOSIS — Z433 Encounter for attention to colostomy: Secondary | ICD-10-CM | POA: Diagnosis not present

## 2016-11-26 DIAGNOSIS — R2689 Other abnormalities of gait and mobility: Secondary | ICD-10-CM | POA: Diagnosis not present

## 2016-11-26 DIAGNOSIS — I10 Essential (primary) hypertension: Secondary | ICD-10-CM | POA: Diagnosis not present

## 2016-11-26 DIAGNOSIS — M6281 Muscle weakness (generalized): Secondary | ICD-10-CM | POA: Diagnosis not present

## 2016-11-26 MED ORDER — ATORVASTATIN CALCIUM 20 MG PO TABS: 20.0000 mg | ORAL_TABLET | Freq: Every day | ORAL | 5 refills | Status: DC

## 2016-11-26 NOTE — Telephone Encounter (Addendum)
States that medications were updated and patient is missing lipitor.  Please follow up in regard.  If patient is to continue this medication would like phoned in to [facility] on [address].   Also would like to know if patient could take at bedtime instead of 6pm.

## 2016-11-26 NOTE — Telephone Encounter (Signed)
Yes, renew - can take at bedtime

## 2016-11-26 NOTE — Telephone Encounter (Signed)
RX sent to [facility] 

## 2016-11-26 NOTE — Telephone Encounter (Signed)
Please advise 

## 2016-11-27 ENCOUNTER — Encounter: Payer: Self-pay | Admitting: Cardiology

## 2016-11-27 ENCOUNTER — Ambulatory Visit (INDEPENDENT_AMBULATORY_CARE_PROVIDER_SITE_OTHER): Payer: Medicare Other | Admitting: Cardiology

## 2016-11-27 VITALS — BP 126/64 | HR 76 | Ht 67.0 in | Wt 153.0 lb

## 2016-11-27 DIAGNOSIS — I200 Unstable angina: Secondary | ICD-10-CM

## 2016-11-27 DIAGNOSIS — R0789 Other chest pain: Secondary | ICD-10-CM

## 2016-11-27 NOTE — Progress Notes (Signed)
11/27/2016 Toni [name]   07/03/1934  [id number]  Primary Physician Toni Rail, MD Primary Cardiologist: Dr. Meda [name]    Reason for Visit/CC: Chest Pain/ post hospital f/u   HPI: Toni [name] presents to clinic for post hospital f/u. She is a 82 yo female w/ no prior card hx, hx HTN, FH CAD, EF nl 2016 w/ grade 1 dd. Admitted 11/10 for colovesical fistula repair w/ laparoscopic-assisted sigmoid colectomy with mobilization of splenic flexure, descending colostomy along with expl laparotomy of bladder and Lt ureter with Lt ureteral cath placement. Cardiology was consulted for chest heaviness and elevated troponin on post-op day 3. Troponin peaked at 0.09. An echocardiogram done during her stay demonstrated an estimated ejection fraction of 55-60% with normal wall motion.  She was seen and evaluated by our practice. Recommendations were to continue  Antianginal/ HTN meds and to to plan for an outpatient NST to r/o ischemia.   Her stress test was completed on 11/22/16. This showed normal perfusion. EF was normal at 67%.   She presents back to clinic for f/u. She is here with her husband. She state she has done well. No recurrent CP. No dyspnea. No exertional symptoms. In retrospect, she believes it was indigestion/ GERD. BP and HR are both well controlled. She is on a statin, Lipitor 20 mg.   Current Meds  Medication Sig  . amLODipine (NORVASC) 2.5 MG tablet Take 3 tablets (7.5 mg total) by mouth daily.  Marland Kitchen aspirin 81 MG tablet Take 81 mg by mouth daily.  Marland Kitchen atorvastatin (LIPITOR) 20 MG tablet Take 1 tablet (20 mg total) by mouth at bedtime.  . cyanocobalamin 500 MCG tablet Take 500 mcg by mouth daily.  . cycloSPORINE (RESTASIS) 0.05 % ophthalmic emulsion Place 1 drop into both eyes 2 (two) times daily.  Marland Kitchen donepezil (ARICEPT) 10 MG tablet Take 1 tablet daily (Patient taking differently: Take 10 mg by mouth at bedtime. )  . isosorbide mononitrate (IMDUR) 60 MG 24 hr tablet Take 1  tablet (60 mg total) by mouth daily.  Marland Kitchen losartan (COZAAR) 100 MG tablet Take 100 mg by mouth daily.  . metoprolol (LOPRESSOR) 50 MG tablet Take 1 tablet (50 mg total) by mouth 2 (two) times daily.  . mirtazapine (REMERON) 15 MG tablet Take 1 tablet (15 mg total) by mouth at bedtime.  . nitroGLYCERIN (NITROSTAT) 0.4 MG SL tablet Place 1 tablet (0.4 mg total) under the tongue every 5 (five) minutes as needed for chest pain.   No Known Allergies Past Medical History:  Diagnosis Date  . Acute kidney injury ([place])   . C. difficile colitis 2016  . Colonic polyp   . Depression   . Diverticulosis   . GERD (gastroesophageal reflux disease)   . Headache    sinus  . Helicobacter pylori gastritis 1987  . Hepatic cyst   . Hyperlipemia   . Hypertension   . Pneumonia    hx of x 2   . PUD (peptic ulcer disease) 1987   with h pylori   Family History  Problem Relation Age of Onset  . Asthma Brother   . Hypertension Father   . Heart attack Father 47  . Leukemia Mother   . Stomach cancer Maternal Aunt   . Breast cancer Sister   . Thyroid disease Sister   . Stroke Neg Hx   . Diabetes Neg Hx    Past Surgical History:  Procedure Laterality Date  . ABDOMINAL HYSTERECTOMY  BSO ; endometriosis; ? Appendectomy incidentally  . CHOLECYSTECTOMY  2008  . COLONOSCOPY  2003 & 2012   polyps; Dr Collene Mares  . CYSTOSCOPY  10/19/2016   Procedure: CYSTOSCOPY;  Surgeon: Nickie Retort, MD;  Location: [facility];  Service: Urology;;  . endoscopy gastritis  2008  . FLEXIBLE SIGMOIDOSCOPY N/A 05/31/2016   Procedure: FLEXIBLE SIGMOIDOSCOPY;  Surgeon: Juanita Craver, MD;  Location: [facility];  Service: Endoscopy;  Laterality: N/A;  . LAPAROSCOPIC PARTIAL COLECTOMY N/A 10/19/2016   Procedure: LAPAROSCOPIC ASSISTED SIGMIOD COLOECTOMY MOBILIZATION OF SPLEENIC [place];  Surgeon: Jackolyn Confer, MD;  Location: [facility];  Service: General;  Laterality: N/A;  . ROTATOR CUFF REPAIR     right  . TONSILLECTOMY  AND ADENOIDECTOMY     Social History   Social History  . Marital status: Married    Spouse name: N/A  . Number of children: 2  . Years of education: N/A   Occupational History  . Not on file.   Social History Main Topics  . Smoking status: Former Smoker    Quit date: 12/10/1973  . Smokeless tobacco: Never Used  . Alcohol use 0.0 oz/week     Comment: 2 glasses of wine daily   . Drug use: No  . Sexual activity: Not on file   Other Topics Concern  . Not on file   Social History Narrative  . No narrative on file     Review of Systems: General: negative for chills, fever, night sweats or weight changes.  Cardiovascular: negative for chest pain, dyspnea on exertion, edema, orthopnea, palpitations, paroxysmal nocturnal dyspnea or shortness of breath Dermatological: negative for rash Respiratory: negative for cough or wheezing Urologic: negative for hematuria Abdominal: negative for nausea, vomiting, diarrhea, bright red blood per rectum, melena, or hematemesis Neurologic: negative for visual changes, syncope, or dizziness All other systems reviewed and are otherwise negative except as noted above.   Physical Exam:  Blood pressure 126/64, pulse 76, height 5\' 7"  (1.702 m), weight 153 lb (69.4 kg).  General appearance: alert, cooperative and no distress Neck: no carotid bruit and no JVD Lungs: clear to auscultation bilaterally Heart: regular rate and rhythm, S1, S2 normal, no murmur, click, rub or gallop Extremities: extremities normal, atraumatic, no cyanosis or edema Pulses: 2+ and symmetric Skin: Skin color, texture, turgor normal. No rashes or lesions Neurologic: Grossly normal  EKG Not performed   ASSESSMENT AND PLAN:   1. Chest Pain: recent NST 11/22/16 showed normal perfusion. Echo showed normal EF and wall motion. Symptoms resolved w/o recurrence. Suspect noncardiac chest pain/ ? GERD. She denies exertional CP or dyspnea. Continue medical therapy for primary  prevention.   PLAN  F/u in 6 months. If doing well at that visit, can consider PRN f/u going forward.   Brittainy Simmons PA-C 11/27/2016 10:50 AM

## 2016-11-27 NOTE — Patient Instructions (Signed)
Medication Instructions:  Your physician recommends that you continue on your current medications as directed. Please refer to the Current Medication list given to you today.    Labwork: none  Testing/Procedures: None   Follow-Up: Your physician wants you to follow-up in: 6 months with Dr Meda Coffee. (June 2018).  You will receive a reminder letter in the mail two months in advance. If you don't receive a letter, please call our office to schedule the follow-up appointment.        If you need a refill on your cardiac medications before your next appointment, please call your pharmacy.

## 2016-11-29 DIAGNOSIS — Z433 Encounter for attention to colostomy: Secondary | ICD-10-CM | POA: Diagnosis not present

## 2016-11-29 DIAGNOSIS — R2689 Other abnormalities of gait and mobility: Secondary | ICD-10-CM | POA: Diagnosis not present

## 2016-11-29 DIAGNOSIS — M6281 Muscle weakness (generalized): Secondary | ICD-10-CM | POA: Diagnosis not present

## 2016-11-29 DIAGNOSIS — I10 Essential (primary) hypertension: Secondary | ICD-10-CM | POA: Diagnosis not present

## 2016-12-05 ENCOUNTER — Other Ambulatory Visit: Payer: Self-pay | Admitting: *Deleted

## 2016-12-05 MED ORDER — ISOSORBIDE MONONITRATE ER 60 MG PO TB24: 60.0000 mg | ORAL_TABLET | Freq: Every day | ORAL | 1 refills | Status: DC

## 2016-12-16 ENCOUNTER — Other Ambulatory Visit: Payer: Self-pay | Admitting: Internal Medicine

## 2016-12-17 NOTE — Telephone Encounter (Signed)
Please advise, not on current med list.  

## 2016-12-25 DIAGNOSIS — Z0289 Encounter for other administrative examinations: Secondary | ICD-10-CM

## 2016-12-26 ENCOUNTER — Ambulatory Visit: Payer: Medicare Other | Admitting: Internal Medicine

## 2016-12-31 ENCOUNTER — Telehealth: Payer: Self-pay | Admitting: *Deleted

## 2016-12-31 ENCOUNTER — Telehealth: Payer: Self-pay

## 2016-12-31 MED ORDER — MIRTAZAPINE 15 MG PO TABS: 15.0000 mg | ORAL_TABLET | Freq: Every day | ORAL | 1 refills | Status: DC

## 2016-12-31 MED ORDER — AMLODIPINE BESYLATE 2.5 MG PO TABS: 7.5000 mg | ORAL_TABLET | Freq: Every day | ORAL | 1 refills | Status: DC

## 2016-12-31 NOTE — Telephone Encounter (Signed)
LVM on mobile number and tried calling home number with no vm set up to let patient know forms for [facility] were almost complete there are just a couple questions before being faxed.

## 2016-12-31 NOTE — Telephone Encounter (Signed)
Patients husband called back and answered questions. Copy of forms sent to fax and sent to scan.

## 2016-12-31 NOTE — Telephone Encounter (Signed)
Rec'd call from husband requesting wife amlodipine & remeron be change to a 90 day supply. Inform pt will send updated script to [facility]. Sent electronically...Johny Chess

## 2017-01-06 NOTE — Progress Notes (Signed)
Subjective:    Patient ID: Toni [name], female    DOB: 01-25-1934, 83 y.o.   MRN: [id number]  HPI She is here for follow up.     Depression: She stopped her sertraline and increased her remeron at her last visit.  She is taking her medication daily as prescribed. She denies any side effects from the medication. She feels her depression is better controlled and she is happy with her current dose of medication. Per her husband she is doing better.  Her humor is better.  She still has some depression at times, but he feels she is doing well.     Colostomy bag -  Her husband changes her colostomy bag. She has some occasional discomfort. She has followed up with her surgeon in the last month and he felt everything looked good. She does have some irritation around the ostomy at times. Her husband has not been putting anything on the area because he is unsure if he should've more what he should do. They have a home nurse coming in for a short period of time, but she no longer comes to the house. I will be writing for supplies.   Medications and allergies reviewed with patient and updated if appropriate.  Patient Active Problem List   Diagnosis Date Noted  . Loss of weight 11/25/2016  . NSTEMI (non-ST elevated myocardial infarction) ([place]) 10/26/2016  . Accelerating angina ([place]) 10/23/2016  . S/P laparoscopic colectomy 10/19/2016  . Colovesical fistula 08/27/2016  . Chronic fatigue 03/30/2016  . Hyperlipidemia 03/22/2016  . GERD (gastroesophageal reflux disease) 12/26/2015  . Mild cognitive impairment 09/21/2015  . Depression 09/21/2015  . History of stroke 09/21/2015  . CKD (chronic kidney disease)   . Lesion of right native kidney 04/13/2015  . Diastolic CHF ([place]) 123456  . History of Clostridium difficile infection 04/05/2015  . Essential hypertension 03/23/2015  . Abnormal CT of brain 03/04/2015  . Cervicogenic headache 01/22/2012  . Diverticulosis of large intestine  10/28/2009  . DIVERTICULITIS, HX OF 10/28/2009  . FASTING HYPERGLYCEMIA 03/07/2009  . COLONIC POLYPS, HX OF 03/07/2009  . ANXIETY STATE, UNSPECIFIED 07/28/2008  . DRY EYE SYNDROME 03/12/2008  . HYPERLIPIDEMIA 04/21/2007  . HEPATIC CYST 04/21/2007  . MIGRAINES, HX OF 04/21/2007    Current Outpatient Prescriptions on File Prior to Visit  Medication Sig Dispense Refill  . amLODipine (NORVASC) 2.5 MG tablet Take 3 tablets (7.5 mg total) by mouth daily. 270 tablet 1  . aspirin 81 MG tablet Take 81 mg by mouth daily.    Marland Kitchen atorvastatin (LIPITOR) 20 MG tablet Take 1 tablet (20 mg total) by mouth at bedtime. 30 tablet 5  . cyanocobalamin 500 MCG tablet Take 500 mcg by mouth daily.    . cycloSPORINE (RESTASIS) 0.05 % ophthalmic emulsion Place 1 drop into both eyes 2 (two) times daily.    Marland Kitchen donepezil (ARICEPT) 10 MG tablet Take 1 tablet daily (Patient taking differently: Take 10 mg by mouth at bedtime. ) 90 tablet 3  . isosorbide mononitrate (IMDUR) 60 MG 24 hr tablet Take 1 tablet (60 mg total) by mouth daily. 90 tablet 1  . losartan (COZAAR) 100 MG tablet TAKE 1 TABLET BY MOUTH EVERY DAY 90 tablet 1  . metoprolol (LOPRESSOR) 50 MG tablet Take 1 tablet (50 mg total) by mouth 2 (two) times daily. 60 tablet 6  . mirtazapine (REMERON) 15 MG tablet Take 1 tablet (15 mg total) by mouth at bedtime. 90 tablet 1  No current facility-administered medications on file prior to visit.     Past Medical History:  Diagnosis Date  . Acute kidney injury ([place])   . C. difficile colitis 2016  . Colonic polyp   . Depression   . Diverticulosis   . GERD (gastroesophageal reflux disease)   . Headache    sinus  . Helicobacter pylori gastritis 1987  . Hepatic cyst   . Hyperlipemia   . Hypertension   . Pneumonia    hx of x 2   . PUD (peptic ulcer disease) 1987   with h pylori    Past Surgical History:  Procedure Laterality Date  . ABDOMINAL HYSTERECTOMY     BSO ; endometriosis; ? Appendectomy  incidentally  . CHOLECYSTECTOMY  2008  . COLONOSCOPY  2003 & 2012   polyps; Dr Collene Mares  . CYSTOSCOPY  10/19/2016   Procedure: CYSTOSCOPY;  Surgeon: Nickie Retort, MD;  [facility];  Service: Urology;;  . endoscopy gastritis  2008  . FLEXIBLE SIGMOIDOSCOPY N/A 05/31/2016   Procedure: FLEXIBLE SIGMOIDOSCOPY;  Surgeon: Juanita Craver, MD;  [facility];  Service: Endoscopy;  Laterality: N/A;  . LAPAROSCOPIC PARTIAL COLECTOMY N/A 10/19/2016   Procedure: LAPAROSCOPIC ASSISTED SIGMIOD COLOECTOMY MOBILIZATION OF SPLEENIC [place];  Surgeon: Jackolyn Confer, MD;  Location[facility];  Service: General;  Laterality: N/A;  . ROTATOR CUFF REPAIR     right  . TONSILLECTOMY AND ADENOIDECTOMY      Social History   Social History  . Marital status: Married    Spouse name: N/A  . Number of children: 2  . Years of education: N/A   Social History Main Topics  . Smoking status: Former Smoker    Quit date: 12/10/1973  . Smokeless tobacco: Never Used  . Alcohol use 0.0 oz/week     Comment: 2 glasses of wine daily   . Drug use: No  . Sexual activity: Not on file   Other Topics Concern  . Not on file   Social History Narrative  . No narrative on file    Family History  Problem Relation Age of Onset  . Asthma Brother   . Hypertension Father   . Heart attack Father 6  . Leukemia Mother   . Stomach cancer Maternal Aunt   . Breast cancer Sister   . Thyroid disease Sister   . Stroke Neg Hx   . Diabetes Neg Hx     Review of Systems  Constitutional: Negative for chills and fever. Appetite change: getting better.  Respiratory: Negative for cough, shortness of breath and wheezing.   Cardiovascular: Negative for chest pain, palpitations and leg swelling.  Gastrointestinal: Negative for abdominal pain and blood in stool.       No gerd  Neurological: Positive for headaches (right side). Negative for light-headedness.  Psychiatric/Behavioral: Positive for dysphoric mood  (better). Negative for sleep disturbance. The patient is nervous/anxious.        Objective:   Vitals:   01/07/17 1114  BP: 132/80  Pulse: 66  Resp: 16  Temp: 97.9 F (36.6 C)   Filed Weights   01/07/17 1114  Weight: 157 lb (71.2 kg)   Body mass index is 24.59 kg/m.  Wt Readings from Last 3 Encounters:  01/07/17 157 lb (71.2 kg)  11/27/16 153 lb (69.4 kg)  11/23/16 152 lb (68.9 kg)     Physical Exam Constitutional: Appears well-developed and well-nourished. No distress.  HENT:  Head: Normocephalic and atraumatic.  Neck: Neck supple. No  tracheal deviation present. No thyromegaly present.  No cervical lymphadenopathy Cardiovascular: Normal rate, regular rhythm and normal heart sounds.   No murmur heard. No carotid bruit .  No edema Pulmonary/Chest: Effort normal and breath sounds normal. No respiratory distress. No has no wheezes. No rales.  Abdomen: Normal bowel sounds, abdomen soft nontender, ostomy looks clean and dry and her husband removed for back. Light erythema and excoriations around ostomy  Skin: Skin is warm and dry. Not diaphoretic.  Psychiatric: Normal mood and affect. Behavior is normal.         Assessment & Plan:   See Problem List for Assessment and Plan of chronic medical problems.

## 2017-01-07 ENCOUNTER — Telehealth: Payer: Self-pay | Admitting: Emergency Medicine

## 2017-01-07 ENCOUNTER — Encounter: Payer: Self-pay | Admitting: Internal Medicine

## 2017-01-07 ENCOUNTER — Ambulatory Visit (INDEPENDENT_AMBULATORY_CARE_PROVIDER_SITE_OTHER): Payer: Medicare Other | Admitting: Internal Medicine

## 2017-01-07 DIAGNOSIS — Z933 Colostomy status: Secondary | ICD-10-CM

## 2017-01-07 DIAGNOSIS — F419 Anxiety disorder, unspecified: Secondary | ICD-10-CM

## 2017-01-07 DIAGNOSIS — F3289 Other specified depressive episodes: Secondary | ICD-10-CM

## 2017-01-07 NOTE — Telephone Encounter (Signed)
She should stop this - she is currently on lipitor (atorvastatin) and theses are the same type of medications.  Stop simvastatin

## 2017-01-07 NOTE — Progress Notes (Signed)
Pre visit review using our clinic review tool, if applicable. No additional management support is needed unless otherwise documented below in the visit note. 

## 2017-01-07 NOTE — Patient Instructions (Addendum)
  Medications reviewed and updated.  No changes recommended at this time.     Please followup in 6 months   

## 2017-01-07 NOTE — Assessment & Plan Note (Signed)
Areas surrounding colostomy is clean and dry  - there is very mild erythema and slight skin excoriations I will sign for supplies Will see if we can provide her with any other support for her colostomy regarding care if needed

## 2017-01-07 NOTE — Telephone Encounter (Signed)
Pts husband called back to inform that Zocor 100 mg was not on pts med list. Pt is currently taking once a day. Should she continue this, or can she discontinue? Please advise.

## 2017-01-07 NOTE — Assessment & Plan Note (Signed)
Controlled, stable Continue current dose of medication  

## 2017-01-07 NOTE — Assessment & Plan Note (Signed)
Depression improved Controlled, stable Continue current dose of medication

## 2017-01-08 NOTE — Telephone Encounter (Signed)
This was a confusion in the medication. They are wanting to know if pt should be taking Zoloft. Please advise. Pts husband also asked about someone taking care of the pts colostomy bag?

## 2017-01-08 NOTE — Telephone Encounter (Signed)
They were supposed to taper off the zoloft after her 12/15 appt - if taking 100 mg daily - decrease to 50 mg daily for one week then stop.   I need some more time to look into the colostomy bag.

## 2017-01-09 NOTE — Telephone Encounter (Signed)
Spoke with pt's husband to inform.  

## 2017-01-30 ENCOUNTER — Other Ambulatory Visit (INDEPENDENT_AMBULATORY_CARE_PROVIDER_SITE_OTHER): Payer: Medicare Other

## 2017-01-30 ENCOUNTER — Encounter: Payer: Self-pay | Admitting: Internal Medicine

## 2017-01-30 ENCOUNTER — Ambulatory Visit (INDEPENDENT_AMBULATORY_CARE_PROVIDER_SITE_OTHER): Payer: Medicare Other | Admitting: Internal Medicine

## 2017-01-30 VITALS — BP 118/68 | HR 68 | Temp 97.9°F | Resp 16 | Ht 67.0 in | Wt 166.0 lb

## 2017-01-30 DIAGNOSIS — R600 Localized edema: Secondary | ICD-10-CM | POA: Diagnosis not present

## 2017-01-30 DIAGNOSIS — I10 Essential (primary) hypertension: Secondary | ICD-10-CM

## 2017-01-30 DIAGNOSIS — I5030 Unspecified diastolic (congestive) heart failure: Secondary | ICD-10-CM | POA: Diagnosis not present

## 2017-01-30 DIAGNOSIS — R609 Edema, unspecified: Secondary | ICD-10-CM | POA: Insufficient documentation

## 2017-01-30 LAB — URINALYSIS, ROUTINE W REFLEX MICROSCOPIC
BILIRUBIN URINE: NEGATIVE
Hgb urine dipstick: NEGATIVE
KETONES UR: NEGATIVE
NITRITE: NEGATIVE
RBC / HPF: NONE SEEN (ref 0–?)
SPECIFIC GRAVITY, URINE: 1.015 (ref 1.000–1.030)
Total Protein, Urine: NEGATIVE
URINE GLUCOSE: NEGATIVE
Urobilinogen, UA: 0.2 (ref 0.0–1.0)
pH: 5.5 (ref 5.0–8.0)

## 2017-01-30 LAB — COMPREHENSIVE METABOLIC PANEL
ALBUMIN: 4.2 g/dL (ref 3.5–5.2)
ALT: 11 U/L (ref 0–35)
AST: 12 U/L (ref 0–37)
Alkaline Phosphatase: 75 U/L (ref 39–117)
BUN: 21 mg/dL (ref 6–23)
CHLORIDE: 107 meq/L (ref 96–112)
CO2: 27 meq/L (ref 19–32)
CREATININE: 1.02 mg/dL (ref 0.40–1.20)
Calcium: 9.4 mg/dL (ref 8.4–10.5)
GFR: 55.28 mL/min — ABNORMAL LOW (ref >60.00)
Glucose, Bld: 96 mg/dL (ref 70–99)
Potassium: 4.5 mEq/L (ref 3.5–5.1)
Sodium: 140 mEq/L (ref 135–145)
Total Bilirubin: 0.5 mg/dL (ref 0.2–1.2)
Total Protein: 6.9 g/dL (ref 6.0–8.3)

## 2017-01-30 LAB — CBC WITH DIFFERENTIAL/PLATELET
BASOS PCT: 0.5 % (ref 0.0–3.0)
Basophils Absolute: 0 10*3/uL (ref 0.0–0.1)
EOS ABS: 0.2 10*3/uL (ref 0.0–0.7)
Eosinophils Relative: 3 % (ref 0.0–5.0)
HCT: 39 % (ref 36.0–46.0)
Hemoglobin: 13.2 g/dL (ref 12.0–15.0)
Lymphocytes Relative: 26.5 % (ref 12.0–46.0)
Lymphs Abs: 1.7 10*3/uL (ref 0.7–4.0)
MCHC: 33.8 g/dL (ref 30.0–36.0)
MCV: 88.5 fl (ref 78.0–100.0)
MONO ABS: 0.4 10*3/uL (ref 0.1–1.0)
Monocytes Relative: 6.4 % (ref 3.0–12.0)
NEUTROS ABS: 4.2 10*3/uL (ref 1.4–7.7)
Neutrophils Relative %: 63.6 % (ref 43.0–77.0)
Platelets: 309 10*3/uL (ref 150.0–400.0)
RBC: 4.41 Mil/uL (ref 3.87–5.11)
RDW: 16.1 % — AB (ref 11.5–15.5)
WBC: 6.6 10*3/uL (ref 4.0–10.5)

## 2017-01-30 LAB — THYROID PANEL WITH TSH
Free Thyroxine Index: 2.1 (ref 1.4–3.8)
T3 UPTAKE: 31 % (ref 22–35)
T4 TOTAL: 6.9 ug/dL (ref 4.5–12.0)
TSH: 2.1 mIU/L

## 2017-01-30 LAB — D-DIMER, QUANTITATIVE (NOT AT ARMC): D DIMER QUANT: 0.9 ug{FEU}/mL — AB (ref <0.50)

## 2017-01-30 LAB — BRAIN NATRIURETIC PEPTIDE: PRO B NATRI PEPTIDE: 97 pg/mL (ref 0.0–100.0)

## 2017-01-30 MED ORDER — TORSEMIDE 10 MG PO TABS: 10.0000 mg | ORAL_TABLET | Freq: Every day | ORAL | 1 refills | Status: DC

## 2017-01-30 NOTE — Progress Notes (Signed)
Pre visit review using our clinic review tool, if applicable. No additional management support is needed unless otherwise documented below in the visit note. 

## 2017-01-30 NOTE — Progress Notes (Signed)
Subjective:  Patient ID: Toni [name], female    DOB: 1933-12-22  Age: 83 y.o. MRN: [id number]  CC: Hypertension  NEW TO ME  HPI Toni [name] presents for the complaint of painless LE edema that has developed slowly over the last week, this started after she was told to increase the dose of her CCB. She denies CP,cough,SOB,DOE,palpitations, or fatigue. She has a hx of HFpEF.  Outpatient Medications Prior to Visit  Medication Sig Dispense Refill  . aspirin 81 MG tablet Take 81 mg by mouth daily.    Marland Kitchen atorvastatin (LIPITOR) 20 MG tablet Take 1 tablet (20 mg total) by mouth at bedtime. 30 tablet 5  . cyanocobalamin 500 MCG tablet Take 500 mcg by mouth daily.    . cycloSPORINE (RESTASIS) 0.05 % ophthalmic emulsion Place 1 drop into both eyes 2 (two) times daily.    Marland Kitchen donepezil (ARICEPT) 10 MG tablet Take 1 tablet daily (Patient taking differently: Take 10 mg by mouth at bedtime. ) 90 tablet 3  . isosorbide mononitrate (IMDUR) 60 MG 24 hr tablet Take 1 tablet (60 mg total) by mouth daily. 90 tablet 1  . losartan (COZAAR) 100 MG tablet TAKE 1 TABLET BY MOUTH EVERY DAY 90 tablet 1  . metoprolol (LOPRESSOR) 50 MG tablet Take 1 tablet (50 mg total) by mouth 2 (two) times daily. 60 tablet 6  . mirtazapine (REMERON) 15 MG tablet Take 1 tablet (15 mg total) by mouth at bedtime. 90 tablet 1  . amLODipine (NORVASC) 2.5 MG tablet Take 3 tablets (7.5 mg total) by mouth daily. 270 tablet 1   No facility-administered medications prior to visit.     ROS Review of Systems  Constitutional: Positive for unexpected weight change (wt gain). Negative for appetite change, chills, diaphoresis and fatigue.  HENT: Negative.  Negative for trouble swallowing.   Eyes: Negative for visual disturbance.  Respiratory: Negative for cough, chest tightness, shortness of breath and wheezing.   Cardiovascular: Positive for leg swelling. Negative for chest pain and palpitations.  Gastrointestinal: Negative  for abdominal pain, constipation, diarrhea, nausea and vomiting.  Endocrine: Negative.  Negative for cold intolerance and heat intolerance.  Genitourinary: Negative.   Musculoskeletal: Negative.  Negative for back pain, myalgias and neck pain.  Skin: Negative.  Negative for pallor.  Allergic/Immunologic: Negative.   Neurological: Negative.  Negative for dizziness, syncope, weakness and headaches.  Hematological: Negative for adenopathy. Does not bruise/bleed easily.  Psychiatric/Behavioral: Negative.     Objective:  BP 118/68 (BP Location: Left Arm, Patient Position: Sitting, Cuff Size: Normal)   Pulse 68   Temp 97.9 F (36.6 C) (Oral)   Ht 5\' 7"  (1.702 m)   Wt 166 lb (75.3 kg)   SpO2 98%   BMI 26.00 kg/m   BP Readings from Last 3 Encounters:  01/30/17 118/68  01/07/17 132/80  11/27/16 126/64    Wt Readings from Last 3 Encounters:  01/30/17 166 lb (75.3 kg)  01/07/17 157 lb (71.2 kg)  11/27/16 153 lb (69.4 kg)    Physical Exam  Constitutional: She is oriented to person, place, and time. No distress.  HENT:  Mouth/Throat: Oropharynx is clear and moist. No oropharyngeal exudate.  Eyes: Conjunctivae are normal. Right eye exhibits no discharge. Left eye exhibits no discharge. No scleral icterus.  Neck: Normal range of motion. Neck supple. No JVD present. No tracheal deviation present. No thyromegaly present.  Cardiovascular: Normal rate, regular rhythm, S1 normal, S2 normal and normal heart sounds.  Exam  reveals no gallop and no friction rub.   No murmur heard. EKG ---  Normal SR normal EKG   Pulmonary/Chest: Effort normal. No accessory muscle usage or stridor. No tachypnea. No respiratory distress. She has no decreased breath sounds. She has no wheezes. She has no rhonchi. She has rales in the right lower field and the left lower field. She exhibits no tenderness.  Abdominal: Soft. Bowel sounds are normal. She exhibits no distension. There is no tenderness. There is no  rebound and no guarding.  Musculoskeletal: Normal range of motion. She exhibits edema (2+ pitting edema in BLE). She exhibits no tenderness or deformity.  Lymphadenopathy:    She has no cervical adenopathy.  Neurological: She is oriented to person, place, and time.  Skin: Skin is warm and dry. No rash noted. She is not diaphoretic. No erythema. No pallor.  Vitals reviewed.   Lab Results  Component Value Date   WBC 6.6 01/30/2017   HGB 13.2 01/30/2017   HCT 39.0 01/30/2017   PLT 309.0 01/30/2017   GLUCOSE 96 01/30/2017   CHOL 120 10/23/2016   TRIG 127 10/23/2016   HDL 37 (L) 10/23/2016   LDLDIRECT 135.1 11/23/2013   LDLCALC 58 10/23/2016   ALT 11 01/30/2017   AST 12 01/30/2017   NA 140 01/30/2017   K 4.5 01/30/2017   CL 107 01/30/2017   CREATININE 1.02 01/30/2017   BUN 21 01/30/2017   CO2 27 01/30/2017   TSH 2.10 01/30/2017   INR 1.20 04/05/2015   HGBA1C 5.3 10/16/2016    No results found.  Assessment & Plan:   Toni [name] was seen today for hypertension.  Diagnoses and all orders for this visit:  Diastolic congestive heart failure, unspecified congestive heart failure chronicity ([place])- will diurese with a loop diuretic -     torsemide (DEMADEX) 10 MG tablet; Take 1 tablet (10 mg total) by mouth daily.  Localized edema- her EKG is normal and labs are negative for metabolic, renal, or systemic causes for edema. Will d/c the CCB and start torsemide. -     EKG 12-Lead -     CBC with Differential/Platelet; Future -     Urinalysis, Routine w reflex microscopic; Future -     Thyroid Panel With TSH; Future -     Brain natriuretic peptide; Future -     torsemide (DEMADEX) 10 MG tablet; Take 1 tablet (10 mg total) by mouth daily. -     D-dimer, quantitative (not at [facility]); Future  Essential hypertension- will stop the CCB due to edema -     Comprehensive metabolic panel; Future -     CBC with Differential/Platelet; Future -     torsemide (DEMADEX) 10 MG tablet; Take 1 tablet  (10 mg total) by mouth daily.   I have discontinued Toni [name]'s amLODipine. I am also having her start on torsemide. Additionally, I am having her maintain her aspirin, cyanocobalamin, donepezil, cycloSPORINE, metoprolol, atorvastatin, isosorbide mononitrate, losartan, and mirtazapine.  Meds ordered this encounter  Medications  . torsemide (DEMADEX) 10 MG tablet    Sig: Take 1 tablet (10 mg total) by mouth daily.    Dispense:  90 tablet    Refill:  1     Follow-up: Return in about 3 weeks (around 02/20/2017).  Scarlette Calico, MD

## 2017-01-30 NOTE — Patient Instructions (Signed)
Edema Edema is an abnormal buildup of fluids in your bodytissues. Edema is somewhatdependent on gravity to pull the fluid to the lowest place in your body. That makes the condition more common in the legs and thighs (lower extremities). Painless swelling of the feet and ankles is common and becomes more likely as you get older. It is also common in looser tissues, like around your eyes. When the affected area is squeezed, the fluid may move out of that spot and leave a dent for a few moments. This dent is called pitting. What are the causes? There are many possible causes of edema. Eating too much salt and being on your feet or sitting for a long time can cause edema in your legs and ankles. Hot weather may make edema worse. Common medical causes of edema include:  Heart failure.  Liver disease.  Kidney disease.  Weak blood vessels in your legs.  Cancer.  An injury.  Pregnancy.  Some medications.  Obesity.  What are the signs or symptoms? Edema is usually painless.Your skin may look swollen or shiny. How is this diagnosed? Your health care provider may be able to diagnose edema by asking about your medical history and doing a physical exam. You may need to have tests such as X-rays, an electrocardiogram, or blood tests to check for medical conditions that may cause edema. How is this treated? Edema treatment depends on the cause. If you have heart, liver, or kidney disease, you need the treatment appropriate for these conditions. General treatment may include:  Elevation of the affected body part above the level of your heart.  Compression of the affected body part. Pressure from elastic bandages or support stockings squeezes the tissues and forces fluid back into the blood vessels. This keeps fluid from entering the tissues.  Restriction of fluid and salt intake.  Use of a water pill (diuretic). These medications are appropriate only for some types of edema. They pull fluid  out of your body and make you urinate more often. This gets rid of fluid and reduces swelling, but diuretics can have side effects. Only use diuretics as directed by your health care provider.  Follow these instructions at home:  Keep the affected body part above the level of your heart when you are lying down.  Do not sit still or stand for prolonged periods.  Do not put anything directly under your knees when lying down.  Do not wear constricting clothing or garters on your upper legs.  Exercise your legs to work the fluid back into your blood vessels. This may help the swelling go down.  Wear elastic bandages or support stockings to reduce ankle swelling as directed by your health care provider.  Eat a low-salt diet to reduce fluid if your health care provider recommends it.  Only take medicines as directed by your health care provider. Contact a health care provider if:  Your edema is not responding to treatment.  You have heart, liver, or kidney disease and notice symptoms of edema.  You have edema in your legs that does not improve after elevating them.  You have sudden and unexplained weight gain. Get help right away if:  You develop shortness of breath or chest pain.  You cannot breathe when you lie down.  You develop pain, redness, or warmth in the swollen areas.  You have heart, liver, or kidney disease and suddenly get edema.  You have a fever and your symptoms suddenly get worse. This information is   not intended to replace advice given to you by your health care provider. Make sure you discuss any questions you have with your health care provider. Document Released: 11/26/2005 Document Revised: 05/03/2016 Document Reviewed: 09/18/2013 Elsevier Interactive Patient Education  2017 [employer]  

## 2017-01-31 ENCOUNTER — Telehealth: Payer: Self-pay | Admitting: Emergency Medicine

## 2017-01-31 NOTE — Telephone Encounter (Addendum)
Pts husband called and has some questions about patients medications. He wants to know if she was taken off certain medication and suppose to increase another. Please give him a call back on his cell [phone]. Thanks.

## 2017-02-01 NOTE — Telephone Encounter (Signed)
Spoke with pt's husband to clarify all medication. He will call back if he feels that patient should go back on Zoloft.

## 2017-02-05 ENCOUNTER — Telehealth: Payer: Self-pay | Admitting: Internal Medicine

## 2017-02-05 NOTE — Telephone Encounter (Signed)
Patient's husband wants a call back with the results of the patient's recent labs taken.

## 2017-02-06 NOTE — Telephone Encounter (Signed)
Spoke with pts husband. He was able to find results on MyChart

## 2017-02-18 NOTE — Progress Notes (Signed)
Subjective:    Patient ID: Toni [name], female    DOB: Mar 02, 1934, 83 y.o.   MRN: [id number]  HPI She is here for follow up.  She was seen 2/21 by Dr Ronnald Ramp for LE edema that was present for one week. She told Dr Ronnald Ramp the edema started after she was told to increase her amlodipine.  She did not have any cough, SOB, DOE, palpitations or fatigue. She had 2+ pitting edema in both lower extremities and bibasilar rales.  Her amlodipine was discontinued and she was started on torsemide 10 mg daily.   Her BNP was 97.  There was no evidence of infection.  Her kidney function was minimally reduced - stable.   She is taking the torsemide daily, but does not feel she has increased urination with it.  Her swelling is better - slight swelling maybe.   She stopped the amlodipine.  She denies SOB and has no concerns.    Depression, sad, angry, irritable, paranoid:  Her husband let us know she has been feeling this way.  Initially when asked she states she feels well, but admits to feeling irritable and sometimes taking it out on her husband.  She is sleeping well.    Hypertension: She is taking her medication daily. She is compliant with a low sodium diet.  She denies chest pain, palpitations, edema, shortness of breath and regular headaches. She is not exercising regularly now, but is active.  She does walk when the weather is warmer.     Medications and allergies reviewed with patient and updated if appropriate.  Patient Active Problem List   Diagnosis Date Noted  . Edema 01/30/2017  . Colostomy status ([place]) 01/07/2017  . Loss of weight 11/25/2016  . NSTEMI (non-ST elevated myocardial infarction) ([place]) 10/26/2016  . Accelerating angina ([place]) 10/23/2016  . S/P laparoscopic colectomy 10/19/2016  . Colovesical fistula 08/27/2016  . Chronic fatigue 03/30/2016  . Hyperlipidemia 03/22/2016  . GERD (gastroesophageal reflux disease) 12/26/2015  . Mild cognitive impairment 09/21/2015  .  Depression 09/21/2015  . History of stroke 09/21/2015  . CKD (chronic kidney disease)   . Lesion of right native kidney 04/13/2015  . Diastolic CHF ([place]) 65/78/4696  . History of Clostridium difficile infection 04/05/2015  . Essential hypertension 03/23/2015  . Abnormal CT of brain 03/04/2015  . Cervicogenic headache 01/22/2012  . Diverticulosis of large intestine 10/28/2009  . DIVERTICULITIS, HX OF 10/28/2009  . FASTING HYPERGLYCEMIA 03/07/2009  . COLONIC POLYPS, HX OF 03/07/2009  . Anxiety 07/28/2008  . DRY EYE SYNDROME 03/12/2008  . HYPERLIPIDEMIA 04/21/2007  . HEPATIC CYST 04/21/2007  . MIGRAINES, HX OF 04/21/2007    Current Outpatient Prescriptions on File Prior to Visit  Medication Sig Dispense Refill  . aspirin 81 MG tablet Take 81 mg by mouth daily.    Marland Kitchen atorvastatin (LIPITOR) 20 MG tablet Take 1 tablet (20 mg total) by mouth at bedtime. 30 tablet 5  . cyanocobalamin 500 MCG tablet Take 500 mcg by mouth daily.    . cycloSPORINE (RESTASIS) 0.05 % ophthalmic emulsion Place 1 drop into both eyes 2 (two) times daily.    Marland Kitchen donepezil (ARICEPT) 10 MG tablet Take 1 tablet daily (Patient taking differently: Take 10 mg by mouth at bedtime. ) 90 tablet 3  . isosorbide mononitrate (IMDUR) 60 MG 24 hr tablet Take 1 tablet (60 mg total) by mouth daily. 90 tablet 1  . losartan (COZAAR) 100 MG tablet TAKE 1 TABLET BY MOUTH EVERY DAY  90 tablet 1  . metoprolol (LOPRESSOR) 50 MG tablet Take 1 tablet (50 mg total) by mouth 2 (two) times daily. 60 tablet 6  . mirtazapine (REMERON) 15 MG tablet Take 1 tablet (15 mg total) by mouth at bedtime. 90 tablet 1  . torsemide (DEMADEX) 10 MG tablet Take 1 tablet (10 mg total) by mouth daily. 90 tablet 1   No current facility-administered medications on file prior to visit.     Past Medical History:  Diagnosis Date  . Acute kidney injury ([place])   . C. difficile colitis 2016  . Colonic polyp   . Depression   . Diverticulosis   . GERD  (gastroesophageal reflux disease)   . Headache    sinus  . Helicobacter pylori gastritis 1987  . Hepatic cyst   . Hyperlipemia   . Hypertension   . Pneumonia    hx of x 2   . PUD (peptic ulcer disease) 1987   with h pylori    Past Surgical History:  Procedure Laterality Date  . ABDOMINAL HYSTERECTOMY     BSO ; endometriosis; ? Appendectomy incidentally  . CHOLECYSTECTOMY  2008  . COLONOSCOPY  2003 & 2012   polyps; Dr Collene Mares  . CYSTOSCOPY  10/19/2016   Procedure: CYSTOSCOPY;  Surgeon: Nickie Retort, MD;  Location: [facility];  Service: Urology;;  . endoscopy gastritis  2008  . FLEXIBLE SIGMOIDOSCOPY N/A 05/31/2016   Procedure: FLEXIBLE SIGMOIDOSCOPY;  Surgeon: Juanita Craver, MD;  Location: [facility];  Service: Endoscopy;  Laterality: N/A;  . LAPAROSCOPIC PARTIAL COLECTOMY N/A 10/19/2016   Procedure: LAPAROSCOPIC ASSISTED SIGMIOD COLOECTOMY MOBILIZATION OF SPLEENIC [place];  Surgeon: Jackolyn Confer, MD;  Location: [facility];  Service: General;  Laterality: N/A;  . ROTATOR CUFF REPAIR     right  . TONSILLECTOMY AND ADENOIDECTOMY      Social History   Social History  . Marital status: Married    Spouse name: N/A  . Number of children: 2  . Years of education: N/A   Social History Main Topics  . Smoking status: Former Smoker    Quit date: 12/10/1973  . Smokeless tobacco: Never Used  . Alcohol use 0.0 oz/week     Comment: 2 glasses of wine daily   . Drug use: No  . Sexual activity: Not Asked   Other Topics Concern  . None   Social History Narrative  . None    Family History  Problem Relation Age of Onset  . Asthma Brother   . Hypertension Father   . Heart attack Father 72  . Leukemia Mother   . Stomach cancer Maternal Aunt   . Breast cancer Sister   . Thyroid disease Sister   . Stroke Neg Hx   . Diabetes Neg Hx     Review of Systems  Constitutional: Negative for appetite change, chills and fever.  Respiratory: Negative for cough, shortness of  breath and wheezing.   Cardiovascular: Positive for leg swelling (minimal). Negative for chest pain and palpitations.  Gastrointestinal: Negative for abdominal pain and nausea.  Endocrine: Positive for cold intolerance.  Genitourinary: Negative for dysuria and hematuria.  Musculoskeletal: Positive for gait problem.  Neurological: Positive for light-headedness and headaches (occasional).  Psychiatric/Behavioral: Positive for dysphoric mood. Negative for confusion and sleep disturbance. The patient is not nervous/anxious.        Objective:   Vitals:   02/19/17 1034  BP: 124/78  Pulse: 77  Resp: 16  Temp: 98.1 F (36.7 C)  Filed Weights   02/19/17 1034  Weight: 167 lb (75.8 kg)   Body mass index is 26.16 kg/m.  Wt Readings from Last 3 Encounters:  02/19/17 167 lb (75.8 kg)  01/30/17 166 lb (75.3 kg)  01/07/17 157 lb (71.2 kg)     Physical Exam Constitutional: Appears well-developed and well-nourished. No distress.  HENT:  Head: Normocephalic and atraumatic.  Cardiovascular: Normal rate, regular rhythm and normal heart sounds.   No murmur heard. No carotid bruit .  Trace edema Pulmonary/Chest: Effort normal and breath sounds normal. No respiratory distress. No has no wheezes. No rales.  Skin: Skin is warm and dry. Not diaphoretic.  Psychiatric: Normal mood and affect. Behavior is normal.       Assessment & Plan:   See Problem List for Assessment and Plan of chronic medical problems.

## 2017-02-19 ENCOUNTER — Ambulatory Visit (INDEPENDENT_AMBULATORY_CARE_PROVIDER_SITE_OTHER): Payer: Medicare Other | Admitting: Internal Medicine

## 2017-02-19 ENCOUNTER — Encounter: Payer: Self-pay | Admitting: Internal Medicine

## 2017-02-19 ENCOUNTER — Other Ambulatory Visit (INDEPENDENT_AMBULATORY_CARE_PROVIDER_SITE_OTHER): Payer: Medicare Other

## 2017-02-19 VITALS — BP 124/78 | HR 77 | Temp 98.1°F | Resp 16 | Wt 167.0 lb

## 2017-02-19 DIAGNOSIS — I10 Essential (primary) hypertension: Secondary | ICD-10-CM | POA: Diagnosis not present

## 2017-02-19 DIAGNOSIS — F3289 Other specified depressive episodes: Secondary | ICD-10-CM

## 2017-02-19 DIAGNOSIS — N189 Chronic kidney disease, unspecified: Secondary | ICD-10-CM

## 2017-02-19 DIAGNOSIS — R600 Localized edema: Secondary | ICD-10-CM

## 2017-02-19 DIAGNOSIS — G3184 Mild cognitive impairment, so stated: Secondary | ICD-10-CM

## 2017-02-19 LAB — COMPREHENSIVE METABOLIC PANEL
ALT: 12 U/L (ref 0–35)
AST: 13 U/L (ref 0–37)
Albumin: 4.5 g/dL (ref 3.5–5.2)
Alkaline Phosphatase: 90 U/L (ref 39–117)
BILIRUBIN TOTAL: 0.9 mg/dL (ref 0.2–1.2)
BUN: 24 mg/dL — ABNORMAL HIGH (ref 6–23)
CALCIUM: 10 mg/dL (ref 8.4–10.5)
CHLORIDE: 101 meq/L (ref 96–112)
CO2: 28 meq/L (ref 19–32)
Creatinine, Ser: 1.16 mg/dL (ref 0.40–1.20)
GFR: 47.65 mL/min — AB (ref >60.00)
Glucose, Bld: 101 mg/dL — ABNORMAL HIGH (ref 70–99)
POTASSIUM: 4.3 meq/L (ref 3.5–5.1)
Sodium: 140 mEq/L (ref 135–145)
Total Protein: 7.6 g/dL (ref 6.0–8.3)

## 2017-02-19 MED ORDER — MIRTAZAPINE 30 MG PO TABS: 30.0000 mg | ORAL_TABLET | Freq: Every day | ORAL | 5 refills | Status: DC

## 2017-02-19 NOTE — Assessment & Plan Note (Signed)
Mild, cmp today

## 2017-02-19 NOTE — Progress Notes (Signed)
Pre visit review using our clinic review tool, if applicable. No additional management support is needed unless otherwise documented below in the visit note. 

## 2017-02-19 NOTE — Assessment & Plan Note (Signed)
BP well controlled, trace edema in ankles - overall swelling has resolved I think her swelling was secondary to amlodipine not heart failure ? Need for toresmide Will try holding torsemide to see if she needs it - if swelling recurs will restart cmp today

## 2017-02-19 NOTE — Assessment & Plan Note (Addendum)
Per her husband she has felt depressed, sad, angry and paranoid, she admits to irritability Will try increasing remeron to 30 mg daily On aricept Sees Dr Delice Lesch next month - may need different medication at night - ? seroquel

## 2017-02-19 NOTE — Assessment & Plan Note (Signed)
Still some depression, anger, irritability Will try increasing remeron to 30 mg nightly

## 2017-02-19 NOTE — Patient Instructions (Addendum)
  Test(s) ordered today. Your results will be released to [place] (or called to you) after review, usually within 72hours after test completion. If any changes need to be made, you will be notified at that same time.  Medications reviewed and updated.  Changes include holding the torsemide for now - we will restart it if the swelling in the legs increases.  We will also increase the Remeron to 30 mg at night.  Your prescription(s) have been submitted to your pharmacy. Please take as directed and contact our office if you believe you are having problem(s) with the medication(s).   Please followup in 6 months

## 2017-02-19 NOTE — Assessment & Plan Note (Signed)
BP well controlled Current regimen effective and well tolerated Will try holding torsemide to see if she needs it - if swelling recurs will restart cmp today

## 2017-02-25 ENCOUNTER — Other Ambulatory Visit: Payer: Self-pay | Admitting: *Deleted

## 2017-02-25 DIAGNOSIS — F0390 Unspecified dementia without behavioral disturbance: Secondary | ICD-10-CM

## 2017-02-25 DIAGNOSIS — F03A0 Unspecified dementia, mild, without behavioral disturbance, psychotic disturbance, mood disturbance, and anxiety: Secondary | ICD-10-CM

## 2017-02-25 MED ORDER — DONEPEZIL HCL 10 MG PO TABS: 10.0000 mg | ORAL_TABLET | Freq: Every day | ORAL | 1 refills | Status: DC

## 2017-03-22 ENCOUNTER — Encounter: Payer: Self-pay | Admitting: Neurology

## 2017-03-22 ENCOUNTER — Ambulatory Visit (INDEPENDENT_AMBULATORY_CARE_PROVIDER_SITE_OTHER): Payer: Medicare Other | Admitting: Neurology

## 2017-03-22 VITALS — BP 140/70 | HR 71 | Temp 98.1°F | Resp 16 | Ht 63.0 in | Wt 168.8 lb

## 2017-03-22 DIAGNOSIS — F0390 Unspecified dementia without behavioral disturbance: Secondary | ICD-10-CM

## 2017-03-22 DIAGNOSIS — F03A0 Unspecified dementia, mild, without behavioral disturbance, psychotic disturbance, mood disturbance, and anxiety: Secondary | ICD-10-CM

## 2017-03-22 MED ORDER — DONEPEZIL HCL 10 MG PO TABS: 10.0000 mg | ORAL_TABLET | Freq: Every day | ORAL | 3 refills | Status: DC

## 2017-03-22 MED ORDER — MEMANTINE HCL 10 MG PO TABS: ORAL_TABLET | ORAL | 11 refills | Status: DC

## 2017-03-22 NOTE — Patient Instructions (Signed)
1. Start Namenda 10mg : Take 1 tablet daily for 1 week, then increase to 1 tablet twice a day 2. Continue Aricept 10mg  daily 3. Control of blood pressure, cholesterol, as well as physical exercise and brain stimulation exercises are important for brain health 4. Agree with Home Health  5. Follow-up in 6 months, call for any changes

## 2017-03-22 NOTE — Progress Notes (Signed)
NEUROLOGY FOLLOW UP OFFICE NOTE  Toni [name] [id number]  HISTORY OF PRESENT ILLNESS: I had the pleasure of seeing Toni [name] in follow-up in the neurology clinic on 03/22/2017. The patient was last seen 6 months ago for worsening memory. MMSE in October 2017 was 24/30 (23/30 in April 2017, 25/30 in October 2016). She is again accompanied by her husband who helps supplement the history today. She reports her memory is fine, her husband shakes his head behind her. She denies misplacing things, her husband nods that she does. She rarely drives and denies getting lost when she does. Her husband is in charge of bills. He is also in charge of her medications. She is having some difficulties post-op from abdominal surgery, reporting pain and irritation. Her husband changes her colostomy bag. Several times during the visit, she would become more irritable towards her husband. This was discussed in the past, she has noticed it herself. She tells her husband "you sit there and behave yourself." Remeron was increased by her PCP recently to 30mg  qhs for mood. She has frequent sinus headaches, no dizziness, vision changes, focal numbness/tingling/weakness, no falls. She has had swelling in her ankles.   HPI: This is a pleasant 83 yo ambidextrous left-hand dominant woman with a history of hypertension, hyperlipidemia, presenting for evaluation of memory loss with abnormal head CT. When asked about her memory, Toni [name] reports her short-term memory is "not good at all." Long-term memory is good, she can remember details about her childhood well. She started noticing changes in the past year, but worse in the past 4-5 months. She would forget conversations from 10 minutes ago. She forgot to pay bills in the past 2-3 months. She left the stove on twice around 3-4 months ago. She denies getting lost driving. She denies any problems multitasking, very seldom word-finding difficulties. Her son has noticed  that when he calls her, she would repeat the same story at the end of the call, or have the same conversation 10 minutes later. He also noticed changes in the past 1-2 years, worse in the past 3-4 months. Her daughter feels symptoms started a little longer than 2 years ago, she forgot a conversation that someone was in jail in December 2015. She was staying at her daughter's house and forgot what they had talked about on their to-do list. They also have noticed she is very anxious. Her husband reports she had taken something out of the freezer one time, he came out the next day to see some things were not put back inside. He has noticed that she is more argumentative.   She reports 4 episodes where a shade would come up her right eye. This would last for a few minutes, last episode was in the fall of last year. No associated headache or focal numbness/tingling/weakness. She states she "always has headaches," indicating these are sinus headaches. She had seen Dr. Jacelyn Grip in 2013 and was diagnosed with cervicogenic headaches. She has headaches around three times a week, with right frontal throbbing pain, relieved with over the counter pain medication. There is no associated nausea/vomiting/photo/phonophobia. She has been diagnosed with migraines where she has a round circle in her right eye occurring around 3-4 times a month. She denies any dizziness, diplopia, blurred vision, neck/back pain, focal numbness/tingling/weakness. She has chronic constipations. She has occasional hand tremors. No anosmia. She denies any family history of memory problems. No history of head injuries. She drinks 1 to 1-1/2 glasses of wine at  night.   I personally reviewed head CT without contrast done 03/03/15. There were several hypodensities in the bilateral hemispheres, right greater than left This was read as multifocal posterior circulation infarction with the largest area in the right occiptial pole and smaller area in th e left  occipital cortex. Two small areas of cortical and subcortical infarct in the high right parietal lobe, small vessel infarct in the upper right cerebellum. These could be subacute or chronic. Brain atrophy with mild frontal predominance seen.  MRI brain without contrast which did not show any acute changes, there was generalized atrophy with ventricular enlargement, chronic infarcts in the bilateral occipital lobes, right greater than left, small chronic infarct in the right parietal cortex, chronic microvascular disease.   PAST MEDICAL HISTORY: Past Medical History:  Diagnosis Date  . Acute kidney injury ([place])   . C. difficile colitis 2016  . Colonic polyp   . Depression   . Diverticulosis   . GERD (gastroesophageal reflux disease)   . Headache    sinus  . Helicobacter pylori gastritis 1987  . Hepatic cyst   . Hyperlipemia   . Hypertension   . Pneumonia    hx of x 2   . PUD (peptic ulcer disease) 1987   with h pylori    MEDICATIONS: Current Outpatient Prescriptions on File Prior to Visit  Medication Sig Dispense Refill  . aspirin 81 MG tablet Take 81 mg by mouth daily.    Marland Kitchen atorvastatin (LIPITOR) 20 MG tablet Take 1 tablet (20 mg total) by mouth at bedtime. 30 tablet 5  . cyanocobalamin 500 MCG tablet Take 500 mcg by mouth daily.    . cycloSPORINE (RESTASIS) 0.05 % ophthalmic emulsion Place 1 drop into both eyes 2 (two) times daily.    Marland Kitchen donepezil (ARICEPT) 10 MG tablet Take 1 tablet (10 mg total) by mouth at bedtime. 90 tablet 1  . isosorbide mononitrate (IMDUR) 60 MG 24 hr tablet Take 1 tablet (60 mg total) by mouth daily. 90 tablet 1  . losartan (COZAAR) 100 MG tablet TAKE 1 TABLET BY MOUTH EVERY DAY 90 tablet 1  . metoprolol (LOPRESSOR) 50 MG tablet Take 1 tablet (50 mg total) by mouth 2 (two) times daily. 60 tablet 6  . mirtazapine (REMERON) 30 MG tablet Take 1 tablet (30 mg total) by mouth at bedtime. 30 tablet 5   No current facility-administered medications on file prior  to visit.     ALLERGIES: Allergies  Allergen Reactions  . Amlodipine Other (See Comments)    edema    FAMILY HISTORY: Family History  Problem Relation Age of Onset  . Asthma Brother   . Hypertension Father   . Heart attack Father 46  . Leukemia Mother   . Stomach cancer Maternal Aunt   . Breast cancer Sister   . Thyroid disease Sister   . Stroke Neg Hx   . Diabetes Neg Hx     SOCIAL HISTORY: Social History   Social History  . Marital status: Married    Spouse name: N/A  . Number of children: 2  . Years of education: N/A   Occupational History  . Not on file.   Social History Main Topics  . Smoking status: Former Smoker    Quit date: 12/10/1973  . Smokeless tobacco: Never Used  . Alcohol use 0.0 oz/week     Comment: 2 glasses of wine daily   . Drug use: No  . Sexual activity: Not on file  Other Topics Concern  . Not on file   Social History Narrative  . No narrative on file    REVIEW OF SYSTEMS: Constitutional: No fevers, chills, or sweats, no generalized fatigue, change in appetite Eyes: No visual changes, double vision, eye pain Ear, nose and throat: No hearing loss, ear pain, nasal congestion, sore throat Cardiovascular: No chest pain, palpitations Respiratory:  No shortness of breath at rest or with exertion, wheezes GastrointestinaI: No nausea, vomiting, diarrhea, abdominal pain, fecal incontinence Genitourinary:  No dysuria, urinary retention or frequency Musculoskeletal:  + neck pain, back pain Integumentary: No rash, pruritus, skin lesions Neurological: as above Psychiatric: No depression, insomnia, anxiety Endocrine: No palpitations, fatigue, diaphoresis, mood swings, change in appetite, change in weight, increased thirst Hematologic/Lymphatic:  No anemia, purpura, petechiae. Allergic/Immunologic: no itchy/runny eyes, nasal congestion, recent allergic reactions, rashes  PHYSICAL EXAM: Vitals:   03/22/17 1116  BP: 140/70  Pulse: 71  Resp:  16  Temp: 98.1 F (36.7 C)   General: No acute distress, would occasionally become upset with husband when he speaks of her symptoms Head:  Normocephalic/atraumatic Neck: supple, no paraspinal tenderness, full range of motion Heart:  Regular rate and rhythm Lungs:  Clear to auscultation bilaterally Back: No paraspinal tenderness Skin/Extremities: No rash, no edema Neurological Exam: alert and oriented to person, place, and season. No aphasia or dysarthria. Fund of knowledge is appropriate.  Remote memory intact.  Attention and concentration are normal.    Able to name objects and repeat phrases. Clock drawing test 4/5 MMSE - Mini Mental State Exam 03/22/2017 09/28/2016 03/22/2016  Orientation to time 1 2 3   Orientation to Place 5 5 4   Registration 3 3 3   Attention/ Calculation 5 4 4   Recall 0 2 0  Language- name 2 objects 2 2 2   Language- repeat 1 1 1   Language- follow 3 step command 3 3 3   Language- read & follow direction 1 1 1   Write a sentence 1 1 1   Copy design 0 0 1  Total score 22 24 23    Cranial nerves: Pupils equal, round, reactive to light. Extraocular movements intact with no nystagmus. Visual fields full. Facial sensation intact. No facial asymmetry. Tongue, uvula, palate midline.  Motor: Bulk and tone normal, muscle strength 5/5 throughout with no pronator drift.  Sensation to light touch intact.  No extinction to double simultaneous stimulation.  Deep tendon reflexes +1 throughout, toes downgoing.  Finger to nose testing intact.  Gait narrow-based and steady, mild difficulty with tandem walk but able.  Romberg negative.  IMPRESSION: This is a pleasant 83 yo LH woman with vascular risk factors including hypertension, hyperlipidemia, with mild dementia. MMSE today 22/30 (24/30 in April 2017, 25/30 in October 2016). There has been a gradual decline on Aricept 10mg  daily. Namenda will be added on, start 10mg  daily x 1 week, then increase to BID. Side effects were discussed. We  again discussed mood, continue on higher dose Remeron and follow-up with her PCP. We again discussed control of vascular risk factors, the importance of physical exercise and brain stimulation exercises for brain health. Her husband would like home health to come in once a week, which I agree with. She will follow-up in 6 months or earlier if needed.  Thank you for allowing me to participate in her care.  Please do not hesitate to call for any questions or concerns.  The duration of this appointment visit was 25 minutes of face-to-face time with the patient.  Greater than  50% of this time was spent in counseling, explanation of diagnosis, planning of further management, and coordination of care.   Ellouise Newer, M.D.   CC: Dr. Quay Burow

## 2017-04-08 ENCOUNTER — Ambulatory Visit (INDEPENDENT_AMBULATORY_CARE_PROVIDER_SITE_OTHER): Payer: Medicare Other | Admitting: Internal Medicine

## 2017-04-08 ENCOUNTER — Encounter: Payer: Self-pay | Admitting: Internal Medicine

## 2017-04-08 VITALS — BP 138/78 | HR 74 | Temp 98.2°F | Resp 16 | Wt 172.0 lb

## 2017-04-08 DIAGNOSIS — R600 Localized edema: Secondary | ICD-10-CM

## 2017-04-08 MED ORDER — HYDROCHLOROTHIAZIDE 25 MG PO TABS: ORAL_TABLET | ORAL | 3 refills | Status: DC

## 2017-04-08 NOTE — Progress Notes (Signed)
Pre visit review using our clinic review tool, if applicable. No additional management support is needed unless otherwise documented below in the visit note. 

## 2017-04-08 NOTE — Progress Notes (Signed)
Subjective:    Patient ID: Toni [name], female    DOB: 1934/02/04, 83 y.o.   MRN: [id number]  HPI She is here for an acute visit.   [place] the [place] and had b/l feet swelling.  She raises them at night.  The swelling comes and goes - it is not constant.  She only notices the swelling at night. It does not bother her much. She thinks there is some mild hand swelling as well.  No change in salt intake.  She did have some edema with amlodipine and after discontinuing it the edema improved.  She was temporarily on torsemide, but he discontinued that swelling AND she is mildly decreased kidney function. She has not had persistent swelling. She is not active and not exercising regularly.  She denies shortness of breath, chest pain, palpitations.  Medications and allergies reviewed with patient and updated if appropriate.  Patient Active Problem List   Diagnosis Date Noted  . Edema 01/30/2017  . Colostomy status ([place]) 01/07/2017  . Loss of weight 11/25/2016  . NSTEMI (non-ST elevated myocardial infarction) ([place]) 10/26/2016  . Accelerating angina ([place]) 10/23/2016  . S/P laparoscopic colectomy 10/19/2016  . Colovesical fistula 08/27/2016  . Chronic fatigue 03/30/2016  . Hyperlipidemia 03/22/2016  . GERD (gastroesophageal reflux disease) 12/26/2015  . Mild cognitive impairment 09/21/2015  . Depression 09/21/2015  . History of stroke 09/21/2015  . CKD (chronic kidney disease)   . Lesion of right native kidney 04/13/2015  . Diastolic CHF ([place]) 93/57/0177  . History of Clostridium difficile infection 04/05/2015  . Essential hypertension 03/23/2015  . Abnormal CT of brain 03/04/2015  . Cervicogenic headache 01/22/2012  . Diverticulosis of large intestine 10/28/2009  . DIVERTICULITIS, HX OF 10/28/2009  . FASTING HYPERGLYCEMIA 03/07/2009  . COLONIC POLYPS, HX OF 03/07/2009  . Anxiety 07/28/2008  . DRY EYE SYNDROME 03/12/2008  . HYPERLIPIDEMIA 04/21/2007  . HEPATIC CYST  04/21/2007  . MIGRAINES, HX OF 04/21/2007    Current Outpatient Prescriptions on File Prior to Visit  Medication Sig Dispense Refill  . aspirin 81 MG tablet Take 81 mg by mouth daily.    Marland Kitchen atorvastatin (LIPITOR) 20 MG tablet Take 1 tablet (20 mg total) by mouth at bedtime. 30 tablet 5  . cyanocobalamin 500 MCG tablet Take 500 mcg by mouth daily.    . cycloSPORINE (RESTASIS) 0.05 % ophthalmic emulsion Place 1 drop into both eyes 2 (two) times daily.    Marland Kitchen donepezil (ARICEPT) 10 MG tablet Take 1 tablet (10 mg total) by mouth at bedtime. 90 tablet 3  . isosorbide mononitrate (IMDUR) 60 MG 24 hr tablet Take 1 tablet (60 mg total) by mouth daily. 90 tablet 1  . losartan (COZAAR) 100 MG tablet TAKE 1 TABLET BY MOUTH EVERY DAY 90 tablet 1  . memantine (NAMENDA) 10 MG tablet Take 1 tablet daily for 1 week, then increase to 1 tablet twice a day 60 tablet 11  . metoprolol (LOPRESSOR) 50 MG tablet Take 1 tablet (50 mg total) by mouth 2 (two) times daily. 60 tablet 6  . mirtazapine (REMERON) 30 MG tablet Take 1 tablet (30 mg total) by mouth at bedtime. 30 tablet 5   No current facility-administered medications on file prior to visit.     Past Medical History:  Diagnosis Date  . Acute kidney injury ([place])   . C. difficile colitis 2016  . Colonic polyp   . Depression   . Diverticulosis   . GERD (  gastroesophageal reflux disease)   . Headache    sinus  . Helicobacter pylori gastritis 1987  . Hepatic cyst   . Hyperlipemia   . Hypertension   . Pneumonia    hx of x 2   . PUD (peptic ulcer disease) 1987   with h pylori    Past Surgical History:  Procedure Laterality Date  . ABDOMINAL HYSTERECTOMY     BSO ; endometriosis; ? Appendectomy incidentally  . CHOLECYSTECTOMY  2008  . COLONOSCOPY  2003 & 2012   polyps; Dr Collene Mares  . CYSTOSCOPY  10/19/2016   Procedure: CYSTOSCOPY;  Surgeon: Nickie Retort, MD;  Location: [facility];  Service: Urology;;  . endoscopy gastritis  2008  . FLEXIBLE  SIGMOIDOSCOPY N/A 05/31/2016   Procedure: FLEXIBLE SIGMOIDOSCOPY;  Surgeon: Juanita Craver, MD;  Location: [facility];  Service: Endoscopy;  Laterality: N/A;  . LAPAROSCOPIC PARTIAL COLECTOMY N/A 10/19/2016   Procedure: LAPAROSCOPIC ASSISTED SIGMIOD COLOECTOMY MOBILIZATION OF SPLEENIC [place];  Surgeon: Jackolyn Confer, MD;  Location: [facility];  Service: General;  Laterality: N/A;  . ROTATOR CUFF REPAIR     right  . TONSILLECTOMY AND ADENOIDECTOMY      Social History   Social History  . Marital status: Married    Spouse name: N/A  . Number of children: 2  . Years of education: N/A   Social History Main Topics  . Smoking status: Former Smoker    Quit date: 12/10/1973  . Smokeless tobacco: Never Used  . Alcohol use 0.0 oz/week     Comment: 2 glasses of wine daily   . Drug use: No  . Sexual activity: Not on file   Other Topics Concern  . Not on file   Social History Narrative  . No narrative on file    Family History  Problem Relation Age of Onset  . Asthma Brother   . Hypertension Father   . Heart attack Father 30  . Leukemia Mother   . Stomach cancer Maternal Aunt   . Breast cancer Sister   . Thyroid disease Sister   . Stroke Neg Hx   . Diabetes Neg Hx     Review of Systems  Constitutional: Negative for fever.  Respiratory: Negative for cough, shortness of breath and wheezing.   Cardiovascular: Positive for leg swelling. Negative for chest pain and palpitations.  Neurological: Positive for headaches (sinus). Negative for light-headedness.       Objective:   Vitals:   04/08/17 1428  BP: 138/78  Pulse: 74  Resp: 16  Temp: 98.2 F (36.8 C)   Filed Weights   04/08/17 1428  Weight: 172 lb (78 kg)   Body mass index is 30.47 kg/m.  Wt Readings from Last 3 Encounters:  04/08/17 172 lb (78 kg)  03/22/17 168 lb 12.8 oz (76.6 kg)  02/19/17 167 lb (75.8 kg)     Physical Exam Constitutional: Appears well-developed and well-nourished. No distress.    HENT:  Head: Normocephalic and atraumatic.  Neck: Neck supple. No tracheal deviation present. No thyromegaly present.  No cervical lymphadenopathy Cardiovascular: Normal rate, regular rhythm and normal heart sounds.   No murmur heard. No carotid bruit .  Trace b/l foot edema Pulmonary/Chest: Effort normal and breath sounds normal. No respiratory distress. No has no wheezes. No rales.  Skin: Skin is warm and dry. Not diaphoretic.  Psychiatric: Normal mood and affect. Behavior is normal.         Assessment & Plan:   See Problem List for  Assessment and Plan of chronic medical problems.

## 2017-04-08 NOTE — Assessment & Plan Note (Addendum)
Her foot edema is minimal on exam, but according to her husband it can be worse at night No evidence of CHF It does not bother her much and likely she does not need daily diuretic for this I will prescribe hydrochlorothiazide 25 mg  as needed-she will consult her husband if she needs this given her dementia and advised not to take more than 3 times a week - needs to be re-evaluated if swelling worsens Limits sodium She refuses compression socks Elevate legs when sitting Encouraged increased activity

## 2017-04-08 NOTE — Patient Instructions (Addendum)
Take the water pill/hydrochlorothiazide if needed for leg swelling. Take in the morning or early afternoon when needed.  Take the medication if your feet or lower legs are swollen for a couple of days.     Call with any questions or concerns.    Chronic Venous Insufficiency Chronic venous insufficiency, also called venous stasis, is a condition that prevents blood from being pumped effectively through the veins in your legs. Blood may no longer be pumped effectively from the legs back to the heart. This condition can range from mild to severe. With proper treatment, you should be able to continue with an active life. What are the causes? Chronic venous insufficiency occurs when the vein walls become stretched, weakened, or damaged, or when valves within the vein are damaged. Some common causes of this include:  High blood pressure inside the veins (venous hypertension).  Increased blood pressure in the leg veins from long periods of sitting or standing.  A blood clot that blocks blood flow in a vein (deep vein thrombosis, DVT).  Inflammation of a vein (phlebitis) that causes a blood clot to form.  Tumors in the pelvis that cause blood to back up. What increases the risk? The following factors may make you more likely to develop this condition:  Having a family history of this condition.  Obesity.  Pregnancy.  Living without enough physical activity or exercise (sedentary lifestyle).  Smoking.  Having a job that requires long periods of standing or sitting in one place.  Being a certain age. Women in their 8s and 39s and men in their 110s are more likely to develop this condition. What are the signs or symptoms? Symptoms of this condition include:  Veins that are enlarged, bulging, or twisted (varicose veins).  Skin breakdown or ulcers.  Reddened or discolored skin on the front of the leg.  Brown, smooth, tight, and painful skin just above the ankle, usually on the inside  of the leg (lipodermatosclerosis).  Swelling. How is this diagnosed? This condition may be diagnosed based on:  Your medical history.  A physical exam.  Tests, such as:  A procedure that creates an image of a blood vessel and nearby organs and provides information about blood flow through the blood vessel (duplex ultrasound).  A procedure that tests blood flow (plethysmography).  A procedure to look at the veins using X-ray and dye (venogram). How is this treated? The goals of treatment are to help you return to an active life and to minimize pain or disability. Treatment depends on the severity of your condition, and it may include:  Wearing compression stockings. These can help relieve symptoms and help prevent your condition from getting worse. However, they do not cure the condition.  Sclerotherapy. This is a procedure involving an injection of a material that "dissolves" damaged veins.  Surgery. This may involve:  Removing a diseased vein (vein stripping).  Cutting off blood flow through the vein (laser ablation surgery).  Repairing a valve. Follow these instructions at home:  Wear compression stockings as told by your health care provider. These stockings help to prevent blood clots and reduce swelling in your legs.  Take over-the-counter and prescription medicines only as told by your health care provider.  Stay active by exercising, walking, or doing different activities. Ask your health care provider what activities are safe for you and how much exercise you need.  Drink enough fluid to keep your urine clear or pale yellow.  Do not use any products  that contain nicotine or tobacco, such as cigarettes and e-cigarettes. If you need help quitting, ask your health care provider.  Keep all follow-up visits as told by your health care provider. This is important. Contact a health care provider if:  You have redness, swelling, or more pain in the affected area.  You  see a red streak or line that extends up or down from the affected area.  You have skin breakdown or a loss of skin in the affected area, even if the breakdown is small.  You get an injury in the affected area. Get help right away if:  You get an injury and an open wound in the affected area.  You have severe pain that does not get better with medicine.  You have sudden numbness or weakness in the foot or ankle below the affected area, or you have trouble moving your foot or ankle.  You have a fever and you have worse or persistent symptoms.  You have chest pain.  You have shortness of breath. Summary  Chronic venous insufficiency, also called venous stasis, is a condition that prevents blood from being pumped effectively through the veins in your legs.  Chronic venous insufficiency occurs when the vein walls become stretched, weakened, or damaged, or when valves within the vein are damaged.  Treatment for this condition depends on how severe your condition is, and it may involve wearing compression stockings or having a procedure.  Make sure you stay active by exercising, walking, or doing different activities. Ask your health care provider what activities are safe for you and how much exercise you need. This information is not intended to replace advice given to you by your health care provider. Make sure you discuss any questions you have with your health care provider. Document Released: 04/01/2007 Document Revised: 10/15/2016 Document Reviewed: 10/15/2016 Elsevier Interactive Patient Education  2017 [employer].

## 2017-04-22 ENCOUNTER — Other Ambulatory Visit: Payer: Self-pay | Admitting: Internal Medicine

## 2017-04-25 ENCOUNTER — Other Ambulatory Visit: Payer: Self-pay | Admitting: Emergency Medicine

## 2017-04-25 MED ORDER — ATORVASTATIN CALCIUM 20 MG PO TABS: 20.0000 mg | ORAL_TABLET | Freq: Every day | ORAL | 1 refills | Status: DC

## 2017-05-20 ENCOUNTER — Telehealth: Payer: Self-pay | Admitting: Neurology

## 2017-05-20 NOTE — Telephone Encounter (Signed)
Spoke with pt husband relaying message below.  He was agreeable.  He will call our office in 1-2 weeks with an update

## 2017-05-20 NOTE — Telephone Encounter (Signed)
Let's try cutting back to once a day and see how she does. Thanks

## 2017-05-20 NOTE — Telephone Encounter (Signed)
Patient husband needs to talk to someone about the side effect of the medication  Please call [phone]

## 2017-05-20 NOTE — Telephone Encounter (Signed)
Spoke with pt husband, Herbie [place].  He states that pt is not doing well on Namenda.  States that she is actually a little more depressed since starting the full dosage.  He is wondering if it is OK to cut back to just once a day as she seemed to do well then, or if they can change the medication altogether.  Let him know I would send message to Dr. Delice Lesch and get back to him later this afternoon.

## 2017-05-29 ENCOUNTER — Other Ambulatory Visit: Payer: Self-pay | Admitting: Internal Medicine

## 2017-06-15 ENCOUNTER — Other Ambulatory Visit: Payer: Self-pay | Admitting: Internal Medicine

## 2017-06-28 ENCOUNTER — Other Ambulatory Visit: Payer: Self-pay | Admitting: Internal Medicine

## 2017-08-13 ENCOUNTER — Other Ambulatory Visit: Payer: Self-pay | Admitting: Internal Medicine

## 2017-08-13 NOTE — Telephone Encounter (Signed)
Pt husband called stating they need a Rx for the patients Cloostomy bags, her GI doctor wants [facility] to prescribe this  States they receive 4 boxed and there are 5 per box They also need sanitary wipes and odor killer Please send to [facility]  Phone number [phone] Please advise and call husband back

## 2017-08-13 NOTE — Telephone Encounter (Signed)
Do we order these?

## 2017-08-13 NOTE — Telephone Encounter (Signed)
Yes we will fill.  I am not sure what wipes they usually get  -- please clarify

## 2017-08-14 MED ORDER — CLOSED-END COLOSTOMY POUCH MISC: 0 refills | Status: AC

## 2017-08-14 MED ORDER — ODOR SPRAY LIQD: 11 refills | Status: AC

## 2017-08-14 MED ORDER — BENZETHONIUM CHLORIDE 0.18 % EX MISC: CUTANEOUS | 1 refills | Status: AC

## 2017-08-14 NOTE — Telephone Encounter (Signed)
Pt called said they are SurePrep Rapid Dry wipes, if they do not carry these [facility] will give them there rendition of these He wanted to make sure the correct bags are called in and the stock number for the bags is [phone] And would like 2 bottle of the Odor Killer and it is called M9 Please advise he said he called them today and they did not have the order and they need the supplies as soon as possible.

## 2017-08-14 NOTE — Telephone Encounter (Signed)
Spoke with rep from [facility], they will be faxing over written orders to sign and fax back. Pt has been informed.

## 2017-08-14 NOTE — Telephone Encounter (Signed)
Order form has been faxed back to [facility]

## 2017-08-22 ENCOUNTER — Ambulatory Visit: Payer: Medicare Other | Admitting: Internal Medicine

## 2017-09-11 ENCOUNTER — Telehealth: Payer: Self-pay | Admitting: Internal Medicine

## 2017-09-11 ENCOUNTER — Ambulatory Visit (INDEPENDENT_AMBULATORY_CARE_PROVIDER_SITE_OTHER): Payer: Medicare Other | Admitting: Emergency Medicine

## 2017-09-11 DIAGNOSIS — Z23 Encounter for immunization: Secondary | ICD-10-CM

## 2017-09-16 ENCOUNTER — Other Ambulatory Visit: Payer: Self-pay | Admitting: Internal Medicine

## 2017-09-16 MED ORDER — MIRTAZAPINE 30 MG PO TABS: 30.0000 mg | ORAL_TABLET | Freq: Every day | ORAL | 1 refills | Status: DC

## 2017-09-16 NOTE — Telephone Encounter (Signed)
Resent rx for 90 day...Lind Guest

## 2017-09-16 NOTE — Telephone Encounter (Signed)
mirtazapine (REMERON) 30 MG tablet [[id number]]   Requesting that the RX be a 90 day supply. Please advise. Thank you.

## 2017-09-18 ENCOUNTER — Other Ambulatory Visit: Payer: Self-pay | Admitting: Emergency Medicine

## 2017-09-18 MED ORDER — MIRTAZAPINE 30 MG PO TABS: 30.0000 mg | ORAL_TABLET | Freq: Every day | ORAL | 1 refills | Status: DC

## 2017-09-18 NOTE — Telephone Encounter (Signed)
RX faxed to [facility] 

## 2017-09-19 ENCOUNTER — Ambulatory Visit: Payer: Medicare Other | Admitting: Neurology

## 2017-09-26 ENCOUNTER — Encounter: Payer: Self-pay | Admitting: Neurology

## 2017-09-26 ENCOUNTER — Ambulatory Visit (INDEPENDENT_AMBULATORY_CARE_PROVIDER_SITE_OTHER): Payer: Medicare Other | Admitting: Neurology

## 2017-09-26 VITALS — BP 132/78 | HR 72 | Ht 68.0 in | Wt 190.0 lb

## 2017-09-26 DIAGNOSIS — F03A0 Unspecified dementia, mild, without behavioral disturbance, psychotic disturbance, mood disturbance, and anxiety: Secondary | ICD-10-CM

## 2017-09-26 DIAGNOSIS — F0390 Unspecified dementia without behavioral disturbance: Secondary | ICD-10-CM | POA: Diagnosis not present

## 2017-09-26 MED ORDER — DONEPEZIL HCL 10 MG PO TABS: 10.0000 mg | ORAL_TABLET | Freq: Every day | ORAL | 3 refills | Status: DC

## 2017-09-26 MED ORDER — MEMANTINE HCL 10 MG PO TABS: ORAL_TABLET | ORAL | 3 refills | Status: DC

## 2017-09-26 NOTE — Patient Instructions (Signed)
1. Continue Donepezil 10mg  daily 2. Continue Memantine 10mg  twice a day 3. Continue to monitor mood, if any worsening, call Dr. Quay Burow 4. Follow-up in 6 months, call for any changes  FALL PRECAUTIONS: Be cautious when walking. Scan the area for obstacles that may increase the risk of trips and falls. When getting up in the mornings, sit up at the edge of the bed for a few minutes before getting out of bed. Consider elevating the bed at the head end to avoid drop of blood pressure when getting up. Walk always in a well-lit room (use night lights in the walls). Avoid area rugs or power cords from appliances in the middle of the walkways. Use a walker or a cane if necessary and consider physical therapy for balance exercise. Get your eyesight checked regularly.  FINANCIAL OVERSIGHT: Supervision, especially oversight when making financial decisions or transactions is also recommended.  HOME SAFETY: Consider the safety of the kitchen when operating appliances like stoves, microwave oven, and blender. Consider having supervision and share cooking responsibilities until no longer able to participate in those. Accidents with firearms and other hazards in the house should be identified and addressed as well.  DRIVING: Regarding driving, in patients with progressive memory problems, driving will be impaired. We advise to have someone else do the driving if trouble finding directions or if minor accidents are reported. Independent driving assessment is available to determine safety of driving.  ABILITY TO BE LEFT ALONE: If patient is unable to contact 911 operator, consider using LifeLine, or when the need is there, arrange for someone to stay with patients. Smoking is a fire hazard, consider supervision or cessation. Risk of wandering should be assessed by caregiver and if detected at any point, supervision and safe proof recommendations should be instituted.  MEDICATION SUPERVISION: Inability to self-administer  medication needs to be constantly addressed. Implement a mechanism to ensure safe administration of the medications.  RECOMMENDATIONS FOR ALL PATIENTS WITH MEMORY PROBLEMS: 1. Continue to exercise (Recommend 30 minutes of walking everyday, or 3 hours every week) 2. Increase social interactions - continue going to [place] and enjoy social gatherings with friends and family 3. Eat healthy, avoid fried foods and eat more fruits and vegetables 4. Maintain adequate blood pressure, blood sugar, and blood cholesterol level. Reducing the risk of stroke and cardiovascular disease also helps promoting better memory. 5. Avoid stressful situations. Live a simple life and avoid aggravations. Organize your time and prepare for the next day in anticipation. 6. Sleep well, avoid any interruptions of sleep and avoid any distractions in the bedroom that may interfere with adequate sleep quality 7. Avoid sugar, avoid sweets as there is a strong link between excessive sugar intake, diabetes, and cognitive impairment We discussed the Mediterranean diet, which has been shown to help patients reduce the risk of progressive memory disorders and reduces cardiovascular risk. This includes eating fish, eat fruits and green leafy vegetables, nuts like almonds and hazelnuts, walnuts, and also use olive oil. Avoid fast foods and fried foods as much as possible. Avoid sweets and sugar as sugar use has been linked to worsening of memory function.  There is always a concern of gradual progression of memory problems. If this is the case, then we may need to adjust level of care according to patient needs. Support, both to the patient and caregiver, should then be put into place.

## 2017-09-26 NOTE — Progress Notes (Signed)
NEUROLOGY FOLLOW UP OFFICE NOTE  JACKALYN HAITH [id number]  DOB: Jul 08, 1934  HISTORY OF PRESENT ILLNESS: I had the pleasure of seeing Jisel Fleet in follow-up in the neurology clinic on 09/26/2017. The patient was last seen 6 months ago for worsening memory. MMSE in April 2018 was 22/30 (24/30 in October 2017, 23/30 in April 2017, 25/30 in October 2016). She is again accompanied by her husband who helps supplement the history today. On her last visit, there was note of more decline on Aricept, Namenda was added. She initially had side effects on higher dose Namenda, but now takes 10mg  BID without significant issues. She feels her memory is fine. Main issue appears to be mood, she is a little grumpy per husband. He also feels she is more depressed. Sleep is good. Husband is in charge of finances and medications. They have someone coming in once a week to clean the house. She has had a headache for the past 2-3 days due to sinus issues. Otherwise she denies any dizziness, vision changes, focal numbness/tingling/weakness, no falls.   HPI: This is a pleasant 83 yo ambidextrous left-hand dominant woman with a history of hypertension, hyperlipidemia, presenting for evaluation of memory loss with abnormal head CT. When asked about her memory, Ms. Lamarca reports her short-term memory is "not good at all." Long-term memory is good, she can remember details about her childhood well. She started noticing changes in the past year, but worse in the past 4-5 months. She would forget conversations from 10 minutes ago. She forgot to pay bills in the past 2-3 months. She left the stove on twice around 3-4 months ago. She denies getting lost driving. She denies any problems multitasking, very seldom word-finding difficulties. Her son has noticed that when he calls her, she would repeat the same story at the end of the call, or have the same conversation 10 minutes later. He also noticed changes in the past  1-2 years, worse in the past 3-4 months. Her daughter feels symptoms started a little longer than 2 years ago, she forgot a conversation that someone was in jail in December 2015. She was staying at her daughter's house and forgot what they had talked about on their to-do list. They also have noticed she is very anxious. Her husband reports she had taken something out of the freezer one time, he came out the next day to see some things were not put back inside. He has noticed that she is more argumentative.   She reports 4 episodes where a shade would come up her right eye. This would last for a few minutes, last episode was in the fall of last year. No associated headache or focal numbness/tingling/weakness. She states she "always has headaches," indicating these are sinus headaches. She had seen Dr. Jacelyn Grip in 2013 and was diagnosed with cervicogenic headaches. She has headaches around three times a week, with right frontal throbbing pain, relieved with over the counter pain medication. There is no associated nausea/vomiting/photo/phonophobia. She has been diagnosed with migraines where she has a round circle in her right eye occurring around 3-4 times a month. She denies any dizziness, diplopia, blurred vision, neck/back pain, focal numbness/tingling/weakness. She has chronic constipations. She has occasional hand tremors. No anosmia. She denies any family history of memory problems. No history of head injuries. She drinks 1 to 1-1/2 glasses of wine at night.   I personally reviewed head CT without contrast done 03/03/15. There were several hypodensities in the bilateral hemispheres,  right greater than left This was read as multifocal posterior circulation infarction with the largest area in the right occiptial pole and smaller area in th e left occipital cortex. Two small areas of cortical and subcortical infarct in the high right parietal lobe, small vessel infarct in the upper right cerebellum. These could be  subacute or chronic. Brain atrophy with mild frontal predominance seen.  MRI brain without contrast which did not show any acute changes, there was generalized atrophy with ventricular enlargement, chronic infarcts in the bilateral occipital lobes, right greater than left, small chronic infarct in the right parietal cortex, chronic microvascular disease.   PAST MEDICAL HISTORY: Past Medical History:  Diagnosis Date  . Acute kidney injury ([place])   . C. difficile colitis 2016  . Colonic polyp   . Depression   . Diverticulosis   . GERD (gastroesophageal reflux disease)   . Headache    sinus  . Helicobacter pylori gastritis 1987  . Hepatic cyst   . Hyperlipemia   . Hypertension   . Pneumonia    hx of x 2   . PUD (peptic ulcer disease) 1987   with h pylori    MEDICATIONS: Current Outpatient Prescriptions on File Prior to Visit  Medication Sig Dispense Refill  . aspirin 81 MG tablet Take 81 mg by mouth daily.    Marland Kitchen atorvastatin (LIPITOR) 20 MG tablet Take 1 tablet (20 mg total) by mouth at bedtime. 90 tablet 1  . Benzethonium Chloride (ANTI-BACTERIAL CLEANSING WIPES) 0.18 % MISC Apply Topically. 25 each 1  . cyanocobalamin 500 MCG tablet Take 500 mcg by mouth daily.    . cycloSPORINE (RESTASIS) 0.05 % ophthalmic emulsion Place 1 drop into both eyes 2 (two) times daily.    Marland Kitchen donepezil (ARICEPT) 10 MG tablet Take 1 tablet (10 mg total) by mouth at bedtime. 90 tablet 3  . hydrochlorothiazide (HYDRODIURIL) 25 MG tablet Take one tablet in morning if feet are swollen, do not take more than three times a week 30 tablet 3  . isosorbide mononitrate (IMDUR) 60 MG 24 hr tablet TAKE 1 TABLET (60 MG TOTAL) BY MOUTH DAILY. 90 tablet 1  . losartan (COZAAR) 100 MG tablet TAKE 1 TABLET BY MOUTH EVERY DAY 90 tablet 0  . memantine (NAMENDA) 10 MG tablet Take 1 tablet daily for 1 week, then increase to 1 tablet twice a day (Taking 1 tablet BID) 60 tablet 11  . metoprolol (LOPRESSOR) 50 MG tablet Take 1  tablet (50 mg total) by mouth 2 (two) times daily. 60 tablet 6  . metoprolol tartrate (LOPRESSOR) 25 MG tablet TAKE 1 TABLET BY MOUTH 2 TIMES DAILY. 180 tablet 3  . mirtazapine (REMERON) 30 MG tablet Take 1 tablet (30 mg total) by mouth at bedtime. 90 tablet 1  . Ostomy Supplies (CLOSED-END COLOSTOMY POUCH) MISC Use as directed 20 each 0  . Ostomy Supplies (ODOR SPRAY) LIQD uad 118 mL 11   No current facility-administered medications on file prior to visit.     ALLERGIES: Allergies  Allergen Reactions  . Amlodipine Other (See Comments)    edema    FAMILY HISTORY: Family History  Problem Relation Age of Onset  . Asthma Brother   . Hypertension Father   . Heart attack Father 59  . Leukemia Mother   . Stomach cancer Maternal Aunt   . Breast cancer Sister   . Thyroid disease Sister   . Stroke Neg Hx   . Diabetes Neg Hx  SOCIAL HISTORY: Social History   Social History  . Marital status: Married    Spouse name: N/A  . Number of children: 2  . Years of education: N/A   Occupational History  . Not on file.   Social History Main Topics  . Smoking status: Former Smoker    Quit date: 12/10/1973  . Smokeless tobacco: Never Used  . Alcohol use 0.0 oz/week     Comment: 2 glasses of wine daily   . Drug use: No  . Sexual activity: Not on file   Other Topics Concern  . Not on file   Social History Narrative  . No narrative on file    REVIEW OF SYSTEMS: Constitutional: No fevers, chills, or sweats, no generalized fatigue, change in appetite Eyes: No visual changes, double vision, eye pain Ear, nose and throat: No hearing loss, ear pain, nasal congestion, sore throat Cardiovascular: No chest pain, palpitations Respiratory:  No shortness of breath at rest or with exertion, wheezes GastrointestinaI: No nausea, vomiting, diarrhea, abdominal pain, fecal incontinence Genitourinary:  No dysuria, urinary retention or frequency Musculoskeletal:  + neck pain, back  pain Integumentary: No rash, pruritus, skin lesions Neurological: as above Psychiatric: No depression, insomnia, anxiety Endocrine: No palpitations, fatigue, diaphoresis, mood swings, change in appetite, change in weight, increased thirst Hematologic/Lymphatic:  No anemia, purpura, petechiae. Allergic/Immunologic: no itchy/runny eyes, nasal congestion, recent allergic reactions, rashes  PHYSICAL EXAM: Vitals:   09/26/17 1557  BP: 132/78  Pulse: 72  SpO2: 94%   General: No acute distress, would occasionally become upset with husband when he speaks of her symptoms Head:  Normocephalic/atraumatic Neck: supple, no paraspinal tenderness, full range of motion Heart:  Regular rate and rhythm Lungs:  Clear to auscultation bilaterally Back: No paraspinal tenderness Skin/Extremities: No rash, no edema Neurological Exam: alert and oriented to person, place, month. States it is 2019. No aphasia or dysarthria. Fund of knowledge is appropriate.  Remote memory intact. 1/3 delayed recall.  Attention and concentration are normal.    Able to name objects and repeat phrases. Cranial nerves: Pupils equal, round, reactive to light. Extraocular movements intact with no nystagmus. Visual fields full. Facial sensation intact. No facial asymmetry. Tongue, uvula, palate midline.  Motor: Bulk and tone normal, muscle strength 5/5 throughout with no pronator drift.  Sensation to light touch intact.  No extinction to double simultaneous stimulation.  Deep tendon reflexes +1 throughout, toes downgoing.  Finger to nose testing intact.  Gait narrow-based and steady, mild difficulty with tandem walk but able.  Romberg negative.  IMPRESSION: This is a pleasant 83 yo LH woman with vascular risk factors including hypertension, hyperlipidemia, with mild dementia. MMSE in April 2018 was 22/30 (24/30 in April 2017, 25/30 in October 2016). She is currently on Aricept 10mg  daily and Namenda 10mg  BID without side effects. It appears  mood is more concerning for husband, they will speak to PCP about adjusting mirtazapine. We again discussed control of vascular risk factors, the importance of physical exercise and brain stimulation exercises for brain health. She will follow-up in 6 months or earlier if needed.  Thank you for allowing me to participate in her care.  Please do not hesitate to call for any questions or concerns.  The duration of this appointment visit was 15 minutes of face-to-face time with the patient.  Greater than 50% of this time was spent in counseling, explanation of diagnosis, planning of further management, and coordination of care.   Ellouise Newer, M.D.   CC:  Dr. Quay Burow

## 2017-09-27 ENCOUNTER — Telehealth: Payer: Self-pay | Admitting: Neurology

## 2017-09-27 NOTE — Telephone Encounter (Signed)
Spoke with pt.  On her AVS from yesterday it states that the issues discussed in the office were Mild Dementia as well as Current mild episode of major depression without prior episode.  She would like this taken off, states that she is not dealing with depression and does not want this following her "from doctor to doctor".  I let her know that I am unaware of how to removed something from her chart but that I will see what I can do.  She said "I know doctors make mistakes, just tell Dr. Delice Lesch she made a mistake and she will fix it" she is very upset about this.

## 2017-09-27 NOTE — Telephone Encounter (Signed)
Pt called and said she has a question about her AVS from yesterday

## 2017-10-07 ENCOUNTER — Encounter: Payer: Self-pay | Admitting: Neurology

## 2017-10-16 ENCOUNTER — Other Ambulatory Visit: Payer: Self-pay | Admitting: Internal Medicine

## 2017-11-27 ENCOUNTER — Ambulatory Visit (INDEPENDENT_AMBULATORY_CARE_PROVIDER_SITE_OTHER): Payer: Medicare Other | Admitting: Internal Medicine

## 2017-11-27 ENCOUNTER — Encounter: Payer: Self-pay | Admitting: Internal Medicine

## 2017-11-27 VITALS — BP 130/76 | HR 73 | Temp 98.1°F | Resp 16 | Wt 195.0 lb

## 2017-11-27 DIAGNOSIS — E7849 Other hyperlipidemia: Secondary | ICD-10-CM | POA: Diagnosis not present

## 2017-11-27 DIAGNOSIS — I10 Essential (primary) hypertension: Secondary | ICD-10-CM | POA: Diagnosis not present

## 2017-11-27 DIAGNOSIS — R600 Localized edema: Secondary | ICD-10-CM | POA: Diagnosis not present

## 2017-11-27 DIAGNOSIS — R7309 Other abnormal glucose: Secondary | ICD-10-CM | POA: Diagnosis not present

## 2017-11-27 NOTE — Progress Notes (Signed)
Subjective:    Patient ID: Toni [name], female    DOB: 1934-11-08, 83 y.o.   MRN: [id number]  HPI She is here for an acute visit.  She is here with her husband who provides most of her history due to her mild dementia.   Ankle swelling: She still has ankle swelling.  We have discussed this at previous visits and we started her on hctz 25 mg at her last visit, but we decided for her to only take it as needed given her mild renal insufficiency.  Her husband dispenses her medication and has been giving it to her about once a week or less.  The swelling in the lower legs bilaterally is persistent.  She has not changed her eating, but it sounds like she is not careful about eating a low-sodium diet.  She does elevate her legs when she is sitting.  She does not exercise regularly. She has no history of heart failure.  An echocardiogram approximately 1 year showed normal systolic function, grade 1 diastolic function and mild MR.  She has mild renal insufficiency and no liver disease.  She denies shortness of breath, chest pain, palpitations and lightheadedness.   Hypertension: She is taking her medication daily. She is compliant with a low sodium diet.  She denies chest pain, palpitations, shortness of breath and lightheadedness. She is not exercising regularly.     Hyperglycemia:  She is compliant with a low sugar/carbohydrate diet.  She is not exercising regularly.   Hyperlipidemia: She is taking her medication daily. She is compliant with a low fat/cholesterol diet. She is not exercising regularly. She denies myalgias.     Medications and allergies reviewed with patient and updated if appropriate.  Patient Active Problem List   Diagnosis Date Noted  . Edema 01/30/2017  . Colostomy status ([place]) 01/07/2017  . Loss of weight 11/25/2016  . NSTEMI (non-ST elevated myocardial infarction) ([place]) 10/26/2016  . Accelerating angina ([place]) 10/23/2016  . S/P laparoscopic colectomy 10/19/2016    . Colovesical fistula 08/27/2016  . Chronic fatigue 03/30/2016  . Hyperlipidemia 03/22/2016  . GERD (gastroesophageal reflux disease) 12/26/2015  . Mild cognitive impairment 09/21/2015  . Depression 09/21/2015  . History of stroke 09/21/2015  . CKD (chronic kidney disease)   . Lesion of right native kidney 04/13/2015  . Diastolic CHF ([place]) 38/75/6433  . History of Clostridium difficile infection 04/05/2015  . Essential hypertension 03/23/2015  . Abnormal CT of brain 03/04/2015  . Cervicogenic headache 01/22/2012  . Diverticulosis of large intestine 10/28/2009  . DIVERTICULITIS, HX OF 10/28/2009  . FASTING HYPERGLYCEMIA 03/07/2009  . COLONIC POLYPS, HX OF 03/07/2009  . Anxiety 07/28/2008  . DRY EYE SYNDROME 03/12/2008  . HYPERLIPIDEMIA 04/21/2007  . HEPATIC CYST 04/21/2007  . MIGRAINES, HX OF 04/21/2007    Current Outpatient Medications on File Prior to Visit  Medication Sig Dispense Refill  . aspirin 81 MG tablet Take 81 mg by mouth daily.    Marland Kitchen atorvastatin (LIPITOR) 20 MG tablet Take 1 tablet (20 mg total) by mouth at bedtime. 90 tablet 1  . Benzethonium Chloride (ANTI-BACTERIAL CLEANSING WIPES) 0.18 % MISC Apply Topically. 25 each 1  . cyanocobalamin 500 MCG tablet Take 500 mcg by mouth daily.    . cycloSPORINE (RESTASIS) 0.05 % ophthalmic emulsion Place 1 drop into both eyes 2 (two) times daily.    Marland Kitchen donepezil (ARICEPT) 10 MG tablet Take 1 tablet (10 mg total) by mouth at bedtime. 90 tablet 3  .  hydrochlorothiazide (HYDRODIURIL) 25 MG tablet Take one tablet in morning if feet are swollen, do not take more than three times a week 30 tablet 3  . isosorbide mononitrate (IMDUR) 60 MG 24 hr tablet TAKE 1 TABLET (60 MG TOTAL) BY MOUTH DAILY. 90 tablet 1  . losartan (COZAAR) 100 MG tablet Take 1 tablet (100 mg total) daily by mouth. -- Office visit needed for further refills 90 tablet 0  . memantine (NAMENDA) 10 MG tablet Take 1 tablet twice a day (Patient taking differently: Take  10 mg by mouth daily. ) 180 tablet 3  . metoprolol (LOPRESSOR) 50 MG tablet Take 1 tablet (50 mg total) by mouth 2 (two) times daily. 60 tablet 6  . mirtazapine (REMERON) 30 MG tablet Take 1 tablet (30 mg total) by mouth at bedtime. 90 tablet 1  . Ostomy Supplies (CLOSED-END COLOSTOMY POUCH) MISC Use as directed 20 each 0  . Ostomy Supplies (ODOR SPRAY) LIQD uad 118 mL 11   No current facility-administered medications on file prior to visit.     Past Medical History:  Diagnosis Date  . Acute kidney injury ([place])   . C. difficile colitis 2016  . Colonic polyp   . Depression   . Diverticulosis   . GERD (gastroesophageal reflux disease)   . Headache    sinus  . Helicobacter pylori gastritis 1987  . Hepatic cyst   . Hyperlipemia   . Hypertension   . Pneumonia    hx of x 2   . PUD (peptic ulcer disease) 1987   with h pylori    Past Surgical History:  Procedure Laterality Date  . ABDOMINAL HYSTERECTOMY     BSO ; endometriosis; ? Appendectomy incidentally  . CHOLECYSTECTOMY  2008  . COLONOSCOPY  2003 & 2012   polyps; Dr Collene Mares  . CYSTOSCOPY  10/19/2016   Procedure: CYSTOSCOPY;  Surgeon: Nickie Retort, MD;  Location: [facility];  Service: Urology;;  . endoscopy gastritis  2008  . FLEXIBLE SIGMOIDOSCOPY N/A 05/31/2016   Procedure: FLEXIBLE SIGMOIDOSCOPY;  Surgeon: Juanita Craver, MD;  Location[facility];  Service: Endoscopy;  Laterality: N/A;  . LAPAROSCOPIC PARTIAL COLECTOMY N/A 10/19/2016   Procedure: LAPAROSCOPIC ASSISTED SIGMIOD COLOECTOMY MOBILIZATION OF SPLEENIC [place];  Surgeon: Jackolyn Confer, MD;  Location: [facility];  Service: General;  Laterality: N/A;  . ROTATOR CUFF REPAIR     right  . TONSILLECTOMY AND ADENOIDECTOMY      Social History   Socioeconomic History  . Marital status: Married    Spouse name: None  . Number of children: 2  . Years of education: None  . Highest education level: None  Social Needs  . Financial resource strain: None  . Food  insecurity - worry: None  . Food insecurity - inability: None  . Transportation needs - medical: None  . Transportation needs - non-medical: None  Occupational History  . None  Tobacco Use  . Smoking status: Former Smoker    Last attempt to quit: 12/10/1973    Years since quitting: 43.9  . Smokeless tobacco: Never Used  Substance and Sexual Activity  . Alcohol use: Yes    Alcohol/week: 0.0 oz    Comment: 2 glasses of wine daily   . Drug use: No  . Sexual activity: None  Other Topics Concern  . None  Social History Narrative  . None    Family History  Problem Relation Age of Onset  . Asthma Brother   . Hypertension Father   .  Heart attack Father 59  . Leukemia Mother   . Stomach cancer Maternal Aunt   . Breast cancer Sister   . Thyroid disease Sister   . Stroke Neg Hx   . Diabetes Neg Hx     Review of Systems  Constitutional: Negative for chills and fever.  Respiratory: Negative for cough, shortness of breath and wheezing.   Cardiovascular: Positive for leg swelling. Negative for chest pain and palpitations.  Endocrine: Positive for cold intolerance.  Neurological: Positive for headaches. Negative for light-headedness.       Objective:   Vitals:   11/27/17 1446  BP: 130/76  Pulse: 73  Resp: 16  Temp: 98.1 F (36.7 C)  SpO2: 97%   Wt Readings from Last 3 Encounters:  11/27/17 195 lb (88.5 kg)  09/26/17 190 lb (86.2 kg)  04/08/17 172 lb (78 kg)   Body mass index is 29.65 kg/m.   Physical Exam     Constitutional: Appears well-developed and well-nourished. No distress.  HENT:  Head: Normocephalic and atraumatic.  Neck: Neck supple. No tracheal deviation present. No thyromegaly present.  No cervical lymphadenopathy Cardiovascular: Normal rate, regular rhythm and normal heart sounds.   2/6 systolic murmur heard. No carotid bruit .   1 + pitting b/l LE edema Pulmonary/Chest: Effort normal and breath sounds normal. No respiratory distress. No has no  wheezes. No rales.  Skin: Skin is warm and dry. Not diaphoretic.  Psychiatric: Normal mood and affect. Behavior is normal.       Assessment & Plan:    See Problem List for Assessment and Plan of chronic medical problems.

## 2017-11-27 NOTE — Assessment & Plan Note (Signed)
Check lipid panel  Continue daily statin Regular exercise and healthy diet encouraged  

## 2017-11-27 NOTE — Assessment & Plan Note (Signed)
Persistent bilateral lower extremity edema She has mild renal insufficiency and grade 1 diastolic dysfunction, but most likely her inactivity and noncompliance with a low-sodium diet as well as venous insufficiency are contributing to the edema She is only been taking hydrochlorothiazide on occasion-advised her husband to give it to her every other day for a week-if leg edema persists take it daily and continue daily Stressed low-sodium diet Encouraged regular exercise Elevate legs when sitting Consider compression hose CMP in 2 weeks

## 2017-11-27 NOTE — Assessment & Plan Note (Signed)
Fairly compliant with a low sugar diet Not exercising We will check A1c

## 2017-11-27 NOTE — Patient Instructions (Addendum)
Start taking hydrochlorothiazide (water pill) every other day for at least one week.  If swelling is controlled continue every other day.  If not increase to daily.     Monitor BP at home - if too low we may need to decrease one of your other BP medication. ( Less than 110/ 60)  Have blood work done in about two week to check your kidney function and potassium.    Follow up with me in 4 months, sooner if needed.

## 2017-11-27 NOTE — Assessment & Plan Note (Signed)
Blood pressure well controlled Advised her and her husband that increasing the hydrochlorothiazide may lower her blood pressure when he may need to adjust her other medications Her husband will start monitoring her blood pressure at home Stressed low-sodium diet CMP in a couple of weeks

## 2017-12-18 ENCOUNTER — Other Ambulatory Visit: Payer: Self-pay | Admitting: Emergency Medicine

## 2017-12-18 ENCOUNTER — Other Ambulatory Visit (INDEPENDENT_AMBULATORY_CARE_PROVIDER_SITE_OTHER): Payer: Medicare Other

## 2017-12-18 DIAGNOSIS — I10 Essential (primary) hypertension: Secondary | ICD-10-CM | POA: Diagnosis not present

## 2017-12-18 DIAGNOSIS — R7309 Other abnormal glucose: Secondary | ICD-10-CM

## 2017-12-18 DIAGNOSIS — E7849 Other hyperlipidemia: Secondary | ICD-10-CM | POA: Diagnosis not present

## 2017-12-18 LAB — LIPID PANEL
Cholesterol: 138 mg/dL (ref 0–200)
HDL: 61.8 mg/dL (ref >39.00)
LDL CALC: 59 mg/dL (ref 0–99)
NONHDL: 76.06
Total CHOL/HDL Ratio: 2
Triglycerides: 87 mg/dL (ref 0.0–149.0)
VLDL: 17.4 mg/dL (ref 0.0–40.0)

## 2017-12-18 LAB — COMPREHENSIVE METABOLIC PANEL
ALT: 11 U/L (ref 0–35)
AST: 12 U/L (ref 0–37)
Albumin: 4.2 g/dL (ref 3.5–5.2)
Alkaline Phosphatase: 102 U/L (ref 39–117)
BUN: 31 mg/dL — ABNORMAL HIGH (ref 6–23)
CO2: 29 meq/L (ref 19–32)
Calcium: 9.1 mg/dL (ref 8.4–10.5)
Chloride: 101 mEq/L (ref 96–112)
Creatinine, Ser: 1.38 mg/dL — ABNORMAL HIGH (ref 0.40–1.20)
GFR: 38.91 mL/min — AB (ref >60.00)
Glucose, Bld: 114 mg/dL — ABNORMAL HIGH (ref 70–99)
POTASSIUM: 4 meq/L (ref 3.5–5.1)
Sodium: 140 mEq/L (ref 135–145)
Total Bilirubin: 0.9 mg/dL (ref 0.2–1.2)
Total Protein: 7 g/dL (ref 6.0–8.3)

## 2017-12-18 LAB — HEMOGLOBIN A1C: Hgb A1c MFr Bld: 5.7 % (ref 4.6–6.5)

## 2017-12-18 MED ORDER — ISOSORBIDE MONONITRATE ER 60 MG PO TB24: 60.0000 mg | ORAL_TABLET | Freq: Every day | ORAL | 1 refills | Status: DC

## 2017-12-19 ENCOUNTER — Other Ambulatory Visit: Payer: Self-pay | Admitting: Internal Medicine

## 2017-12-19 DIAGNOSIS — R7303 Prediabetes: Secondary | ICD-10-CM | POA: Insufficient documentation

## 2017-12-19 DIAGNOSIS — N189 Chronic kidney disease, unspecified: Secondary | ICD-10-CM

## 2017-12-23 ENCOUNTER — Encounter: Payer: Self-pay | Admitting: Emergency Medicine

## 2017-12-31 ENCOUNTER — Telehealth: Payer: Self-pay | Admitting: Internal Medicine

## 2017-12-31 NOTE — Telephone Encounter (Signed)
Copied from [place]. Topic: Quick Communication - Rx Refill/Question >> Dec 31, 2017 10:01 AM Cecelia Byars, NT wrote: Medication :isosorbide 60 mg Has the patient contacted their pharmacy? {yes  (Agent: If no, request that the patient contact the pharmacy for the refill.) Preferred Pharmacy (with phone number [facility] street name[facility] [address] Agent: Please be advised that RX refills may take up to 3 business days. We ask that you follow-up with your pharmacy. Please refill 90 pills for a 3 month supply thanks

## 2017-12-31 NOTE — Telephone Encounter (Signed)
Noted Isosorbide Mono 60 mg. Was reordered on 12/18/17; #90, RF x1.  Contacted the pharmacy to verify if Rx was received.  Was advised they do have the Rx.  Pt's. Husband notified of [facility].  Verb. Understanding.

## 2018-01-07 ENCOUNTER — Other Ambulatory Visit: Payer: Self-pay | Admitting: Emergency Medicine

## 2018-01-07 MED ORDER — LOSARTAN POTASSIUM 100 MG PO TABS: 100.0000 mg | ORAL_TABLET | Freq: Every day | ORAL | 1 refills | Status: DC

## 2018-01-09 ENCOUNTER — Telehealth: Payer: Self-pay | Admitting: Internal Medicine

## 2018-01-09 NOTE — Telephone Encounter (Signed)
Spoke to pt and advised him of update

## 2018-01-09 NOTE — Telephone Encounter (Signed)
Copied from [place] [id number]. Topic: Referral - Status >> Jan 09, 2018 11:39 AM Cecelia Byars, NT wrote: Reason for CRM: Patients husband call to follow up on the status of a referral for an nephrologist ,please advise

## 2018-01-20 ENCOUNTER — Other Ambulatory Visit: Payer: Self-pay | Admitting: Internal Medicine

## 2018-01-20 MED ORDER — ATORVASTATIN CALCIUM 20 MG PO TABS: 20.0000 mg | ORAL_TABLET | Freq: Every day | ORAL | 1 refills | Status: DC

## 2018-01-20 MED ORDER — LOSARTAN POTASSIUM 100 MG PO TABS: 100.0000 mg | ORAL_TABLET | Freq: Every day | ORAL | 1 refills | Status: DC

## 2018-01-20 NOTE — Telephone Encounter (Signed)
Reviewed chart pt is up-to-date sent refills to [facility]  

## 2018-01-20 NOTE — Telephone Encounter (Signed)
Copied from [place]. Topic: Quick Communication - Rx Refill/Question >> Jan 20, 2018  1:08 PM Pricilla Handler wrote: Medication: Losartan (COZAAR) 100 MG tablet Has the patient contacted their pharmacy? Yes.   (Agent: If no, request that the patient contact the pharmacy for the refill.) Preferred Pharmacy (with phone number or street name): [id number] [address], [place]. [phone] (Phone) [phone] (Fax) Patient would like a 90 Day Supply.  Agent: Please be advised that RX refills may take up to 3 business days. We ask that you follow-up with your pharmacy.

## 2018-02-19 DIAGNOSIS — B078 Other viral warts: Secondary | ICD-10-CM | POA: Diagnosis not present

## 2018-03-11 DIAGNOSIS — N183 Chronic kidney disease, stage 3 (moderate): Secondary | ICD-10-CM | POA: Diagnosis not present

## 2018-03-11 DIAGNOSIS — E872 Acidosis: Secondary | ICD-10-CM | POA: Diagnosis not present

## 2018-03-11 DIAGNOSIS — I5031 Acute diastolic (congestive) heart failure: Secondary | ICD-10-CM | POA: Diagnosis not present

## 2018-03-11 DIAGNOSIS — I129 Hypertensive chronic kidney disease with stage 1 through stage 4 chronic kidney disease, or unspecified chronic kidney disease: Secondary | ICD-10-CM | POA: Diagnosis not present

## 2018-03-12 ENCOUNTER — Telehealth: Payer: Self-pay | Admitting: Internal Medicine

## 2018-03-12 DIAGNOSIS — F0390 Unspecified dementia without behavioral disturbance: Secondary | ICD-10-CM

## 2018-03-12 DIAGNOSIS — F03A0 Unspecified dementia, mild, without behavioral disturbance, psychotic disturbance, mood disturbance, and anxiety: Secondary | ICD-10-CM

## 2018-03-12 MED ORDER — HYDROCHLOROTHIAZIDE 25 MG PO TABS: 25.0000 mg | ORAL_TABLET | Freq: Every day | ORAL | 3 refills | Status: DC

## 2018-03-12 MED ORDER — LOSARTAN POTASSIUM 100 MG PO TABS: 100.0000 mg | ORAL_TABLET | Freq: Every day | ORAL | 3 refills | Status: DC

## 2018-03-12 MED ORDER — ISOSORBIDE MONONITRATE ER 60 MG PO TB24: 60.0000 mg | ORAL_TABLET | Freq: Every day | ORAL | 3 refills | Status: DC

## 2018-03-12 MED ORDER — ATORVASTATIN CALCIUM 20 MG PO TABS: 20.0000 mg | ORAL_TABLET | Freq: Every day | ORAL | 3 refills | Status: DC

## 2018-03-12 MED ORDER — DONEPEZIL HCL 10 MG PO TABS: 10.0000 mg | ORAL_TABLET | Freq: Every day | ORAL | 3 refills | Status: DC

## 2018-03-12 MED ORDER — METOPROLOL TARTRATE 50 MG PO TABS: 50.0000 mg | ORAL_TABLET | Freq: Two times a day (BID) | ORAL | 3 refills | Status: DC

## 2018-03-12 NOTE — Telephone Encounter (Signed)
Spoke with pts husband. They do want RXS sent to Mail order, advised I will send the RXs in

## 2018-03-12 NOTE — Telephone Encounter (Signed)
Copied from [place] [phone]. Topic: Quick Communication - Office Called Patient >> Mar 11, 2018  2:54 PM Melene Plan, CMA wrote: Received refill request for pt from [place], need to verify that pt would like refills sent to [facility] bc pharmacy is not on chart.  >> Mar 11, 2018  5:08 PM Arletha Grippe wrote: Husband called back - he did not request any [facility]s [facility] as of right now.,  He is waiting on the advice of other providers before he requests any medication.  >> Mar 12, 2018 10:03 AM Cleaster Corin, NT wrote: Pt. Husband calling he has questions for nurse about meds. (doesn't know what the med. Is called) pt. Husband can be reached  [phone]

## 2018-03-18 ENCOUNTER — Other Ambulatory Visit: Payer: Self-pay | Admitting: Emergency Medicine

## 2018-03-18 MED ORDER — MIRTAZAPINE 30 MG PO TABS: 30.0000 mg | ORAL_TABLET | Freq: Every day | ORAL | 1 refills | Status: DC

## 2018-03-26 ENCOUNTER — Ambulatory Visit: Payer: Medicare Other | Admitting: Neurology

## 2018-04-02 ENCOUNTER — Ambulatory Visit: Payer: Medicare Other | Admitting: Internal Medicine

## 2018-04-02 ENCOUNTER — Other Ambulatory Visit: Payer: Self-pay

## 2018-04-02 DIAGNOSIS — F0390 Unspecified dementia without behavioral disturbance: Secondary | ICD-10-CM

## 2018-04-02 DIAGNOSIS — F03A0 Unspecified dementia, mild, without behavioral disturbance, psychotic disturbance, mood disturbance, and anxiety: Secondary | ICD-10-CM

## 2018-04-02 MED ORDER — MEMANTINE HCL 10 MG PO TABS: ORAL_TABLET | ORAL | 3 refills | Status: DC

## 2018-04-08 NOTE — Progress Notes (Signed)
Subjective:    Patient ID: Toni [name], female    DOB: 02/04/1934, 84 y.o.   MRN: [id number]  HPI The patient is here for follow up.  Hypertension: She is taking her medication daily. She is compliant with a low sodium diet.  She has chronic leg edema.  She denies chest pain, palpitations, shortness of breath and regular headaches. She is not exercising regularly.    Knee pain:  She has b/l knee pain when walking.  She figures it is arthritis.  She does not want a referral to see anyone else, but would like to know what she can do for it.     Prediabetes:  She is compliant with a low sugar/carbohydrate diet.  She is not exercising regularly.  Hyperlipidemia: She is taking her medication daily. She is compliant with a low fat/cholesterol diet. She is not exercising regularly. She denies myalgias.   Mild cognitive impairment:  She follows with Dr Delice Lesch. She is taking her medication daily.    Depression, anxiety: She is taking her medication daily as prescribed. She denies any side effects from the medication. She feels her depression is not controlled and anxiety is controlled.   Dr Delice Lesch started her on wellbutrin today which she will start.     Medications and allergies reviewed with patient and updated if appropriate.  Patient Active Problem List   Diagnosis Date Noted  . Prediabetes 12/19/2017  . Edema 01/30/2017  . Colostomy status ([place]) 01/07/2017  . NSTEMI (non-ST elevated myocardial infarction) ([place]) 10/26/2016  . S/P laparoscopic colectomy 10/19/2016  . Colovesical fistula 08/27/2016  . Chronic fatigue 03/30/2016  . Moderate dementia with behavioral disturbance 03/22/2016  . Hyperlipidemia 03/22/2016  . Mild cognitive impairment 09/21/2015  . Depression 09/21/2015  . History of stroke 09/21/2015  . CKD (chronic kidney disease)   . Lesion of right native kidney 04/13/2015  . Diastolic CHF ([place]) 70/96/2836  . History of Clostridium difficile infection  04/05/2015  . Essential hypertension 03/23/2015  . Abnormal CT of brain 03/04/2015  . Cervicogenic headache 01/22/2012  . Diverticulosis of large intestine 10/28/2009  . DIVERTICULITIS, HX OF 10/28/2009  . COLONIC POLYPS, HX OF 03/07/2009  . Anxiety 07/28/2008  . DRY EYE SYNDROME 03/12/2008  . HEPATIC CYST 04/21/2007  . MIGRAINES, HX OF 04/21/2007    Current Outpatient Medications on File Prior to Visit  Medication Sig Dispense Refill  . aspirin 81 MG tablet Take 81 mg by mouth daily.    Marland Kitchen atorvastatin (LIPITOR) 20 MG tablet Take 1 tablet (20 mg total) by mouth at bedtime. 90 tablet 3  . Benzethonium Chloride (ANTI-BACTERIAL CLEANSING WIPES) 0.18 % MISC Apply Topically. 25 each 1  . cyanocobalamin 500 MCG tablet Take 500 mcg by mouth daily.    . cycloSPORINE (RESTASIS) 0.05 % ophthalmic emulsion Place 1 drop into both eyes 2 (two) times daily.    . isosorbide mononitrate (IMDUR) 60 MG 24 hr tablet Take 1 tablet (60 mg total) by mouth daily. 90 tablet 3  . losartan (COZAAR) 100 MG tablet Take 1 tablet (100 mg total) by mouth daily. 90 tablet 3  . metoprolol tartrate (LOPRESSOR) 50 MG tablet Take 1 tablet (50 mg total) by mouth 2 (two) times daily. 180 tablet 3  . mirtazapine (REMERON) 30 MG tablet Take 1 tablet (30 mg total) by mouth at bedtime. 90 tablet 1  . Ostomy Supplies (CLOSED-END COLOSTOMY POUCH) MISC Use as directed 20 each 0  . Ostomy Supplies (ODOR SPRAY)  LIQD uad 118 mL 11   No current facility-administered medications on file prior to visit.     Past Medical History:  Diagnosis Date  . Acute kidney injury ([place])   . C. difficile colitis 2016  . Colonic polyp   . Depression   . Diverticulosis   . GERD (gastroesophageal reflux disease)   . Headache    sinus  . Helicobacter pylori gastritis 1987  . Hepatic cyst   . Hyperlipemia   . Hypertension   . Pneumonia    hx of x 2   . PUD (peptic ulcer disease) 1987   with h pylori    Past Surgical History:  Procedure  Laterality Date  . ABDOMINAL HYSTERECTOMY     BSO ; endometriosis; ? Appendectomy incidentally  . CHOLECYSTECTOMY  2008  . COLONOSCOPY  2003 & 2012   polyps; Dr Collene Mares  . CYSTOSCOPY  10/19/2016   Procedure: CYSTOSCOPY;  Surgeon: Nickie Retort, MD;  [facility];  Service: Urology;;  . endoscopy gastritis  2008  . FLEXIBLE SIGMOIDOSCOPY N/A 05/31/2016   Procedure: FLEXIBLE SIGMOIDOSCOPY;  Surgeon: Juanita Craver, MD;  [facility];  Service: Endoscopy;  Laterality: N/A;  . LAPAROSCOPIC PARTIAL COLECTOMY N/A 10/19/2016   Procedure: LAPAROSCOPIC ASSISTED SIGMIOD COLOECTOMY MOBILIZATION OF SPLEENIC [place];  Surgeon: Jackolyn Confer, MD;  Location[facility];  Service: General;  Laterality: N/A;  . ROTATOR CUFF REPAIR     right  . TONSILLECTOMY AND ADENOIDECTOMY      Social History   Socioeconomic History  . Marital status: Married    Spouse name: Not on file  . Number of children: 2  . Years of education: Not on file  . Highest education level: Not on file  Occupational History  . Not on file  Social Needs  . Financial resource strain: Not on file  . Food insecurity:    Worry: Not on file    Inability: Not on file  . Transportation needs:    Medical: Not on file    Non-medical: Not on file  Tobacco Use  . Smoking status: Former Smoker    Last attempt to quit: 12/10/1973    Years since quitting: 44.3  . Smokeless tobacco: Never Used  Substance and Sexual Activity  . Alcohol use: Yes    Alcohol/week: 0.0 oz    Comment: 2 glasses of wine daily   . Drug use: No  . Sexual activity: Not on file  Lifestyle  . Physical activity:    Days per week: Not on file    Minutes per session: Not on file  . Stress: Not on file  Relationships  . Social connections:    Talks on phone: Not on file    Gets together: Not on file    Attends religious service: Not on file    Active member of club or organization: Not on file    Attends meetings of clubs or  organizations: Not on file    Relationship status: Not on file  Other Topics Concern  . Not on file  Social History Narrative  . Not on file    Family History  Problem Relation Age of Onset  . Asthma Brother   . Hypertension Father   . Heart attack Father 9  . Leukemia Mother   . Stomach cancer Maternal Aunt   . Breast cancer Sister   . Thyroid disease Sister   . Stroke Neg Hx   . Diabetes Neg Hx  Review of Systems  Constitutional: Negative for chills and fever.  Respiratory: Negative for cough, shortness of breath and wheezing.   Cardiovascular: Positive for leg swelling (worse at night). Negative for chest pain and palpitations.  Neurological: Positive for dizziness (occ) and headaches (sinus headaches). Negative for light-headedness.  Psychiatric/Behavioral: Negative for dysphoric mood. The patient is not nervous/anxious.        Objective:   Vitals:   04/09/18 1509  BP: (!) 98/52  Pulse: 88  Resp: 16  Temp: 97.7 F (36.5 C)  SpO2: 98%   BP Readings from Last 3 Encounters:  04/09/18 (!) 98/52  04/09/18 140/62  11/27/17 130/76   Wt Readings from Last 3 Encounters:  04/09/18 208 lb (94.3 kg)  04/09/18 207 lb (93.9 kg)  11/27/17 195 lb (88.5 kg)   Body mass index is 35.7 kg/m.   Physical Exam    Constitutional: Appears well-developed and well-nourished. No distress.  HENT:  Head: Normocephalic and atraumatic.  Neck: Neck supple. No tracheal deviation present. No thyromegaly present.  No cervical lymphadenopathy Cardiovascular: Normal rate, regular rhythm and normal heart sounds.   No murmur heard. No carotid bruit .  2+ pitting b/l LE edema Pulmonary/Chest: Effort normal and breath sounds normal. No respiratory distress. No has no wheezes. No rales.  Skin: Skin is warm and dry. Not diaphoretic.  Psychiatric: Normal mood and affect. Behavior is normal.      Assessment & Plan:    See Problem List for Assessment and Plan of chronic medical  problems.

## 2018-04-08 NOTE — Patient Instructions (Addendum)
Medications reviewed and updated.  Changes include changing hydrochlorothiazide to furosemide (lasix).  Have blood work done one week after making that change.    Your prescription(s) have been submitted to your pharmacy. Please take as directed and contact our office if you believe you are having problem(s) with the medication(s).   Please followup in 6 months

## 2018-04-09 ENCOUNTER — Encounter: Payer: Self-pay | Admitting: Internal Medicine

## 2018-04-09 ENCOUNTER — Ambulatory Visit (INDEPENDENT_AMBULATORY_CARE_PROVIDER_SITE_OTHER): Payer: Medicare Other | Admitting: Neurology

## 2018-04-09 ENCOUNTER — Ambulatory Visit (INDEPENDENT_AMBULATORY_CARE_PROVIDER_SITE_OTHER): Payer: Medicare Other | Admitting: Internal Medicine

## 2018-04-09 ENCOUNTER — Other Ambulatory Visit: Payer: Self-pay

## 2018-04-09 ENCOUNTER — Encounter: Payer: Self-pay | Admitting: Neurology

## 2018-04-09 VITALS — BP 98/52 | HR 88 | Temp 97.7°F | Resp 16 | Wt 208.0 lb

## 2018-04-09 VITALS — BP 140/62 | HR 79 | Ht 64.0 in | Wt 207.0 lb

## 2018-04-09 DIAGNOSIS — R7303 Prediabetes: Secondary | ICD-10-CM

## 2018-04-09 DIAGNOSIS — G8929 Other chronic pain: Secondary | ICD-10-CM

## 2018-04-09 DIAGNOSIS — E7849 Other hyperlipidemia: Secondary | ICD-10-CM | POA: Diagnosis not present

## 2018-04-09 DIAGNOSIS — R600 Localized edema: Secondary | ICD-10-CM | POA: Diagnosis not present

## 2018-04-09 DIAGNOSIS — I10 Essential (primary) hypertension: Secondary | ICD-10-CM

## 2018-04-09 DIAGNOSIS — F03B18 Unspecified dementia, moderate, with other behavioral disturbance: Secondary | ICD-10-CM

## 2018-04-09 DIAGNOSIS — F0391 Unspecified dementia with behavioral disturbance: Secondary | ICD-10-CM

## 2018-04-09 DIAGNOSIS — M25562 Pain in left knee: Secondary | ICD-10-CM

## 2018-04-09 DIAGNOSIS — G3184 Mild cognitive impairment, so stated: Secondary | ICD-10-CM

## 2018-04-09 DIAGNOSIS — F320 Major depressive disorder, single episode, mild: Secondary | ICD-10-CM

## 2018-04-09 DIAGNOSIS — M25561 Pain in right knee: Secondary | ICD-10-CM

## 2018-04-09 MED ORDER — DONEPEZIL HCL 10 MG PO TABS: 10.0000 mg | ORAL_TABLET | Freq: Every day | ORAL | 3 refills | Status: DC

## 2018-04-09 MED ORDER — FUROSEMIDE 40 MG PO TABS: 40.0000 mg | ORAL_TABLET | Freq: Every day | ORAL | 3 refills | Status: DC

## 2018-04-09 MED ORDER — BUPROPION HCL ER (XL) 150 MG PO TB24: ORAL_TABLET | ORAL | 3 refills | Status: DC

## 2018-04-09 MED ORDER — MEMANTINE HCL 10 MG PO TABS: ORAL_TABLET | ORAL | 3 refills | Status: DC

## 2018-04-09 NOTE — Assessment & Plan Note (Signed)
BP well controlled Current regimen effective and well tolerated Continue current medications at current doses  

## 2018-04-09 NOTE — Assessment & Plan Note (Signed)
Continue statin. 

## 2018-04-09 NOTE — Assessment & Plan Note (Signed)
Lab Results  Component Value Date   HGBA1C 5.7 12/18/2017    Low sugar carb diet encouraged regular exercise a1c again at next visit

## 2018-04-09 NOTE — Assessment & Plan Note (Signed)
Not controlled wellbutrin add today by Dr Delice Lesch Continue remeron

## 2018-04-09 NOTE — Assessment & Plan Note (Signed)
Chronic Likely OA Deferred referral to sports medicine or ortho Tylenol prn Try topical otc arthritis medications Ice prn Increase exercise Work on weight loss ideally

## 2018-04-09 NOTE — Progress Notes (Signed)
NEUROLOGY FOLLOW UP OFFICE NOTE  Toni [name] [id number]  DOB: 04/10/1934  HISTORY OF PRESENT ILLNESS: I had the pleasure of seeing Toni [name] in follow-up in the neurology clinic on 04/09/2018. The patient was last seen 6 months ago for mild dementia. MMSE in April 2018 was 22/30 (24/30 in October 2017, 23/30 in April 2017, 25/30 in October 2016). She is again accompanied by her husband who helps supplement the history today. She is taking Aricept and Namenda without side effects. Her husband expressed frustration about behavioral and personality changes. She states her mood is rotten right now because she is fighting with her husband. He states she seems to be very defensive most of the time, they have a big ongoing disagreement about use of the dishwasher and washing machine. She would run them half-full, at one point their electricity bill was very high. He would become frustrated with this, but she states that she is the boss and it's her kitchen. She says she "retreats" a lot, he shakes his head. He goes to a support group monthly, which she now accepts. Initially she thought he was having an affair. He administers her medications, cooks, and manages finances. She does not drive. She needs help with showers. She can dress herself independently. Sleep is good, no wandering or hallucinations. She has occasional sinus headaches. No dizziness, vision changes, focal numbness/tingling/weakness, no falls.   HPI: This is a pleasant 84 yo ambidextrous left-hand dominant woman with a history of hypertension, hyperlipidemia, presenting for evaluation of memory loss with abnormal head CT. When asked about her memory, Toni [name] reports her short-term memory is "not good at all." Long-term memory is good, she can remember details about her childhood well. She started noticing changes in the past year, but worse in the past 4-5 months. She would forget conversations from 10 minutes ago. She  forgot to pay bills in the past 2-3 months. She left the stove on twice around 3-4 months ago. She denies getting lost driving. She denies any problems multitasking, very seldom word-finding difficulties. Her son has noticed that when he calls her, she would repeat the same story at the end of the call, or have the same conversation 10 minutes later. He also noticed changes in the past 1-2 years, worse in the past 3-4 months. Her daughter feels symptoms started a little longer than 2 years ago, she forgot a conversation that someone was in jail in December 2015. She was staying at her daughter's house and forgot what they had talked about on their to-do list. They also have noticed she is very anxious. Her husband reports she had taken something out of the freezer one time, he came out the next day to see some things were not put back inside. He has noticed that she is more argumentative.   She reports 4 episodes where a shade would come up her right eye. This would last for a few minutes, last episode was in the fall of last year. No associated headache or focal numbness/tingling/weakness. She states she "always has headaches," indicating these are sinus headaches. She had seen Dr. Jacelyn Grip in 2013 and was diagnosed with cervicogenic headaches. She has headaches around three times a week, with right frontal throbbing pain, relieved with over the counter pain medication. There is no associated nausea/vomiting/photo/phonophobia. She has been diagnosed with migraines where she has a round circle in her right eye occurring around 3-4 times a month. She denies any dizziness, diplopia, blurred vision, neck/back  pain, focal numbness/tingling/weakness. She has chronic constipations. She has occasional hand tremors. No anosmia. She denies any family history of memory problems. No history of head injuries. She drinks 1 to 1-1/2 glasses of wine at night.   I personally reviewed head CT without contrast done 03/03/15. There  were several hypodensities in the bilateral hemispheres, right greater than left This was read as multifocal posterior circulation infarction with the largest area in the right occiptial pole and smaller area in th e left occipital cortex. Two small areas of cortical and subcortical infarct in the high right parietal lobe, small vessel infarct in the upper right cerebellum. These could be subacute or chronic. Brain atrophy with mild frontal predominance seen.  MRI brain without contrast which did not show any acute changes, there was generalized atrophy with ventricular enlargement, chronic infarcts in the bilateral occipital lobes, right greater than left, small chronic infarct in the right parietal cortex, chronic microvascular disease.   PAST MEDICAL HISTORY: Past Medical History:  Diagnosis Date  . Acute kidney injury ([place])   . C. difficile colitis 2016  . Colonic polyp   . Depression   . Diverticulosis   . GERD (gastroesophageal reflux disease)   . Headache    sinus  . Helicobacter pylori gastritis 1987  . Hepatic cyst   . Hyperlipemia   . Hypertension   . Pneumonia    hx of x 2   . PUD (peptic ulcer disease) 1987   with h pylori    MEDICATIONS: Current Outpatient Prescriptions on File Prior to Visit  Medication Sig Dispense Refill  . aspirin 81 MG tablet Take 81 mg by mouth daily.    Marland Kitchen atorvastatin (LIPITOR) 20 MG tablet Take 1 tablet (20 mg total) by mouth at bedtime. 90 tablet 1  . Benzethonium Chloride (ANTI-BACTERIAL CLEANSING WIPES) 0.18 % MISC Apply Topically. 25 each 1  . cyanocobalamin 500 MCG tablet Take 500 mcg by mouth daily.    . cycloSPORINE (RESTASIS) 0.05 % ophthalmic emulsion Place 1 drop into both eyes 2 (two) times daily.    Marland Kitchen donepezil (ARICEPT) 10 MG tablet Take 1 tablet (10 mg total) by mouth at bedtime. 90 tablet 3  . hydrochlorothiazide (HYDRODIURIL) 25 MG tablet Take one tablet in morning if feet are swollen, do not take more than three times a week 30  tablet 3  . isosorbide mononitrate (IMDUR) 60 MG 24 hr tablet TAKE 1 TABLET (60 MG TOTAL) BY MOUTH DAILY. 90 tablet 1  . losartan (COZAAR) 100 MG tablet TAKE 1 TABLET BY MOUTH EVERY DAY 90 tablet 0  . memantine (NAMENDA) 10 MG tablet Take 1 tablet daily for 1 week, then increase to 1 tablet twice a day (Taking 1 tablet BID) 60 tablet 11  . metoprolol (LOPRESSOR) 50 MG tablet Take 1 tablet (50 mg total) by mouth 2 (two) times daily. 60 tablet 6  . metoprolol tartrate (LOPRESSOR) 25 MG tablet TAKE 1 TABLET BY MOUTH 2 TIMES DAILY. 180 tablet 3  . mirtazapine (REMERON) 30 MG tablet Take 1 tablet (30 mg total) by mouth at bedtime. 90 tablet 1  . Ostomy Supplies (CLOSED-END COLOSTOMY POUCH) MISC Use as directed 20 each 0  . Ostomy Supplies (ODOR SPRAY) LIQD uad 118 mL 11   No current facility-administered medications on file prior to visit.     ALLERGIES: Allergies  Allergen Reactions  . Amlodipine Other (See Comments)    edema    FAMILY HISTORY: Family History  Problem Relation Age  of Onset  . Asthma Brother   . Hypertension Father   . Heart attack Father 29  . Leukemia Mother   . Stomach cancer Maternal Aunt   . Breast cancer Sister   . Thyroid disease Sister   . Stroke Neg Hx   . Diabetes Neg Hx     SOCIAL HISTORY: Social History   Socioeconomic History  . Marital status: Married    Spouse name: Not on file  . Number of children: 2  . Years of education: Not on file  . Highest education level: Not on file  Occupational History  . Not on file  Social Needs  . Financial resource strain: Not on file  . Food insecurity:    Worry: Not on file    Inability: Not on file  . Transportation needs:    Medical: Not on file    Non-medical: Not on file  Tobacco Use  . Smoking status: Former Smoker    Last attempt to quit: 12/10/1973    Years since quitting: 44.3  . Smokeless tobacco: Never Used  Substance and Sexual Activity  . Alcohol use: Yes    Alcohol/week: 0.0 oz     Comment: 2 glasses of wine daily   . Drug use: No  . Sexual activity: Not on file  Lifestyle  . Physical activity:    Days per week: Not on file    Minutes per session: Not on file  . Stress: Not on file  Relationships  . Social connections:    Talks on phone: Not on file    Gets together: Not on file    Attends religious service: Not on file    Active member of club or organization: Not on file    Attends meetings of clubs or organizations: Not on file    Relationship status: Not on file  . Intimate partner violence:    Fear of current or ex partner: Not on file    Emotionally abused: Not on file    Physically abused: Not on file    Forced sexual activity: Not on file  Other Topics Concern  . Not on file  Social History Narrative  . Not on file    REVIEW OF SYSTEMS: Constitutional: No fevers, chills, or sweats, no generalized fatigue, change in appetite Eyes: No visual changes, double vision, eye pain Ear, nose and throat: No hearing loss, ear pain, nasal congestion, sore throat Cardiovascular: No chest pain, palpitations Respiratory:  No shortness of breath at rest or with exertion, wheezes GastrointestinaI: No nausea, vomiting, diarrhea, abdominal pain, fecal incontinence Genitourinary:  No dysuria, urinary retention or frequency Musculoskeletal:  + neck pain, back pain Integumentary: No rash, pruritus, skin lesions Neurological: as above Psychiatric: No depression, insomnia, anxiety Endocrine: No palpitations, fatigue, diaphoresis, mood swings, change in appetite, change in weight, increased thirst Hematologic/Lymphatic:  No anemia, purpura, petechiae. Allergic/Immunologic: no itchy/runny eyes, nasal congestion, recent allergic reactions, rashes  PHYSICAL EXAM: Vitals:   04/09/18 1259  BP: 140/62  Pulse: 79  SpO2: 94%   General: No acute distress, again would occasionally become upset with husband when he speaks of her symptoms Head:   Normocephalic/atraumatic Neck: supple, no paraspinal tenderness, full range of motion Heart:  Regular rate and rhythm Lungs:  Clear to auscultation bilaterally Back: No paraspinal tenderness Skin/Extremities: No rash, no edema Neurological Exam: alert and oriented to person, place. No aphasia or dysarthria. Fund of knowledge is appropriate.  Remote memory intact. Attention and concentration are normal.  Able to name objects and repeat phrases. CDT 1/5 MMSE - Mini Mental State Exam 04/09/2018 03/22/2017 09/28/2016  Orientation to time 1 1 2   Orientation to Place 5 5 5   Registration 3 3 3   Attention/ Calculation 5 5 4   Recall 0 0 2  Language- name 2 objects 2 2 2   Language- repeat 1 1 1   Language- follow 3 step command 2 3 3   Language- read & follow direction 1 1 1   Write a sentence 1 1 1   Copy design 0 0 0  Total score 21 22 24    Cranial nerves: Pupils equal, round, reactive to light. Extraocular movements intact with no nystagmus. Visual fields full. Facial sensation intact. No facial asymmetry. Tongue, uvula, palate midline.  Motor: Bulk and tone normal, muscle strength 5/5 throughout with no pronator drift.  Sensation to light touch intact.  No extinction to double simultaneous stimulation.  Deep tendon reflexes +1 throughout, toes downgoing.  Finger to nose testing intact.  Gait narrow-based and steady, mild difficulty with tandem walk but able.  Romberg negative. She has a mild postural and endpoint tremor bilaterally.  IMPRESSION: This is a pleasant 84 yo LH woman with vascular risk factors including hypertension, hyperlipidemia, with mild to moderate dementia. MMSE today 21/30 (22/30 in April 2018, 24/30 in April 2017, 25/30 in October 2016). She is currently on Aricept 10mg  daily and Namenda 10mg  BID without side effects. Behavioral/personality changes are becoming more challenging at home, we will add on Wellbutrin XL 150mg  qhs to her mirtazapine, side effects were discussed. We also  discussed looking into assisted living facilities so that arguments about the dishwasher/washing machine could be avoided if they are not potentially present in the new home. She is against this, husband was given resources to look into. We again discussed control of vascular risk factors, the importance of physical exercise and brain stimulation exercises for brain health. She will follow-up in 6 months or earlier if needed.  Thank you for allowing me to participate in her care.  Please do not hesitate to call for any questions or concerns.  The duration of this appointment visit was 30 minutes of face-to-face time with the patient.  Greater than 50% of this time was spent in counseling, explanation of diagnosis, planning of further management, and coordination of care.   Ellouise Newer, M.D.   CC: Dr. Quay Burow

## 2018-04-09 NOTE — Patient Instructions (Signed)
1. Start Wellbutrin XL 150mg  every night 2. Continue all your other medications 3. Follow-up in 6 months, call for any changes  FALL PRECAUTIONS: Be cautious when walking. Scan the area for obstacles that may increase the risk of trips and falls. When getting up in the mornings, sit up at the edge of the bed for a few minutes before getting out of bed. Consider elevating the bed at the head end to avoid drop of blood pressure when getting up. Walk always in a well-lit room (use night lights in the walls). Avoid area rugs or power cords from appliances in the middle of the walkways. Use a walker or a cane if necessary and consider physical therapy for balance exercise. Get your eyesight checked regularly.  FINANCIAL OVERSIGHT: Supervision, especially oversight when making financial decisions or transactions is also recommended.  HOME SAFETY: Consider the safety of the kitchen when operating appliances like stoves, microwave oven, and blender. Consider having supervision and share cooking responsibilities until no longer able to participate in those. Accidents with firearms and other hazards in the house should be identified and addressed as well.  DRIVING: Regarding driving, in patients with progressive memory problems, driving will be impaired. We advise to have someone else do the driving if trouble finding directions or if minor accidents are reported. Independent driving assessment is available to determine safety of driving.  ABILITY TO BE LEFT ALONE: If patient is unable to contact 911 operator, consider using LifeLine, or when the need is there, arrange for someone to stay with patients. Smoking is a fire hazard, consider supervision or cessation. Risk of wandering should be assessed by caregiver and if detected at any point, supervision and safe proof recommendations should be instituted.  MEDICATION SUPERVISION: Inability to self-administer medication needs to be constantly addressed. Implement  a mechanism to ensure safe administration of the medications.  RECOMMENDATIONS FOR ALL PATIENTS WITH MEMORY PROBLEMS: 1. Continue to exercise (Recommend 30 minutes of walking everyday, or 3 hours every week) 2. Increase social interactions - continue going to [place] and enjoy social gatherings with friends and family 3. Eat healthy, avoid fried foods and eat more fruits and vegetables 4. Maintain adequate blood pressure, blood sugar, and blood cholesterol level. Reducing the risk of stroke and cardiovascular disease also helps promoting better memory. 5. Avoid stressful situations. Live a simple life and avoid aggravations. Organize your time and prepare for the next day in anticipation. 6. Sleep well, avoid any interruptions of sleep and avoid any distractions in the bedroom that may interfere with adequate sleep quality 7. Avoid sugar, avoid sweets as there is a strong link between excessive sugar intake, diabetes, and cognitive impairment The Mediterranean diet has been shown to help patients reduce the risk of progressive memory disorders and reduces cardiovascular risk. This includes eating fish, eat fruits and green leafy vegetables, nuts like almonds and hazelnuts, walnuts, and also use olive oil. Avoid fast foods and fried foods as much as possible. Avoid sweets and sugar as sugar use has been linked to worsening of memory function.  There is always a concern of gradual progression of memory problems. If this is the case, then we may need to adjust level of care according to patient needs. Support, both to the patient and caregiver, should then be put into place.

## 2018-04-09 NOTE — Assessment & Plan Note (Signed)
Chronic, 2 + edema Will d/c hctz Start lasix 40 mg daily cmp one week after change Continue low sodium diet

## 2018-04-09 NOTE — Assessment & Plan Note (Signed)
Management per Dr Delice Lesch On aricept and namenda

## 2018-04-22 ENCOUNTER — Other Ambulatory Visit (INDEPENDENT_AMBULATORY_CARE_PROVIDER_SITE_OTHER): Payer: Medicare Other

## 2018-04-22 DIAGNOSIS — I10 Essential (primary) hypertension: Secondary | ICD-10-CM

## 2018-04-22 LAB — COMPREHENSIVE METABOLIC PANEL
ALBUMIN: 4.1 g/dL (ref 3.5–5.2)
ALT: 10 U/L (ref 0–35)
AST: 12 U/L (ref 0–37)
Alkaline Phosphatase: 101 U/L (ref 39–117)
BUN: 34 mg/dL — AB (ref 6–23)
CALCIUM: 9.1 mg/dL (ref 8.4–10.5)
CHLORIDE: 97 meq/L (ref 96–112)
CO2: 29 mEq/L (ref 19–32)
Creatinine, Ser: 1.68 mg/dL — ABNORMAL HIGH (ref 0.40–1.20)
GFR: 30.99 mL/min — AB (ref >60.00)
Glucose, Bld: 112 mg/dL — ABNORMAL HIGH (ref 70–99)
POTASSIUM: 3.8 meq/L (ref 3.5–5.1)
SODIUM: 137 meq/L (ref 135–145)
Total Bilirubin: 0.7 mg/dL (ref 0.2–1.2)
Total Protein: 7.3 g/dL (ref 6.0–8.3)

## 2018-04-23 ENCOUNTER — Telehealth: Payer: Self-pay | Admitting: Internal Medicine

## 2018-04-23 NOTE — Telephone Encounter (Signed)
Tried contacting pt husband on home phone, no answer or VM. Will try tomorrow.

## 2018-04-23 NOTE — Telephone Encounter (Signed)
Kidney function is worse.  Is her leg swelling better?  Would 1/2 of a pill of lasix or 20 mg daily or 40 mg alternating with 20 mg be enough to control the swelling?  A lower dose would be more ideal.

## 2018-04-24 NOTE — Telephone Encounter (Signed)
Spoke with pts husband. Verbalized understanding.

## 2018-04-24 NOTE — Telephone Encounter (Signed)
Patient's husband returned [place] telephone call.  Please call.

## 2018-04-24 NOTE — Telephone Encounter (Signed)
Spoke with pt, pt states this is too much information for her. Tried contacting pts husband but mobile number is not the correct number. Will try calling pt's husband at a later time.

## 2018-04-24 NOTE — Telephone Encounter (Signed)
LVM for pt's husband to call back

## 2018-04-30 ENCOUNTER — Telehealth: Payer: Self-pay | Admitting: Internal Medicine

## 2018-04-30 NOTE — Telephone Encounter (Signed)
Copied from [place] [id number]. Topic: Quick Communication - See Telephone Encounter >> Apr 30, 2018 10:11 AM Bea Graff, NT wrote: CRM for notification. See Telephone encounter for: 04/30/18. Pts husband calling and states his wife has a rash around where the adhesive sticks for her colostomy bag. He wants some advise of what they could do for the rash and would could be causing it. Requesting Lovena Le to call him.

## 2018-04-30 NOTE — Telephone Encounter (Signed)
Spoke with pts husband to advise, per Dr Quay Burow he could try a medical supply store for power that is used just for stomas.

## 2018-04-30 NOTE — Telephone Encounter (Signed)
Is it a red, raised, itchy rash or just irritation.   We can try either a powder (irritation only ) or cream (possible yeast infection).

## 2018-05-02 ENCOUNTER — Other Ambulatory Visit (INDEPENDENT_AMBULATORY_CARE_PROVIDER_SITE_OTHER): Payer: Medicare Other

## 2018-05-02 ENCOUNTER — Other Ambulatory Visit: Payer: Self-pay | Admitting: Internal Medicine

## 2018-05-02 DIAGNOSIS — R600 Localized edema: Secondary | ICD-10-CM

## 2018-05-02 LAB — COMPREHENSIVE METABOLIC PANEL
ALK PHOS: 101 U/L (ref 39–117)
ALT: 11 U/L (ref 0–35)
AST: 12 U/L (ref 0–37)
Albumin: 4.1 g/dL (ref 3.5–5.2)
BILIRUBIN TOTAL: 0.8 mg/dL (ref 0.2–1.2)
BUN: 24 mg/dL — AB (ref 6–23)
CHLORIDE: 99 meq/L (ref 96–112)
CO2: 29 meq/L (ref 19–32)
CREATININE: 1.52 mg/dL — AB (ref 0.40–1.20)
Calcium: 9.3 mg/dL (ref 8.4–10.5)
GFR: 34.78 mL/min — ABNORMAL LOW (ref >60.00)
Glucose, Bld: 111 mg/dL — ABNORMAL HIGH (ref 70–99)
Potassium: 3.9 mEq/L (ref 3.5–5.1)
SODIUM: 139 meq/L (ref 135–145)
Total Protein: 7 g/dL (ref 6.0–8.3)

## 2018-05-04 ENCOUNTER — Encounter: Payer: Self-pay | Admitting: Internal Medicine

## 2018-05-05 ENCOUNTER — Other Ambulatory Visit: Payer: Self-pay | Admitting: Internal Medicine

## 2018-05-28 ENCOUNTER — Other Ambulatory Visit: Payer: Self-pay | Admitting: Internal Medicine

## 2018-06-19 NOTE — Progress Notes (Signed)
Subjective:    Patient ID: Toni [name], female    DOB: 07/12/1934, 84 y.o.   MRN: [id number]  HPI The patient is here for an acute visit.  She is here with her husband who provides most of the history due to her dementia.  She has a rash around her ostomy site and it is not going away.  She occasionally gets a rash and it usually goes away.  This rash is not getting better or worse-it just stays there.  The pouches he is using are once a week pouches.  These pouches are not sticking to her skin and often he has to use more than one pouch a week.  He has used these pouches for two years.   He has cream and powders and has been using all of his same supplies.  He has noticed that there has been some leakage and that could be causing increased moisture.  No change in bowel habits.  No fevers or chills.  She does complain about itching in this area at times.  No remainder abdominal pain.   Medications and allergies reviewed with patient and updated if appropriate.  Patient Active Problem List   Diagnosis Date Noted  . Skin irritation 06/20/2018  . Knee pain, bilateral 04/09/2018  . Prediabetes 12/19/2017  . Edema 01/30/2017  . Colostomy status ([place]) 01/07/2017  . NSTEMI (non-ST elevated myocardial infarction) ([place]) 10/26/2016  . S/P laparoscopic colectomy 10/19/2016  . Colovesical fistula 08/27/2016  . Chronic fatigue 03/30/2016  . Moderate dementia with behavioral disturbance 03/22/2016  . Hyperlipidemia 03/22/2016  . Mild cognitive impairment 09/21/2015  . Depression 09/21/2015  . History of stroke 09/21/2015  . CKD (chronic kidney disease)   . Lesion of right native kidney 04/13/2015  . Diastolic CHF ([place]) 44/31/5400  . History of Clostridium difficile infection 04/05/2015  . Essential hypertension 03/23/2015  . Abnormal CT of brain 03/04/2015  . Cervicogenic headache 01/22/2012  . Diverticulosis of large intestine 10/28/2009  . DIVERTICULITIS, HX OF 10/28/2009  .  COLONIC POLYPS, HX OF 03/07/2009  . Anxiety 07/28/2008  . DRY EYE SYNDROME 03/12/2008  . HEPATIC CYST 04/21/2007  . MIGRAINES, HX OF 04/21/2007    Current Outpatient Medications on File Prior to Visit  Medication Sig Dispense Refill  . aspirin 81 MG tablet Take 81 mg by mouth daily.    Marland Kitchen atorvastatin (LIPITOR) 20 MG tablet Take 1 tablet (20 mg total) by mouth at bedtime. 90 tablet 3  . Benzethonium Chloride (ANTI-BACTERIAL CLEANSING WIPES) 0.18 % MISC Apply Topically. 25 each 1  . buPROPion (WELLBUTRIN XL) 150 MG 24 hr tablet Take 1 tablet every night 90 tablet 3  . cyanocobalamin 500 MCG tablet Take 500 mcg by mouth daily.    . cycloSPORINE (RESTASIS) 0.05 % ophthalmic emulsion Place 1 drop into both eyes 2 (two) times daily.    Marland Kitchen donepezil (ARICEPT) 10 MG tablet Take 1 tablet (10 mg total) by mouth at bedtime. 90 tablet 3  . furosemide (LASIX) 40 MG tablet Take 1 tablet (40 mg total) by mouth daily. Take in the morning 90 tablet 3  . isosorbide mononitrate (IMDUR) 60 MG 24 hr tablet Take 1 tablet (60 mg total) by mouth daily. 90 tablet 3  . losartan (COZAAR) 100 MG tablet Take 1 tablet (100 mg total) by mouth daily. 90 tablet 3  . memantine (NAMENDA) 10 MG tablet Take 1 tablet twice a day 180 tablet 3  . metoprolol tartrate (LOPRESSOR) 50  MG tablet Take 1 tablet (50 mg total) by mouth 2 (two) times daily. 180 tablet 3  . mirtazapine (REMERON) 30 MG tablet Take 1 tablet (30 mg total) by mouth at bedtime. 90 tablet 1  . Ostomy Supplies (CLOSED-END COLOSTOMY POUCH) MISC Use as directed 20 each 0  . Ostomy Supplies (ODOR SPRAY) LIQD uad 118 mL 11   No current facility-administered medications on file prior to visit.     Past Medical History:  Diagnosis Date  . Acute kidney injury ([place])   . C. difficile colitis 2016  . Colonic polyp   . Depression   . Diverticulosis   . GERD (gastroesophageal reflux disease)   . Headache    sinus  . Helicobacter pylori gastritis 1987  . Hepatic  cyst   . Hyperlipemia   . Hypertension   . Pneumonia    hx of x 2   . PUD (peptic ulcer disease) 1987   with h pylori    Past Surgical History:  Procedure Laterality Date  . ABDOMINAL HYSTERECTOMY     BSO ; endometriosis; ? Appendectomy incidentally  . CHOLECYSTECTOMY  2008  . COLONOSCOPY  2003 & 2012   polyps; Dr Collene Mares  . CYSTOSCOPY  10/19/2016   Procedure: CYSTOSCOPY;  Surgeon: Nickie Retort, MD;  Location: [facility];  Service: Urology;;  . endoscopy gastritis  2008  . FLEXIBLE SIGMOIDOSCOPY N/A 05/31/2016   Procedure: FLEXIBLE SIGMOIDOSCOPY;  Surgeon: Juanita Craver, MD;  Location: [facility];  Service: Endoscopy;  Laterality: N/A;  . LAPAROSCOPIC PARTIAL COLECTOMY N/A 10/19/2016   Procedure: LAPAROSCOPIC ASSISTED SIGMIOD COLOECTOMY MOBILIZATION OF SPLEENIC [place];  Surgeon: Jackolyn Confer, MD;  Location: [facility];  Service: General;  Laterality: N/A;  . ROTATOR CUFF REPAIR     right  . TONSILLECTOMY AND ADENOIDECTOMY      Social History   Socioeconomic History  . Marital status: Married    Spouse name: Not on file  . Number of children: 2  . Years of education: Not on file  . Highest education level: Not on file  Occupational History  . Not on file  Social Needs  . Financial resource strain: Not on file  . Food insecurity:    Worry: Not on file    Inability: Not on file  . Transportation needs:    Medical: Not on file    Non-medical: Not on file  Tobacco Use  . Smoking status: Former Smoker    Last attempt to quit: 12/10/1973    Years since quitting: 44.5  . Smokeless tobacco: Never Used  Substance and Sexual Activity  . Alcohol use: Yes    Alcohol/week: 0.0 oz    Comment: 2 glasses of wine daily   . Drug use: No  . Sexual activity: Not on file  Lifestyle  . Physical activity:    Days per week: Not on file    Minutes per session: Not on file  . Stress: Not on file  Relationships  . Social connections:    Talks on phone: Not on file    Gets  together: Not on file    Attends religious service: Not on file    Active member of club or organization: Not on file    Attends meetings of clubs or organizations: Not on file    Relationship status: Not on file  Other Topics Concern  . Not on file  Social History Narrative  . Not on file    Family History  Problem Relation Age  of Onset  . Asthma Brother   . Hypertension Father   . Heart attack Father 6  . Leukemia Mother   . Stomach cancer Maternal Aunt   . Breast cancer Sister   . Thyroid disease Sister   . Stroke Neg Hx   . Diabetes Neg Hx     Review of Systems  Constitutional: Negative for chills and fever.  Gastrointestinal: Negative for abdominal pain and nausea.       No change in bowel habits  Skin: Positive for rash. Negative for color change and wound.       Objective:   Vitals:   06/20/18 1315  BP: (!) 100/56  Pulse: 69  Resp: 16  Temp: 97.9 F (36.6 C)  SpO2: 97%   BP Readings from Last 3 Encounters:  06/20/18 (!) 100/56  04/09/18 (!) 98/52  04/09/18 140/62   Wt Readings from Last 3 Encounters:  06/20/18 207 lb (93.9 kg)  04/09/18 208 lb (94.3 kg)  04/09/18 207 lb (93.9 kg)   Body mass index is 35.53 kg/m.   Physical Exam  Constitutional: She appears well-developed and well-nourished. No distress.  HENT:  Head: Normocephalic and atraumatic.  Abdominal: Soft. She exhibits no distension and no mass. There is no tenderness.  Ostomy itself looks healthy, skin surrounding and circular region has a macular papular rash, no vesicles/blisters, no generalized erythema, no open sores or excoriation marks, no swelling  Musculoskeletal: She exhibits no edema.  Skin: Skin is warm and dry. She is not diaphoretic.           Assessment & Plan:    See Problem List for Assessment and Plan of chronic medical problems.

## 2018-06-20 ENCOUNTER — Encounter: Payer: Self-pay | Admitting: Internal Medicine

## 2018-06-20 ENCOUNTER — Ambulatory Visit (INDEPENDENT_AMBULATORY_CARE_PROVIDER_SITE_OTHER): Payer: Medicare Other | Admitting: Internal Medicine

## 2018-06-20 VITALS — BP 100/56 | HR 69 | Temp 97.9°F | Resp 16 | Wt 207.0 lb

## 2018-06-20 DIAGNOSIS — R238 Other skin changes: Secondary | ICD-10-CM | POA: Diagnosis not present

## 2018-06-20 MED ORDER — NYSTATIN 100000 UNIT/GM EX CREA: 1.0000 "application " | TOPICAL_CREAM | Freq: Two times a day (BID) | CUTANEOUS | 0 refills | Status: DC

## 2018-06-20 MED ORDER — NYSTATIN 100000 UNIT/GM EX POWD: Freq: Four times a day (QID) | CUTANEOUS | 0 refills | Status: DC

## 2018-06-20 NOTE — Assessment & Plan Note (Signed)
Skin irritation around ostomy site-persistent and not improving with usual treatment of skin and ostomy hygiene Does itch at times and is a new erythematous maculopapular rash Probable yeast infection We will prescribe nystatin cream and powder-her husband will try both to see what works best with the ostomy bag They will start attending an ostomy support group-this may provide some other good tips If there is no improvement in the skin irritation may need referral-?  Wound center

## 2018-06-20 NOTE — Patient Instructions (Signed)
Try the anti-fungal cream and powder and see if one of them works.    Call if no improvement

## 2018-07-01 ENCOUNTER — Other Ambulatory Visit: Payer: Self-pay | Admitting: Internal Medicine

## 2018-07-02 ENCOUNTER — Other Ambulatory Visit: Payer: Self-pay | Admitting: Internal Medicine

## 2018-07-02 DIAGNOSIS — Z933 Colostomy status: Secondary | ICD-10-CM

## 2018-07-04 ENCOUNTER — Telehealth: Payer: Self-pay | Admitting: Internal Medicine

## 2018-07-04 NOTE — Telephone Encounter (Unsigned)
Copied from [place] [id number]. Topic: Referral - Status >> Jul 04, 2018 10:21 AM Neva Seat wrote: Pt's husband - Pilar Westergaard is checking on pt's referral to Dr. Sula Rumple -  Fax [phone] Please call Mikki Santee back to let him know of the status.

## 2018-07-04 NOTE — Telephone Encounter (Signed)
Referral was faxed yesterday. Informed patient's husband.

## 2018-07-25 DIAGNOSIS — Z7189 Other specified counseling: Secondary | ICD-10-CM | POA: Diagnosis not present

## 2018-08-18 ENCOUNTER — Other Ambulatory Visit: Payer: Self-pay | Admitting: Internal Medicine

## 2018-08-25 NOTE — Progress Notes (Signed)
Subjective:    Patient ID: Toni [name], female    DOB: 14-Feb-1934, 84 y.o.   MRN: [id number]  HPI The patient is here for an acute visit.  Urinary incontinence only at night:  She denies any incontinence during the day - it only occurs at night.  She wears a pad at night.  She sleeps through the night.  Her and her husband do not think the incontinence started with increasing the Remeron.  She denies any dysuria, hematuria or odor to the urine.  Chronic leg edema:  She is taking lasix 20 mg daily.  She elevates her legs during the day.  She limits the salt intake.  She has not ever tried compression socks.  The swelling does not bother her that much.  Swelling does get worse throughout the day and then improves overnight.    Depression: She is taking Remeron at night and the Wellbutrin daily.  Her husband thinks she is still depressed.  She states there is some depression-much if this is related to her ostomy.  Her dementia likely contributes as well.  Hypertension: She is taking her medication daily. She is compliant with a low sodium diet.   She is not exercising regularly.  Her husband does monitor her blood pressure at home and typically it is [id number] systolically.    Medications and allergies reviewed with patient and updated if appropriate.  Patient Active Problem List   Diagnosis Date Noted  . Skin irritation 06/20/2018  . Knee pain, bilateral 04/09/2018  . Prediabetes 12/19/2017  . Edema 01/30/2017  . Colostomy status ([place]) 01/07/2017  . NSTEMI (non-ST elevated myocardial infarction) ([employer]) 10/26/2016  . S/P laparoscopic colectomy 10/19/2016  . Colovesical fistula 08/27/2016  . Chronic fatigue 03/30/2016  . Moderate dementia with behavioral disturbance 03/22/2016  . Hyperlipidemia 03/22/2016  . Mild cognitive impairment 09/21/2015  . Depression 09/21/2015  . History of stroke 09/21/2015  . CKD (chronic kidney disease)   . Lesion of right native kidney  04/13/2015  . Diastolic CHF ([place]) 62/22/9798  . History of Clostridium difficile infection 04/05/2015  . Essential hypertension 03/23/2015  . Abnormal CT of brain 03/04/2015  . Cervicogenic headache 01/22/2012  . Diverticulosis of large intestine 10/28/2009  . DIVERTICULITIS, HX OF 10/28/2009  . COLONIC POLYPS, HX OF 03/07/2009  . Anxiety 07/28/2008  . DRY EYE SYNDROME 03/12/2008  . HEPATIC CYST 04/21/2007  . MIGRAINES, HX OF 04/21/2007    Current Outpatient Medications on File Prior to Visit  Medication Sig Dispense Refill  . aspirin 81 MG tablet Take 81 mg by mouth daily.    Marland Kitchen atorvastatin (LIPITOR) 20 MG tablet Take 1 tablet (20 mg total) by mouth at bedtime. 90 tablet 3  . Benzethonium Chloride (ANTI-BACTERIAL CLEANSING WIPES) 0.18 % MISC Apply Topically. 25 each 1  . buPROPion (WELLBUTRIN XL) 150 MG 24 hr tablet Take 1 tablet every night 90 tablet 3  . cyanocobalamin 500 MCG tablet Take 500 mcg by mouth daily.    . cycloSPORINE (RESTASIS) 0.05 % ophthalmic emulsion Place 1 drop into both eyes 2 (two) times daily.    Marland Kitchen donepezil (ARICEPT) 10 MG tablet Take 1 tablet (10 mg total) by mouth at bedtime. 90 tablet 3  . hydrochlorothiazide (HYDRODIURIL) 25 MG tablet Take 25 mg by mouth daily.    . isosorbide mononitrate (IMDUR) 60 MG 24 hr tablet Take 1 tablet (60 mg total) by mouth daily. 90 tablet 3  . losartan (COZAAR) 100 MG tablet Take  1 tablet (100 mg total) by mouth daily. 90 tablet 3  . memantine (NAMENDA) 10 MG tablet Take 1 tablet twice a day 180 tablet 3  . metoprolol tartrate (LOPRESSOR) 50 MG tablet Take 1 tablet (50 mg total) by mouth 2 (two) times daily. 180 tablet 3  . mirtazapine (REMERON) 30 MG tablet TAKE 1 TABLET BY MOUTH AT  BEDTIME 90 tablet 1  . nystatin (MYCOSTATIN/NYSTOP) powder Apply topically 4 (four) times daily. 45 g 0  . nystatin cream (MYCOSTATIN) Apply 1 application topically 2 (two) times daily. 30 g 0  . Ostomy Supplies (CLOSED-END COLOSTOMY POUCH) MISC  Use as directed 20 each 0  . Ostomy Supplies (ODOR SPRAY) LIQD uad 118 mL 11   No current facility-administered medications on file prior to visit.     Past Medical History:  Diagnosis Date  . Acute kidney injury ([place])   . C. difficile colitis 2016  . Colonic polyp   . Depression   . Diverticulosis   . GERD (gastroesophageal reflux disease)   . Headache    sinus  . Helicobacter pylori gastritis 1987  . Hepatic cyst   . Hyperlipemia   . Hypertension   . Pneumonia    hx of x 2   . PUD (peptic ulcer disease) 1987   with h pylori    Past Surgical History:  Procedure Laterality Date  . ABDOMINAL HYSTERECTOMY     BSO ; endometriosis; ? Appendectomy incidentally  . CHOLECYSTECTOMY  2008  . COLONOSCOPY  2003 & 2012   polyps; Dr Collene Mares  . CYSTOSCOPY  10/19/2016   Procedure: CYSTOSCOPY;  Surgeon: Nickie Retort, MD;  [facility];  Service: Urology;;  . endoscopy gastritis  2008  . FLEXIBLE SIGMOIDOSCOPY N/A 05/31/2016   Procedure: FLEXIBLE SIGMOIDOSCOPY;  Surgeon: Juanita Craver, MD;  [facility];  Service: Endoscopy;  Laterality: N/A;  . LAPAROSCOPIC PARTIAL COLECTOMY N/A 10/19/2016   Procedure: LAPAROSCOPIC ASSISTED SIGMIOD COLOECTOMY MOBILIZATION OF SPLEENIC [place];  Surgeon: Jackolyn Confer, MD;  Location[facility];  Service: General;  Laterality: N/A;  . ROTATOR CUFF REPAIR     right  . TONSILLECTOMY AND ADENOIDECTOMY      Social History   Socioeconomic History  . Marital status: Married    Spouse name: Not on file  . Number of children: 2  . Years of education: Not on file  . Highest education level: Not on file  Occupational History  . Not on file  Social Needs  . Financial resource strain: Not on file  . Food insecurity:    Worry: Not on file    Inability: Not on file  . Transportation needs:    Medical: Not on file    Non-medical: Not on file  Tobacco Use  . Smoking status: Former Smoker    Last attempt to quit: 12/10/1973    Years  since quitting: 44.7  . Smokeless tobacco: Never Used  Substance and Sexual Activity  . Alcohol use: Yes    Alcohol/week: 0.0 standard drinks    Comment: 2 glasses of wine daily   . Drug use: No  . Sexual activity: Not on file  Lifestyle  . Physical activity:    Days per week: Not on file    Minutes per session: Not on file  . Stress: Not on file  Relationships  . Social connections:    Talks on phone: Not on file    Gets together: Not on file    Attends religious service:  Not on file    Active member of club or organization: Not on file    Attends meetings of clubs or organizations: Not on file    Relationship status: Not on file  Other Topics Concern  . Not on file  Social History Narrative  . Not on file    Family History  Problem Relation Age of Onset  . Asthma Brother   . Hypertension Father   . Heart attack Father 15  . Leukemia Mother   . Stomach cancer Maternal Aunt   . Breast cancer Sister   . Thyroid disease Sister   . Stroke Neg Hx   . Diabetes Neg Hx     Review of Systems  Constitutional: Negative for fever.  Respiratory: Negative for cough, shortness of breath and wheezing.   Cardiovascular: Positive for leg swelling. Negative for chest pain and palpitations.  Neurological: Positive for headaches (sinus). Negative for dizziness and light-headedness.       Objective:   Vitals:   08/26/18 1301  BP: (!) 108/52  Pulse: 68  Resp: 16  Temp: 98.1 F (36.7 C)  SpO2: 96%   BP Readings from Last 3 Encounters:  08/26/18 (!) 108/52  06/20/18 (!) 100/56  04/09/18 (!) 98/52   Wt Readings from Last 3 Encounters:  08/26/18 212 lb (96.2 kg)  06/20/18 207 lb (93.9 kg)  04/09/18 208 lb (94.3 kg)   Body mass index is 36.39 kg/m.   Physical Exam    Constitutional: Appears well-developed and well-nourished. No distress.  HENT:  Head: Normocephalic and atraumatic.  Neck: Neck supple. No tracheal deviation present. No thyromegaly present.  No cervical  lymphadenopathy Cardiovascular: Normal rate, regular rhythm and normal heart sounds.   No murmur heard. No carotid bruit .  No edema Pulmonary/Chest: Effort normal and breath sounds normal. No respiratory distress. No has no wheezes. No rales.  Skin: Skin is warm and dry. Not diaphoretic.  Psychiatric: Normal mood and affect. Behavior is normal.       Assessment & Plan:    See Problem List for Assessment and Plan of chronic medical problems.

## 2018-08-26 ENCOUNTER — Ambulatory Visit (INDEPENDENT_AMBULATORY_CARE_PROVIDER_SITE_OTHER): Payer: Medicare Other | Admitting: Internal Medicine

## 2018-08-26 ENCOUNTER — Encounter: Payer: Self-pay | Admitting: Internal Medicine

## 2018-08-26 ENCOUNTER — Other Ambulatory Visit (INDEPENDENT_AMBULATORY_CARE_PROVIDER_SITE_OTHER): Payer: Medicare Other

## 2018-08-26 VITALS — BP 108/52 | HR 68 | Temp 98.1°F | Resp 16 | Ht 64.0 in | Wt 212.0 lb

## 2018-08-26 DIAGNOSIS — R7303 Prediabetes: Secondary | ICD-10-CM | POA: Diagnosis not present

## 2018-08-26 DIAGNOSIS — F320 Major depressive disorder, single episode, mild: Secondary | ICD-10-CM | POA: Diagnosis not present

## 2018-08-26 DIAGNOSIS — R600 Localized edema: Secondary | ICD-10-CM | POA: Diagnosis not present

## 2018-08-26 DIAGNOSIS — N189 Chronic kidney disease, unspecified: Secondary | ICD-10-CM

## 2018-08-26 DIAGNOSIS — E7849 Other hyperlipidemia: Secondary | ICD-10-CM

## 2018-08-26 DIAGNOSIS — Z23 Encounter for immunization: Secondary | ICD-10-CM | POA: Diagnosis not present

## 2018-08-26 DIAGNOSIS — R32 Unspecified urinary incontinence: Secondary | ICD-10-CM | POA: Diagnosis not present

## 2018-08-26 DIAGNOSIS — I10 Essential (primary) hypertension: Secondary | ICD-10-CM

## 2018-08-26 LAB — CBC WITH DIFFERENTIAL/PLATELET
Basophils Absolute: 0 K/uL (ref 0.0–0.1)
Basophils Relative: 0.3 % (ref 0.0–3.0)
Eosinophils Absolute: 0.2 K/uL (ref 0.0–0.7)
Eosinophils Relative: 2.3 % (ref 0.0–5.0)
HCT: 36.8 % (ref 36.0–46.0)
Hemoglobin: 12.8 g/dL (ref 12.0–15.0)
Lymphocytes Relative: 20.1 % (ref 12.0–46.0)
Lymphs Abs: 1.9 K/uL (ref 0.7–4.0)
MCHC: 34.7 g/dL (ref 30.0–36.0)
MCV: 88.4 fl (ref 78.0–100.0)
Monocytes Absolute: 0.7 K/uL (ref 0.1–1.0)
Monocytes Relative: 7.2 % (ref 3.0–12.0)
Neutro Abs: 6.7 K/uL (ref 1.4–7.7)
Neutrophils Relative %: 70.1 % (ref 43.0–77.0)
Platelets: 330 K/uL (ref 150.0–400.0)
RBC: 4.16 Mil/uL (ref 3.87–5.11)
RDW: 13.7 % (ref 11.5–15.5)
WBC: 9.5 K/uL (ref 4.0–10.5)

## 2018-08-26 LAB — LIPID PANEL
Cholesterol: 143 mg/dL (ref 0–200)
HDL: 56.1 mg/dL (ref >39.00)
LDL Cholesterol: 62 mg/dL (ref 0–99)
NONHDL: 86.66
Total CHOL/HDL Ratio: 3
Triglycerides: 124 mg/dL (ref 0.0–149.0)
VLDL: 24.8 mg/dL (ref 0.0–40.0)

## 2018-08-26 LAB — COMPREHENSIVE METABOLIC PANEL
ALK PHOS: 95 U/L (ref 39–117)
ALT: 12 U/L (ref 0–35)
AST: 12 U/L (ref 0–37)
Albumin: 4.1 g/dL (ref 3.5–5.2)
BILIRUBIN TOTAL: 0.6 mg/dL (ref 0.2–1.2)
BUN: 24 mg/dL — ABNORMAL HIGH (ref 6–23)
CO2: 26 mEq/L (ref 19–32)
Calcium: 9.2 mg/dL (ref 8.4–10.5)
Chloride: 102 mEq/L (ref 96–112)
Creatinine, Ser: 1.46 mg/dL — ABNORMAL HIGH (ref 0.40–1.20)
GFR: 36.4 mL/min — ABNORMAL LOW (ref >60.00)
GLUCOSE: 106 mg/dL — AB (ref 70–99)
Potassium: 3.8 mEq/L (ref 3.5–5.1)
SODIUM: 140 meq/L (ref 135–145)
TOTAL PROTEIN: 7 g/dL (ref 6.0–8.3)

## 2018-08-26 LAB — HEMOGLOBIN A1C: HEMOGLOBIN A1C: 5.8 % (ref 4.6–6.5)

## 2018-08-26 MED ORDER — FUROSEMIDE 20 MG PO TABS: 20.0000 mg | ORAL_TABLET | Freq: Every day | ORAL | 3 refills | Status: DC

## 2018-08-26 NOTE — Assessment & Plan Note (Signed)
Blood pressure on the low side here today and I am inclined to lower 1 of her medications, but her husband states it is always higher at home They will make a nurse visit and bring in with her cough so that we can calibrate the cough and see which measures are accurate Can adjust medication as needed CMP

## 2018-08-26 NOTE — Assessment & Plan Note (Signed)
Edema somewhat stable We will continue 20 mg daily since 40 mg caused decreased renal function Continue low-sodium diet and elevating legs when sitting Advised trying compression socks

## 2018-08-26 NOTE — Assessment & Plan Note (Signed)
Check a1c Low sugar / carb diet Stressed regular exercise   

## 2018-08-26 NOTE — Assessment & Plan Note (Signed)
Taking Lasix 20 mg daily for chronic leg edema CMP

## 2018-08-26 NOTE — Patient Instructions (Addendum)
Try compression socks for your leg swelling.    Test(s) ordered today. Your results will be released to [place] (or called to you) after review, usually within 72hours after test completion. If any changes need to be made, you will be notified at that same time.  Flu immunization administered today.    Medications reviewed and updated.  No changes recommended at this time.   Please followup in 6 months

## 2018-08-26 NOTE — Assessment & Plan Note (Signed)
Difficult to know how well her depression is truly controlled-her husband states it is not and she is somewhat indifferent Taking Wellbutrin and Remeron at night hate to make any changes because of possible side effects We will continue current medications at current doses

## 2018-08-26 NOTE — Assessment & Plan Note (Signed)
Only occurs at nighttime, no incontinence during the day They do not associate it with any changes in medication at nighttime-may be indeed her sleep at night with Remeron and not waking up Discussed side effects of any bladder medication we all agree to not try anything because of the effects negatively on memory May need to consider stopping Remeron, but for now she can tolerate this symptom and will continue using a pad at night

## 2018-08-26 NOTE — Assessment & Plan Note (Signed)
Continue atorvastatin Check lipid panel, CMP 

## 2018-08-29 ENCOUNTER — Ambulatory Visit: Payer: Medicare Other | Admitting: Emergency Medicine

## 2018-08-29 VITALS — BP 134/74

## 2018-08-29 DIAGNOSIS — R031 Nonspecific low blood-pressure reading: Secondary | ICD-10-CM

## 2018-08-29 NOTE — Progress Notes (Signed)
Noted.  Both with similar results.

## 2018-08-29 NOTE — Progress Notes (Signed)
BP also checked with Pts home machine. 139/65 in right arm.

## 2018-09-24 ENCOUNTER — Telehealth: Payer: Self-pay | Admitting: Internal Medicine

## 2018-09-24 MED ORDER — FUROSEMIDE 20 MG PO TABS: 20.0000 mg | ORAL_TABLET | Freq: Every day | ORAL | 1 refills | Status: DC

## 2018-09-24 NOTE — Telephone Encounter (Signed)
Copied from [place] [id number]. Topic: Quick Communication - Rx Refill/Question >> Sep 24, 2018 11:25 AM Judyann Munson wrote: Medication: furosemide (LASIX) 20 MG tablet     Has the patient contacted their pharmacy? Yes   Preferred Pharmacy (with phone number or street name): [place], [place] [phone] (Phone) [phone] (Fax)    Agent: Please be advised that RX refills may take up to 3 business days. We ask that you follow-up with your pharmacy.

## 2018-09-24 NOTE — Telephone Encounter (Signed)
Rx refilled.

## 2018-09-28 ENCOUNTER — Other Ambulatory Visit: Payer: Self-pay | Admitting: Internal Medicine

## 2018-09-28 ENCOUNTER — Encounter: Payer: Self-pay | Admitting: Internal Medicine

## 2018-09-28 MED ORDER — LOSARTAN POTASSIUM 100 MG PO TABS: 50.0000 mg | ORAL_TABLET | Freq: Every day | ORAL | 3 refills | Status: DC

## 2018-10-02 ENCOUNTER — Telehealth: Payer: Self-pay | Admitting: Internal Medicine

## 2018-10-02 NOTE — Telephone Encounter (Signed)
Copied from [place] [id number]. Topic: General - Other >> Oct 02, 2018  3:24 PM Carolyn Stare wrote:  Pt husband call to ask since pt med was decrease to 50 mg he is asking that a rx be called in for the 50mg  to there pharmacy   losartan (COZAAR) 100 MG tablet  [facility]

## 2018-10-03 MED ORDER — LOSARTAN POTASSIUM 50 MG PO TABS: 50.0000 mg | ORAL_TABLET | Freq: Every day | ORAL | 1 refills | Status: DC

## 2018-10-03 NOTE — Telephone Encounter (Signed)
Rx sent for 50 mg to [facility].

## 2018-10-03 NOTE — Telephone Encounter (Signed)
Husband is aware.

## 2018-10-10 ENCOUNTER — Ambulatory Visit (INDEPENDENT_AMBULATORY_CARE_PROVIDER_SITE_OTHER): Payer: Medicare Other | Admitting: Neurology

## 2018-10-10 ENCOUNTER — Encounter: Payer: Self-pay | Admitting: Neurology

## 2018-10-10 ENCOUNTER — Encounter

## 2018-10-10 DIAGNOSIS — F0391 Unspecified dementia with behavioral disturbance: Secondary | ICD-10-CM | POA: Diagnosis not present

## 2018-10-10 DIAGNOSIS — F03B18 Unspecified dementia, moderate, with other behavioral disturbance: Secondary | ICD-10-CM

## 2018-10-10 MED ORDER — BUPROPION HCL ER (XL) 150 MG PO TB24: ORAL_TABLET | ORAL | 3 refills | Status: DC

## 2018-10-10 MED ORDER — MEMANTINE HCL 10 MG PO TABS: ORAL_TABLET | ORAL | 3 refills | Status: DC

## 2018-10-10 MED ORDER — DONEPEZIL HCL 10 MG PO TABS: 10.0000 mg | ORAL_TABLET | Freq: Every day | ORAL | 3 refills | Status: DC

## 2018-10-10 NOTE — Patient Instructions (Signed)
1. Increase Wellbutrin XL 150mg : Take 1 tablet twice a day 2. Continue Donepezil 10mg  daily and Memantine 10mg  twice a day 3. Follow-up in 6 months, call for any changes  FALL PRECAUTIONS: Be cautious when walking. Scan the area for obstacles that may increase the risk of trips and falls. When getting up in the mornings, sit up at the edge of the bed for a few minutes before getting out of bed. Consider elevating the bed at the head end to avoid drop of blood pressure when getting up. Walk always in a well-lit room (use night lights in the walls). Avoid area rugs or power cords from appliances in the middle of the walkways. Use a walker or a cane if necessary and consider physical therapy for balance exercise. Get your eyesight checked regularly.  HOME SAFETY: Consider the safety of the kitchen when operating appliances like stoves, microwave oven, and blender. Consider having supervision and share cooking responsibilities until no longer able to participate in those. Accidents with firearms and other hazards in the house should be identified and addressed as well.  ABILITY TO BE LEFT ALONE: If patient is unable to contact 911 operator, consider using LifeLine, or when the need is there, arrange for someone to stay with patients. Smoking is a fire hazard, consider supervision or cessation. Risk of wandering should be assessed by caregiver and if detected at any point, supervision and safe proof recommendations should be instituted.  RECOMMENDATIONS FOR ALL PATIENTS WITH MEMORY PROBLEMS: 1. Continue to exercise (Recommend 30 minutes of walking everyday, or 3 hours every week) 2. Increase social interactions - continue going to [place] and enjoy social gatherings with friends and family 3. Eat healthy, avoid fried foods and eat more fruits and vegetables 4. Maintain adequate blood pressure, blood sugar, and blood cholesterol level. Reducing the risk of stroke and cardiovascular disease also helps  promoting better memory. 5. Avoid stressful situations. Live a simple life and avoid aggravations. Organize your time and prepare for the next day in anticipation. 6. Sleep well, avoid any interruptions of sleep and avoid any distractions in the bedroom that may interfere with adequate sleep quality 7. Avoid sugar, avoid sweets as there is a strong link between excessive sugar intake, diabetes, and cognitive impairment The Mediterranean diet has been shown to help patients reduce the risk of progressive memory disorders and reduces cardiovascular risk. This includes eating fish, eat fruits and green leafy vegetables, nuts like almonds and hazelnuts, walnuts, and also use olive oil. Avoid fast foods and fried foods as much as possible. Avoid sweets and sugar as sugar use has been linked to worsening of memory function.  There is always a concern of gradual progression of memory problems. If this is the case, then we may need to adjust level of care according to patient needs. Support, both to the patient and caregiver, should then be put into place.

## 2018-10-10 NOTE — Progress Notes (Signed)
NEUROLOGY FOLLOW UP OFFICE NOTE  Toni [name] [id number]  DOB: 03/05/1934  HISTORY OF PRESENT ILLNESS: I had the pleasure of seeing Toni [name] in follow-up in the neurology clinic on 10/10/2018. The patient was last seen 6 months ago for mild dementia. MMSE 21/30 in May 2019, 22/30 in April 2018, 24/30 in October 2017, 23/30 in April 2017, 25/30 in October 2016). She is again accompanied by her husband who helps supplement the history today. She continues to take Aricept and Namenda without side effects. She reports memory is good. She is in a good mood today. She denies any difficulties with ADLs, but her husband nods behind her that she needs help with bathing and getting her clothes on. Her husband manages medications and finances. She does not drive. She states her mood is "not bad but not good." Wellbutrin XL 150mg  daily was added on her last visit, no side effects. She is also on mirtazapine at night. She tells her husband, "you just got to live with it." Sleep is good. She denies any significant headaches, dizziness, vision changes, focal numbness/tingling/weakness, no falls.   HPI: This is a pleasant 84 yo ambidextrous left-hand dominant woman with a history of hypertension, hyperlipidemia, presenting for evaluation of memory loss with abnormal head CT. When asked about her memory, Ms. Tumlin reports her short-term memory is "not good at all." Long-term memory is good, she can remember details about her childhood well. She started noticing changes in the past year, but worse in the past 4-5 months. She would forget conversations from 10 minutes ago. She forgot to pay bills in the past 2-3 months. She left the stove on twice around 3-4 months ago. She denies getting lost driving. She denies any problems multitasking, very seldom word-finding difficulties. Her son has noticed that when he calls her, she would repeat the same story at the end of the call, or have the same  conversation 10 minutes later. He also noticed changes in the past 1-2 years, worse in the past 3-4 months. Her daughter feels symptoms started a little longer than 2 years ago, she forgot a conversation that someone was in jail in December 2015. She was staying at her daughter's house and forgot what they had talked about on their to-do list. They also have noticed she is very anxious. Her husband reports she had taken something out of the freezer one time, he came out the next day to see some things were not put back inside. He has noticed that she is more argumentative.   She reports 4 episodes where a shade would come up her right eye. This would last for a few minutes, last episode was in the fall of last year. No associated headache or focal numbness/tingling/weakness. She states she "always has headaches," indicating these are sinus headaches. She had seen Dr. Jacelyn Grip in 2013 and was diagnosed with cervicogenic headaches. She has headaches around three times a week, with right frontal throbbing pain, relieved with over the counter pain medication. There is no associated nausea/vomiting/photo/phonophobia. She has been diagnosed with migraines where she has a round circle in her right eye occurring around 3-4 times a month. She denies any dizziness, diplopia, blurred vision, neck/back pain, focal numbness/tingling/weakness. She has chronic constipations. She has occasional hand tremors. No anosmia. She denies any family history of memory problems. No history of head injuries. She drinks 1 to 1-1/2 glasses of wine at night.   I personally reviewed head CT without contrast done 03/03/15.  There were several hypodensities in the bilateral hemispheres, right greater than left This was read as multifocal posterior circulation infarction with the largest area in the right occiptial pole and smaller area in th e left occipital cortex. Two small areas of cortical and subcortical infarct in the high right parietal lobe,  small vessel infarct in the upper right cerebellum. These could be subacute or chronic. Brain atrophy with mild frontal predominance seen.  MRI brain without contrast which did not show any acute changes, there was generalized atrophy with ventricular enlargement, chronic infarcts in the bilateral occipital lobes, right greater than left, small chronic infarct in the right parietal cortex, chronic microvascular disease.   PAST MEDICAL HISTORY: Past Medical History:  Diagnosis Date  . Acute kidney injury ([place])   . C. difficile colitis 2016  . Colonic polyp   . Depression   . Diverticulosis   . GERD (gastroesophageal reflux disease)   . Headache    sinus  . Helicobacter pylori gastritis 1987  . Hepatic cyst   . Hyperlipemia   . Hypertension   . Pneumonia    hx of x 2   . PUD (peptic ulcer disease) 1987   with h pylori    MEDICATIONS:  Outpatient Encounter Medications as of 10/10/2018  Medication Sig  . aspirin 81 MG tablet Take 81 mg by mouth daily.  Marland Kitchen atorvastatin (LIPITOR) 20 MG tablet Take 1 tablet (20 mg total) by mouth at bedtime.  . Benzethonium Chloride (ANTI-BACTERIAL CLEANSING WIPES) 0.18 % MISC Apply Topically.  Marland Kitchen buPROPion (WELLBUTRIN XL) 150 MG 24 hr tablet Take 1 tablet every night  . cyanocobalamin 500 MCG tablet Take 500 mcg by mouth daily.  . cycloSPORINE (RESTASIS) 0.05 % ophthalmic emulsion Place 1 drop into both eyes 2 (two) times daily.  Marland Kitchen donepezil (ARICEPT) 10 MG tablet Take 1 tablet (10 mg total) by mouth at bedtime.  . furosemide (LASIX) 20 MG tablet Take 1 tablet (20 mg total) by mouth daily.  . hydrochlorothiazide (HYDRODIURIL) 25 MG tablet Take 25 mg by mouth daily.  . isosorbide mononitrate (IMDUR) 60 MG 24 hr tablet Take 1 tablet (60 mg total) by mouth daily.  Marland Kitchen losartan (COZAAR) 50 MG tablet Take 1 tablet (50 mg total) by mouth daily.  . memantine (NAMENDA) 10 MG tablet Take 1 tablet twice a day  . metoprolol tartrate (LOPRESSOR) 50 MG tablet Take 1  tablet (50 mg total) by mouth 2 (two) times daily.  . mirtazapine (REMERON) 30 MG tablet TAKE 1 TABLET BY MOUTH AT  BEDTIME  . Ostomy Supplies (CLOSED-END COLOSTOMY POUCH) MISC Use as directed  . Ostomy Supplies (ODOR SPRAY) LIQD uad  . nystatin (MYCOSTATIN/NYSTOP) powder Apply topically 4 (four) times daily. (Patient not taking: Reported on 10/10/2018)  . nystatin cream (MYCOSTATIN) Apply 1 application topically 2 (two) times daily. (Patient not taking: Reported on 10/10/2018)   No facility-administered encounter medications on file as of 10/10/2018.      ALLERGIES: Allergies  Allergen Reactions  . Amlodipine Other (See Comments)    edema    FAMILY HISTORY: Family History  Problem Relation Age of Onset  . Asthma Brother   . Hypertension Father   . Heart attack Father 13  . Leukemia Mother   . Stomach cancer Maternal Aunt   . Breast cancer Sister   . Thyroid disease Sister   . Stroke Neg Hx   . Diabetes Neg Hx     SOCIAL HISTORY: Social History   Socioeconomic History  .  Marital status: Married    Spouse name: Not on file  . Number of children: 2  . Years of education: Not on file  . Highest education level: Not on file  Occupational History  . Not on file  Social Needs  . Financial resource strain: Not on file  . Food insecurity:    Worry: Not on file    Inability: Not on file  . Transportation needs:    Medical: Not on file    Non-medical: Not on file  Tobacco Use  . Smoking status: Former Smoker    Last attempt to quit: 12/10/1973    Years since quitting: 44.8  . Smokeless tobacco: Never Used  Substance and Sexual Activity  . Alcohol use: Yes    Alcohol/week: 0.0 standard drinks    Comment: 2 glasses of wine daily   . Drug use: No  . Sexual activity: Not on file  Lifestyle  . Physical activity:    Days per week: Not on file    Minutes per session: Not on file  . Stress: Not on file  Relationships  . Social connections:    Talks on phone: Not on file      Gets together: Not on file    Attends religious service: Not on file    Active member of club or organization: Not on file    Attends meetings of clubs or organizations: Not on file    Relationship status: Not on file  . Intimate partner violence:    Fear of current or ex partner: Not on file    Emotionally abused: Not on file    Physically abused: Not on file    Forced sexual activity: Not on file  Other Topics Concern  . Not on file  Social History Narrative  . Not on file    REVIEW OF SYSTEMS: Constitutional: No fevers, chills, or sweats, no generalized fatigue, change in appetite Eyes: No visual changes, double vision, eye pain Ear, nose and throat: No hearing loss, ear pain, nasal congestion, sore throat Cardiovascular: No chest pain, palpitations Respiratory:  No shortness of breath at rest or with exertion, wheezes GastrointestinaI: No nausea, vomiting, diarrhea, abdominal pain, fecal incontinence Genitourinary:  No dysuria, urinary retention or frequency Musculoskeletal:  + neck pain, back pain Integumentary: No rash, pruritus, skin lesions Neurological: as above Psychiatric: No depression, insomnia, anxiety Endocrine: No palpitations, fatigue, diaphoresis, mood swings, change in appetite, change in weight, increased thirst Hematologic/Lymphatic:  No anemia, purpura, petechiae. Allergic/Immunologic: no itchy/runny eyes, nasal congestion, recent allergic reactions, rashes  PHYSICAL EXAM: Vitals:   10/10/18 1521  BP: 108/60  Pulse: 74  SpO2: 98%   General: No acute distress Head:  Normocephalic/atraumatic Neck: supple, no paraspinal tenderness, full range of motion Heart:  Regular rate and rhythm Lungs:  Clear to auscultation bilaterally Back: No paraspinal tenderness Skin/Extremities: No rash, no edema Neurological Exam: alert and oriented to person, place. States it is Tues, summer (it is Friday, Fall). No aphasia or dysarthria. Fund of knowledge is  appropriate.  Remote memory intact. Attention and concentration are reduced.   Able to name objects and repeat phrases. CDT 4/5 MMSE - Mini Mental State Exam 10/10/2018 04/09/2018 03/22/2017  Orientation to time 0 1 1  Orientation to Place 5 5 5   Registration 3 3 3   Attention/ Calculation 3 5 5   Recall 0 0 0  Language- name 2 objects 2 2 2   Language- repeat 1 1 1   Language- follow 3 step command  3 2 3   Language- read & follow direction 1 1 1   Write a sentence 1 1 1   Copy design 0 0 0  Total score 19 21 22    Cranial nerves: Pupils equal, round, reactive to light. Extraocular movements intact with no nystagmus. Visual fields full. Facial sensation intact. No facial asymmetry. Tongue, uvula, palate midline.  Motor: Bulk and tone normal, muscle strength 5/5 throughout with no pronator drift.  Sensation to light touch intact.  No extinction to double simultaneous stimulation.  Deep tendon reflexes +1 throughout, toes downgoing.  Finger to nose testing intact.  Gait narrow-based and steady, mild difficulty with tandem walk but able.  Romberg negative. She has a mild postural and endpoint tremor bilaterally (similar to prior)  IMPRESSION: This is a pleasant 84 yo LH woman with vascular risk factors including hypertension, hyperlipidemia, with moderate dementia with behavioral disturbance. MMSE today 19/30, with continued progressive decline (21/30 in May 2019, 22/30 in April 2018, 24/30 in April 2017, 25/30 in October 2016). She is currently on Aricept 10mg  daily and Namenda 10mg  BID without side effects. We discussed increasing dose of Wellbutrin XL 150mg , take 1 tablet twice a day for behavioral changes, she is in a better mood today but husband shakes his head. We again discussed control of vascular risk factors, the importance of physical exercise and brain stimulation exercises for brain health. She will follow-up in 6 months or earlier if needed.  Thank you for allowing me to participate in her care.   Please do not hesitate to call for any questions or concerns.  The duration of this appointment visit was 30 minutes of face-to-face time with the patient.  Greater than 50% of this time was spent in counseling, explanation of diagnosis, planning of further management, and coordination of care.   Ellouise Newer, M.D.   CC: Dr. Quay Burow

## 2018-10-13 ENCOUNTER — Telehealth: Payer: Self-pay

## 2018-10-13 NOTE — Telephone Encounter (Signed)
Pt's husband stopped by the office today asking for list of side effects of Wellbutrin.    + pt was seen Friday, 10/10/2018, at which time Wellbutrin dose was doubled +  List printed from [email or web address] and given to husband.  Husband states that today is the first day of medication increase and he just wants to be "ahead of the game".  I advised that since pt has been on Wellbutrin for some time now, I did not feel that pt would experience harsh side effects from the increase.  Husband appreciative.

## 2018-10-14 ENCOUNTER — Encounter: Payer: Self-pay | Admitting: Neurology

## 2018-10-24 ENCOUNTER — Other Ambulatory Visit: Payer: Self-pay | Admitting: Internal Medicine

## 2018-10-24 MED ORDER — LOSARTAN POTASSIUM 50 MG PO TABS: 25.0000 mg | ORAL_TABLET | Freq: Every day | ORAL | 1 refills | Status: DC

## 2018-11-13 ENCOUNTER — Encounter: Payer: Self-pay | Admitting: Family Medicine

## 2018-11-13 ENCOUNTER — Ambulatory Visit (INDEPENDENT_AMBULATORY_CARE_PROVIDER_SITE_OTHER): Payer: Medicare Other | Admitting: Family Medicine

## 2018-11-13 ENCOUNTER — Ambulatory Visit: Payer: Self-pay | Admitting: *Deleted

## 2018-11-13 ENCOUNTER — Ambulatory Visit: Payer: Self-pay

## 2018-11-13 VITALS — BP 110/70 | HR 66 | Temp 97.9°F | Ht 64.5 in | Wt 209.1 lb

## 2018-11-13 DIAGNOSIS — H1132 Conjunctival hemorrhage, left eye: Secondary | ICD-10-CM

## 2018-11-13 NOTE — Telephone Encounter (Signed)
Pt husband called to state today his wife has a bloody area on her sclera. He know of no injury.. She does not take blood thinners, she takes an aspirin daily.  She is denying pain. Pt blood pressure today 132/85. She is able to see with and without her glasses. She has not been sick. Appointment scheduled per protocol. Care advice read to patient's husband. He verbalized understanding of all instructions  Reason for Disposition . Bleeding on white of the eye  Answer Assessment - Initial Assessment Questions 1. LOCATION: Location: "What's red, the eyeball or the outer eyelids?" (Note: when callers say the eye is red, they usually mean the sclera is red)       Blood under pupul in the white corner 2. REDNESS OF SCLERA: "Is the redness in one or both eyes?" "When did the redness start?"      Yes just left eye 3. ONSET: "When did the eye become red?" (e.g., hours, days)      today 4. EYELIDS: "Are the eyelids red or swollen?" If so, ask: "How much?"      no 5. VISION: "Is there any difficulty seeing clearly?"      Pt states she can see 6. ITCHING: "Does it feel itchy?" If so ask: "How bad is it" (e.g., Scale 1-10; or mild, moderate, severe)     No pain  7. PAIN: "Is there any pain? If so, ask: "How bad is it?" (e.g., Scale 1-10; or mild, moderate, severe)     no 8. CONTACT LENS: "Do you wear contacts?"     no 9. CAUSE: "What do you think is causing the redness?"    unsure 10. OTHER SYMPTOMS: "Do you have any other symptoms?" (e.g., fever, runny nose, cough, vomiting)       no  Protocols used: EYE - RED WITHOUT PUS-A-AH

## 2018-11-13 NOTE — Telephone Encounter (Signed)
Patient's husband, Mikki Santee, called to inform the doctor that he noticed blood in the left eye under the pupil of the patient. Stated there is no pain but was concerned and wanted to know what should be done. CB# [phone].

## 2018-11-13 NOTE — Telephone Encounter (Signed)
Triage call taken by another nurse and scheduled

## 2018-11-13 NOTE — Progress Notes (Signed)
Subjective:    Patient ID: Toni [name], female    DOB: October 30, 1934, 84 y.o.   MRN: [id number]  HPI  Ms. Kincannon is an 84 year old who is here today with her husband who reports noticing redness or "blood" in her left eye this morning.  She only noticed this after her caregiver mentioned this to her and she looked in the mirror. She denies pain in her eye, discharge, photophobia, visual disturbances, or foreign body sensation. She does not take blood thinners. No history of trauma. No recent coughing, vomiting, and symptom is not associated with valsalva.  She reports going to [facility] for eyeglasses and sees an eye provider at that location. She has not been evaluated for this symptoms today by her optometrist.  When the symptom was first noticed, she reports that her caregiver obtained a blood pressure reading which was WNL. BP has been well controlled by patient and her husband.    History of moderate dementia present. Patient was able to answer questions today and husband verified accuracy of her report.   Review of Systems  Constitutional: Negative for chills, diaphoresis and fever.  Eyes: Positive for redness. Negative for photophobia, pain, discharge, itching and visual disturbance.  Respiratory: Negative for cough and shortness of breath.   Cardiovascular: Negative for chest pain.  Gastrointestinal: Negative for abdominal pain, constipation and vomiting.  Psychiatric/Behavioral:       Pleasant and cooperative   Past Medical History:  Diagnosis Date  . Acute kidney injury ([place])   . C. difficile colitis 2016  . Colonic polyp   . Depression   . Diverticulosis   . GERD (gastroesophageal reflux disease)   . Headache    sinus  . Helicobacter pylori gastritis 1987  . Hepatic cyst   . Hyperlipemia   . Hypertension   . Pneumonia    hx of x 2   . PUD (peptic ulcer disease) 1987   with h pylori     Social History   Socioeconomic History  . Marital status:  Married    Spouse name: Not on file  . Number of children: 2  . Years of education: Not on file  . Highest education level: Not on file  Occupational History  . Not on file  Social Needs  . Financial resource strain: Not on file  . Food insecurity:    Worry: Not on file    Inability: Not on file  . Transportation needs:    Medical: Not on file    Non-medical: Not on file  Tobacco Use  . Smoking status: Former Smoker    Last attempt to quit: 12/10/1973    Years since quitting: 44.9  . Smokeless tobacco: Never Used  Substance and Sexual Activity  . Alcohol use: Yes    Alcohol/week: 0.0 standard drinks    Comment: 2 glasses of wine daily   . Drug use: No  . Sexual activity: Not on file  Lifestyle  . Physical activity:    Days per week: Not on file    Minutes per session: Not on file  . Stress: Not on file  Relationships  . Social connections:    Talks on phone: Not on file    Gets together: Not on file    Attends religious service: Not on file    Active member of club or organization: Not on file    Attends meetings of clubs or organizations: Not on file    Relationship status: Not  on file  . Intimate partner violence:    Fear of current or ex partner: Not on file    Emotionally abused: Not on file    Physically abused: Not on file    Forced sexual activity: Not on file  Other Topics Concern  . Not on file  Social History Narrative  . Not on file    Past Surgical History:  Procedure Laterality Date  . ABDOMINAL HYSTERECTOMY     BSO ; endometriosis; ? Appendectomy incidentally  . CHOLECYSTECTOMY  2008  . COLONOSCOPY  2003 & 2012   polyps; Dr Collene Mares  . CYSTOSCOPY  10/19/2016   Procedure: CYSTOSCOPY;  Surgeon: Nickie Retort, MD;  Location: [facility];  Service: Urology;;  . endoscopy gastritis  2008  . FLEXIBLE SIGMOIDOSCOPY N/A 05/31/2016   Procedure: FLEXIBLE SIGMOIDOSCOPY;  Surgeon: Juanita Craver, MD;  Location: [facility];  Service: Endoscopy;  Laterality: N/A;    . LAPAROSCOPIC PARTIAL COLECTOMY N/A 10/19/2016   Procedure: LAPAROSCOPIC ASSISTED SIGMIOD COLOECTOMY MOBILIZATION OF SPLEENIC [place];  Surgeon: Jackolyn Confer, MD;  Location: [facility];  Service: General;  Laterality: N/A;  . ROTATOR CUFF REPAIR     right  . TONSILLECTOMY AND ADENOIDECTOMY      Family History  Problem Relation Age of Onset  . Asthma Brother   . Hypertension Father   . Heart attack Father 64  . Leukemia Mother   . Stomach cancer Maternal Aunt   . Breast cancer Sister   . Thyroid disease Sister   . Stroke Neg Hx   . Diabetes Neg Hx     Allergies  Allergen Reactions  . Amlodipine Other (See Comments)    edema    Current Outpatient Medications on File Prior to Visit  Medication Sig Dispense Refill  . aspirin 81 MG tablet Take 81 mg by mouth daily.    Marland Kitchen atorvastatin (LIPITOR) 20 MG tablet Take 1 tablet (20 mg total) by mouth at bedtime. 90 tablet 3  . Benzethonium Chloride (ANTI-BACTERIAL CLEANSING WIPES) 0.18 % MISC Apply Topically. 25 each 1  . buPROPion (WELLBUTRIN XL) 150 MG 24 hr tablet Take 1 tablet twice a day 180 tablet 3  . cyanocobalamin 500 MCG tablet Take 500 mcg by mouth daily.    . cycloSPORINE (RESTASIS) 0.05 % ophthalmic emulsion Place 1 drop into both eyes 2 (two) times daily.    Marland Kitchen donepezil (ARICEPT) 10 MG tablet Take 1 tablet (10 mg total) by mouth at bedtime. 90 tablet 3  . furosemide (LASIX) 20 MG tablet Take 1 tablet (20 mg total) by mouth daily. 90 tablet 1  . isosorbide mononitrate (IMDUR) 60 MG 24 hr tablet Take 1 tablet (60 mg total) by mouth daily. 90 tablet 3  . losartan (COZAAR) 50 MG tablet Take 0.5 tablets (25 mg total) by mouth daily. 90 tablet 1  . memantine (NAMENDA) 10 MG tablet Take 1 tablet twice a day 180 tablet 3  . metoprolol tartrate (LOPRESSOR) 50 MG tablet Take 1 tablet (50 mg total) by mouth 2 (two) times daily. 180 tablet 3  . mirtazapine (REMERON) 30 MG tablet TAKE 1 TABLET BY MOUTH AT  BEDTIME 90 tablet 1   . nystatin (MYCOSTATIN/NYSTOP) powder Apply topically 4 (four) times daily. (Patient not taking: Reported on 10/10/2018) 45 g 0  . nystatin cream (MYCOSTATIN) Apply 1 application topically 2 (two) times daily. (Patient not taking: Reported on 10/10/2018) 30 g 0  . Ostomy Supplies (CLOSED-END COLOSTOMY POUCH) MISC Use as directed 20 each  0  . Ostomy Supplies (ODOR SPRAY) LIQD uad 118 mL 11   No current facility-administered medications on file prior to visit.     BP 110/70 (BP Location: Left Arm, Patient Position: Sitting, Cuff Size: Normal)   Pulse 66   Temp 97.9 F (36.6 C) (Oral)   Ht 5' 4.5" (1.638 m)   Wt 209 lb 1.3 oz (94.8 kg)   SpO2 97%   BMI 35.33 kg/m        Objective:   Physical Exam  Constitutional: She appears well-developed and well-nourished.  Eyes: Pupils are equal, round, and reactive to light. Right eye exhibits no discharge. Left eye exhibits no discharge.  Left eye exhibits a flat, red area on ocular surface. Patient denies visual changes and was able to read while wearing glasses. Gross visual acuity WNL  Cardiovascular: Normal rate and normal heart sounds.  Pulmonary/Chest: Effort normal and breath sounds normal.  Abdominal: Soft.  Lymphadenopathy:    She has no cervical adenopathy.  Neurological: She is alert.  Normal mood and affect. Behavior is normal  Skin: Skin is warm and dry. No rash noted.       Assessment & Plan:  1. Subconjunctival hemorrhage of left eye Exam and history are most consistent with subconjunctival hemorrhage. Exam is reassuring today. She does not use blood thinners and has not experienced this previously. No other unusual bruising/bleeding present.  No visual changes present. We reviewed signs and symptoms of subconjunctival hemorrhage and that this will likely resolve within one to two weeks.  Advised close follow up if no improvement or she has symptoms such as pain in her eye or new bleeding. Further advised her to seek medical  attention immediately if she notices any vision changes, photophobia, HA, or unusual bruising or bleeding.  She was also advised to follow up for regular eye examination which is now due.  Patient and husband voiced understanding and agreed with plan.  Delano Metz, FNP-C

## 2018-11-13 NOTE — Patient Instructions (Signed)
Please monitor for any change in symptoms such as change in vision, pain, foreign body sensation, or drainage.  If you experience these, please seek evaluation by your eye doctor.   Subconjunctival Hemorrhage Subconjunctival hemorrhage is bleeding that happens between the white part of your eye (sclera) and the clear membrane that covers the outside of your eye (conjunctiva). There are many tiny blood vessels near the surface of your eye. A subconjunctival hemorrhage happens when one or more of these vessels breaks and bleeds, causing a red patch to appear on your eye. This is similar to a bruise. Depending on the amount of bleeding, the red patch may only cover a small area of your eye or it may cover the entire visible part of the sclera. If a lot of blood collects under the conjunctiva, there may also be swelling. Subconjunctival hemorrhages do not affect your vision or cause pain, but your eye may feel irritated if there is swelling. Subconjunctival hemorrhages usually do not require treatment, and they disappear on their own within two weeks. What are the causes? This condition may be caused by:  Mild trauma, such as rubbing your eye too hard.  Severe trauma or blunt injuries.  Coughing, sneezing, or vomiting.  Straining, such as when lifting a heavy object.  High blood pressure.  Recent eye surgery.  A history of diabetes.  Certain medicines, especially blood thinners (anticoagulants).  Other conditions, such as eye tumors, bleeding disorders, or blood vessel abnormalities.  Subconjunctival hemorrhages can happen without an obvious cause. What are the signs or symptoms? Symptoms of this condition include:  A bright red or dark red patch on the white part of the eye. ? The red area may spread out to cover a larger area of the eye before it goes away. ? The red area may turn brownish-yellow before it goes away.  Swelling.  Mild eye irritation.  How is this diagnosed? This  condition is diagnosed with a physical exam. If your subconjunctival hemorrhage was caused by trauma, your health care provider may refer you to an eye specialist (ophthalmologist) or another specialist to check for other injuries. You may have other tests, including:  An eye exam.  A blood pressure check.  Blood tests to check for bleeding disorders.  If your subconjunctival hemorrhage was caused by trauma, X-rays or a CT scan may be done to check for other injuries. How is this treated? Usually, no treatment is needed. Your health care provider may recommend eye drops or cold compresses to help with discomfort. Follow these instructions at home:  Take over-the-counter and prescription medicines only as directed by your health care provider.  Use eye drops or cold compresses to help with discomfort as directed by your health care provider.  Avoid activities, things, and environments that may irritate or injure your eye.  Keep all follow-up visits as told by your health care provider. This is important. Contact a health care provider if:  You have pain in your eye.  The bleeding does not go away within 3 weeks.  You keep getting new subconjunctival hemorrhages. Get help right away if:  Your vision changes or you have difficulty seeing.  You suddenly develop severe sensitivity to light.  You develop a severe headache, persistent vomiting, confusion, or abnormal tiredness (lethargy).  Your eye seems to bulge or protrude from your eye socket.  You develop unexplained bruises on your body.  You have unexplained bleeding in another area of your body. This information is not  intended to replace advice given to you by your health care provider. Make sure you discuss any questions you have with your health care provider. Document Released: 11/26/2005 Document Revised: 07/22/2016 Document Reviewed: 02/02/2015 Elsevier Interactive Patient Education  [employer].

## 2019-02-14 ENCOUNTER — Other Ambulatory Visit: Payer: Self-pay | Admitting: Internal Medicine

## 2019-02-24 NOTE — Progress Notes (Signed)
Subjective:    Patient ID: Toni [name], female    DOB: June 28, 1934, 85 y.o.   MRN: [id number]  HPI The patient is here for follow up.  Hypertension: Since she was here last we decreased her medication.  Her husband monitors her blood pressure carefully at home and more recently he has not been giving her medication on a daily basis and her blood pressure has still been controlled.  He wonders if she still needs medication.   She is compliant with a low sodium diet.  She denies chest pain, palpitations, shortness of breath and regular headaches. She is not exercising regularly.    Hyperlipidemia: She is taking her medication daily. She is compliant with a low fat/cholesterol diet. She is not exercising regularly. She denies myalgias.   Prediabetes:  She is compliant with a low sugar/carbohydrate diet.  She is not exercising regularly.   CKD, leg edema: She is taking Lasix 20 mg daily.  Her leg swelling overall is controlled.  She was wearing the compression socks daily, but stopped wearing them-she does not like them.  She is compliant with a low-sodium diet.  She tries to elevate her legs when sitting.  Depression: She is taking her medication daily as prescribed. She denies any side effects from the medication. She feels her depression is well controlled and she is happy with her current dose of medication.     Colostomy:  It is doing well.    Dementia;  She sees Dr Delice Lesch.  She is on aricept and namenda.       Medications and allergies reviewed with patient and updated if appropriate.  Patient Active Problem List   Diagnosis Date Noted  . Urinary incontinence 08/26/2018  . Skin irritation 06/20/2018  . Knee pain, bilateral 04/09/2018  . Prediabetes 12/19/2017  . Edema 01/30/2017  . Colostomy status ([place]) 01/07/2017  . NSTEMI (non-ST elevated myocardial infarction) ([place]) 10/26/2016  . S/P laparoscopic colectomy 10/19/2016  . Colovesical fistula 08/27/2016  . Chronic  fatigue 03/30/2016  . Moderate dementia with behavioral disturbance ([place]) 03/22/2016  . Hyperlipidemia 03/22/2016  . Depression 09/21/2015  . History of stroke 09/21/2015  . CKD (chronic kidney disease)   . Lesion of right native kidney 04/13/2015  . Diastolic CHF ([place]) 53/97/6734  . History of Clostridium difficile infection 04/05/2015  . Essential hypertension 03/23/2015  . Abnormal CT of brain 03/04/2015  . Cervicogenic headache 01/22/2012  . Diverticulosis of large intestine 10/28/2009  . DIVERTICULITIS, HX OF 10/28/2009  . COLONIC POLYPS, HX OF 03/07/2009  . Anxiety 07/28/2008  . DRY EYE SYNDROME 03/12/2008  . HEPATIC CYST 04/21/2007  . MIGRAINES, HX OF 04/21/2007    Current Outpatient Medications on File Prior to Visit  Medication Sig Dispense Refill  . aspirin 81 MG tablet Take 81 mg by mouth daily.    Marland Kitchen atorvastatin (LIPITOR) 20 MG tablet Take 1 tablet (20 mg total) by mouth at bedtime. 90 tablet 3  . Benzethonium Chloride (ANTI-BACTERIAL CLEANSING WIPES) 0.18 % MISC Apply Topically. 25 each 1  . buPROPion (WELLBUTRIN XL) 150 MG 24 hr tablet Take 1 tablet twice a day 180 tablet 3  . cyanocobalamin 500 MCG tablet Take 500 mcg by mouth daily.    Marland Kitchen donepezil (ARICEPT) 10 MG tablet Take 1 tablet (10 mg total) by mouth at bedtime. 90 tablet 3  . furosemide (LASIX) 20 MG tablet Take 1 tablet (20 mg total) by mouth daily. 90 tablet 1  . isosorbide mononitrate (  IMDUR) 60 MG 24 hr tablet Take 1 tablet (60 mg total) by mouth daily. 90 tablet 3  . memantine (NAMENDA) 10 MG tablet Take 1 tablet twice a day 180 tablet 3  . metoprolol tartrate (LOPRESSOR) 50 MG tablet Take 1 tablet (50 mg total) by mouth 2 (two) times daily. 180 tablet 3  . mirtazapine (REMERON) 30 MG tablet TAKE 1 TABLET BY MOUTH AT  BEDTIME 90 tablet 1  . nystatin (MYCOSTATIN/NYSTOP) powder Apply topically 4 (four) times daily. 45 g 0  . nystatin cream (MYCOSTATIN) Apply 1 application topically 2 (two) times daily. 30  g 0  . Ostomy Supplies (CLOSED-END COLOSTOMY POUCH) MISC Use as directed 20 each 0  . Ostomy Supplies (ODOR SPRAY) LIQD uad 118 mL 11   No current facility-administered medications on file prior to visit.     Past Medical History:  Diagnosis Date  . Acute kidney injury ([place])   . C. difficile colitis 2016  . Colonic polyp   . Depression   . Diverticulosis   . GERD (gastroesophageal reflux disease)   . Headache    sinus  . Helicobacter pylori gastritis 1987  . Hepatic cyst   . Hyperlipemia   . Hypertension   . Pneumonia    hx of x 2   . PUD (peptic ulcer disease) 1987   with h pylori    Past Surgical History:  Procedure Laterality Date  . ABDOMINAL HYSTERECTOMY     BSO ; endometriosis; ? Appendectomy incidentally  . CHOLECYSTECTOMY  2008  . COLONOSCOPY  2003 & 2012   polyps; Dr Collene Mares  . CYSTOSCOPY  10/19/2016   Procedure: CYSTOSCOPY;  Surgeon: Nickie Retort, MD;  Location: [facility];  Service: Urology;;  . endoscopy gastritis  2008  . FLEXIBLE SIGMOIDOSCOPY N/A 05/31/2016   Procedure: FLEXIBLE SIGMOIDOSCOPY;  Surgeon: Juanita Craver, MD;  Location: [facility];  Service: Endoscopy;  Laterality: N/A;  . LAPAROSCOPIC PARTIAL COLECTOMY N/A 10/19/2016   Procedure: LAPAROSCOPIC ASSISTED SIGMIOD COLOECTOMY MOBILIZATION OF SPLEENIC [place];  Surgeon: Jackolyn Confer, MD;  Location: [facility];  Service: General;  Laterality: N/A;  . ROTATOR CUFF REPAIR     right  . TONSILLECTOMY AND ADENOIDECTOMY      Social History   Socioeconomic History  . Marital status: Married    Spouse name: Not on file  . Number of children: 2  . Years of education: Not on file  . Highest education level: Not on file  Occupational History  . Not on file  Social Needs  . Financial resource strain: Not on file  . Food insecurity:    Worry: Not on file    Inability: Not on file  . Transportation needs:    Medical: Not on file    Non-medical: Not on file  Tobacco Use  . Smoking status:  Former Smoker    Last attempt to quit: 12/10/1973    Years since quitting: 45.2  . Smokeless tobacco: Never Used  Substance and Sexual Activity  . Alcohol use: Yes    Alcohol/week: 0.0 standard drinks    Comment: 2 glasses of wine daily   . Drug use: No  . Sexual activity: Not on file  Lifestyle  . Physical activity:    Days per week: Not on file    Minutes per session: Not on file  . Stress: Not on file  Relationships  . Social connections:    Talks on phone: Not on file    Gets together: Not on  file    Attends religious service: Not on file    Active member of club or organization: Not on file    Attends meetings of clubs or organizations: Not on file    Relationship status: Not on file  Other Topics Concern  . Not on file  Social History Narrative  . Not on file    Family History  Problem Relation Age of Onset  . Asthma Brother   . Hypertension Father   . Heart attack Father 32  . Leukemia Mother   . Stomach cancer Maternal Aunt   . Breast cancer Sister   . Thyroid disease Sister   . Stroke Neg Hx   . Diabetes Neg Hx     Review of Systems  Constitutional: Negative for appetite change, chills and fever.  Respiratory: Negative for cough, shortness of breath and wheezing.   Cardiovascular: Positive for leg swelling (controlled with lasix). Negative for chest pain and palpitations.  Neurological: Negative for light-headedness and headaches.  Psychiatric/Behavioral: Positive for dysphoric mood. Negative for sleep disturbance. The patient is nervous/anxious.        Objective:   Vitals:   02/25/19 1413  BP: 120/60  Pulse: 71  Resp: 16  Temp: 98.1 F (36.7 C)  SpO2: 97%   BP Readings from Last 3 Encounters:  02/25/19 120/60  11/13/18 110/70  10/10/18 108/60   Wt Readings from Last 3 Encounters:  02/25/19 208 lb (94.3 kg)  11/13/18 209 lb 1.3 oz (94.8 kg)  10/10/18 208 lb (94.3 kg)   Body mass index is 35.15 kg/m.   Physical Exam    Constitutional:  Appears well-developed and well-nourished. No distress.  HENT:  Head: Normocephalic and atraumatic.  Neck: Neck supple. No tracheal deviation present. No thyromegaly present.  No cervical lymphadenopathy Cardiovascular: Normal rate, regular rhythm and normal heart sounds.   No murmur heard. No carotid bruit .  Mild pitting edema bilateral lower extremities edema Pulmonary/Chest: Effort normal and breath sounds normal. No respiratory distress. No has no wheezes. No rales.  Skin: Skin is warm and dry. Not diaphoretic.  Psychiatric: Normal mood and affect. Behavior is normal.      Assessment & Plan:    See Problem List for Assessment and Plan of chronic medical problems.

## 2019-02-25 ENCOUNTER — Encounter: Payer: Self-pay | Admitting: Internal Medicine

## 2019-02-25 ENCOUNTER — Other Ambulatory Visit (INDEPENDENT_AMBULATORY_CARE_PROVIDER_SITE_OTHER): Payer: Medicare Other

## 2019-02-25 ENCOUNTER — Other Ambulatory Visit: Payer: Self-pay

## 2019-02-25 ENCOUNTER — Ambulatory Visit (INDEPENDENT_AMBULATORY_CARE_PROVIDER_SITE_OTHER): Payer: Medicare Other | Admitting: Internal Medicine

## 2019-02-25 ENCOUNTER — Other Ambulatory Visit: Payer: Medicare Other

## 2019-02-25 VITALS — BP 120/60 | HR 71 | Temp 98.1°F | Resp 16 | Ht 64.5 in | Wt 208.0 lb

## 2019-02-25 DIAGNOSIS — I10 Essential (primary) hypertension: Secondary | ICD-10-CM

## 2019-02-25 DIAGNOSIS — F0391 Unspecified dementia with behavioral disturbance: Secondary | ICD-10-CM | POA: Diagnosis not present

## 2019-02-25 DIAGNOSIS — R7303 Prediabetes: Secondary | ICD-10-CM

## 2019-02-25 DIAGNOSIS — N189 Chronic kidney disease, unspecified: Secondary | ICD-10-CM | POA: Diagnosis not present

## 2019-02-25 DIAGNOSIS — E7849 Other hyperlipidemia: Secondary | ICD-10-CM | POA: Diagnosis not present

## 2019-02-25 DIAGNOSIS — F320 Major depressive disorder, single episode, mild: Secondary | ICD-10-CM | POA: Diagnosis not present

## 2019-02-25 DIAGNOSIS — F03B18 Unspecified dementia, moderate, with other behavioral disturbance: Secondary | ICD-10-CM

## 2019-02-25 LAB — CBC WITH DIFFERENTIAL/PLATELET
Basophils Absolute: 0.1 10*3/uL (ref 0.0–0.1)
Basophils Relative: 1 % (ref 0.0–3.0)
EOS ABS: 0.2 10*3/uL (ref 0.0–0.7)
Eosinophils Relative: 2.5 % (ref 0.0–5.0)
HCT: 40.1 % (ref 36.0–46.0)
Hemoglobin: 14 g/dL (ref 12.0–15.0)
Lymphocytes Relative: 20.1 % (ref 12.0–46.0)
Lymphs Abs: 1.9 10*3/uL (ref 0.7–4.0)
MCHC: 34.9 g/dL (ref 30.0–36.0)
MCV: 88.1 fl (ref 78.0–100.0)
MONO ABS: 0.7 10*3/uL (ref 0.1–1.0)
Monocytes Relative: 7.6 % (ref 3.0–12.0)
Neutro Abs: 6.6 10*3/uL (ref 1.4–7.7)
Neutrophils Relative %: 68.8 % (ref 43.0–77.0)
Platelets: 359 10*3/uL (ref 150.0–400.0)
RBC: 4.55 Mil/uL (ref 3.87–5.11)
RDW: 14.1 % (ref 11.5–15.5)
WBC: 9.6 10*3/uL (ref 4.0–10.5)

## 2019-02-25 LAB — COMPREHENSIVE METABOLIC PANEL
ALT: 14 U/L (ref 0–35)
AST: 13 U/L (ref 0–37)
Albumin: 4.1 g/dL (ref 3.5–5.2)
Alkaline Phosphatase: 117 U/L (ref 39–117)
BUN: 28 mg/dL — AB (ref 6–23)
CO2: 32 mEq/L (ref 19–32)
Calcium: 9.1 mg/dL (ref 8.4–10.5)
Chloride: 98 mEq/L (ref 96–112)
Creatinine, Ser: 1.76 mg/dL — ABNORMAL HIGH (ref 0.40–1.20)
GFR: 27.57 mL/min — ABNORMAL LOW (ref >60.00)
GLUCOSE: 124 mg/dL — AB (ref 70–99)
POTASSIUM: 3.5 meq/L (ref 3.5–5.1)
SODIUM: 140 meq/L (ref 135–145)
Total Bilirubin: 0.6 mg/dL (ref 0.2–1.2)
Total Protein: 7.1 g/dL (ref 6.0–8.3)

## 2019-02-25 LAB — HEMOGLOBIN A1C: HEMOGLOBIN A1C: 5.7 % (ref 4.6–6.5)

## 2019-02-25 LAB — TSH: TSH: 2.08 u[IU]/mL (ref 0.35–4.50)

## 2019-02-25 NOTE — Assessment & Plan Note (Signed)
Check A1c Continue low sugar/carbohydrate diet

## 2019-02-25 NOTE — Assessment & Plan Note (Addendum)
Chronic leg edema Taking Lasix 20 mg daily We will discontinue losartan since blood pressure is good-has been will monitor blood pressure CMP Encouraged increased fluids

## 2019-02-25 NOTE — Assessment & Plan Note (Signed)
Following with Dr. Delice Lesch On Aricept and Namenda Her husband nods his head that her memory has gotten a little bit worse Continue above

## 2019-02-25 NOTE — Assessment & Plan Note (Signed)
Not fasting so we will hold off on checking lipid panel CMP Continue statin

## 2019-02-25 NOTE — Patient Instructions (Addendum)
  Tests ordered today. Your results will be released to [place] (or called to you) after review, usually within 72hours after test completion. If any changes need to be made, you will be notified at that same time.   Medications reviewed and updated.  Changes include :   Stop losartan.   Monitor your blood pressure.   Please followup in 6 months

## 2019-02-25 NOTE — Assessment & Plan Note (Signed)
Fairly stable Continue current medication at current dose

## 2019-02-28 ENCOUNTER — Other Ambulatory Visit: Payer: Self-pay | Admitting: Internal Medicine

## 2019-03-07 ENCOUNTER — Other Ambulatory Visit: Payer: Self-pay | Admitting: Internal Medicine

## 2019-03-24 ENCOUNTER — Encounter: Payer: Medicare Other | Admitting: Internal Medicine

## 2019-03-24 ENCOUNTER — Ambulatory Visit: Payer: Self-pay

## 2019-03-24 ENCOUNTER — Telehealth: Payer: Self-pay | Admitting: Internal Medicine

## 2019-03-24 ENCOUNTER — Other Ambulatory Visit: Payer: Self-pay

## 2019-03-24 ENCOUNTER — Ambulatory Visit (INDEPENDENT_AMBULATORY_CARE_PROVIDER_SITE_OTHER)
Admission: RE | Admit: 2019-03-24 | Discharge: 2019-03-24 | Disposition: A | Discharge status: Home / Self Care | Payer: Medicare Other | Source: Ambulatory Visit | Attending: Internal Medicine | Admitting: Internal Medicine

## 2019-03-24 ENCOUNTER — Ambulatory Visit (INDEPENDENT_AMBULATORY_CARE_PROVIDER_SITE_OTHER): Payer: Medicare Other | Admitting: Internal Medicine

## 2019-03-24 ENCOUNTER — Encounter: Payer: Self-pay | Admitting: Internal Medicine

## 2019-03-24 VITALS — BP 122/70 | HR 70 | Temp 98.4°F | Resp 16 | Ht 64.5 in | Wt 208.0 lb

## 2019-03-24 DIAGNOSIS — R2689 Other abnormalities of gait and mobility: Secondary | ICD-10-CM | POA: Diagnosis not present

## 2019-03-24 DIAGNOSIS — M25532 Pain in left wrist: Secondary | ICD-10-CM

## 2019-03-24 DIAGNOSIS — S52352A Displaced comminuted fracture of shaft of radius, left arm, initial encounter for closed fracture: Secondary | ICD-10-CM | POA: Diagnosis not present

## 2019-03-24 NOTE — Telephone Encounter (Signed)
Patient's husband called and says the patient fell yesterday and hurt her left wrist. He says the wrist is bruised and swollen more up the arm than the hand swelling. He says she can't grip. He says he wrapped it with an ace bandage yesterday and last night, but took it off because it was swelling more and painful. He says the swelling is not that bad, but you can see it's swollen. I asked does he have the capability to do a virtual visit, he says he has a lap top. I placed patient on hold and called the office, spoke to [place], [facility] who says to let the husband know someone will call back with how Dr. Quay Burow wants to proceed. I advised the husband, he verbalized understanding.  Answer Assessment - Initial Assessment Questions 1. ONSET: "When did the swelling start?" (e.g., minutes, hours, days, weeks)     Yesterday 2. LOCATION: "What part of the wrist is swollen?"  "Are both wrists swollen or just one wrist?"     Left wrist 3. SEVERITY: "How bad is the swelling?"    - BALL OR LUMP: small ball or lump   - SKIN ONLY: localized; puffy or swollen area or patch of skin   - MILD JOINT SWELLING: joint feels or looks mildly swollen or puffy   - MODERATE JOINT SWELLING: moderate joint swelling; looks swollen   - SEVERE JOINT SWELLING:  severe joint swelling; can barely bend or move joint     Moderate 4. RECURRENT SYMPTOM: "Have you had wrist swelling before?" If so, ask: "When was the last time?" "What happened that time?"     N/A 5. CAUSE: "What do you think is causing the wrist swelling?" (e.g., arthritis, ganglion cyst, insect bite, recent injury)     From a fall on yesterday morning 6. OTHER SYMPTOMS: "Do you have any other symptoms?" (e.g., fever, hand pain)     Pain, swelling to left hand 7. PREGNANCY: "Is there any chance you are pregnant?" "When was your last menstrual period?"     N/A  Answer Assessment - Initial Assessment Questions 1. MECHANISM: "How did the injury happen?"     Fall  2.  ONSET: "When did the injury happen?" (Minutes or hours ago)      Yesterday 3. APPEARANCE of INJURY: "What does the injury look like?"     Bruised and swollen 4. SEVERITY: "Can you use the hand normally?" "Can you bend your fingers into a ball and then fully open them?"     It's painful to use her hand 5. SIZE: For cuts, bruises, or swelling, ask: "How large is it?" (e.g., inches or centimeters;  entire hand or wrist)      N/A 6. PAIN: "Is there pain?" If so, ask: "How bad is the pain?"  (Scale 1-10; or mild, moderate, severe)     She says it hurts 7. TETANUS: For any breaks in the skin, ask: "When was the last tetanus booster?"     N/A 8. OTHER SYMPTOMS: "Do you have any other symptoms?"   Swelling 9. PREGNANCY: "Is there any chance you are pregnant?" "When was your last menstrual period?"     No  Protocols used: WRIST SWELLING-A-AH, HAND AND WRIST INJURY-A-AH

## 2019-03-24 NOTE — Telephone Encounter (Signed)
Tried reaching out to patient 3 times. No answer and no VM. Will try back

## 2019-03-24 NOTE — Assessment & Plan Note (Signed)
Poor balance, falls She states her knees are weak, which are likely part of her balance issues.  She is also very sedentary and likely deconditioned At this point due to coronavirus situation we cannot order physical therapy, but in the near future this would be beneficial At this time would avoid cane since that part of the reason she fell yesterday.  Unable to use a walker at this time because of wrist pain Discussed exercises she could do at home that may help strengthen her legs-increase walking, leg exercises while sitting Her husband will work on increasing her exercises and let me know in the near future what else they would like to do

## 2019-03-24 NOTE — Telephone Encounter (Signed)
Husband is aware of appointment with Tamala Julian tomorrow at 2:30 and states they will be here.

## 2019-03-24 NOTE — Patient Instructions (Signed)
For your wrist:  Continue Tylenol up to 3000 mg a day for your pain.  If you need something stronger please let me know via mychart.    Go downstairs for an x-ray.  We will call you with the results.     For your balance:  Start exercising more - walk around your house more - several times a day.   Start doing leg exercises and go from sitting to standing to help strengthen your legs.   We can consider PT after the coronavirus situation has resolved.

## 2019-03-24 NOTE — Telephone Encounter (Signed)
Report called by [facility] radiology.  Call placed to [facility] office Spoke to [place].  Will route to office.

## 2019-03-24 NOTE — Telephone Encounter (Signed)
See if we can set up a virtual visit for 1 or 130.  Let him know depending on what it looks like we may need to have her come in and have an x-ray.

## 2019-03-24 NOTE — Progress Notes (Signed)
Subjective:    Patient ID: Toni [name], female    DOB: [birth year], 85 y.o.   MRN: [id number]  HPI The patient is here for an acute visit.  She is here today with her husband who provides most of the history due to her dementia.   Left wrist pain: She fell yesterday at home and injured her left wrist.  She got up from a chair and had just started using a cane and lost her balance and fell onto her left wrist.  The distal arm, wrist hand and proximal fingers are swollen.  There is some mild bruising.  She denies any numbness or tingling in the fingers or hand.  She is able to move the fingers, but it does hurt a little.  She cannot move the wrist without significant pain.  She has been taking Tylenol for the pain.  She denies any other injuries from the fall.  Poor balance: Her husband states that her balance is very poor.  She is very sedentary.  He was wondering what could be done about this.  She states her knees are weak.  He does feel that a lot of her balance issues are related to her knees.  She has never seen anyone regarding her knees.  She does not have pain, but they just give out.  She is a poor historian because of her dementia.  He was unsure about getting a walker.  She just started using a cane that he had given her 2 days ago and he thinks that is part of the reason why she fell.     Medications and allergies reviewed with patient and updated if appropriate.  Patient Active Problem List   Diagnosis Date Noted  . Left wrist pain 03/24/2019  . Urinary incontinence 08/26/2018  . Skin irritation 06/20/2018  . Knee pain, bilateral 04/09/2018  . Prediabetes 12/19/2017  . Edema 01/30/2017  . Colostomy status ([place]) 01/07/2017  . NSTEMI (non-ST elevated myocardial infarction) ([place]) 10/26/2016  . S/P laparoscopic colectomy 10/19/2016  . Colovesical fistula 08/27/2016  . Chronic fatigue 03/30/2016  . Moderate dementia with behavioral disturbance ([place]) 03/22/2016  .  Hyperlipidemia 03/22/2016  . Depression 09/21/2015  . History of stroke 09/21/2015  . CKD (chronic kidney disease)   . Lesion of right native kidney 04/13/2015  . Diastolic CHF ([place]) 36/62/9476  . History of Clostridium difficile infection 04/05/2015  . Essential hypertension 03/23/2015  . Abnormal CT of brain 03/04/2015  . Cervicogenic headache 01/22/2012  . Diverticulosis of large intestine 10/28/2009  . DIVERTICULITIS, HX OF 10/28/2009  . COLONIC POLYPS, HX OF 03/07/2009  . Anxiety 07/28/2008  . DRY EYE SYNDROME 03/12/2008  . HEPATIC CYST 04/21/2007  . MIGRAINES, HX OF 04/21/2007    Current Outpatient Medications on File Prior to Visit  Medication Sig Dispense Refill  . aspirin 81 MG tablet Take 81 mg by mouth daily.    Marland Kitchen atorvastatin (LIPITOR) 20 MG tablet Take 1 tablet (20 mg total) by mouth at bedtime. 90 tablet 3  . Benzethonium Chloride (ANTI-BACTERIAL CLEANSING WIPES) 0.18 % MISC Apply Topically. 25 each 1  . buPROPion (WELLBUTRIN XL) 150 MG 24 hr tablet Take 1 tablet twice a day 180 tablet 3  . cyanocobalamin 500 MCG tablet Take 500 mcg by mouth daily.    Marland Kitchen donepezil (ARICEPT) 10 MG tablet Take 1 tablet (10 mg total) by mouth at bedtime. 90 tablet 3  . furosemide (LASIX) 20 MG tablet TAKE 1 TABLET BY  MOUTH  DAILY 90 tablet 1  . isosorbide mononitrate (IMDUR) 60 MG 24 hr tablet TAKE 1 TABLET BY MOUTH  DAILY 90 tablet 3  . memantine (NAMENDA) 10 MG tablet Take 1 tablet twice a day 180 tablet 3  . metoprolol tartrate (LOPRESSOR) 50 MG tablet Take 1 tablet (50 mg total) by mouth 2 (two) times daily. 180 tablet 3  . mirtazapine (REMERON) 30 MG tablet TAKE 1 TABLET BY MOUTH AT  BEDTIME 90 tablet 1  . nystatin (MYCOSTATIN/NYSTOP) powder Apply topically 4 (four) times daily. 45 g 0  . nystatin cream (MYCOSTATIN) Apply 1 application topically 2 (two) times daily. 30 g 0  . Ostomy Supplies (CLOSED-END COLOSTOMY POUCH) MISC Use as directed 20 each 0  . Ostomy Supplies (ODOR SPRAY)  LIQD uad 118 mL 11   No current facility-administered medications on file prior to visit.     Past Medical History:  Diagnosis Date  . Acute kidney injury ([place])   . C. difficile colitis 2016  . Colonic polyp   . Depression   . Diverticulosis   . GERD (gastroesophageal reflux disease)   . Headache    sinus  . Helicobacter pylori gastritis 1987  . Hepatic cyst   . Hyperlipemia   . Hypertension   . Pneumonia    hx of x 2   . PUD (peptic ulcer disease) 1987   with h pylori    Past Surgical History:  Procedure Laterality Date  . ABDOMINAL HYSTERECTOMY     BSO ; endometriosis; ? Appendectomy incidentally  . CHOLECYSTECTOMY  2008  . COLONOSCOPY  2003 & 2012   polyps; Dr Collene Mares  . CYSTOSCOPY  10/19/2016   Procedure: CYSTOSCOPY;  Surgeon: Nickie Retort, MD;  Location: [facility];  Service: Urology;;  . endoscopy gastritis  2008  . FLEXIBLE SIGMOIDOSCOPY N/A 05/31/2016   Procedure: FLEXIBLE SIGMOIDOSCOPY;  Surgeon: Juanita Craver, MD;  Location: [facility];  Service: Endoscopy;  Laterality: N/A;  . LAPAROSCOPIC PARTIAL COLECTOMY N/A 10/19/2016   Procedure: LAPAROSCOPIC ASSISTED SIGMIOD COLOECTOMY MOBILIZATION OF SPLEENIC [place];  Surgeon: Jackolyn Confer, MD;  Location: [facility];  Service: General;  Laterality: N/A;  . ROTATOR CUFF REPAIR     right  . TONSILLECTOMY AND ADENOIDECTOMY      Social History   Socioeconomic History  . Marital status: Married    Spouse name: Not on file  . Number of children: 2  . Years of education: Not on file  . Highest education level: Not on file  Occupational History  . Not on file  Social Needs  . Financial resource strain: Not on file  . Food insecurity:    Worry: Not on file    Inability: Not on file  . Transportation needs:    Medical: Not on file    Non-medical: Not on file  Tobacco Use  . Smoking status: Former Smoker    Last attempt to quit: 12/10/1973    Years since quitting: 45.3  . Smokeless tobacco: Never Used   Substance and Sexual Activity  . Alcohol use: Yes    Alcohol/week: 0.0 standard drinks    Comment: 2 glasses of wine daily   . Drug use: No  . Sexual activity: Not on file  Lifestyle  . Physical activity:    Days per week: Not on file    Minutes per session: Not on file  . Stress: Not on file  Relationships  . Social connections:    Talks on phone: Not on  file    Gets together: Not on file    Attends religious service: Not on file    Active member of club or organization: Not on file    Attends meetings of clubs or organizations: Not on file    Relationship status: Not on file  Other Topics Concern  . Not on file  Social History Narrative  . Not on file    Family History  Problem Relation Age of Onset  . Asthma Brother   . Hypertension Father   . Heart attack Father 66  . Leukemia Mother   . Stomach cancer Maternal Aunt   . Breast cancer Sister   . Thyroid disease Sister   . Stroke Neg Hx   . Diabetes Neg Hx     Review of Systems  Constitutional: Negative for fever.  Respiratory: Negative for cough, shortness of breath and wheezing.   Musculoskeletal: Positive for gait problem and joint swelling (Left wrist/hand).       No new pain-knee weakness  Skin: Positive for color change (Bruising left wrist/hand). Negative for wound.  Neurological: Positive for weakness (Knees only). Negative for numbness (In the left hand/fingers).       Objective:   Vitals:   03/24/19 1412  BP: 122/70  Pulse: 70  Resp: 16  Temp: 98.4 F (36.9 C)  SpO2: 97%   BP Readings from Last 3 Encounters:  03/24/19 122/70  02/25/19 120/60  11/13/18 110/70   Wt Readings from Last 3 Encounters:  03/24/19 208 lb (94.3 kg)  02/25/19 208 lb (94.3 kg)  11/13/18 209 lb 1.3 oz (94.8 kg)   Body mass index is 35.15 kg/m.   Physical Exam Constitutional:      General: She is not in acute distress.    Appearance: Normal appearance. She is obese. She is not ill-appearing.  HENT:     Head:  Normocephalic and atraumatic.  Musculoskeletal:     Comments: Left wrist and hand in the fingers is mildly swollen with some bruising.  Neurovascularly intact.  No pain with palpation of fingers or hand.  Pain with palpation lateral wrist.  Decreased range of motion of wrist secondary to pain.  Normal range of motion of fingers.  No swelling, pain, bruising or decreased range of motion proximal to wrist and forearm/elbow  Skin:    General: Skin is warm and dry.     Findings: Bruising (Left wrist and hand) present.  Neurological:     Mental Status: She is alert.     Comments: Difficult rising from a chair independently-needs assistance.  Gait is slow, small steps  Psychiatric:        Mood and Affect: Mood normal.            Assessment & Plan:    See Problem List for Assessment and Plan of chronic medical problems.

## 2019-03-24 NOTE — Telephone Encounter (Signed)
Appointment set up for 1 this afternoon.

## 2019-03-24 NOTE — Telephone Encounter (Signed)
Call patient - she has a couple of fractures in her wrist.  It does not look like she needs surgery.  I would like her to see our sports med doctor bc he can see her tomorrow at 2:30.   He will look at the wrist and probably brace it to help with her pain.

## 2019-03-24 NOTE — Progress Notes (Signed)
Virtual Visit via Video Note  I connected with Toni [name] on 03/24/19 at  1:00 PM EDT by a video enabled telemedicine application and verified that I am speaking with the correct person using two identifiers.   I discussed the limitations of evaluation and management by telemedicine and the availability of in person appointments. The patient expressed understanding and agreed to proceed.  The patient is currently at home and I am in the office.  Her husband is with her, who provides most of the history due to her dementia.   No referring provider.    History of Present Illness: This is an acute visit for wrist pain.       Social History   Socioeconomic History  . Marital status: Married    Spouse name: Not on file  . Number of children: 2  . Years of education: Not on file  . Highest education level: Not on file  Occupational History  . Not on file  Social Needs  . Financial resource strain: Not on file  . Food insecurity:    Worry: Not on file    Inability: Not on file  . Transportation needs:    Medical: Not on file    Non-medical: Not on file  Tobacco Use  . Smoking status: Former Smoker    Last attempt to quit: 12/10/1973    Years since quitting: 45.3  . Smokeless tobacco: Never Used  Substance and Sexual Activity  . Alcohol use: Yes    Alcohol/week: 0.0 standard drinks    Comment: 2 glasses of wine daily   . Drug use: No  . Sexual activity: Not on file  Lifestyle  . Physical activity:    Days per week: Not on file    Minutes per session: Not on file  . Stress: Not on file  Relationships  . Social connections:    Talks on phone: Not on file    Gets together: Not on file    Attends religious service: Not on file    Active member of club or organization: Not on file    Attends meetings of clubs or organizations: Not on file    Relationship status: Not on file  Other Topics Concern  . Not on file  Social History Narrative  . Not on file      Observations/Objective: Appears well in NAD   Assessment and Plan:  See Problem List for Assessment and Plan of chronic medical problems.   Follow Up Instructions:    I discussed the assessment and treatment plan with the patient. The patient was provided an opportunity to ask questions and all were answered. The patient agreed with the plan and demonstrated an understanding of the instructions.   The patient was advised to call back or seek an in-person evaluation if the symptoms worsen or if the condition fails to improve as anticipated.    Binnie Rail, MD  This encounter was created in error - please disregard.

## 2019-03-24 NOTE — Assessment & Plan Note (Signed)
She fell yesterday at home and landed on an outstretched left arm and now has wrist pain Concern for probable fracture X-ray today She will continue Tylenol-she last took Tylenol last night.  Advised her husband to give it to her regularly to keep pain well controlled.  Maximum of 3000 mg a day If her pain is not controlled we can consider something stronger, but with her dementia ideally we should avoid this Depending on results of x-ray may need to seen Ortho/sports medicine

## 2019-03-24 NOTE — Telephone Encounter (Signed)
How would you like this issue handled?

## 2019-03-25 ENCOUNTER — Encounter: Payer: Self-pay | Admitting: Family Medicine

## 2019-03-25 ENCOUNTER — Ambulatory Visit (INDEPENDENT_AMBULATORY_CARE_PROVIDER_SITE_OTHER): Payer: Medicare Other | Admitting: Family Medicine

## 2019-03-25 VITALS — BP 150/80 | HR 72 | Ht 64.5 in | Wt 208.0 lb

## 2019-03-25 DIAGNOSIS — S52502A Unspecified fracture of the lower end of left radius, initial encounter for closed fracture: Secondary | ICD-10-CM | POA: Diagnosis not present

## 2019-03-25 DIAGNOSIS — S52602A Unspecified fracture of lower end of left ulna, initial encounter for closed fracture: Secondary | ICD-10-CM

## 2019-03-25 DIAGNOSIS — M25532 Pain in left wrist: Secondary | ICD-10-CM

## 2019-03-25 NOTE — Progress Notes (Signed)
[name] [place], [place] [address] Phone: [phone] Subjective:   I Toni [name] am serving as a Education administrator for Toni [name].     CC: Left wrist pain  [id number]  Toni [name] is a 86 y.o. female coming in with complaint of left wrist pain. States that she FOOSHed. Wrist is swollen. Loss of ROM.  Patient is accompanied with her caregiver who gave more of the history.  Patient does have a history of Alzheimer's.  Onset- Monday Location- Wrist, radial  Duration-  Character-aching sensation with sharp pain with certain range of motion Aggravating factors- making a fist, any ROM Reliving factors-  Therapies tried- Tylenol Severity-9 out of 10.    Patient yesterday did have x-rays done of the left wrist.  Independently visualized by me showing a comminuted nondisplaced distal radius fracture.  Patient also has an ulnar styloid fracture as well.  Past Medical History:  Diagnosis Date  . Acute kidney injury ([place])   . C. difficile colitis 2016  . Colonic polyp   . Depression   . Diverticulosis   . GERD (gastroesophageal reflux disease)   . Headache    sinus  . Helicobacter pylori gastritis 1987  . Hepatic cyst   . Hyperlipemia   . Hypertension   . Pneumonia    hx of x 2   . PUD (peptic ulcer disease) 1987   with h pylori   Past Surgical History:  Procedure Laterality Date  . ABDOMINAL HYSTERECTOMY     BSO ; endometriosis; ? Appendectomy incidentally  . CHOLECYSTECTOMY  2008  . COLONOSCOPY  2003 & 2012   polyps; Dr Collene Mares  . CYSTOSCOPY  10/19/2016   Procedure: CYSTOSCOPY;  Surgeon: Nickie Retort, MD;  [facility];  Service: Urology;;  . endoscopy gastritis  2008  . FLEXIBLE SIGMOIDOSCOPY N/A 05/31/2016   Procedure: FLEXIBLE SIGMOIDOSCOPY;  Surgeon: Juanita Craver, MD;  [facility];  Service: Endoscopy;  Laterality: N/A;  . LAPAROSCOPIC PARTIAL COLECTOMY N/A 10/19/2016   Procedure: LAPAROSCOPIC  ASSISTED SIGMIOD COLOECTOMY MOBILIZATION OF SPLEENIC [place];  Surgeon: Jackolyn Confer, MD;  [facility];  Service: General;  Laterality: N/A;  . ROTATOR CUFF REPAIR     right  . TONSILLECTOMY AND ADENOIDECTOMY     Social History   Socioeconomic History  . Marital status: Married    Spouse name: Not on file  . Number of children: 2  . Years of education: Not on file  . Highest education level: Not on file  Occupational History  . Not on file  Social Needs  . Financial resource strain: Not on file  . Food insecurity:    Worry: Not on file    Inability: Not on file  . Transportation needs:    Medical: Not on file    Non-medical: Not on file  Tobacco Use  . Smoking status: Former Smoker    Last attempt to quit: 12/10/1973    Years since quitting: 45.3  . Smokeless tobacco: Never Used  Substance and Sexual Activity  . Alcohol use: Yes    Alcohol/week: 0.0 standard drinks    Comment: 2 glasses of wine daily   . Drug use: No  . Sexual activity: Not on file  Lifestyle  . Physical activity:    Days per week: Not on file    Minutes per session: Not on file  . Stress: Not on file  Relationships  . Social connections:  Talks on phone: Not on file    Gets together: Not on file    Attends religious service: Not on file    Active member of club or organization: Not on file    Attends meetings of clubs or organizations: Not on file    Relationship status: Not on file  Other Topics Concern  . Not on file  Social History Narrative  . Not on file   Allergies  Allergen Reactions  . Amlodipine Other (See Comments)    edema   Family History  Problem Relation Age of Onset  . Asthma Brother   . Hypertension Father   . Heart attack Father 42  . Leukemia Mother   . Stomach cancer Maternal Aunt   . Breast cancer Sister   . Thyroid disease Sister   . Stroke Neg Hx   . Diabetes Neg Hx      Current Outpatient Medications (Cardiovascular):  .  atorvastatin  (LIPITOR) 20 MG tablet, Take 1 tablet (20 mg total) by mouth at bedtime. .  furosemide (LASIX) 20 MG tablet, TAKE 1 TABLET BY MOUTH  DAILY .  isosorbide mononitrate (IMDUR) 60 MG 24 hr tablet, TAKE 1 TABLET BY MOUTH  DAILY .  metoprolol tartrate (LOPRESSOR) 50 MG tablet, Take 1 tablet (50 mg total) by mouth 2 (two) times daily.   Current Outpatient Medications (Analgesics):  .  aspirin 81 MG tablet, Take 81 mg by mouth daily.  Current Outpatient Medications (Hematological):  .  cyanocobalamin 500 MCG tablet, Take 500 mcg by mouth daily.  Current Outpatient Medications (Other):  .  Benzethonium Chloride (ANTI-BACTERIAL CLEANSING WIPES) 0.18 % MISC, Apply Topically. Marland Kitchen  buPROPion (WELLBUTRIN XL) 150 MG 24 hr tablet, Take 1 tablet twice a day .  donepezil (ARICEPT) 10 MG tablet, Take 1 tablet (10 mg total) by mouth at bedtime. .  memantine (NAMENDA) 10 MG tablet, Take 1 tablet twice a day .  mirtazapine (REMERON) 30 MG tablet, TAKE 1 TABLET BY MOUTH AT  BEDTIME .  nystatin (MYCOSTATIN/NYSTOP) powder, Apply topically 4 (four) times daily. Marland Kitchen  nystatin cream (MYCOSTATIN), Apply 1 application topically 2 (two) times daily. [place] Rack Supplies (CLOSED-END COLOSTOMY POUCH) MISC, Use as directed .  Ostomy Supplies (ODOR SPRAY) LIQD, uad    Past medical history, social, surgical and family history all reviewed in electronic medical record.  No pertanent information unless stated regarding to the chief complaint.   Review of Systems:  No headache, visual changes, nausea, vomiting, diarrhea, constipation, dizziness, abdominal pain, skin rash, fevers, chills, night sweats, weight loss, swollen lymph nodes, body achess, chest pain, shortness of breath, mood changes.  Positive muscle aches and joint swelling  Objective  Blood pressure (!) 150/80, pulse 72, height 5' 4.5" (1.638 m), weight 208 lb (94.3 kg), SpO2 96 %.    General: No apparent distress alert but not oriented HEENT: Pupils equal,  extraocular movements intact  Respiratory: Patient's speak in full sentences and does not appear short of breath  Cardiovascular: 1+ lower extremity edema, non tender, no erythema  Skin: Warm dry intact with no signs of infection or rash on extremities or on axial skeleton.  Abdomen: Soft nontender colostomy bag Neuro: Cranial nerves II through XII are intact, neurovascularly intact in all extremities with 2+ DTRs and 2+ pulses.  Lymph: No lymphadenopathy of posterior or anterior cervical chain or axillae bilaterally.  Gait patient does walk with a wide-based gait MSK: Arthritic changes of multiple joints Left hand exam shows the  patient does have bruising and some dorsal swelling of the wrist.  Severely tender to palpation over the ulnar as well as the distal radius.  Patient otherwise neurovascularly intact distally and does have good capillary refill   Impression and Recommendations:     This case required medical decision making of moderate complexity. The above documentation has been reviewed and is accurate and complete Lyndal Pulley, DO       Note: This dictation was prepared with Dragon dictation along with smaller phrase technology. Any transcriptional errors that result from this process are unintentional.

## 2019-03-25 NOTE — Patient Instructions (Addendum)
Good to see you  Wear the brace day and night for 3 weeks Can get it wet but avoid it if you can.  Tylenol 325mg  3 times a day for pain.  See you again in 3 weeks. Come 15 minutes early and get xray downstairs

## 2019-03-25 NOTE — Assessment & Plan Note (Addendum)
Distal radius fracture.  My concern for the comminuted aspect of it.  Patient though with all comorbidities is not a surgical candidate.  Patient will be a code and a cast today.  Will wear for 3 weeks.  Repeat x-rays at that time.  Tylenol for pain.  Worsening pain to call us.  Encourage her to avoid repetitive activity too much.  Patient does have dementia.  Discussed with patient's caregiver in great length.  We discussed avoiding making him too tired in any point.  He notes any discoloration of the fingers he needs to discontinue the brace.  Can make adjustments as necessary.

## 2019-03-30 ENCOUNTER — Other Ambulatory Visit: Payer: Self-pay | Admitting: Internal Medicine

## 2019-04-02 ENCOUNTER — Telehealth: Payer: Self-pay | Admitting: Internal Medicine

## 2019-04-02 NOTE — Telephone Encounter (Signed)
I spoke to Mr. Mings and advised him of below. He states patient has been taking Furosemide qd and HCTZ qd. He states her BP usually reads around 130s/80s and she has no leg edema currently. Please advise.

## 2019-04-02 NOTE — Telephone Encounter (Signed)
I believe she is taking Lasix or furosemide 20 mg daily.  If she is taking this she cannot be taking hydrochlorothiazide-both are water pills.  Please clarify what she is taking.  Is he concerned that he needs something for her blood pressure or leg swelling?

## 2019-04-02 NOTE — Telephone Encounter (Signed)
Patients husband called leaving a voicemail and was very upset about the HCTZ prescription not being filled he stated he needed someone to call him now.

## 2019-04-03 ENCOUNTER — Encounter: Payer: Self-pay | Admitting: Internal Medicine

## 2019-04-03 ENCOUNTER — Telehealth: Payer: Self-pay | Admitting: Internal Medicine

## 2019-04-03 NOTE — Telephone Encounter (Signed)
Noted  

## 2019-04-03 NOTE — Telephone Encounter (Signed)
She should not be on both - stop the hctz and monitor BP -- if it goes up we will add a different medication

## 2019-04-03 NOTE — Telephone Encounter (Addendum)
Call her husband.  We stopped the losartan a while ago due to low BP but at that time continued the hctz.    When she was here for leg swelling we decided the hctz was not strong enough and we changed to lasix and at that time stopped the hctz.  They are both water pills.     Confirm exactly what she is taking.    If her BP is low stop the hctz, continue the lasix and monitor her BP and leg swelling.     As far as the home analysis for her falling - does he mean PT?  I can order it  - most likely they can do that.

## 2019-04-03 NOTE — Telephone Encounter (Signed)
Husband is aware of response below.

## 2019-04-03 NOTE — Telephone Encounter (Signed)
Spoke with husband. She has been taking lasix. He stopped her HCTZ.

## 2019-04-09 ENCOUNTER — Encounter: Payer: Self-pay | Admitting: Family Medicine

## 2019-04-14 ENCOUNTER — Ambulatory Visit (INDEPENDENT_AMBULATORY_CARE_PROVIDER_SITE_OTHER): Payer: Medicare Other | Admitting: Family Medicine

## 2019-04-14 ENCOUNTER — Ambulatory Visit (INDEPENDENT_AMBULATORY_CARE_PROVIDER_SITE_OTHER)
Admission: RE | Admit: 2019-04-14 | Discharge: 2019-04-14 | Disposition: A | Discharge status: Home / Self Care | Payer: Medicare Other | Source: Ambulatory Visit | Attending: Family Medicine | Admitting: Family Medicine

## 2019-04-14 ENCOUNTER — Encounter: Payer: Self-pay | Admitting: Family Medicine

## 2019-04-14 ENCOUNTER — Ambulatory Visit: Payer: Self-pay

## 2019-04-14 ENCOUNTER — Other Ambulatory Visit: Payer: Self-pay

## 2019-04-14 VITALS — BP 142/66 | HR 76 | Ht 64.5 in | Wt 210.0 lb

## 2019-04-14 DIAGNOSIS — S52602G Unspecified fracture of lower end of left ulna, subsequent encounter for closed fracture with delayed healing: Secondary | ICD-10-CM | POA: Diagnosis not present

## 2019-04-14 DIAGNOSIS — M25532 Pain in left wrist: Secondary | ICD-10-CM

## 2019-04-14 DIAGNOSIS — S52502G Unspecified fracture of the lower end of left radius, subsequent encounter for closed fracture with delayed healing: Secondary | ICD-10-CM

## 2019-04-14 DIAGNOSIS — S52602A Unspecified fracture of lower end of left ulna, initial encounter for closed fracture: Secondary | ICD-10-CM

## 2019-04-14 DIAGNOSIS — S52612A Displaced fracture of left ulna styloid process, initial encounter for closed fracture: Secondary | ICD-10-CM | POA: Diagnosis not present

## 2019-04-14 DIAGNOSIS — S52502A Unspecified fracture of the lower end of left radius, initial encounter for closed fracture: Secondary | ICD-10-CM | POA: Diagnosis not present

## 2019-04-14 NOTE — Patient Instructions (Signed)
Good to see you  I am sorry for bad news We will get you in with D.r Tamera Punt just to make sure See me again in 3 weeks unless Dr. Tamera Punt wants to do anything different

## 2019-04-14 NOTE — Progress Notes (Signed)
[facility] [place], [place] [address] Phone: [phone] Subjective:   Toni [name], am serving as a scribe for Dr. Hulan Saas.  I'm seeing this patient by the request  of:    CC: Left wrist pain follow-up  JAS:[id number]   03/25/2019: Distal radius fracture.  My concern for the comminuted aspect of it.  Patient though with all comorbidities is not a surgical candidate.  Patient will be a code and a cast today.  Will wear for 3 weeks.  Repeat x-rays at that time.  Tylenol for pain.  Worsening pain to call us.  Encourage her to avoid repetitive activity too much.  Patient does have dementia.  Discussed with patient's caregiver in great length.  We discussed avoiding making him too tired in any point.  He notes any discoloration of the fingers he needs to discontinue the brace.  Can make adjustments as necessary.  Update 04/14/2019: Toni [name] is a 85 y.o. female coming in with complaint of left wrist pain. Patient states that she has not kept the EXOS brace on. Tender to palpation but not with range of motion.  Patient's caregiver is helping with patient having significant Alzheimer's.  Patient did not wear the brace.  Figured out how to take it off and would not allow him to put it back on.  Continues to have pain  Patient was sent for x-rays patient comminuted fracture of the distal radius is still there but unfortunately not in anatomic alignment.  Mild displacement now noted ulnar aspect with mild angulation.  Significant osteoarthritic changes noted again.     Past Medical History:  Diagnosis Date  . Acute kidney injury ([place])   . C. difficile colitis 2016  . Colonic polyp   . Depression   . Diverticulosis   . GERD (gastroesophageal reflux disease)   . Headache    sinus  . Helicobacter pylori gastritis 1987  . Hepatic cyst   . Hyperlipemia   . Hypertension   . Pneumonia    hx of x 2   . PUD (peptic ulcer disease) 1987   with h pylori   Past Surgical History:  Procedure Laterality Date  . ABDOMINAL HYSTERECTOMY     BSO ; endometriosis; ? Appendectomy incidentally  . CHOLECYSTECTOMY  2008  . COLONOSCOPY  2003 & 2012   polyps; Dr Collene Mares  . CYSTOSCOPY  10/19/2016   Procedure: CYSTOSCOPY;  Surgeon: Nickie Retort, MD;  Location: [facility];  Service: Urology;;  . endoscopy gastritis  2008  . FLEXIBLE SIGMOIDOSCOPY N/A 05/31/2016   Procedure: FLEXIBLE SIGMOIDOSCOPY;  Surgeon: Juanita Craver, MD;  Location: [facility];  Service: Endoscopy;  Laterality: N/A;  . LAPAROSCOPIC PARTIAL COLECTOMY N/A 10/19/2016   Procedure: LAPAROSCOPIC ASSISTED SIGMIOD COLOECTOMY MOBILIZATION OF SPLEENIC [place];  Surgeon: Jackolyn Confer, MD;  Location: [facility];  Service: General;  Laterality: N/A;  . ROTATOR CUFF REPAIR     right  . TONSILLECTOMY AND ADENOIDECTOMY     Social History   Socioeconomic History  . Marital status: Married    Spouse name: Not on file  . Number of children: 2  . Years of education: Not on file  . Highest education level: Not on file  Occupational History  . Not on file  Social Needs  . Financial resource strain: Not on file  . Food insecurity:    Worry: Not on file    Inability: Not on file  . Transportation needs:  Medical: Not on file    Non-medical: Not on file  Tobacco Use  . Smoking status: Former Smoker    Last attempt to quit: 12/10/1973    Years since quitting: 45.3  . Smokeless tobacco: Never Used  Substance and Sexual Activity  . Alcohol use: Yes    Alcohol/week: 0.0 standard drinks    Comment: 2 glasses of wine daily   . Drug use: [name]  . Sexual activity: Not on file  Lifestyle  . Physical activity:    Days per week: Not on file    Minutes per session: Not on file  . Stress: Not on file  Relationships  . Social connections:    Talks on phone: Not on file    Gets together: Not on file    Attends religious service: Not on file    Active member of club or  organization: Not on file    Attends meetings of clubs or organizations: Not on file    Relationship status: Not on file  Other Topics Concern  . Not on file  Social History Narrative  . Not on file   Allergies  Allergen Reactions  . Amlodipine Other (See Comments)    edema   Family History  Problem Relation Age of Onset  . Asthma Brother   . Hypertension Father   . Heart attack Father 31  . Leukemia Mother   . Stomach cancer Maternal Aunt   . Breast cancer Sister   . Thyroid disease Sister   . Stroke Neg Hx   . Diabetes Neg Hx      Current Outpatient Medications (Cardiovascular):  .  atorvastatin (LIPITOR) 20 MG tablet, Take 1 tablet (20 mg total) by mouth at bedtime. .  furosemide (LASIX) 20 MG tablet, TAKE 1 TABLET BY MOUTH  DAILY .  isosorbide mononitrate (IMDUR) 60 MG 24 hr tablet, TAKE 1 TABLET BY MOUTH  DAILY .  metoprolol tartrate (LOPRESSOR) 50 MG tablet, Take 1 tablet (50 mg total) by mouth 2 (two) times daily.   Current Outpatient Medications (Analgesics):  .  aspirin 81 MG tablet, Take 81 mg by mouth daily.  Current Outpatient Medications (Hematological):  .  cyanocobalamin 500 MCG tablet, Take 500 mcg by mouth daily.  Current Outpatient Medications (Other):  .  Benzethonium Chloride (ANTI-BACTERIAL CLEANSING WIPES) 0.18 % MISC, Apply Topically. Marland Kitchen  buPROPion (WELLBUTRIN XL) 150 MG 24 hr tablet, Take 1 tablet twice a day .  donepezil (ARICEPT) 10 MG tablet, Take 1 tablet (10 mg total) by mouth at bedtime. .  memantine (NAMENDA) 10 MG tablet, Take 1 tablet twice a day .  mirtazapine (REMERON) 30 MG tablet, TAKE 1 TABLET BY MOUTH AT  BEDTIME .  nystatin (MYCOSTATIN/NYSTOP) powder, Apply topically 4 (four) times daily. Marland Kitchen  nystatin cream (MYCOSTATIN), Apply 1 application topically 2 (two) times daily. Oneta Rack Supplies (CLOSED-END COLOSTOMY POUCH) MISC, Use as directed .  Ostomy Supplies (ODOR SPRAY) LIQD, uad    Past medical history, social, surgical and  family history all reviewed in electronic medical record.  [name] pertanent information unless stated regarding to the chief complaint.   Review of Systems:  [name] headache, visual changes, nausea, vomiting, diarrhea, constipation, dizziness, abdominal pain, skin rash, fevers, chills, night sweats, weight loss, swollen lymph nodes, body aches, joint swelling, , chest pain, shortness of breath, mood changes.  Positive muscle aches  Objective  Blood pressure (!) 142/66, pulse 76, height 5' 4.5" (1.638 m), weight 210 lb (95.3 kg), SpO2  98 %.    General: [name] apparent distress patient is alert but not oriented HEENT: Pupils equal, extraocular movements intact  Respiratory: Patient's speak in full sentences and does not appear short of breath  Cardiovascular: Trace lower extremity edema, non tender, [name] erythema  Skin: Warm dry intact with [name] signs of infection or rash on extremities or on axial skeleton.  Abdomen: Soft nontender  Neuro: Cranial nerves II through XII are intact, neurovascularly intact in all extremities with 2+ DTRs and 2+ pulses.  Lymph: [name] lymphadenopathy of posterior or anterior cervical chain or axillae bilaterally.  Gait antalgic MSK:  Non tender with full range of motion and good stability and symmetric strength and tone of shoulders, elbows, , hip, knee and ankles bilaterally.  Left wrist shows the patient does have some bruising noted.  Some mild increase in swelling from previous exam.  Patient is severely tender to palpation.  Neurovascular intact distally.  Did not try range of motion testing secondary to pain   Limited musculoskeletal ultrasound performed and interpreted by Lyndal Pulley  Limited ultrasound of patient's relations cortical defect noted with some mild angulation dorsally of the distal radius.  Stopped exam secondary to patient's pain.   Impression and Recommendations:    . The above documentation has been reviewed and is accurate and complete Lyndal Pulley, DO       Note: This dictation was prepared with Dragon dictation along with smaller phrase technology. Any transcriptional errors that result from this process are unintentional.

## 2019-04-14 NOTE — Assessment & Plan Note (Signed)
Patient does have a distal radius fracture with ulnar styloid ulnar fracture.  Patient now does have minimal displacement as well as angulation.  Patient has been noncompliant with the exos brace.  Caregiver is having a difficult time keeping it in place.  Patient is unlikely a surgical candidate but I would like him to be further evaluated by orthopedic surgery.  In addition to this I think that casting for this individual would be better and unfortunately we are out of cast material today patient as well as caregiver can call with any other significant questions.

## 2019-04-18 ENCOUNTER — Other Ambulatory Visit: Payer: Self-pay | Admitting: Internal Medicine

## 2019-04-21 DIAGNOSIS — M65332 Trigger finger, left middle finger: Secondary | ICD-10-CM | POA: Diagnosis not present

## 2019-04-21 DIAGNOSIS — S52572A Other intraarticular fracture of lower end of left radius, initial encounter for closed fracture: Secondary | ICD-10-CM | POA: Diagnosis not present

## 2019-04-28 ENCOUNTER — Telehealth: Payer: Self-pay | Admitting: *Deleted

## 2019-04-28 DIAGNOSIS — S62109D Fracture of unspecified carpal bone, unspecified wrist, subsequent encounter for fracture with routine healing: Secondary | ICD-10-CM

## 2019-04-28 DIAGNOSIS — R2689 Other abnormalities of gait and mobility: Secondary | ICD-10-CM

## 2019-04-28 DIAGNOSIS — R5381 Other malaise: Secondary | ICD-10-CM

## 2019-04-28 DIAGNOSIS — R296 Repeated falls: Secondary | ICD-10-CM

## 2019-04-28 NOTE — Telephone Encounter (Signed)
During AWV, Mr. Mounsey explained that he is very concerned about patient's ambulatory status. Husband states that after patient fell and fractured her wrist on 03/23/19 she has steadily become less able to ambulate. At this time patient needs complete assistance to get up from chair or bed to walk to any part of the house. Mr. Gudiel feels that it is a combination of patient's fear of falling again and that she is becoming weaker each day that she is not ambulating. Patient requests referrals for PT and as soon as this can be ordered due to covid-19 safety measures, to help patient increase her strength and maintain ADL skills. Explaining the [facility] PT would be easier but he is willing to do outpatient if needed.

## 2019-04-30 NOTE — Telephone Encounter (Signed)
Called Toni [name] to inform him that PT has been ordered and they should contact him soon to schedule. He was appreciative for the call-back.

## 2019-04-30 NOTE — Telephone Encounter (Signed)
Ordered - they will contact him

## 2019-05-05 ENCOUNTER — Ambulatory Visit: Payer: Medicare Other | Admitting: Family Medicine

## 2019-05-09 ENCOUNTER — Other Ambulatory Visit: Payer: Self-pay | Admitting: Internal Medicine

## 2019-05-09 DIAGNOSIS — E785 Hyperlipidemia, unspecified: Secondary | ICD-10-CM | POA: Diagnosis not present

## 2019-05-09 DIAGNOSIS — Z9181 History of falling: Secondary | ICD-10-CM | POA: Diagnosis not present

## 2019-05-09 DIAGNOSIS — S52502D Unspecified fracture of the lower end of left radius, subsequent encounter for closed fracture with routine healing: Secondary | ICD-10-CM | POA: Diagnosis not present

## 2019-05-09 DIAGNOSIS — Z7982 Long term (current) use of aspirin: Secondary | ICD-10-CM | POA: Diagnosis not present

## 2019-05-09 DIAGNOSIS — Z933 Colostomy status: Secondary | ICD-10-CM | POA: Diagnosis not present

## 2019-05-09 DIAGNOSIS — F329 Major depressive disorder, single episode, unspecified: Secondary | ICD-10-CM | POA: Diagnosis not present

## 2019-05-09 DIAGNOSIS — S52602D Unspecified fracture of lower end of left ulna, subsequent encounter for closed fracture with routine healing: Secondary | ICD-10-CM | POA: Diagnosis not present

## 2019-05-09 DIAGNOSIS — F0391 Unspecified dementia with behavioral disturbance: Secondary | ICD-10-CM | POA: Diagnosis not present

## 2019-05-09 DIAGNOSIS — F419 Anxiety disorder, unspecified: Secondary | ICD-10-CM | POA: Diagnosis not present

## 2019-05-09 DIAGNOSIS — I5030 Unspecified diastolic (congestive) heart failure: Secondary | ICD-10-CM | POA: Diagnosis not present

## 2019-05-09 DIAGNOSIS — I130 Hypertensive heart and chronic kidney disease with heart failure and stage 1 through stage 4 chronic kidney disease, or unspecified chronic kidney disease: Secondary | ICD-10-CM | POA: Diagnosis not present

## 2019-05-09 DIAGNOSIS — G43909 Migraine, unspecified, not intractable, without status migrainosus: Secondary | ICD-10-CM | POA: Diagnosis not present

## 2019-05-09 DIAGNOSIS — N189 Chronic kidney disease, unspecified: Secondary | ICD-10-CM | POA: Diagnosis not present

## 2019-05-09 DIAGNOSIS — R7303 Prediabetes: Secondary | ICD-10-CM | POA: Diagnosis not present

## 2019-05-09 DIAGNOSIS — I252 Old myocardial infarction: Secondary | ICD-10-CM | POA: Diagnosis not present

## 2019-05-09 DIAGNOSIS — Z87891 Personal history of nicotine dependence: Secondary | ICD-10-CM | POA: Diagnosis not present

## 2019-05-11 ENCOUNTER — Telehealth: Payer: Self-pay | Admitting: Internal Medicine

## 2019-05-11 NOTE — Telephone Encounter (Signed)
Gave ok for verbal orders per Dr. Burns.  

## 2019-05-11 NOTE — Telephone Encounter (Signed)
Copied from [place] [id number]. Topic: Quick Communication - Home Health Verbal Orders >> May 11, 2019  3:31 PM Luciana Axe wrote: Caller/Agency: [facility] Number: [phone] ok to leave verbal on voice mail Requesting OT/PT/Skilled Nursing/Social Work/Speech Therapy: PT Frequency: 1 x a week for 1 week, 2x a week for 3 weeks, 1 time a week for 3 weeks.

## 2019-05-14 DIAGNOSIS — F419 Anxiety disorder, unspecified: Secondary | ICD-10-CM

## 2019-05-14 DIAGNOSIS — Z933 Colostomy status: Secondary | ICD-10-CM

## 2019-05-14 DIAGNOSIS — F0391 Unspecified dementia with behavioral disturbance: Secondary | ICD-10-CM

## 2019-05-14 DIAGNOSIS — F329 Major depressive disorder, single episode, unspecified: Secondary | ICD-10-CM | POA: Diagnosis not present

## 2019-05-14 DIAGNOSIS — G43909 Migraine, unspecified, not intractable, without status migrainosus: Secondary | ICD-10-CM | POA: Diagnosis not present

## 2019-05-14 DIAGNOSIS — E785 Hyperlipidemia, unspecified: Secondary | ICD-10-CM | POA: Diagnosis not present

## 2019-05-14 DIAGNOSIS — N189 Chronic kidney disease, unspecified: Secondary | ICD-10-CM

## 2019-05-14 DIAGNOSIS — R7303 Prediabetes: Secondary | ICD-10-CM | POA: Diagnosis not present

## 2019-05-14 DIAGNOSIS — I252 Old myocardial infarction: Secondary | ICD-10-CM | POA: Diagnosis not present

## 2019-05-14 DIAGNOSIS — Z9181 History of falling: Secondary | ICD-10-CM

## 2019-05-14 DIAGNOSIS — I130 Hypertensive heart and chronic kidney disease with heart failure and stage 1 through stage 4 chronic kidney disease, or unspecified chronic kidney disease: Secondary | ICD-10-CM | POA: Diagnosis not present

## 2019-05-14 DIAGNOSIS — S52502D Unspecified fracture of the lower end of left radius, subsequent encounter for closed fracture with routine healing: Secondary | ICD-10-CM | POA: Diagnosis not present

## 2019-05-14 DIAGNOSIS — I5030 Unspecified diastolic (congestive) heart failure: Secondary | ICD-10-CM | POA: Diagnosis not present

## 2019-05-14 DIAGNOSIS — S52602D Unspecified fracture of lower end of left ulna, subsequent encounter for closed fracture with routine healing: Secondary | ICD-10-CM | POA: Diagnosis not present

## 2019-05-14 DIAGNOSIS — Z87891 Personal history of nicotine dependence: Secondary | ICD-10-CM

## 2019-05-14 DIAGNOSIS — Z7982 Long term (current) use of aspirin: Secondary | ICD-10-CM

## 2019-05-16 DIAGNOSIS — I130 Hypertensive heart and chronic kidney disease with heart failure and stage 1 through stage 4 chronic kidney disease, or unspecified chronic kidney disease: Secondary | ICD-10-CM | POA: Diagnosis not present

## 2019-05-16 DIAGNOSIS — S52602D Unspecified fracture of lower end of left ulna, subsequent encounter for closed fracture with routine healing: Secondary | ICD-10-CM | POA: Diagnosis not present

## 2019-05-16 DIAGNOSIS — I5030 Unspecified diastolic (congestive) heart failure: Secondary | ICD-10-CM | POA: Diagnosis not present

## 2019-05-16 DIAGNOSIS — R7303 Prediabetes: Secondary | ICD-10-CM | POA: Diagnosis not present

## 2019-05-16 DIAGNOSIS — N189 Chronic kidney disease, unspecified: Secondary | ICD-10-CM | POA: Diagnosis not present

## 2019-05-16 DIAGNOSIS — S52502D Unspecified fracture of the lower end of left radius, subsequent encounter for closed fracture with routine healing: Secondary | ICD-10-CM | POA: Diagnosis not present

## 2019-05-19 DIAGNOSIS — S52502D Unspecified fracture of the lower end of left radius, subsequent encounter for closed fracture with routine healing: Secondary | ICD-10-CM | POA: Diagnosis not present

## 2019-05-19 DIAGNOSIS — M65332 Trigger finger, left middle finger: Secondary | ICD-10-CM | POA: Diagnosis not present

## 2019-05-19 DIAGNOSIS — S52572D Other intraarticular fracture of lower end of left radius, subsequent encounter for closed fracture with routine healing: Secondary | ICD-10-CM | POA: Diagnosis not present

## 2019-05-19 DIAGNOSIS — I130 Hypertensive heart and chronic kidney disease with heart failure and stage 1 through stage 4 chronic kidney disease, or unspecified chronic kidney disease: Secondary | ICD-10-CM | POA: Diagnosis not present

## 2019-05-19 DIAGNOSIS — N189 Chronic kidney disease, unspecified: Secondary | ICD-10-CM | POA: Diagnosis not present

## 2019-05-19 DIAGNOSIS — I5030 Unspecified diastolic (congestive) heart failure: Secondary | ICD-10-CM | POA: Diagnosis not present

## 2019-05-19 DIAGNOSIS — S52602D Unspecified fracture of lower end of left ulna, subsequent encounter for closed fracture with routine healing: Secondary | ICD-10-CM | POA: Diagnosis not present

## 2019-05-19 DIAGNOSIS — R7303 Prediabetes: Secondary | ICD-10-CM | POA: Diagnosis not present

## 2019-05-19 NOTE — Telephone Encounter (Signed)
Closing encounter

## 2019-05-20 ENCOUNTER — Other Ambulatory Visit: Payer: Self-pay

## 2019-05-21 NOTE — Progress Notes (Signed)
Has a CNA and PT that comes to the house

## 2019-05-22 ENCOUNTER — Telehealth (INDEPENDENT_AMBULATORY_CARE_PROVIDER_SITE_OTHER): Payer: Medicare Other | Admitting: Neurology

## 2019-05-22 ENCOUNTER — Other Ambulatory Visit: Payer: Self-pay

## 2019-05-22 DIAGNOSIS — I5030 Unspecified diastolic (congestive) heart failure: Secondary | ICD-10-CM | POA: Diagnosis not present

## 2019-05-22 DIAGNOSIS — F03B18 Unspecified dementia, moderate, with other behavioral disturbance: Secondary | ICD-10-CM

## 2019-05-22 DIAGNOSIS — N189 Chronic kidney disease, unspecified: Secondary | ICD-10-CM | POA: Diagnosis not present

## 2019-05-22 DIAGNOSIS — F0391 Unspecified dementia with behavioral disturbance: Secondary | ICD-10-CM | POA: Diagnosis not present

## 2019-05-22 DIAGNOSIS — S52502D Unspecified fracture of the lower end of left radius, subsequent encounter for closed fracture with routine healing: Secondary | ICD-10-CM | POA: Diagnosis not present

## 2019-05-22 DIAGNOSIS — S52602D Unspecified fracture of lower end of left ulna, subsequent encounter for closed fracture with routine healing: Secondary | ICD-10-CM | POA: Diagnosis not present

## 2019-05-22 DIAGNOSIS — I130 Hypertensive heart and chronic kidney disease with heart failure and stage 1 through stage 4 chronic kidney disease, or unspecified chronic kidney disease: Secondary | ICD-10-CM | POA: Diagnosis not present

## 2019-05-22 DIAGNOSIS — R7303 Prediabetes: Secondary | ICD-10-CM | POA: Diagnosis not present

## 2019-05-22 MED ORDER — MEMANTINE HCL 10 MG PO TABS: ORAL_TABLET | ORAL | 3 refills | Status: DC

## 2019-05-22 MED ORDER — DONEPEZIL HCL 10 MG PO TABS: 10.0000 mg | ORAL_TABLET | Freq: Every day | ORAL | 3 refills | Status: DC

## 2019-05-22 MED ORDER — BUPROPION HCL ER (XL) 150 MG PO TB24: ORAL_TABLET | ORAL | 3 refills | Status: DC

## 2019-05-22 NOTE — Progress Notes (Signed)
Virtual Visit via Telephone Note The purpose of this virtual visit is to provide medical care while limiting exposure to the novel coronavirus.    Consent was obtained for phone visit:  Yes.   Answered questions that patient had about telehealth interaction:  Yes.   I discussed the limitations, risks, security and privacy concerns of performing an evaluation and management service by telephone. I also discussed with the patient that there may be a patient responsible charge related to this service. The patient expressed understanding and agreed to proceed.  Pt location: Home Physician Location: office Name of referring provider:  Binnie Rail, MD I connected with .Jolayne Haines at patients initiation/request on 05/22/2019 at 10:30 AM EDT by telephone and verified that I am speaking with the correct person using two identifiers.  Pt MRN:  [id number] Pt DOB:  06-14-1934   History of Present Illness:  The patient was last seen in November 2019 for moderate dementia with behavioral disturbance. Her husband is present during the phone visit to provide additional information. MMSE 16/30 in November 2019 (21/30 in May 2019, 22/30 in April 2018, 24/30 in October 2017, 23/30 in April 2017, 25/30 in October 2016). She is on Donepezil 10mg  daily and Memantine 10mg  BID without side effects. On her last visit, Wellbutrin Xl was increased to 150mg  BID due to continued irritability. She is also on mirtazapine 30mg  qhs. Her husband has not noticed much improvement in behavior with medication increase. She is pleasant during today's visit. No hallucinations or paranoia. Her husband reports continued progression of dementia, he dresses her every morning and has to give her baths. Husband manages finances, medications, and meals. She does not drive. They have a CNA coming 3 days a week from 10AM to 4PM to help with her exercise and colostomy bag. Sleep is good. She states her memory is "pretty good, I'm  really pleased with it." No headaches, dizziness, vision changes. She fell in April and sustained a left wrist fracture. She denies any pain. She has a terrible phobia of falling and has home PT working with her. She plans to eventually use a walker when her wrist permits.   HPI: This is a pleasant 85 yo ambidextrous left-hand dominant woman with a history of hypertension, hyperlipidemia, presenting for evaluation of memory loss with abnormal head CT. When asked about her memory, Ms. Bui reports her short-term memory is "not good at all." Long-term memory is good, she can remember details about her childhood well. She started noticing changes in the past year, but worse in the past 4-5 months. She would forget conversations from 10 minutes ago. She forgot to pay bills in the past 2-3 months. She left the stove on twice around 3-4 months ago. She denies getting lost driving. She denies any problems multitasking, very seldom word-finding difficulties. Her son has noticed that when he calls her, she would repeat the same story at the end of the call, or have the same conversation 10 minutes later. He also noticed changes in the past 1-2 years, worse in the past 3-4 months. Her daughter feels symptoms started a little longer than 2 years ago, she forgot a conversation that someone was in jail in December 2015. She was staying at her daughter's house and forgot what they had talked about on their to-do list. They also have noticed she is very anxious. Her husband reports she had taken something out of the freezer one time, he came out the next day  to see some things were not put back inside. He has noticed that she is more argumentative.   She reports 4 episodes where a shade would come up her right eye. This would last for a few minutes, last episode was in the fall of last year. No associated headache or focal numbness/tingling/weakness. She states she "always has headaches," indicating these are sinus  headaches. She had seen Dr. Jacelyn Grip in 2013 and was diagnosed with cervicogenic headaches. She has headaches around three times a week, with right frontal throbbing pain, relieved with over the counter pain medication. There is no associated nausea/vomiting/photo/phonophobia. She has been diagnosed with migraines where she has a round circle in her right eye occurring around 3-4 times a month. She denies any dizziness, diplopia, blurred vision, neck/back pain, focal numbness/tingling/weakness. She has chronic constipations. She has occasional hand tremors. No anosmia. She denies any family history of memory problems. No history of head injuries. She drinks 1 to 1-1/2 glasses of wine at night.   I personally reviewed head CT without contrast done 03/03/15. There were several hypodensities in the bilateral hemispheres, right greater than left This was read as multifocal posterior circulation infarction with the largest area in the right occiptial pole and smaller area in th e left occipital cortex. Two small areas of cortical and subcortical infarct in the high right parietal lobe, small vessel infarct in the upper right cerebellum. These could be subacute or chronic. Brain atrophy with mild frontal predominance seen.  MRI brain without contrast which did not show any acute changes, there was generalized atrophy with ventricular enlargement, chronic infarcts in the bilateral occipital lobes, right greater than left, small chronic infarct in the right parietal cortex, chronic microvascular disease.    Observations/Objective:  Limited due to nature of phone visit. Patient is awake, alert, pleasant, making jokes. She is oriented to place, states it is Tues, Feb (it is Fri, June). Did not know year. Reduced fluency, could not do serial 7s.  [facility] Cognitive Assessment Blind 05/22/2019 ([place] Blind done over phone) 03/22/2015  Attention: Read list of digits (0/2) 2 1  Attention: Read list of letters (0/1) 0 1    Attention: Serial 7 subtraction starting at 100 (0/3) 0 1  Language: Repeat phrase (0/2) 1 2  Language : Fluency (0/1) 0 1  Abstraction (0/2) 0 2  Delayed Recall (0/5) 0 0  Orientation (0/6) 2 6  Total 5/22 -    Assessment and Plan:   This is a pleasant 85 yo LH woman with vascular risk factors including hypertension, hyperlipidemia, with moderate dementia with behavioral disturbance. [place] [facility] (done over phone) today 5/22 (MMSE 19/30 in November 1019, 21/30 in May 2019, 22/30 in April 2018, 24/30 in April 2017, 25/30 in October 2016). She continues to have progressive decline. Continue Aricept 10mg  daily and Namenda 10mg  BID, continue Wellbutrin XL 150mg  BID and mirtazapine 30mg  qhs for behavioral changes. At this point would not make any changes unless she starts having more significant changes such as hallucinations. We again discussed continued 24/7 supervision, her husband would like to keep her home as long as possible. She does not drive. She will follow-up in 6 months or earlier if needed.   Follow Up Instructions:   -I discussed the assessment and treatment plan with the patient. The patient was provided an opportunity to ask questions and all were answered. The patient agreed with the plan and demonstrated an understanding of the instructions.   The patient was advised to  call back or seek an in-person evaluation if the symptoms worsen or if the condition fails to improve as anticipated.    Total Time spent in visit with the patient was:  25 minutes, of which 100% of the time was spent in counseling and/or coordinating care on the above.   Pt understands and agrees with the plan of care outlined.     Cameron Sprang, MD

## 2019-05-25 DIAGNOSIS — S52502D Unspecified fracture of the lower end of left radius, subsequent encounter for closed fracture with routine healing: Secondary | ICD-10-CM | POA: Diagnosis not present

## 2019-05-25 DIAGNOSIS — I130 Hypertensive heart and chronic kidney disease with heart failure and stage 1 through stage 4 chronic kidney disease, or unspecified chronic kidney disease: Secondary | ICD-10-CM | POA: Diagnosis not present

## 2019-05-25 DIAGNOSIS — I5030 Unspecified diastolic (congestive) heart failure: Secondary | ICD-10-CM | POA: Diagnosis not present

## 2019-05-25 DIAGNOSIS — R7303 Prediabetes: Secondary | ICD-10-CM | POA: Diagnosis not present

## 2019-05-25 DIAGNOSIS — S52602D Unspecified fracture of lower end of left ulna, subsequent encounter for closed fracture with routine healing: Secondary | ICD-10-CM | POA: Diagnosis not present

## 2019-05-25 DIAGNOSIS — N189 Chronic kidney disease, unspecified: Secondary | ICD-10-CM | POA: Diagnosis not present

## 2019-05-29 ENCOUNTER — Ambulatory Visit: Payer: Medicare Other | Admitting: Neurology

## 2019-05-29 DIAGNOSIS — I130 Hypertensive heart and chronic kidney disease with heart failure and stage 1 through stage 4 chronic kidney disease, or unspecified chronic kidney disease: Secondary | ICD-10-CM | POA: Diagnosis not present

## 2019-05-29 DIAGNOSIS — S52502D Unspecified fracture of the lower end of left radius, subsequent encounter for closed fracture with routine healing: Secondary | ICD-10-CM | POA: Diagnosis not present

## 2019-05-29 DIAGNOSIS — S52602D Unspecified fracture of lower end of left ulna, subsequent encounter for closed fracture with routine healing: Secondary | ICD-10-CM | POA: Diagnosis not present

## 2019-05-29 DIAGNOSIS — R7303 Prediabetes: Secondary | ICD-10-CM | POA: Diagnosis not present

## 2019-05-29 DIAGNOSIS — N189 Chronic kidney disease, unspecified: Secondary | ICD-10-CM | POA: Diagnosis not present

## 2019-05-29 DIAGNOSIS — I5030 Unspecified diastolic (congestive) heart failure: Secondary | ICD-10-CM | POA: Diagnosis not present

## 2019-06-01 DIAGNOSIS — S52502D Unspecified fracture of the lower end of left radius, subsequent encounter for closed fracture with routine healing: Secondary | ICD-10-CM | POA: Diagnosis not present

## 2019-06-01 DIAGNOSIS — S52602D Unspecified fracture of lower end of left ulna, subsequent encounter for closed fracture with routine healing: Secondary | ICD-10-CM | POA: Diagnosis not present

## 2019-06-01 DIAGNOSIS — N189 Chronic kidney disease, unspecified: Secondary | ICD-10-CM | POA: Diagnosis not present

## 2019-06-01 DIAGNOSIS — I5030 Unspecified diastolic (congestive) heart failure: Secondary | ICD-10-CM | POA: Diagnosis not present

## 2019-06-01 DIAGNOSIS — R7303 Prediabetes: Secondary | ICD-10-CM | POA: Diagnosis not present

## 2019-06-01 DIAGNOSIS — I130 Hypertensive heart and chronic kidney disease with heart failure and stage 1 through stage 4 chronic kidney disease, or unspecified chronic kidney disease: Secondary | ICD-10-CM | POA: Diagnosis not present

## 2019-06-18 DIAGNOSIS — S52572D Other intraarticular fracture of lower end of left radius, subsequent encounter for closed fracture with routine healing: Secondary | ICD-10-CM | POA: Diagnosis not present

## 2019-06-18 DIAGNOSIS — M65332 Trigger finger, left middle finger: Secondary | ICD-10-CM | POA: Diagnosis not present

## 2019-07-07 ENCOUNTER — Ambulatory Visit: Payer: Medicare Other

## 2019-07-28 DIAGNOSIS — M65332 Trigger finger, left middle finger: Secondary | ICD-10-CM | POA: Diagnosis not present

## 2019-07-28 DIAGNOSIS — S52572D Other intraarticular fracture of lower end of left radius, subsequent encounter for closed fracture with routine healing: Secondary | ICD-10-CM | POA: Diagnosis not present

## 2019-08-02 ENCOUNTER — Other Ambulatory Visit: Payer: Self-pay | Admitting: Internal Medicine

## 2019-08-23 ENCOUNTER — Other Ambulatory Visit: Payer: Self-pay | Admitting: Internal Medicine

## 2019-08-28 ENCOUNTER — Ambulatory Visit: Payer: Medicare Other | Admitting: Internal Medicine

## 2019-08-31 NOTE — Progress Notes (Signed)
Subjective:    Patient ID: Toni [name], female    DOB: 17-Jun-1934, 85 y.o.   MRN: [id number]  HPI The patient is here for follow up.  Her husband is here with her and provides the history due to her dementia.    ? Recent stroke:  Her left leg is dragging nad left arm is "not mobile"  Her husband noticed it about one month ago.  He denies any change in her demeanor.    She picks at her skin and often will start bleeding.  Her husband puts band-aids on them.  He does not know how to stop the picking.  She also pulls out her G-tube at times.   Hypertension, leg edema, CKD: She is taking her medication daily. She is compliant with a low sodium diet.  She denies chest pain, palpitations, shortness of breath.    Hyperlipidemia: She is taking her medication daily. She is compliant with a low fat/cholesterol diet. She denies myalgias.   Prediabetes:  She is compliant with a low sugar/carbohydrate diet.  She is not exercising regularly.  Depression: She is taking her medication daily as prescribed.  Her husband feels her depression is somewhat controlled.   Dementia:  She is taking her medication daily.  She has episodes of being agitated with her husband.  He has a CNA coming three days a week, but thinks he may need someone 5 days a week. Her sleep is good.  She is very sedentary and is not able to get up and walk without assistance.     Medications and allergies reviewed with patient and updated if appropriate.  Patient Active Problem List   Diagnosis Date Noted  . Closed fracture of left distal radius and ulna, initial encounter 03/25/2019  . Left wrist pain 03/24/2019  . Poor balance 03/24/2019  . Urinary incontinence 08/26/2018  . Skin irritation 06/20/2018  . Knee pain, bilateral 04/09/2018  . Prediabetes 12/19/2017  . Edema 01/30/2017  . Colostomy status ([place]) 01/07/2017  . NSTEMI (non-ST elevated myocardial infarction) ([place]) 10/26/2016  . S/P laparoscopic colectomy  10/19/2016  . Colovesical fistula 08/27/2016  . Chronic fatigue 03/30/2016  . Moderate dementia with behavioral disturbance ([place]) 03/22/2016  . Hyperlipidemia 03/22/2016  . Depression 09/21/2015  . History of stroke 09/21/2015  . CKD (chronic kidney disease)   . Lesion of right native kidney 04/13/2015  . Diastolic CHF ([place]) 123456  . History of Clostridium difficile infection 04/05/2015  . Essential hypertension 03/23/2015  . Abnormal CT of brain 03/04/2015  . Cervicogenic headache 01/22/2012  . Diverticulosis of large intestine 10/28/2009  . DIVERTICULITIS, HX OF 10/28/2009  . COLONIC POLYPS, HX OF 03/07/2009  . Anxiety 07/28/2008  . DRY EYE SYNDROME 03/12/2008  . HEPATIC CYST 04/21/2007  . MIGRAINES, HX OF 04/21/2007    Current Outpatient Medications on File Prior to Visit  Medication Sig Dispense Refill  . aspirin 81 MG tablet Take 81 mg by mouth daily.    Marland Kitchen atorvastatin (LIPITOR) 20 MG tablet TAKE 1 TABLET BY MOUTH AT  BEDTIME 90 tablet 1  . Benzethonium Chloride (ANTI-BACTERIAL CLEANSING WIPES) 0.18 % MISC Apply Topically. 25 each 1  . buPROPion (WELLBUTRIN XL) 150 MG 24 hr tablet Take 1 tablet twice a day 180 tablet 3  . cyanocobalamin 500 MCG tablet Take 500 mcg by mouth daily.    Marland Kitchen donepezil (ARICEPT) 10 MG tablet Take 1 tablet (10 mg total) by mouth at bedtime. 90 tablet 3  .  furosemide (LASIX) 20 MG tablet Take 1 tablet by mouth daily. 90 tablet 0  . isosorbide mononitrate (IMDUR) 60 MG 24 hr tablet TAKE 1 TABLET BY MOUTH  DAILY 90 tablet 3  . memantine (NAMENDA) 10 MG tablet Take 1 tablet twice a day 180 tablet 3  . metoprolol tartrate (LOPRESSOR) 50 MG tablet TAKE 1 TABLET BY MOUTH  TWICE DAILY 180 tablet 0  . mirtazapine (REMERON) 30 MG tablet TAKE 1 TABLET BY MOUTH AT  BEDTIME 90 tablet 0  . Ostomy Supplies (CLOSED-END COLOSTOMY POUCH) MISC Use as directed 20 each 0  . Ostomy Supplies (ODOR SPRAY) LIQD uad 118 mL 11   No current facility-administered  medications on file prior to visit.     Past Medical History:  Diagnosis Date  . Acute kidney injury ([place])   . C. difficile colitis 2016  . Colonic polyp   . Depression   . Diverticulosis   . GERD (gastroesophageal reflux disease)   . Headache    sinus  . Helicobacter pylori gastritis 1987  . Hepatic cyst   . Hyperlipemia   . Hypertension   . Pneumonia    hx of x 2   . PUD (peptic ulcer disease) 1987   with h pylori    Past Surgical History:  Procedure Laterality Date  . ABDOMINAL HYSTERECTOMY     BSO ; endometriosis; ? Appendectomy incidentally  . CHOLECYSTECTOMY  2008  . COLONOSCOPY  2003 & 2012   polyps; Dr Collene Mares  . CYSTOSCOPY  10/19/2016   Procedure: CYSTOSCOPY;  Surgeon: Nickie Retort, MD;  Location: [facility];  Service: Urology;;  . endoscopy gastritis  2008  . FLEXIBLE SIGMOIDOSCOPY N/A 05/31/2016   Procedure: FLEXIBLE SIGMOIDOSCOPY;  Surgeon: Juanita Craver, MD;  Location: [facility];  Service: Endoscopy;  Laterality: N/A;  . LAPAROSCOPIC PARTIAL COLECTOMY N/A 10/19/2016   Procedure: LAPAROSCOPIC ASSISTED SIGMIOD COLOECTOMY MOBILIZATION OF SPLEENIC [place];  Surgeon: Jackolyn Confer, MD;  Location: [facility];  Service: General;  Laterality: N/A;  . ROTATOR CUFF REPAIR     right  . TONSILLECTOMY AND ADENOIDECTOMY      Social History   Socioeconomic History  . Marital status: Married    Spouse name: Not on file  . Number of children: 2  . Years of education: Not on file  . Highest education level: Not on file  Occupational History  . Not on file  Social Needs  . Financial resource strain: Not on file  . Food insecurity    Worry: Not on file    Inability: Not on file  . Transportation needs    Medical: Not on file    Non-medical: Not on file  Tobacco Use  . Smoking status: Former Smoker    Quit date: 12/10/1973    Years since quitting: 45.7  . Smokeless tobacco: Never Used  Substance and Sexual Activity  . Alcohol use: Yes    Alcohol/week:  0.0 standard drinks    Comment: 2 glasses of wine daily   . Drug use: No  . Sexual activity: Not on file  Lifestyle  . Physical activity    Days per week: Not on file    Minutes per session: Not on file  . Stress: Not on file  Relationships  . Social Herbalist on phone: Not on file    Gets together: Not on file    Attends religious service: Not on file    Active member of club or organization: Not  on file    Attends meetings of clubs or organizations: Not on file    Relationship status: Not on file  Other Topics Concern  . Not on file  Social History Narrative  . Not on file    Family History  Problem Relation Age of Onset  . Asthma Brother   . Hypertension Father   . Heart attack Father 84  . Leukemia Mother   . Stomach cancer Maternal Aunt   . Breast cancer Sister   . Thyroid disease Sister   . Stroke Neg Hx   . Diabetes Neg Hx     Review of Systems  Constitutional: Positive for appetite change (not eating great).  Respiratory: Negative for cough, shortness of breath and wheezing.   Cardiovascular: Positive for leg swelling. Negative for chest pain and palpitations.  Gastrointestinal: Negative for abdominal pain.  Neurological: Positive for weakness and headaches (improved with tylenol). Negative for numbness.  Psychiatric/Behavioral: Positive for dysphoric mood. Negative for sleep disturbance.       Objective:   Vitals:   09/01/19 1351  BP: 118/64  Pulse: 67  Resp: 16  Temp: 98.1 F (36.7 C)  SpO2: 96%   BP Readings from Last 3 Encounters:  09/01/19 118/64  04/14/19 (!) 142/66  03/25/19 (!) 150/80   Wt Readings from Last 3 Encounters:  04/14/19 210 lb (95.3 kg)  03/25/19 208 lb (94.3 kg)  03/24/19 208 lb (94.3 kg)   Body mass index is 30.13 kg/m.   Physical Exam    Constitutional: Appears well-developed and well-nourished. No distress.  HENT:  Head: Normocephalic and atraumatic.  Neck: Neck supple. No tracheal deviation present.  No thyromegaly present.  No cervical lymphadenopathy Cardiovascular: Normal rate, regular rhythm and normal heart sounds.  No murmur heard. No carotid bruit .  1 + pitting b/l LE edema Pulmonary/Chest: Effort normal and breath sounds normal. No respiratory distress. No has no wheezes. No rales.  Neuro:  Mild left arm weakness 4/5, mild left leg weakness 4/5.  Poor balance Skin: Skin is warm and dry. Not diaphoretic.  Psychiatric: Normal mood and affect. Behavior is normal.      Assessment & Plan:    See Problem List for Assessment and Plan of chronic medical problems.

## 2019-08-31 NOTE — Patient Instructions (Addendum)
  Tests ordered today. Your results will be released to [facility] (or called to you) after review.  If any changes need to be made, you will be notified at that same time.   Flu immunization administered today.     Medications reviewed and updated.  Changes include :   Stop aspirin.  Start plavix.  You can use haldol as needed.    Your prescription(s) have been submitted to your pharmacy. Please take as directed and contact our office if you believe you are having problem(s) with the medication(s).  A Ct scan was ordered to evaluate for a stroke  Please followup in 6 months

## 2019-09-01 ENCOUNTER — Other Ambulatory Visit: Payer: Self-pay

## 2019-09-01 ENCOUNTER — Ambulatory Visit (INDEPENDENT_AMBULATORY_CARE_PROVIDER_SITE_OTHER): Payer: Medicare Other | Admitting: Internal Medicine

## 2019-09-01 ENCOUNTER — Encounter: Payer: Self-pay | Admitting: Internal Medicine

## 2019-09-01 ENCOUNTER — Other Ambulatory Visit (INDEPENDENT_AMBULATORY_CARE_PROVIDER_SITE_OTHER): Payer: Medicare Other

## 2019-09-01 VITALS — BP 118/64 | HR 67 | Temp 98.1°F | Resp 16 | Ht 70.0 in

## 2019-09-01 DIAGNOSIS — E7849 Other hyperlipidemia: Secondary | ICD-10-CM | POA: Diagnosis not present

## 2019-09-01 DIAGNOSIS — R7303 Prediabetes: Secondary | ICD-10-CM

## 2019-09-01 DIAGNOSIS — R531 Weakness: Secondary | ICD-10-CM | POA: Insufficient documentation

## 2019-09-01 DIAGNOSIS — F320 Major depressive disorder, single episode, mild: Secondary | ICD-10-CM | POA: Diagnosis not present

## 2019-09-01 DIAGNOSIS — N189 Chronic kidney disease, unspecified: Secondary | ICD-10-CM | POA: Diagnosis not present

## 2019-09-01 DIAGNOSIS — R600 Localized edema: Secondary | ICD-10-CM

## 2019-09-01 DIAGNOSIS — F0391 Unspecified dementia with behavioral disturbance: Secondary | ICD-10-CM | POA: Diagnosis not present

## 2019-09-01 DIAGNOSIS — Z23 Encounter for immunization: Secondary | ICD-10-CM

## 2019-09-01 DIAGNOSIS — I10 Essential (primary) hypertension: Secondary | ICD-10-CM | POA: Diagnosis not present

## 2019-09-01 DIAGNOSIS — F03B18 Unspecified dementia, moderate, with other behavioral disturbance: Secondary | ICD-10-CM

## 2019-09-01 LAB — COMPREHENSIVE METABOLIC PANEL
ALT: 13 U/L (ref 0–35)
AST: 13 U/L (ref 0–37)
Albumin: 3.8 g/dL (ref 3.5–5.2)
Alkaline Phosphatase: 118 U/L — ABNORMAL HIGH (ref 39–117)
BUN: 12 mg/dL (ref 6–23)
CO2: 29 mEq/L (ref 19–32)
Calcium: 9 mg/dL (ref 8.4–10.5)
Chloride: 102 mEq/L (ref 96–112)
Creatinine, Ser: 1.17 mg/dL (ref 0.40–1.20)
GFR: 44.11 mL/min — ABNORMAL LOW (ref >60.00)
Glucose, Bld: 132 mg/dL — ABNORMAL HIGH (ref 70–99)
Potassium: 3.5 mEq/L (ref 3.5–5.1)
Sodium: 139 mEq/L (ref 135–145)
Total Bilirubin: 0.6 mg/dL (ref 0.2–1.2)
Total Protein: 6.7 g/dL (ref 6.0–8.3)

## 2019-09-01 LAB — CBC WITH DIFFERENTIAL/PLATELET
Basophils Absolute: 0 10*3/uL (ref 0.0–0.1)
Basophils Relative: 0.6 % (ref 0.0–3.0)
Eosinophils Absolute: 0.2 10*3/uL (ref 0.0–0.7)
Eosinophils Relative: 2.7 % (ref 0.0–5.0)
HCT: 40.5 % (ref 36.0–46.0)
Hemoglobin: 13.8 g/dL (ref 12.0–15.0)
Lymphocytes Relative: 20.5 % (ref 12.0–46.0)
Lymphs Abs: 1.7 10*3/uL (ref 0.7–4.0)
MCHC: 34 g/dL (ref 30.0–36.0)
MCV: 88.3 fl (ref 78.0–100.0)
Monocytes Absolute: 0.6 10*3/uL (ref 0.1–1.0)
Monocytes Relative: 7.2 % (ref 3.0–12.0)
Neutro Abs: 5.6 10*3/uL (ref 1.4–7.7)
Neutrophils Relative %: 69 % (ref 43.0–77.0)
Platelets: 308 10*3/uL (ref 150.0–400.0)
RBC: 4.59 Mil/uL (ref 3.87–5.11)
RDW: 14.7 % (ref 11.5–15.5)
WBC: 8.2 10*3/uL (ref 4.0–10.5)

## 2019-09-01 LAB — LIPID PANEL
Cholesterol: 135 mg/dL (ref 0–200)
HDL: 57.6 mg/dL (ref >39.00)
LDL Cholesterol: 53 mg/dL (ref 0–99)
NonHDL: 77.41
Total CHOL/HDL Ratio: 2
Triglycerides: 120 mg/dL (ref 0.0–149.0)
VLDL: 24 mg/dL (ref 0.0–40.0)

## 2019-09-01 LAB — TSH: TSH: 1.96 u[IU]/mL (ref 0.35–4.50)

## 2019-09-01 LAB — HEMOGLOBIN A1C: Hgb A1c MFr Bld: 5.6 % (ref 4.6–6.5)

## 2019-09-01 MED ORDER — HALOPERIDOL 0.5 MG PO TABS: 0.5000 mg | ORAL_TABLET | Freq: Four times a day (QID) | ORAL | 2 refills | Status: DC | PRN

## 2019-09-01 MED ORDER — CLOPIDOGREL BISULFATE 75 MG PO TABS: 75.0000 mg | ORAL_TABLET | Freq: Every day | ORAL | 3 refills | Status: DC

## 2019-09-01 NOTE — Assessment & Plan Note (Signed)
B/l LE edema - she is sitting all day with her legs down, no exercise Taking lasix 20 mg daily  cmp The swelling does not bother her - will monitor for now and try to avoid increasing lasix but we can if needed

## 2019-09-01 NOTE — Assessment & Plan Note (Signed)
Check a1c Low sugar / carb diet   

## 2019-09-01 NOTE — Assessment & Plan Note (Signed)
?   Well controlled - she is sleeping well -- I think some of her depression is related to her dementia - she has been on a couple of different medications and there has been no change Continue remeron at night

## 2019-09-01 NOTE — Assessment & Plan Note (Signed)
Check lipid panel  Continue daily statin  

## 2019-09-01 NOTE — Assessment & Plan Note (Signed)
cmp

## 2019-09-01 NOTE — Assessment & Plan Note (Signed)
BP well controlled Current regimen effective and well tolerated Continue current medications at current doses cmp  

## 2019-09-01 NOTE — Assessment & Plan Note (Signed)
Moderate dementia with episodes of agitation Continue aricept and namenda  Her husband feels he needs something for when she gets agitated -- advised to try to avoid medication - can try haldol  0.5 mg every 6 hrs as needed.  Discussed possible side effects He will look into getting a CNA 5 days a week which will likely help

## 2019-09-01 NOTE — Assessment & Plan Note (Signed)
Her husband noticed left sided weakness about one month ago and feels she may have had a stroke There is some weakness in the left side Possible CVA one month ago I do not think she will tolerate a MRI -will get a CT Stop ASA 81 mg  Start plavix  Continue lipitor

## 2019-09-02 ENCOUNTER — Encounter: Payer: Self-pay | Admitting: Internal Medicine

## 2019-09-03 ENCOUNTER — Encounter: Payer: Self-pay | Admitting: Internal Medicine

## 2019-09-03 ENCOUNTER — Ambulatory Visit (INDEPENDENT_AMBULATORY_CARE_PROVIDER_SITE_OTHER)
Admission: RE | Admit: 2019-09-03 | Discharge: 2019-09-03 | Disposition: A | Discharge status: Home / Self Care | Payer: Medicare Other | Source: Ambulatory Visit | Attending: Internal Medicine | Admitting: Internal Medicine

## 2019-09-03 ENCOUNTER — Other Ambulatory Visit: Payer: Self-pay

## 2019-09-03 DIAGNOSIS — R51 Headache: Secondary | ICD-10-CM | POA: Diagnosis not present

## 2019-09-03 DIAGNOSIS — R531 Weakness: Secondary | ICD-10-CM | POA: Diagnosis not present

## 2019-09-09 ENCOUNTER — Telehealth: Payer: Self-pay

## 2019-09-09 NOTE — Telephone Encounter (Signed)
Copied from [place] [id number]. Topic: General - Inquiry >> Sep 08, 2019  1:02 PM Selinda Flavin B, NT wrote: Reason for CRM: Patient's husband calling to check on CT results. States that the patient's MyChart does not work and he is unable to see any information on her MyChart. Requests that in the future, messages be sent to his MyChart. Would like to know if someone could give him a call with these results? CB#: [phone]

## 2019-09-09 NOTE — Telephone Encounter (Signed)
Pts husband is aware of results and expressed understanding.

## 2019-09-27 ENCOUNTER — Other Ambulatory Visit: Payer: Self-pay | Admitting: Internal Medicine

## 2019-09-28 ENCOUNTER — Other Ambulatory Visit: Payer: Self-pay | Admitting: Internal Medicine

## 2019-09-28 MED ORDER — HALOPERIDOL 0.5 MG PO TABS: 0.5000 mg | ORAL_TABLET | Freq: Three times a day (TID) | ORAL | 2 refills | Status: DC | PRN

## 2019-10-19 ENCOUNTER — Other Ambulatory Visit (INDEPENDENT_AMBULATORY_CARE_PROVIDER_SITE_OTHER): Payer: Medicare Other

## 2019-10-19 ENCOUNTER — Other Ambulatory Visit: Payer: Self-pay | Admitting: Internal Medicine

## 2019-10-19 DIAGNOSIS — R350 Frequency of micturition: Secondary | ICD-10-CM

## 2019-10-19 LAB — URINALYSIS, ROUTINE W REFLEX MICROSCOPIC
Bilirubin Urine: NEGATIVE
Hgb urine dipstick: NEGATIVE
Ketones, ur: NEGATIVE
Nitrite: NEGATIVE
Specific Gravity, Urine: >=1.03 — AB (ref 1.000–1.030)
Total Protein, Urine: NEGATIVE
Urine Glucose: NEGATIVE
Urobilinogen, UA: 0.2 (ref 0.0–1.0)
pH: 6 (ref 5.0–8.0)

## 2019-10-21 LAB — URINE CULTURE
MICRO NUMBER:: 1079055
SPECIMEN QUALITY:: ADEQUATE

## 2019-10-25 ENCOUNTER — Other Ambulatory Visit: Payer: Self-pay | Admitting: Internal Medicine

## 2019-10-27 NOTE — Progress Notes (Signed)
Virtual Visit via Video Note  I connected with Toni [name] on 10/28/19 at 10:30 AM EST by a video enabled telemedicine application and verified that I am speaking with the correct person using two identifiers.   I discussed the limitations of evaluation and management by telemedicine and the availability of in person appointments. The patient expressed understanding and agreed to proceed.  Present for the visit:  Myself, Dr Billey Gosling, Toni [name] and her husband Richard.  Her CNA is also present, but does not contribute to the visit.  The patient is currently at home and I am in the office.    No referring provider.    Richard provides the history due to patient's dementia.    History of Present Illness: This is an acute visit for swollen ankle.    Her left lower leg has been more swollen than the right for the past 1-2 days.  They deny any falls or injury.  She is not experiencing any pain in the leg.  She does have right leg swelling, but for some reason the left is worse.  She is very sedentary.  She denies any fevers, changes in the color of her skin on the left lower leg, shortness of breath.  Her husband has noticed some discoloration in the area of her tailbone.  It has been present for about 3 weeks.  There has been no pain.  There has not been any falls.  He states it looks like a bruise and today it does not look lighter.  She does experience chronic lower back pain, especially first thing in the morning after not moving all night.  Her urination has changed over the past few weeks.  We did check her urine last week and there is no evidence of infection.  She will urinate first thing in the morning and then not until the afternoon, but then will go several times in the evening.  Her urine color appears normal.   Review of Systems  Constitutional: Negative for fever.  Respiratory: Negative for cough, shortness of breath and wheezing.   Cardiovascular: Positive  for chest pain (occ, ? anxiety) and leg swelling.  Genitourinary: Positive for dysuria. Negative for frequency and hematuria.  Skin: Negative for rash.       No erythema  Neurological: Negative for headaches.     Social History   Socioeconomic History  . Marital status: Married    Spouse name: Not on file  . Number of children: 2  . Years of education: Not on file  . Highest education level: Not on file  Occupational History  . Not on file  Social Needs  . Financial resource strain: Not on file  . Food insecurity    Worry: Not on file    Inability: Not on file  . Transportation needs    Medical: Not on file    Non-medical: Not on file  Tobacco Use  . Smoking status: Former Smoker    Quit date: 12/10/1973    Years since quitting: 45.9  . Smokeless tobacco: Never Used  Substance and Sexual Activity  . Alcohol use: Yes    Alcohol/week: 0.0 standard drinks    Comment: 2 glasses of wine daily   . Drug use: No  . Sexual activity: Not on file  Lifestyle  . Physical activity    Days per week: Not on file    Minutes per session: Not on file  . Stress: Not on file  Relationships  .  Social Herbalist on phone: Not on file    Gets together: Not on file    Attends religious service: Not on file    Active member of club or organization: Not on file    Attends meetings of clubs or organizations: Not on file    Relationship status: Not on file  Other Topics Concern  . Not on file  Social History Narrative  . Not on file     Observations/Objective: Appears well in NAD Left lower extremity does appear larger than the right, but quality of the video is not great and it is difficult to appreciate how much.  No obvious erythema, but difficult to see well She is breathing normally and able to speak in full sentences without difficulty  Assessment and Plan:  He will monitor other symptoms, especially discoloration of the sacral region.  Possibly early pressure ulcer,  but he states it is improving.   See Problem List for Assessment and Plan of chronic medical problems.   Follow Up Instructions:    I discussed the assessment and treatment plan with the patient. The patient was provided an opportunity to ask questions and all were answered. The patient agreed with the plan and demonstrated an understanding of the instructions.   The patient was advised to call back or seek an in-person evaluation if the symptoms worsen or if the condition fails to improve as anticipated.    Binnie Rail, MD

## 2019-10-28 ENCOUNTER — Other Ambulatory Visit: Payer: Self-pay

## 2019-10-28 ENCOUNTER — Ambulatory Visit (INDEPENDENT_AMBULATORY_CARE_PROVIDER_SITE_OTHER): Payer: Medicare Other | Admitting: Internal Medicine

## 2019-10-28 ENCOUNTER — Encounter: Payer: Self-pay | Admitting: Internal Medicine

## 2019-10-28 ENCOUNTER — Ambulatory Visit: Payer: Medicare Other | Admitting: Internal Medicine

## 2019-10-28 DIAGNOSIS — M7989 Other specified soft tissue disorders: Secondary | ICD-10-CM

## 2019-10-28 NOTE — Assessment & Plan Note (Signed)
Left lower leg is more swollen than the right for no apparent reason No falls, injury No pain or other concerning symptoms such as fever, change in skin or shortness of breath Need to rule out a DVT-we will order an ultrasound If that is negative can treat symptomatically She is taking furosemide 20 mg daily

## 2019-10-29 ENCOUNTER — Encounter (HOSPITAL_COMMUNITY): Payer: Self-pay

## 2019-10-29 ENCOUNTER — Ambulatory Visit (HOSPITAL_COMMUNITY)
Admission: RE | Admit: 2019-10-29 | Discharge: 2019-10-29 | Disposition: A | Discharge status: Home / Self Care | Payer: Medicare Other | Source: Ambulatory Visit | Attending: Cardiology | Admitting: Cardiology

## 2019-10-29 DIAGNOSIS — M7989 Other specified soft tissue disorders: Secondary | ICD-10-CM | POA: Insufficient documentation

## 2019-10-29 NOTE — Progress Notes (Unsigned)
Left lower venous has been completed and is negative for DVT. Preliminary results can be found under CV proc through chart review.  Stesha Neyens RVT [facility]  

## 2019-11-01 ENCOUNTER — Other Ambulatory Visit: Payer: Self-pay | Admitting: Internal Medicine

## 2019-11-25 ENCOUNTER — Ambulatory Visit: Payer: Self-pay | Admitting: *Deleted

## 2019-11-25 ENCOUNTER — Encounter: Payer: Self-pay | Admitting: Internal Medicine

## 2019-11-25 ENCOUNTER — Other Ambulatory Visit: Payer: Self-pay

## 2019-11-25 ENCOUNTER — Ambulatory Visit (INDEPENDENT_AMBULATORY_CARE_PROVIDER_SITE_OTHER): Payer: Medicare Other | Admitting: Internal Medicine

## 2019-11-25 VITALS — BP 144/82 | HR 65 | Temp 98.1°F | Resp 14 | Ht 70.0 in

## 2019-11-25 DIAGNOSIS — F0391 Unspecified dementia with behavioral disturbance: Secondary | ICD-10-CM | POA: Diagnosis not present

## 2019-11-25 DIAGNOSIS — R600 Localized edema: Secondary | ICD-10-CM | POA: Diagnosis not present

## 2019-11-25 DIAGNOSIS — F03B18 Unspecified dementia, moderate, with other behavioral disturbance: Secondary | ICD-10-CM

## 2019-11-25 MED ORDER — ESCITALOPRAM OXALATE 10 MG PO TABS: 10.0000 mg | ORAL_TABLET | Freq: Every day | ORAL | 1 refills | Status: AC

## 2019-11-25 NOTE — Telephone Encounter (Signed)
Appointment scheduled this afternoon.

## 2019-11-25 NOTE — Telephone Encounter (Signed)
Husband calling for patient. She woke up this morning with Left lower leg/ankle/foot edema so much she cannot put shoe on. Denies SOB/pain/calf pain/redness/warmth and no fever. Takes Lasix 20 mg daily with good urine output yesterday and once this morning. Attempted warm transfer for appointment-no answer. Routing to office for contact with patient for appointment.Reviewed urgent symptoms that would require immediate evaluation.  Reason for Disposition . Swollen ankle joint  (Exception: area of localized swelling which is itchy)  Answer Assessment - Initial Assessment Questions 1. LOCATION: "Which joint is swollen?"     Left ankle and foot and lower leg 2. ONSET: "When did the swelling start?"     today 3. SIZE: "How large is the swelling?"     Cannot get her shoe on. 4. PAIN: "Is there any pain?" If so, ask: "How bad is it?" (Scale 1-10; or mild, moderate, severe)    mild 5. CAUSE: "What do you think caused the swollen joint?"     unsure 6. OTHER SYMPTOMS: "Do you have any other symptoms?" (e.g., fever, chest pain, difficulty breathing, calf pain)     none 7. PREGNANCY: "Is there any chance you are pregnant?" "When was your last menstrual period?"     na  Protocols used: [employer]

## 2019-11-25 NOTE — Patient Instructions (Addendum)
Try elevating the leg and using compression socks.      Monitor the swelling in the legs.     Start lexapro 10 mg daily.  Stop mirtazapine at night.  Stop haloperidol.      Update me with how this works.

## 2019-11-25 NOTE — Assessment & Plan Note (Signed)
Left leg was more swollen than the right earlier this morning for no apparent reason Swelling has since decreased and now they are equivalent No pain, no swelling Similar episode 1 month ago and we did do an ultrasound at that time and there is no DVT She is taking Lasix 20 mg daily-continue She has chronic venous insufficiency in the left tends to get more swollen than the right at times, but does improve on its own so at this point I do not think we need to do any further imaging We will treat symptomatically Elevate legs when sitting Trial of compression socks/wrapping legs to see if that helps Her husband will update me via MyChart

## 2019-11-25 NOTE — Assessment & Plan Note (Signed)
She is experiencing some sundowning and at this time is agitated and states she has difficulty breathing He has tried the Haldol and has not worked well She seems to be sleeping well Discontinue Haldol and Remeron Start Lexapro 10 mg daily-hopefully this will help with some generalized anxiety and what could be anxiety episodes in the evening Her husband will update me and we can adjust medication as needed She does see Dr. Delice Lesch next month

## 2019-11-25 NOTE — Progress Notes (Signed)
Subjective:    Patient ID: AMBREE HERSON, female    DOB: Apr 28, 1934, 85 y.o.   MRN: [id number]  HPI The patient is here for an acute visit.  She is here today with her husband.   Left lower leg/foot swelling:  This morning her left leg and foot was swollen.  Her right lower extremity was also swollen, but not to the same degree as the left side.  Since this morning it has gone down and now the 2 legs look similar.  There is no injury to the leg.  There is no redness and she never experienced any pain.  She has not had any shortness of breath or chest pain.  There have been no changes in urination.  She does take the furosemide daily as prescribed.   She had a similar episode about 1 month ago.  We did an ultrasound of the left lower extremity at that time and there was no DVT.  The swelling did improve.  Her husband does think that in the past her left lower extremity has been slightly larger than the right lower extremity.  She does have chronic edema.  She does not always elevate her legs.  She is currently not using compression socks on a daily basis.  There have been no fevers or chills.  Her husband states that she has been sundowning more in the afternoon/evening.  Haldol does not seem to be effective.  At this time she will comply have not been able to breathe and she is agitated.  She is sleeping well.  He does feel that she is depressed.  Medications and allergies reviewed with patient and updated if appropriate.  Patient Active Problem List   Diagnosis Date Noted  . Left leg swelling 10/28/2019  . Left-sided weakness 09/01/2019  . Closed fracture of left distal radius and ulna, initial encounter 03/25/2019  . Left wrist pain 03/24/2019  . Poor balance 03/24/2019  . Urinary incontinence 08/26/2018  . Skin irritation 06/20/2018  . Knee pain, bilateral 04/09/2018  . Prediabetes 12/19/2017  . Edema 01/30/2017  . Colostomy status ([place]) 01/07/2017  . NSTEMI (non-ST  elevated myocardial infarction) ([place]) 10/26/2016  . S/P laparoscopic colectomy 10/19/2016  . Colovesical fistula 08/27/2016  . Chronic fatigue 03/30/2016  . Moderate dementia with behavioral disturbance ([place]) 03/22/2016  . Hyperlipidemia 03/22/2016  . Depression 09/21/2015  . History of stroke 09/21/2015  . CKD (chronic kidney disease)   . Lesion of right native kidney 04/13/2015  . Diastolic CHF ([place]) 123456  . History of Clostridium difficile infection 04/05/2015  . Essential hypertension 03/23/2015  . Abnormal CT of brain 03/04/2015  . Cervicogenic headache 01/22/2012  . Diverticulosis of large intestine 10/28/2009  . DIVERTICULITIS, HX OF 10/28/2009  . COLONIC POLYPS, HX OF 03/07/2009  . Anxiety 07/28/2008  . DRY EYE SYNDROME 03/12/2008  . HEPATIC CYST 04/21/2007  . MIGRAINES, HX OF 04/21/2007    Current Outpatient Medications on File Prior to Visit  Medication Sig Dispense Refill  . atorvastatin (LIPITOR) 20 MG tablet TAKE 1 TABLET BY MOUTH AT  BEDTIME 90 tablet 0  . Benzethonium Chloride (ANTI-BACTERIAL CLEANSING WIPES) 0.18 % MISC Apply Topically. 25 each 1  . buPROPion (WELLBUTRIN XL) 150 MG 24 hr tablet Take 1 tablet twice a day 180 tablet 3  . clopidogrel (PLAVIX) 75 MG tablet Take 1 tablet (75 mg total) by mouth daily. 90 tablet 3  . cyanocobalamin 500 MCG tablet Take 500 mcg by mouth daily.    Marland Kitchen  donepezil (ARICEPT) 10 MG tablet Take 1 tablet (10 mg total) by mouth at bedtime. 90 tablet 3  . furosemide (LASIX) 20 MG tablet Take 1 tablet by mouth daily. 90 tablet 0  . haloperidol (HALDOL) 0.5 MG tablet Take 1 tablet (0.5 mg total) by mouth every 8 (eight) hours as needed for agitation. 180 tablet 2  . isosorbide mononitrate (IMDUR) 60 MG 24 hr tablet TAKE 1 TABLET BY MOUTH  DAILY 90 tablet 3  . memantine (NAMENDA) 10 MG tablet Take 1 tablet twice a day 180 tablet 3  . metoprolol tartrate (LOPRESSOR) 50 MG tablet TAKE 1 TABLET BY MOUTH  TWICE DAILY 180 tablet 1  .  mirtazapine (REMERON) 30 MG tablet TAKE 1 TABLET BY MOUTH AT  BEDTIME 90 tablet 1  . Ostomy Supplies (CLOSED-END COLOSTOMY POUCH) MISC Use as directed 20 each 0  . Ostomy Supplies (ODOR SPRAY) LIQD uad 118 mL 11   No current facility-administered medications on file prior to visit.    Past Medical History:  Diagnosis Date  . Acute kidney injury ([place])   . C. difficile colitis 2016  . Colonic polyp   . Depression   . Diverticulosis   . GERD (gastroesophageal reflux disease)   . Headache    sinus  . Helicobacter pylori gastritis 1987  . Hepatic cyst   . Hyperlipemia   . Hypertension   . Pneumonia    hx of x 2   . PUD (peptic ulcer disease) 1987   with h pylori    Past Surgical History:  Procedure Laterality Date  . ABDOMINAL HYSTERECTOMY     BSO ; endometriosis; ? Appendectomy incidentally  . CHOLECYSTECTOMY  2008  . COLONOSCOPY  2003 & 2012   polyps; Dr Collene Mares  . CYSTOSCOPY  10/19/2016   Procedure: CYSTOSCOPY;  Surgeon: Nickie Retort, MD;  Location: [facility];  Service: Urology;;  . endoscopy gastritis  2008  . FLEXIBLE SIGMOIDOSCOPY N/A 05/31/2016   Procedure: FLEXIBLE SIGMOIDOSCOPY;  Surgeon: Juanita Craver, MD;  Location: [facility];  Service: Endoscopy;  Laterality: N/A;  . LAPAROSCOPIC PARTIAL COLECTOMY N/A 10/19/2016   Procedure: LAPAROSCOPIC ASSISTED SIGMIOD COLOECTOMY MOBILIZATION OF SPLEENIC [place];  Surgeon: Jackolyn Confer, MD;  Location: [facility];  Service: General;  Laterality: N/A;  . ROTATOR CUFF REPAIR     right  . TONSILLECTOMY AND ADENOIDECTOMY      Social History   Socioeconomic History  . Marital status: Married    Spouse name: Not on file  . Number of children: 2  . Years of education: Not on file  . Highest education level: Not on file  Occupational History  . Not on file  Tobacco Use  . Smoking status: Former Smoker    Quit date: 12/10/1973    Years since quitting: 45.9  . Smokeless tobacco: Never Used  Substance and Sexual  Activity  . Alcohol use: Yes    Alcohol/week: 0.0 standard drinks    Comment: 2 glasses of wine daily   . Drug use: No  . Sexual activity: Not on file  Other Topics Concern  . Not on file  Social History Narrative  . Not on file   Social Determinants of Health   Financial Resource Strain:   . Difficulty of Paying Living Expenses: Not on file  Food Insecurity:   . Worried About Charity fundraiser in the Last Year: Not on file  . Ran Out of Food in the Last Year: Not on file  Transportation  Needs:   . Lack of Transportation (Medical): Not on file  . Lack of Transportation (Non-Medical): Not on file  Physical Activity:   . Days of Exercise per Week: Not on file  . Minutes of Exercise per Session: Not on file  Stress:   . Feeling of Stress : Not on file  Social Connections:   . Frequency of Communication with Friends and Family: Not on file  . Frequency of Social Gatherings with Friends and Family: Not on file  . Attends Religious Services: Not on file  . Active Member of Clubs or Organizations: Not on file  . Attends Archivist Meetings: Not on file  . Marital Status: Not on file    Family History  Problem Relation Age of Onset  . Asthma Brother   . Hypertension Father   . Heart attack Father 43  . Leukemia Mother   . Stomach cancer Maternal Aunt   . Breast cancer Sister   . Thyroid disease Sister   . Stroke Neg Hx   . Diabetes Neg Hx     Review of Systems  Constitutional: Negative for fever.  Respiratory: Negative for shortness of breath.   Cardiovascular: Positive for leg swelling. Negative for chest pain and palpitations.  Skin: Negative for color change and wound.  Neurological: Negative for light-headedness and headaches.       Objective:   Vitals:   11/25/19 1429  BP: (!) 144/82  Pulse: 65  Resp: 14  Temp: 98.1 F (36.7 C)  SpO2: 96%   BP Readings from Last 3 Encounters:  11/25/19 (!) 144/82  09/01/19 118/64  04/14/19 (!) 142/66    Wt Readings from Last 3 Encounters:  04/14/19 210 lb (95.3 kg)  03/25/19 208 lb (94.3 kg)  03/24/19 208 lb (94.3 kg)   Body mass index is 30.13 kg/m.   Physical Exam Constitutional:      Appearance: Normal appearance.  Cardiovascular:     Rate and Rhythm: Normal rate and regular rhythm.  Pulmonary:     Effort: Pulmonary effort is normal. No respiratory distress.     Breath sounds: Normal breath sounds. No wheezing or rales.  Musculoskeletal:        General: No tenderness.     Right lower leg: Edema (1+ mild pitting edema) present.     Left lower leg: Edema (1+ mild pitting edema) present.  Skin:    General: Skin is warm and dry.     Findings: No erythema, lesion or rash.  Neurological:     Mental Status: She is alert.            Assessment & Plan:    See Problem List for Assessment and Plan of chronic medical problems.    This visit occurred during the SARS-CoV-2 public health emergency.  Safety protocols were in place, including screening questions prior to the visit, additional usage of staff PPE, and extensive cleaning of exam room while observing appropriate contact time as indicated for disinfecting solutions.

## 2019-11-29 ENCOUNTER — Other Ambulatory Visit: Payer: Self-pay | Admitting: Internal Medicine

## 2019-11-30 ENCOUNTER — Other Ambulatory Visit: Payer: Self-pay | Admitting: Internal Medicine

## 2019-11-30 ENCOUNTER — Other Ambulatory Visit: Payer: Self-pay

## 2019-11-30 ENCOUNTER — Telehealth: Payer: Self-pay | Admitting: Neurology

## 2019-11-30 DIAGNOSIS — F03B18 Unspecified dementia, moderate, with other behavioral disturbance: Secondary | ICD-10-CM

## 2019-11-30 DIAGNOSIS — F0391 Unspecified dementia with behavioral disturbance: Secondary | ICD-10-CM

## 2019-11-30 MED ORDER — CLOPIDOGREL BISULFATE 75 MG PO TABS: 75.0000 mg | ORAL_TABLET | Freq: Every day | ORAL | 0 refills | Status: AC

## 2019-11-30 MED ORDER — BUPROPION HCL ER (XL) 150 MG PO TB24: ORAL_TABLET | ORAL | 0 refills | Status: DC

## 2019-11-30 NOTE — Telephone Encounter (Signed)
Pt is out of medication and asking for 1 week supply until mail order is received Requested Prescriptions  Pending Prescriptions Disp Refills  . clopidogrel (PLAVIX) 75 MG tablet 7 tablet 0    Sig: Take 1 tablet (75 mg total) by mouth daily.     Hematology: Antiplatelets - clopidogrel Failed - 11/30/2019 11:19 AM      Failed - Evaluate AST, ALT within 2 months of therapy initiation.      Passed - ALT in normal range and within 360 days    ALT  Date Value Ref Range Status  09/01/2019 13 0 - 35 U/L Final         Passed - AST in normal range and within 360 days    AST  Date Value Ref Range Status  09/01/2019 13 0 - 37 U/L Final         Passed - HCT in normal range and within 180 days    HCT  Date Value Ref Range Status  09/01/2019 40.5 36.0 - 46.0 % Final         Passed - HGB in normal range and within 180 days    Hemoglobin  Date Value Ref Range Status  09/01/2019 13.8 12.0 - 15.0 g/dL Final         Passed - PLT in normal range and within 180 days    Platelets  Date Value Ref Range Status  09/01/2019 308.0 150.0 - 400.0 K/uL Final         Passed - Valid encounter within last 6 months    Recent Outpatient Visits          5 days ago Moderate dementia with behavioral disturbance ([place])   [place], Claudina Lick, MD   1 month ago Left leg swelling   [place], Claudina Lick, MD   3 months ago Essential hypertension   [place], Claudina Lick, MD   7 months ago Left wrist pain   [place], [place], DO   8 months ago Left wrist pain   [place], [place], DO      Future Appointments            In 3 weeks Delice Lesch, Lezlie Octave, MD [facility]rology [facility]

## 2019-11-30 NOTE — Telephone Encounter (Signed)
Medication Refill - Medication: clopidogrel (PLAVIX) 75 MG tablet  Has the patient contacted their pharmacy? Yes.   Patient is out and mail order will take a week for her to get it and she would like some to be send in as a emergency refill (Agent: If no, request that the patient contact the pharmacy for the refill.) (Agent: If yes, when and what did the pharmacy advise?)  Preferred Pharmacy (with phone number or street name): [id number] - [place], [place]: Please be advised that RX refills may take up to 3 business days. We ask that you follow-up with your pharmacy.

## 2019-11-30 NOTE — Telephone Encounter (Signed)
Bupropion 150mg  #16 with 0 refills sent to [facility]

## 2019-11-30 NOTE — Telephone Encounter (Signed)
Husband and Caregiver called regarding his wife and her needing a refill on her medication Bupropion sent into [facility] on [address] [place]. Her husband [facility] to have it filled but it will not arrive until 12/08/19 and she will need it by 12/03/19. He was hoping to get enough to last her until she can have hers delivered. Thank you

## 2019-12-16 ENCOUNTER — Encounter: Payer: Self-pay | Admitting: Internal Medicine

## 2019-12-16 ENCOUNTER — Telehealth: Payer: Self-pay

## 2019-12-16 NOTE — Telephone Encounter (Signed)
See mychart.  

## 2019-12-16 NOTE — Telephone Encounter (Signed)
This message was sent by patients husband through his mychart. I let him know that when either one of [place] respond, it will be back through her mychart. Please advise   "After our last visit we changed the medication for Toni [name] 02/26/1934.  She seems to have completely shut down.  she can't walk without total major assistance, has absolutely no appetite & will not eat  and hardly ever speaks.  Have tried to increase fluid intake but have had limited success  What should I do? Thanks......Marland KitchenSaundra Shelling"

## 2019-12-17 ENCOUNTER — Emergency Department (HOSPITAL_COMMUNITY): Payer: Medicare Other

## 2019-12-17 ENCOUNTER — Encounter (HOSPITAL_COMMUNITY): Payer: Self-pay

## 2019-12-17 ENCOUNTER — Inpatient Hospital Stay (HOSPITAL_COMMUNITY)
Admission: EM | Admit: 2019-12-17 | Discharge: 2019-12-22 | DRG: 683 | Disposition: A | Discharge status: Home Health | Payer: Medicare Other | Attending: Internal Medicine | Admitting: Internal Medicine

## 2019-12-17 ENCOUNTER — Telehealth: Payer: Self-pay | Admitting: *Deleted

## 2019-12-17 ENCOUNTER — Other Ambulatory Visit: Payer: Self-pay

## 2019-12-17 DIAGNOSIS — R197 Diarrhea, unspecified: Secondary | ICD-10-CM

## 2019-12-17 DIAGNOSIS — F329 Major depressive disorder, single episode, unspecified: Secondary | ICD-10-CM | POA: Diagnosis present

## 2019-12-17 DIAGNOSIS — E785 Hyperlipidemia, unspecified: Secondary | ICD-10-CM | POA: Diagnosis present

## 2019-12-17 DIAGNOSIS — Z87891 Personal history of nicotine dependence: Secondary | ICD-10-CM

## 2019-12-17 DIAGNOSIS — N281 Cyst of kidney, acquired: Secondary | ICD-10-CM | POA: Diagnosis present

## 2019-12-17 DIAGNOSIS — Z7902 Long term (current) use of antithrombotics/antiplatelets: Secondary | ICD-10-CM | POA: Diagnosis not present

## 2019-12-17 DIAGNOSIS — I129 Hypertensive chronic kidney disease with stage 1 through stage 4 chronic kidney disease, or unspecified chronic kidney disease: Secondary | ICD-10-CM | POA: Diagnosis present

## 2019-12-17 DIAGNOSIS — A084 Viral intestinal infection, unspecified: Secondary | ICD-10-CM | POA: Diagnosis present

## 2019-12-17 DIAGNOSIS — Z825 Family history of asthma and other chronic lower respiratory diseases: Secondary | ICD-10-CM

## 2019-12-17 DIAGNOSIS — F0390 Unspecified dementia without behavioral disturbance: Secondary | ICD-10-CM | POA: Diagnosis present

## 2019-12-17 DIAGNOSIS — Z933 Colostomy status: Secondary | ICD-10-CM | POA: Diagnosis not present

## 2019-12-17 DIAGNOSIS — E876 Hypokalemia: Secondary | ICD-10-CM | POA: Diagnosis present

## 2019-12-17 DIAGNOSIS — E869 Volume depletion, unspecified: Secondary | ICD-10-CM | POA: Diagnosis present

## 2019-12-17 DIAGNOSIS — R748 Abnormal levels of other serum enzymes: Secondary | ICD-10-CM

## 2019-12-17 DIAGNOSIS — K7689 Other specified diseases of liver: Secondary | ICD-10-CM | POA: Diagnosis present

## 2019-12-17 DIAGNOSIS — Z806 Family history of leukemia: Secondary | ICD-10-CM

## 2019-12-17 DIAGNOSIS — I700 Atherosclerosis of aorta: Secondary | ICD-10-CM | POA: Diagnosis present

## 2019-12-17 DIAGNOSIS — N179 Acute kidney failure, unspecified: Secondary | ICD-10-CM | POA: Diagnosis present

## 2019-12-17 DIAGNOSIS — Z20822 Contact with and (suspected) exposure to covid-19: Secondary | ICD-10-CM | POA: Diagnosis present

## 2019-12-17 DIAGNOSIS — Z8249 Family history of ischemic heart disease and other diseases of the circulatory system: Secondary | ICD-10-CM

## 2019-12-17 DIAGNOSIS — Z800 Family history of malignant neoplasm of digestive organs: Secondary | ICD-10-CM

## 2019-12-17 DIAGNOSIS — Z8349 Family history of other endocrine, nutritional and metabolic diseases: Secondary | ICD-10-CM | POA: Diagnosis not present

## 2019-12-17 DIAGNOSIS — N1831 Chronic kidney disease, stage 3a: Secondary | ICD-10-CM | POA: Diagnosis present

## 2019-12-17 DIAGNOSIS — Z8719 Personal history of other diseases of the digestive system: Secondary | ICD-10-CM | POA: Diagnosis not present

## 2019-12-17 DIAGNOSIS — N189 Chronic kidney disease, unspecified: Secondary | ICD-10-CM | POA: Diagnosis not present

## 2019-12-17 DIAGNOSIS — N390 Urinary tract infection, site not specified: Secondary | ICD-10-CM | POA: Diagnosis present

## 2019-12-17 DIAGNOSIS — Z803 Family history of malignant neoplasm of breast: Secondary | ICD-10-CM | POA: Diagnosis not present

## 2019-12-17 DIAGNOSIS — R531 Weakness: Secondary | ICD-10-CM | POA: Diagnosis not present

## 2019-12-17 LAB — URINALYSIS, COMPLETE (UACMP) WITH MICROSCOPIC
Bilirubin Urine: NEGATIVE
Glucose, UA: NEGATIVE mg/dL
Hgb urine dipstick: NEGATIVE
Ketones, ur: NEGATIVE mg/dL
Nitrite: NEGATIVE
Protein, ur: 30 mg/dL — AB
Specific Gravity, Urine: 1.02 (ref 1.005–1.030)
pH: 5 (ref 5.0–8.0)

## 2019-12-17 LAB — CBC
HCT: 44.8 % (ref 36.0–46.0)
HCT: 41.3 % (ref 36.0–46.0)
Hemoglobin: 15.3 g/dL — ABNORMAL HIGH (ref 12.0–15.0)
Hemoglobin: 14 g/dL (ref 12.0–15.0)
MCH: 30.5 pg (ref 26.0–34.0)
MCH: 30.2 pg (ref 26.0–34.0)
MCHC: 34.2 g/dL (ref 30.0–36.0)
MCHC: 33.9 g/dL (ref 30.0–36.0)
MCV: 89.2 fL (ref 80.0–100.0)
MCV: 89 fL (ref 80.0–100.0)
Platelets: 366 10*3/uL (ref 150–400)
Platelets: 327 10*3/uL (ref 150–400)
RBC: 5.02 MIL/uL (ref 3.87–5.11)
RBC: 4.64 MIL/uL (ref 3.87–5.11)
RDW: 13.8 % (ref 11.5–15.5)
RDW: 13.6 % (ref 11.5–15.5)
WBC: 11.3 10*3/uL — ABNORMAL HIGH (ref 4.0–10.5)
WBC: 10.5 10*3/uL (ref 4.0–10.5)
nRBC: 0 % (ref 0.0–0.2)
nRBC: 0 % (ref 0.0–0.2)

## 2019-12-17 LAB — BASIC METABOLIC PANEL
Anion gap: 13 (ref 5–15)
BUN: 46 mg/dL — ABNORMAL HIGH (ref 8–23)
CO2: 21 mmol/L — ABNORMAL LOW (ref 22–32)
Calcium: 9.6 mg/dL (ref 8.9–10.3)
Chloride: 101 mmol/L (ref 98–111)
Creatinine, Ser: 2.79 mg/dL — ABNORMAL HIGH (ref 0.44–1.00)
GFR calc Af Amer: 17 mL/min — ABNORMAL LOW (ref >60)
GFR calc non Af Amer: 15 mL/min — ABNORMAL LOW (ref >60)
Glucose, Bld: 105 mg/dL — ABNORMAL HIGH (ref 70–99)
Potassium: 3.5 mmol/L (ref 3.5–5.1)
Sodium: 135 mmol/L (ref 135–145)

## 2019-12-17 LAB — LIPASE, BLOOD: Lipase: 106 U/L — ABNORMAL HIGH (ref 11–51)

## 2019-12-17 LAB — LACTIC ACID, PLASMA: Lactic Acid, Venous: 1.3 mmol/L (ref 0.5–1.9)

## 2019-12-17 LAB — HEPATIC FUNCTION PANEL
ALT: 19 U/L (ref 0–44)
AST: 17 U/L (ref 15–41)
Albumin: 4.5 g/dL (ref 3.5–5.0)
Alkaline Phosphatase: 144 U/L — ABNORMAL HIGH (ref 38–126)
Bilirubin, Direct: 0.1 mg/dL (ref 0.0–0.2)
Indirect Bilirubin: 0.9 mg/dL (ref 0.3–0.9)
Total Bilirubin: 1 mg/dL (ref 0.3–1.2)
Total Protein: 7.7 g/dL (ref 6.5–8.1)

## 2019-12-17 LAB — SODIUM, URINE, RANDOM: Sodium, Ur: <10 mmol/L

## 2019-12-17 LAB — PROTEIN / CREATININE RATIO, URINE
Creatinine, Urine: 317.87 mg/dL
Protein Creatinine Ratio: 0.22 mg/mg{Cre} — ABNORMAL HIGH (ref 0.00–0.15)
Total Protein, Urine: 71 mg/dL

## 2019-12-17 LAB — CREATININE, SERUM
Creatinine, Ser: 2.78 mg/dL — ABNORMAL HIGH (ref 0.44–1.00)
GFR calc Af Amer: 18 mL/min — ABNORMAL LOW (ref >60)
GFR calc non Af Amer: 15 mL/min — ABNORMAL LOW (ref >60)

## 2019-12-17 LAB — OCCULT BLOOD X 1 CARD TO LAB, STOOL: Fecal Occult Bld: POSITIVE — AB

## 2019-12-17 LAB — TSH: TSH: 1.436 u[IU]/mL (ref 0.350–4.500)

## 2019-12-17 LAB — PHOSPHORUS: Phosphorus: 5.1 mg/dL — ABNORMAL HIGH (ref 2.5–4.6)

## 2019-12-17 LAB — MAGNESIUM: Magnesium: 2.4 mg/dL (ref 1.7–2.4)

## 2019-12-17 MED ORDER — HEPARIN SODIUM (PORCINE) 5000 UNIT/ML IJ SOLN
5000.0000 [IU] | Freq: Three times a day (TID) | INTRAMUSCULAR | Status: DC
  Administered 2019-12-18 – 2019-12-21 (×6): 5000 [IU] via SUBCUTANEOUS
  Filled 2019-12-17 (×7): qty 1

## 2019-12-17 MED ORDER — METOPROLOL TARTRATE 50 MG PO TABS
50.0000 mg | ORAL_TABLET | Freq: Two times a day (BID) | ORAL | Status: DC
  Administered 2019-12-17 – 2019-12-22 (×9): 50 mg via ORAL
  Filled 2019-12-17 (×9): qty 1

## 2019-12-17 MED ORDER — SODIUM CHLORIDE 0.9% FLUSH: 3.0000 mL | Freq: Once | INTRAVENOUS | Status: DC

## 2019-12-17 MED ORDER — MEMANTINE HCL 10 MG PO TABS
10.0000 mg | ORAL_TABLET | Freq: Every day | ORAL | Status: DC
  Administered 2019-12-18 – 2019-12-22 (×5): 10 mg via ORAL
  Filled 2019-12-17 (×5): qty 1

## 2019-12-17 MED ORDER — ISOSORBIDE MONONITRATE ER 60 MG PO TB24
60.0000 mg | ORAL_TABLET | Freq: Every day | ORAL | Status: DC
  Administered 2019-12-18 – 2019-12-22 (×5): 60 mg via ORAL
  Filled 2019-12-17 (×5): qty 1

## 2019-12-17 MED ORDER — BUPROPION HCL ER (XL) 150 MG PO TB24
150.0000 mg | ORAL_TABLET | Freq: Every day | ORAL | Status: DC
  Administered 2019-12-18 – 2019-12-22 (×5): 150 mg via ORAL
  Filled 2019-12-17 (×5): qty 1

## 2019-12-17 MED ORDER — ESCITALOPRAM OXALATE 10 MG PO TABS
10.0000 mg | ORAL_TABLET | Freq: Every day | ORAL | Status: DC
  Administered 2019-12-18 – 2019-12-22 (×5): 10 mg via ORAL
  Filled 2019-12-17 (×5): qty 1

## 2019-12-17 MED ORDER — ATORVASTATIN CALCIUM 20 MG PO TABS
20.0000 mg | ORAL_TABLET | Freq: Every day | ORAL | Status: DC
  Administered 2019-12-17 – 2019-12-21 (×4): 20 mg via ORAL
  Filled 2019-12-17 (×5): qty 1

## 2019-12-17 MED ORDER — VITAMIN B-12 1000 MCG PO TABS
500.0000 ug | ORAL_TABLET | Freq: Every day | ORAL | Status: DC
  Administered 2019-12-18 – 2019-12-22 (×5): 500 ug via ORAL
  Filled 2019-12-17 (×5): qty 1

## 2019-12-17 MED ORDER — SODIUM CHLORIDE 0.9 % IV BOLUS
1000.0000 mL | Freq: Once | INTRAVENOUS | Status: AC
  Administered 2019-12-17: 17:00:00 1000 mL via INTRAVENOUS

## 2019-12-17 MED ORDER — CLOPIDOGREL BISULFATE 75 MG PO TABS
75.0000 mg | ORAL_TABLET | Freq: Every day | ORAL | Status: DC
  Administered 2019-12-18 – 2019-12-22 (×5): 75 mg via ORAL
  Filled 2019-12-17 (×5): qty 1

## 2019-12-17 MED ORDER — DONEPEZIL HCL 10 MG PO TABS
10.0000 mg | ORAL_TABLET | Freq: Every day | ORAL | Status: DC
  Administered 2019-12-17 – 2019-12-21 (×4): 10 mg via ORAL
  Filled 2019-12-17 (×4): qty 1

## 2019-12-17 MED ORDER — SODIUM CHLORIDE 0.9 % IV SOLN: INTRAVENOUS | Status: DC

## 2019-12-17 NOTE — ED Triage Notes (Signed)
Pt arrives today somewhat unsure of reason. Pt's abd is distended, and there is a colostomy bag in place. Pt is very weak , and her family states she is "sick".

## 2019-12-17 NOTE — Telephone Encounter (Signed)
Talking back with Toni [name], patient had was not able to void after drinking another eight ounces of water. Advised to have her evaluated at the ED. Patient will drive her at this time.

## 2019-12-17 NOTE — ED Triage Notes (Signed)
Spoke with husband. Per husband, pt had recent medication change. Pt has been having more liquid in colostomy than before. Pt has not voided per husband in 16 hours.

## 2019-12-17 NOTE — Telephone Encounter (Signed)
Toni [name] called in to inquire on hospital policy regarding him accompanying the patient to the ED.  While taking with him, requested he check some things, neurologically.  Patient can eat and swallow without difficulty and drooling but stats she is not hungry and does not feel like eating. No nausea or vomiting he reported.  Has had partial hotdog and approximately 16 oz of fluids today. Facial symmetry equal, no arm/ leg drift. Left hand grip weak he contributes that to the wrist being broken last year.  Her mobility is standing up and taking baby steps to get in the wheelchair. This seems to be a change from just one week ago. He stated she is coherent and answers his questions appropriately. Most concerning is she has not voiding since 10p last night. She has been on the toilet only once this morning. Her stool output is all liquid for the last 2 days which is a change from previously. I have ask him to have her sit on the commode for 15 minutes or so to see if she will go. I will be calling him back at 2:40p regarding any urine output. Read in the notes she stopped taking Haldol and Remeron 3-4 days ago after having taken them for some time. I did see the note that said he can go back to what she was taking, haldol and remeron. If she is able to void and he starts her back on the haldol and remeron, do you feel it necessary for him to take her to the ED at this time still? I feet I've done as thorough questioning possible at this time to help Mr. Toni [name] with the patient. Routing to physician for further advice, please.

## 2019-12-17 NOTE — Progress Notes (Signed)
ED TO INPATIENT HANDOFF REPORT  Name/Age/Gender Toni [name] 86 y.o. female  Code Status    Code Status Orders  (From admission, onward)         Start     Ordered   12/17/19 2025  Full code  Continuous     12/17/19 2024        Code Status History    Date Active Date Inactive Code Status Order ID Comments User Context   10/19/2016 1638 10/26/2016 1811 Full Code [id number]  Jackolyn Confer, MD Inpatient   05/03/2015 1746 05/06/2015 1300 Full Code [id number]  Oswald Hillock, MD Inpatient   04/05/2015 1457 04/10/2015 1526 Full Code [id number]  Rise Patience, MD Inpatient   03/24/2015 1749 04/01/2015 1502 Full Code [id number]  Kelvin Cellar, MD Inpatient   Advance Care Planning Activity      Home/SNF/Other Home  Chief Complaint AKI (acute kidney injury) ([place]) [N17.9]  Level of Care/Admitting Diagnosis ED Disposition    ED Disposition Condition [facility]: [place] [[id number]]  Level of Care: Med-Surg [16]  Covid Evaluation: Asymptomatic Screening Protocol (No Symptoms)  Diagnosis: AKI (acute kidney injury) ([place]) [id number]  Admitting Physician: Shirlean Mylar  Attending Physician: Dana Allan I [[id number]]  Estimated length of stay: 3 - 4 days  Certification:: I certify this patient will need inpatient services for at least 2 midnights       Medical History Past Medical History:  Diagnosis Date  . Acute kidney injury ([place])   . C. difficile colitis 2016  . Colonic polyp   . Depression   . Diverticulosis   . GERD (gastroesophageal reflux disease)   . Headache    sinus  . Helicobacter pylori gastritis 1987  . Hepatic cyst   . Hyperlipemia   . Hypertension   . Pneumonia    hx of x 2   . PUD (peptic ulcer disease) 1987   with h pylori    Allergies Allergies  Allergen Reactions  . Amlodipine Other (See Comments)    edema    IV Location/Drains/Wounds Patient Lines/Drains/Airways Status    Active Line/Drains/Airways    Name:   Placement date:   Placement time:   Site:   Days:   Peripheral IV 12/17/19 Left Antecubital   12/17/19    1630    Antecubital   less than 1   Colostomy LLQ   10/19/16    1240    LLQ   1154   Urethral Catheter Dr Pilar Jarvis Latex 20 Fr.   10/19/16    1125    Latex   1154   Incision (Closed) 10/19/16 Abdomen Other (Comment)   10/19/16    1228     1154   Incision - 3 Ports Abdomen 1: Mid;Upper Left;Lateral;Superior Left;Lateral;Upper   10/19/16    0825     1154          Labs/Imaging Results for orders placed or performed during the hospital encounter of 12/17/19 (from the past 48 hour(s))  Basic metabolic panel     Status: Abnormal   Collection Time: 12/17/19  4:35 PM  Result Value Ref Range   Sodium 135 135 - 145 mmol/L   Potassium 3.5 3.5 - 5.1 mmol/L   Chloride 101 98 - 111 mmol/L   CO2 21 (L) 22 - 32 mmol/L   Glucose, Bld 105 (H) 70 - 99 mg/dL   BUN 46 (H) 8 - 23 mg/dL  Creatinine, Ser 2.79 (H) 0.44 - 1.00 mg/dL   Calcium 9.6 8.9 - 10.3 mg/dL   GFR calc non Af Amer 15 (L) >60 mL/min   GFR calc Af Amer 17 (L) >60 mL/min   Anion gap 13 5 - 15    Comment: Performed at [facility], [place] [address] [place], [place] [address]  CBC     Status: Abnormal   Collection Time: 12/17/19  4:35 PM  Result Value Ref Range   WBC 11.3 (H) 4.0 - 10.5 K/uL   RBC 5.02 3.87 - 5.11 MIL/uL   Hemoglobin 15.3 (H) 12.0 - 15.0 g/dL   HCT 44.8 36.0 - 46.0 %   MCV 89.2 80.0 - 100.0 fL   MCH 30.5 26.0 - 34.0 pg   MCHC 34.2 30.0 - 36.0 g/dL   RDW 13.8 11.5 - 15.5 %   Platelets 366 150 - 400 K/uL   nRBC 0.0 0.0 - 0.2 %    Comment: Performed at [facility], [place] [address], [place], [place] [address]  Lipase, blood     Status: Abnormal   Collection Time: 12/17/19  4:35 PM  Result Value Ref Range   Lipase 106 (H) 11 - 51 U/L    Comment: Performed at [facility], [place] [address], [place], [place] [address]  TSH      Status: None   Collection Time: 12/17/19  4:35 PM  Result Value Ref Range   TSH 1.436 0.350 - 4.500 uIU/mL    Comment: Performed by a 3rd Generation assay with a functional sensitivity of <=0.01 uIU/mL. Performed at [facility], [place] [address] [place], [place] [address]   Hepatic function panel     Status: Abnormal   Collection Time: 12/17/19  4:36 PM  Result Value Ref Range   Total Protein 7.7 6.5 - 8.1 g/dL   Albumin 4.5 3.5 - 5.0 g/dL   AST 17 15 - 41 U/L   ALT 19 0 - 44 U/L   Alkaline Phosphatase 144 (H) 38 - 126 U/L   Total Bilirubin 1.0 0.3 - 1.2 mg/dL   Bilirubin, Direct 0.1 0.0 - 0.2 mg/dL   Indirect Bilirubin 0.9 0.3 - 0.9 mg/dL    Comment: Performed at [facility], [place] [address], [place], [place] [address]  Creatinine, serum     Status: Abnormal   Collection Time: 12/17/19  8:25 PM  Result Value Ref Range   Creatinine, Ser 2.78 (H) 0.44 - 1.00 mg/dL   GFR calc non Af Amer 15 (L) >60 mL/min   GFR calc Af Amer 18 (L) >60 mL/min    Comment: Performed at [facility], [place] [address], [place], [place] [address]  Magnesium     Status: None   Collection Time: 12/17/19  8:25 PM  Result Value Ref Range   Magnesium 2.4 1.7 - 2.4 mg/dL    Comment: Performed at [facility], [place] [address] [place], [place] [address]  Phosphorus     Status: Abnormal   Collection Time: 12/17/19  8:25 PM  Result Value Ref Range   Phosphorus 5.1 (H) 2.5 - 4.6 mg/dL    Comment: Performed at [facility], [place] [address] [place],  [address]  Urinalysis, Complete w Microscopic     Status: Abnormal   Collection Time: 12/17/19  9:10 PM  Result Value Ref Range   Color, Urine YELLOW YELLOW   APPearance HAZY (A) CLEAR  Specific Gravity, Urine 1.020 1.005 - 1.030   pH 5.0 5.0 - 8.0   Glucose, UA NEGATIVE NEGATIVE mg/dL   Hgb urine dipstick NEGATIVE NEGATIVE   Bilirubin Urine NEGATIVE NEGATIVE    Ketones, ur NEGATIVE NEGATIVE mg/dL   Protein, ur 30 (A) NEGATIVE mg/dL   Nitrite NEGATIVE NEGATIVE   Leukocytes,Ua MODERATE (A) NEGATIVE   RBC / HPF 6-10 0 - 5 RBC/hpf   WBC, UA 21-50 0 - 5 WBC/hpf   Bacteria, UA FEW (A) NONE SEEN   Squamous Epithelial / LPF 6-10 0 - 5   Mucus PRESENT    Hyaline Casts, UA PRESENT     Comment: Performed at [facility], [place] [address], [place], [place] [address]  Occult blood card to lab, stool     Status: Abnormal   Collection Time: 12/17/19  9:10 PM  Result Value Ref Range   Fecal Occult Bld POSITIVE (A) NEGATIVE    Comment: Performed at [facility], [place] [address], [place],  [address]   CT ABDOMEN PELVIS WO CONTRAST  Result Date: 12/17/2019 CLINICAL DATA:  Abdominal pain. EXAM: CT ABDOMEN AND PELVIS WITHOUT CONTRAST TECHNIQUE: Multidetector CT imaging of the abdomen and pelvis was performed following the standard protocol without IV contrast. COMPARISON:  August 07, 2016 FINDINGS: Lower chest: Mild atelectasis is seen in the posterior aspect right lung base. Hepatobiliary: A stable 11.9 cm x 10.7 cm cyst is seen within the right lobe of the liver. Additional smaller cysts are seen scattered throughout the liver parenchyma. Surgical clips are seen in the gallbladder fossa. Pancreas: Unremarkable. No pancreatic ductal dilatation or surrounding inflammatory changes. Spleen: Normal in size without focal abnormality. Adrenals/Urinary Tract: Adrenal glands are unremarkable. Kidneys are normal in size, without renal calculi or hydronephrosis. A stable 2.2 cm x 2.4 cm partially calcified low-attenuation mass is seen in the anterior aspect of the mid right kidney. Multiple stable bilateral renal cysts are seen. Bladder is unremarkable. Stomach/Bowel: There is a right lower quadrant ostomy site. There is a prior hand. Appendix appears normal. No evidence of bowel wall thickening, distention, or inflammatory changes.  Vascular/Lymphatic: Marked severity aortic atherosclerosis. No enlarged abdominal or pelvic lymph nodes. Reproductive: Status post hysterectomy. No adnexal masses. Other: No abdominal wall hernia or abnormality. No abdominopelvic ascites. Musculoskeletal: Multilevel degenerative changes seen throughout the lumbar spine. IMPRESSION: 1. Numerous large bilateral hepatic and renal cysts. 2. Evidence of prior cholecystectomy. 3. Small hiatal hernia. 4. Stable partially calcified low-attenuation renal mass. 5. Postop changes with a subsequent left lower quadrant ostomy site. Electronically Signed   By: Virgina Norfolk M.D.   On: 12/17/2019 18:27    Pending Labs Unresulted Labs (From admission, onward)    Start     Ordered   12/18/19 [id number]  Basic metabolic panel  Tomorrow morning,   R     12/17/19 2024   12/17/19 2100  CBC  Once,   R     12/17/19 2100   12/17/19 2025  Lactoferrin, Fecal,Qualitative  Once,   STAT     12/17/19 2024   12/17/19 2025  GI pathogen panel by PCR, stool  (Gastrointestinal Panel by PCR, Stool                                                                                                                                                     *  Does Not include CLOSTRIDIUM DIFFICILE testing.**If CDIFF testing is needed, select the C Difficile Quick Screen w PCR reflex order below)  Once,   STAT     12/17/19 2024   12/17/19 2025  Sodium, urine, random  Once,   STAT     12/17/19 2024   12/17/19 2025  Protein / creatinine ratio, urine  Once,   STAT     12/17/19 2024   12/17/19 2019  Lactic acid, plasma  STAT Now then every 3 hours,   R (with STAT occurrences)     12/17/19 2018   12/17/19 1843  SARS CORONAVIRUS 2 (TAT 6-24 HRS) Nasopharyngeal Nasopharyngeal Swab  (Tier 3 (TAT 6-24 hrs))  Once,   STAT    Question Answer Comment  Is this test for diagnosis or screening Screening   Symptomatic for COVID-19 as defined by CDC No   Hospitalized for COVID-19 No   Admitted to ICU for  COVID-19 No   Previously tested for COVID-19 No   Resident in a congregate (group) care setting No   Employed in healthcare setting No   Pregnant No      12/17/19 1842          Vitals/Pain Today's Vitals   12/17/19 2051 12/17/19 2100 12/17/19 2130 12/17/19 2200  BP: (!) 151/63 120/70 135/62 128/62  Pulse: 62 (!) 55 (!) 53 (!) 53  Resp: 18 17 18 16   Temp:      SpO2: 97% 97% 97% 98%  PainSc:        Isolation Precautions Enteric precautions (UV disinfection)  Medications Medications  sodium chloride flush (NS) 0.9 % injection 3 mL (has no administration in time range)  atorvastatin (LIPITOR) tablet 20 mg (has no administration in time range)  isosorbide mononitrate (IMDUR) 24 hr tablet 60 mg (has no administration in time range)  metoprolol tartrate (LOPRESSOR) tablet 50 mg (has no administration in time range)  buPROPion (WELLBUTRIN XL) 24 hr tablet 150 mg (has no administration in time range)  donepezil (ARICEPT) tablet 10 mg (has no administration in time range)  escitalopram (LEXAPRO) tablet 10 mg (has no administration in time range)  memantine (NAMENDA) tablet 10 mg (has no administration in time range)  clopidogrel (PLAVIX) tablet 75 mg (has no administration in time range)  vitamin B-12 (CYANOCOBALAMIN) tablet 500 mcg (has no administration in time range)  heparin injection 5,000 Units (has no administration in time range)  0.9 %  sodium chloride infusion (has no administration in time range)  sodium chloride 0.9 % bolus 1,000 mL (0 mLs Intravenous Stopped 12/17/19 1924)    Mobility unknown

## 2019-12-17 NOTE — H&P (Signed)
History and Physical  Toni [name] [id number] DOB: 29-Nov-1934 DOA: 12/17/2019  Referring physician: ER provider PCP: Binnie Rail, MD  Outpatient Specialists:    Patient coming from: Home  Chief Complaint: Watery diarrhea  HPI:  Patient is an 86 year old Caucasian female with past medical history significant for diverticulosis, status post partial colectomy (patient has a colostomy bag), C. difficile colitis, chronic kidney disease stage III, hypertension, hyperlipidemia, grade 1 diastolic dysfunction, colonic polyp and dementia.  Patient could not or will not give any significant history.  Most of the limited history came from the patient's caregiver.  According to the patient's caregiver, patient has been having watery stool over the last 3 to 4 days.  No associated nausea vomiting, no fever chills, no sick contacts and no abdominal pain.  Patient has had poor p.o. intake, and has barely made any urine in the last 24 hours.  On presentation to the hospital, leukocytosis of 11,300 was noted, serum creatinine of 2.79 (up from 1.17 from around September 2020).  Covid testing is pending.  CT scan of the abdomen done revealed numerous large bilateral hepatic and renal cyst, stable partially calcified renal mass with low attenuation and severe aortic atherosclerosis.  No pancreatic abnormality noted.  Lipase of 106 was documented.  No headache, no neck pain, no URI symptoms, no chest pain, no shortness of breath.  Hospitalist team has been asked to admit patient for further assessment and management.  ED Course: On presentation to the hospital, Temperature was 98.1, blood pressure 128/63, heart rate of 53 with respiratory rate of 16 and O2 sat of 98%.  Pertinent labs: As documented above.  EKG: Independently reviewed.   Imaging: independently reviewed.   Review of Systems: As documented above.  Patient is a very poor historian.    Past Medical History:  Diagnosis Date  . Acute kidney  injury ([place])   . C. difficile colitis 2016  . Colonic polyp   . Depression   . Diverticulosis   . GERD (gastroesophageal reflux disease)   . Headache    sinus  . Helicobacter pylori gastritis 1987  . Hepatic cyst   . Hyperlipemia   . Hypertension   . Pneumonia    hx of x 2   . PUD (peptic ulcer disease) 1987   with h pylori    Past Surgical History:  Procedure Laterality Date  . ABDOMINAL HYSTERECTOMY     BSO ; endometriosis; ? Appendectomy incidentally  . CHOLECYSTECTOMY  2008  . COLONOSCOPY  2003 & 2012   polyps; Dr Collene Mares  . CYSTOSCOPY  10/19/2016   Procedure: CYSTOSCOPY;  Surgeon: Nickie Retort, MD;  Location: [facility];  Service: Urology;;  . endoscopy gastritis  2008  . FLEXIBLE SIGMOIDOSCOPY N/A 05/31/2016   Procedure: FLEXIBLE SIGMOIDOSCOPY;  Surgeon: Juanita Craver, MD;  Location: [facility] ENDOSCOPY;  Service: Endoscopy;  Laterality: N/A;  . LAPAROSCOPIC PARTIAL COLECTOMY N/A 10/19/2016   Procedure: LAPAROSCOPIC ASSISTED SIGMIOD COLOECTOMY MOBILIZATION OF SPLEENIC [place];  Surgeon: Jackolyn Confer, MD;  Location: [facility];  Service: General;  Laterality: N/A;  . ROTATOR CUFF REPAIR     right  . TONSILLECTOMY AND ADENOIDECTOMY       reports that she quit smoking about 46 years ago. She has never used smokeless tobacco. She reports current alcohol use. She reports that she does not use drugs.  Allergies  Allergen Reactions  . Amlodipine Other (See Comments)    edema    Family History  Problem Relation Age  of Onset  . Asthma Brother   . Hypertension Father   . Heart attack Father 5  . Leukemia Mother   . Stomach cancer Maternal Aunt   . Breast cancer Sister   . Thyroid disease Sister   . Stroke Neg Hx   . Diabetes Neg Hx      Prior to Admission medications   Medication Sig Start Date End Date Taking? Authorizing Provider  atorvastatin (LIPITOR) 20 MG tablet TAKE 1 TABLET BY MOUTH AT  BEDTIME 09/28/19  Yes Burns, Claudina Lick, MD  buPROPion (WELLBUTRIN  XL) 150 MG 24 hr tablet Take 1 tablet twice a day 11/30/19  Yes Cameron Sprang, MD  clopidogrel (PLAVIX) 75 MG tablet Take 1 tablet (75 mg total) by mouth daily. 11/30/19  Yes Burns, Claudina Lick, MD  cyanocobalamin 500 MCG tablet Take 500 mcg by mouth daily.   Yes [provider]  donepezil (ARICEPT) 10 MG tablet Take 1 tablet (10 mg total) by mouth at bedtime. 05/22/19  Yes Cameron Sprang, MD  escitalopram (LEXAPRO) 10 MG tablet Take 1 tablet (10 mg total) by mouth daily. 11/25/19  Yes Burns, Claudina Lick, MD  furosemide (LASIX) 20 MG tablet TAKE 1 TABLET BY MOUTH  DAILY 11/30/19  Yes Burns, Claudina Lick, MD  isosorbide mononitrate (IMDUR) 60 MG 24 hr tablet TAKE 1 TABLET BY MOUTH  DAILY 03/08/19  Yes Binnie Rail, MD  memantine (NAMENDA) 10 MG tablet Take 1 tablet twice a day 05/22/19  Yes Cameron Sprang, MD  metoprolol tartrate (LOPRESSOR) 50 MG tablet TAKE 1 TABLET BY MOUTH  TWICE DAILY 11/02/19  Yes Burns, Claudina Lick, MD  Benzethonium Chloride (ANTI-BACTERIAL CLEANSING WIPES) 0.18 % MISC Apply Topically. 08/14/17   Binnie Rail, MD  Ostomy Supplies (CLOSED-END COLOSTOMY [facility]) MISC Use as directed 08/14/17   Binnie Rail, MD  Ostomy Supplies (ODOR SPRAY) LIQD uad 08/14/17   Binnie Rail, MD    Physical Exam: Vitals:   12/17/19 1730 12/17/19 1806 12/17/19 1830 12/17/19 1900  BP: 137/68 140/71 140/73 131/63  Pulse: (!) 53 (!) 57 (!) 50 (!) 51  Resp: 20 14 15 17   Temp:      SpO2: 97% 99% 97% 98%    Constitutional:  . Appears calm and comfortable Eyes:  . No pallor. No jaundice.  ENMT:  . external ears, nose appear normal.  Dry buccal mucosa. Neck:  . Neck is supple. No JVD Respiratory:  . CTA bilaterally, no w/r/r.  . Respiratory effort normal. No retractions or accessory muscle use Cardiovascular:  . S1S2 . No LE extremity edema   Abdomen:  . Abdomen is obese, soft and non tender. Organs are difficult to assess.  Patient has a colostomy bag Neurologic:  . Awake and  alert. . Moves all limbs.  Wt Readings from Last 3 Encounters:  04/14/19 95.3 kg  03/25/19 94.3 kg  03/24/19 94.3 kg    I have personally reviewed following labs and imaging studies  Labs on Admission:  CBC: Recent Labs  Lab 12/17/19 1635  WBC 11.3*  HGB 15.3*  HCT 44.8  MCV 89.2  PLT [id number]   Basic Metabolic Panel: Recent Labs  Lab 12/17/19 1635  NA 135  K 3.5  CL 101  CO2 21*  GLUCOSE 105*  BUN 46*  CREATININE 2.79*  CALCIUM 9.6   Liver Function Tests: Recent Labs  Lab 12/17/19 1636  AST 17  ALT 19  ALKPHOS 144*  BILITOT 1.0  PROT 7.7  ALBUMIN 4.5   Recent Labs  Lab 12/17/19 1635  LIPASE 106*   No results for input(s): AMMONIA in the last 168 hours. Coagulation Profile: No results for input(s): INR, PROTIME in the last 168 hours. Cardiac Enzymes: No results for input(s): CKTOTAL, CKMB, CKMBINDEX, TROPONINI in the last 168 hours. BNP (last 3 results) No results for input(s): PROBNP in the last 8760 hours. HbA1C: No results for input(s): HGBA1C in the last 72 hours. CBG: No results for input(s): GLUCAP in the last 168 hours. Lipid Profile: No results for input(s): CHOL, HDL, LDLCALC, TRIG, CHOLHDL, LDLDIRECT in the last 72 hours. Thyroid Function Tests: No results for input(s): TSH, T4TOTAL, FREET4, T3FREE, THYROIDAB in the last 72 hours. Anemia Panel: No results for input(s): VITAMINB12, FOLATE, FERRITIN, TIBC, IRON, RETICCTPCT in the last 72 hours. Urine analysis:    Component Value Date/Time   COLORURINE YELLOW 10/19/2019 1338   APPEARANCEUR [place] (A) 10/19/2019 1338   LABSPEC >=1.030 (A) 10/19/2019 1338   PHURINE 6.0 10/19/2019 1338   GLUCOSEU NEGATIVE 10/19/2019 1338   HGBUR NEGATIVE 10/19/2019 1338   BILIRUBINUR NEGATIVE 10/19/2019 1338   KETONESUR NEGATIVE 10/19/2019 1338   PROTEINUR 100 (A) 04/20/2016 1054   UROBILINOGEN 0.2 10/19/2019 1338   NITRITE NEGATIVE 10/19/2019 1338   LEUKOCYTESUR TRACE (A) 10/19/2019 1338   Sepsis  Labs: @LABRCNTIP (procalcitonin:4,lacticidven:4) )No results found for this or any previous visit (from the past 240 hour(s)).    Radiological Exams on Admission: CT ABDOMEN PELVIS WO CONTRAST  Result Date: 12/17/2019 CLINICAL DATA:  Abdominal pain. EXAM: CT ABDOMEN AND PELVIS WITHOUT CONTRAST TECHNIQUE: Multidetector CT imaging of the abdomen and pelvis was performed following the standard protocol without IV contrast. COMPARISON:  August 07, 2016 FINDINGS: Lower chest: Mild atelectasis is seen in the posterior aspect right lung base. Hepatobiliary: A stable 11.9 cm x 10.7 cm cyst is seen within the right lobe of the liver. Additional smaller cysts are seen scattered throughout the liver parenchyma. Surgical clips are seen in the gallbladder fossa. Pancreas: Unremarkable. No pancreatic ductal dilatation or surrounding inflammatory changes. Spleen: Normal in size without focal abnormality. Adrenals/Urinary Tract: Adrenal glands are unremarkable. Kidneys are normal in size, without renal calculi or hydronephrosis. A stable 2.2 cm x 2.4 cm partially calcified low-attenuation mass is seen in the anterior aspect of the mid right kidney. Multiple stable bilateral renal cysts are seen. Bladder is unremarkable. Stomach/Bowel: There is a right lower quadrant ostomy site. There is a prior hand. Appendix appears normal. No evidence of bowel wall thickening, distention, or inflammatory changes. Vascular/Lymphatic: Marked severity aortic atherosclerosis. No enlarged abdominal or pelvic lymph nodes. Reproductive: Status post hysterectomy. No adnexal masses. Other: No abdominal wall hernia or abnormality. No abdominopelvic ascites. Musculoskeletal: Multilevel degenerative changes seen throughout the lumbar spine. IMPRESSION: 1. Numerous large bilateral hepatic and renal cysts. 2. Evidence of prior cholecystectomy. 3. Small hiatal hernia. 4. Stable partially calcified low-attenuation renal mass. 5. Postop changes with a  subsequent left lower quadrant ostomy site. Electronically Signed   By: Virgina Norfolk M.D.   On: 12/17/2019 18:27    EKG: Independently reviewed.  Active Problems:   AKI (acute kidney injury) ([place])   Assessment/Plan Acute kidney injury on chronic kidney disease stage IIIa: -Suspect acute kidney injury is likely prerenal. -We will hydrate patient -Check urine sodium, urine protein and creatinine. -CT abdomen also revealed renal and hepatic cyst, but no prior documentation of polycystic kidney disease. -We will monitor renal function and electrolytes with  adequate hydration. -Further management depend on hospital course.  Watery diarrhea: -Etiology unclear. -CT abdomen and pelvis without contrast did not reveal colitis or diverticulitis, however, this is a noncontrast study. -Follow Covid testing to rule out possible Covid infection presenting with GI symptoms. -Stool fecal lactoferrin -Check GI panel -Supportive treatment for now.  Volume depletion: -Secondary to above -Continue with hydration.  Elevated lipase: -No other symptoms of pancreatitis reported. -Repeat level in the morning. -Further management will depend on hospital course  DVT prophylaxis: Subcutaneous heparin Code Status: Full code.  Readdress in the morning Family Communication: Caregiver Disposition Plan: This will depend on hospital course Consults called: None Admission status: Inpatient  Time spent: 65 minutes  Dana Allan, MD  [facility] Pager #: [phone] 7PM-7AM contact night coverage as above  12/17/2019, 8:19 PM

## 2019-12-17 NOTE — ED Provider Notes (Signed)
[facility] DEPT Provider Note   CSN: [id number] Arrival date & time: 12/17/19  1532     History Chief Complaint  Patient presents with  . Weakness  . Abdominal Pain    Toni [name] is a 86 y.o. female.  Patient brought to the emergency department because she is weak and she has been having loose stools in her colostomy bag for the last 3 to 4 days.  The history is provided by the patient and a relative.  Weakness Severity:  Moderate Onset quality:  Gradual Timing:  Constant Progression:  Worsening Chronicity:  New Context: not alcohol use   Relieved by:  Nothing Worsened by:  Nothing Ineffective treatments:  None tried Associated symptoms: abdominal pain and diarrhea   Associated symptoms: no anorexia, no chest pain, no cough, no frequency, no headaches and no seizures   Abdominal Pain Associated symptoms: diarrhea   Associated symptoms: no anorexia, no chest pain, no cough, no fatigue and no hematuria        Past Medical History:  Diagnosis Date  . Acute kidney injury ([place])   . C. difficile colitis 2016  . Colonic polyp   . Depression   . Diverticulosis   . GERD (gastroesophageal reflux disease)   . Headache    sinus  . Helicobacter pylori gastritis 1987  . Hepatic cyst   . Hyperlipemia   . Hypertension   . Pneumonia    hx of x 2   . PUD (peptic ulcer disease) 1987   with h pylori    Patient Active Problem List   Diagnosis Date Noted  . Left leg swelling 10/28/2019  . Left-sided weakness 09/01/2019  . Closed fracture of left distal radius and ulna, initial encounter 03/25/2019  . Left wrist pain 03/24/2019  . Poor balance 03/24/2019  . Urinary incontinence 08/26/2018  . Skin irritation 06/20/2018  . Knee pain, bilateral 04/09/2018  . Prediabetes 12/19/2017  . Bilateral leg edema 01/30/2017  . Colostomy status ([place]) 01/07/2017  . NSTEMI (non-ST elevated myocardial infarction) ([place]) 10/26/2016  . S/P  laparoscopic colectomy 10/19/2016  . Colovesical fistula 08/27/2016  . Chronic fatigue 03/30/2016  . Moderate dementia with behavioral disturbance ([place]) 03/22/2016  . Hyperlipidemia 03/22/2016  . Depression 09/21/2015  . History of stroke 09/21/2015  . CKD (chronic kidney disease)   . Lesion of right native kidney 04/13/2015  . Diastolic CHF ([place]) [id number]  . History of Clostridium difficile infection 04/05/2015  . Essential hypertension 03/23/2015  . Abnormal CT of brain 03/04/2015  . Cervicogenic headache 01/22/2012  . Diverticulosis of large intestine 10/28/2009  . DIVERTICULITIS, HX OF 10/28/2009  . COLONIC POLYPS, HX OF 03/07/2009  . Anxiety 07/28/2008  . DRY EYE SYNDROME 03/12/2008  . HEPATIC CYST 04/21/2007  . MIGRAINES, HX OF 04/21/2007    Past Surgical History:  Procedure Laterality Date  . ABDOMINAL HYSTERECTOMY     BSO ; endometriosis; ? Appendectomy incidentally  . CHOLECYSTECTOMY  2008  . COLONOSCOPY  2003 & 2012   polyps; Dr Collene Mares  . CYSTOSCOPY  10/19/2016   Procedure: CYSTOSCOPY;  Surgeon: Nickie Retort, MD;  Location: [facility];  Service: Urology;;  . endoscopy gastritis  2008  . FLEXIBLE SIGMOIDOSCOPY N/A 05/31/2016   Procedure: FLEXIBLE SIGMOIDOSCOPY;  Surgeon: Juanita Craver, MD;  Location: [facility];  Service: Endoscopy;  Laterality: N/A;  . LAPAROSCOPIC PARTIAL COLECTOMY N/A 10/19/2016   Procedure: LAPAROSCOPIC ASSISTED SIGMIOD COLOECTOMY MOBILIZATION OF SPLEENIC [place];  Surgeon: Jackolyn Confer, MD;  Location: [facility];  Service: General;  Laterality: N/A;  . ROTATOR CUFF REPAIR     right  . TONSILLECTOMY AND ADENOIDECTOMY       OB History   No obstetric history on file.     Family History  Problem Relation Age of Onset  . Asthma Brother   . Hypertension Father   . Heart attack Father 26  . Leukemia Mother   . Stomach cancer Maternal Aunt   . Breast cancer Sister   . Thyroid disease Sister   . Stroke Neg Hx   . Diabetes Neg Hx      Social History   Tobacco Use  . Smoking status: Former Smoker    Quit date: 12/10/1973    Years since quitting: 46.0  . Smokeless tobacco: Never Used  Substance Use Topics  . Alcohol use: Yes    Alcohol/week: 0.0 standard drinks    Comment: 2 glasses of wine daily   . Drug use: No    Home Medications Prior to Admission medications   Medication Sig Start Date End Date Taking? Authorizing Provider  atorvastatin (LIPITOR) 20 MG tablet TAKE 1 TABLET BY MOUTH AT  BEDTIME 09/28/19  Yes Burns, Claudina Lick, MD  buPROPion (WELLBUTRIN XL) 150 MG 24 hr tablet Take 1 tablet twice a day 11/30/19  Yes Cameron Sprang, MD  clopidogrel (PLAVIX) 75 MG tablet Take 1 tablet (75 mg total) by mouth daily. 11/30/19  Yes Burns, Claudina Lick, MD  cyanocobalamin 500 MCG tablet Take 500 mcg by mouth daily.   Yes [provider]  donepezil (ARICEPT) 10 MG tablet Take 1 tablet (10 mg total) by mouth at bedtime. 05/22/19  Yes Cameron Sprang, MD  escitalopram (LEXAPRO) 10 MG tablet Take 1 tablet (10 mg total) by mouth daily. 11/25/19  Yes Burns, Claudina Lick, MD  furosemide (LASIX) 20 MG tablet TAKE 1 TABLET BY MOUTH  DAILY 11/30/19  Yes Burns, Claudina Lick, MD  isosorbide mononitrate (IMDUR) 60 MG 24 hr tablet TAKE 1 TABLET BY MOUTH  DAILY 03/08/19  Yes Binnie Rail, MD  memantine (NAMENDA) 10 MG tablet Take 1 tablet twice a day 05/22/19  Yes Cameron Sprang, MD  metoprolol tartrate (LOPRESSOR) 50 MG tablet TAKE 1 TABLET BY MOUTH  TWICE DAILY 11/02/19  Yes Burns, Claudina Lick, MD  Benzethonium Chloride (ANTI-BACTERIAL CLEANSING WIPES) 0.18 % MISC Apply Topically. 08/14/17   Binnie Rail, MD  Ostomy Supplies (CLOSED-END COLOSTOMY [facility]) MISC Use as directed 08/14/17   Binnie Rail, MD  Ostomy Supplies (ODOR SPRAY) LIQD uad 08/14/17   Binnie Rail, MD    Allergies    Amlodipine  Review of Systems   Review of Systems  Constitutional: Negative for appetite change and fatigue.  HENT: Negative for congestion, ear discharge  and sinus pressure.   Eyes: Negative for discharge.  Respiratory: Negative for cough.   Cardiovascular: Negative for chest pain.  Gastrointestinal: Positive for abdominal pain and diarrhea. Negative for anorexia.       Diarrhea  Genitourinary: Negative for frequency and hematuria.  Musculoskeletal: Negative for back pain.  Skin: Negative for rash.  Neurological: Positive for weakness. Negative for seizures and headaches.  Psychiatric/Behavioral: Negative for hallucinations.    Physical Exam Updated Vital Signs BP 131/63   Pulse (!) 51   Temp 98.1 F (36.7 C)   Resp 17   SpO2 98%   Physical Exam Vitals and nursing note reviewed.  Constitutional:  Appearance: She is well-developed.  HENT:     Head: Normocephalic.     Nose: Nose normal.  Eyes:     General: No scleral icterus.    Conjunctiva/sclera: Conjunctivae normal.  Neck:     Thyroid: No thyromegaly.  Cardiovascular:     Rate and Rhythm: Normal rate and regular rhythm.     Heart sounds: No murmur. No friction rub. No gallop.   Pulmonary:     Breath sounds: No stridor. No wheezing or rales.  Chest:     Chest wall: No tenderness.  Abdominal:     General: There is no distension.     Tenderness: There is no abdominal tenderness. There is no rebound.  Musculoskeletal:        General: Normal range of motion.     Cervical back: Neck supple.  Lymphadenopathy:     Cervical: No cervical adenopathy.  Skin:    Findings: No erythema or rash.  Neurological:     Mental Status: She is alert.     Motor: No abnormal muscle tone.     Coordination: Coordination normal.     Comments: Patient alert  Psychiatric:        Behavior: Behavior normal.     ED Results / Procedures / Treatments   Labs (all labs ordered are listed, but only abnormal results are displayed) Labs Reviewed  BASIC METABOLIC PANEL - Abnormal; Notable for the following components:      Result Value   CO2 21 (*)    Glucose, Bld 105 (*)    BUN 46 (*)     Creatinine, Ser 2.79 (*)    GFR calc non Af Amer 15 (*)    GFR calc Af Amer 17 (*)    All other components within normal limits  CBC - Abnormal; Notable for the following components:   WBC 11.3 (*)    Hemoglobin 15.3 (*)    All other components within normal limits  LIPASE, BLOOD - Abnormal; Notable for the following components:   Lipase 106 (*)    All other components within normal limits  HEPATIC FUNCTION PANEL - Abnormal; Notable for the following components:   Alkaline Phosphatase 144 (*)    All other components within normal limits  SARS CORONAVIRUS 2 (TAT 6-24 HRS)  URINALYSIS, ROUTINE W REFLEX MICROSCOPIC    EKG None  Radiology CT ABDOMEN PELVIS WO CONTRAST  Result Date: 12/17/2019 CLINICAL DATA:  Abdominal pain. EXAM: CT ABDOMEN AND PELVIS WITHOUT CONTRAST TECHNIQUE: Multidetector CT imaging of the abdomen and pelvis was performed following the standard protocol without IV contrast. COMPARISON:  August 07, 2016 FINDINGS: Lower chest: Mild atelectasis is seen in the posterior aspect right lung base. Hepatobiliary: A stable 11.9 cm x 10.7 cm cyst is seen within the right lobe of the liver. Additional smaller cysts are seen scattered throughout the liver parenchyma. Surgical clips are seen in the gallbladder fossa. Pancreas: Unremarkable. No pancreatic ductal dilatation or surrounding inflammatory changes. Spleen: Normal in size without focal abnormality. Adrenals/Urinary Tract: Adrenal glands are unremarkable. Kidneys are normal in size, without renal calculi or hydronephrosis. A stable 2.2 cm x 2.4 cm partially calcified low-attenuation mass is seen in the anterior aspect of the mid right kidney. Multiple stable bilateral renal cysts are seen. Bladder is unremarkable. Stomach/Bowel: There is a right lower quadrant ostomy site. There is a prior hand. Appendix appears normal. No evidence of bowel wall thickening, distention, or inflammatory changes. Vascular/Lymphatic: Marked severity  aortic atherosclerosis. No enlarged  abdominal or pelvic lymph nodes. Reproductive: Status post hysterectomy. No adnexal masses. Other: No abdominal wall hernia or abnormality. No abdominopelvic ascites. Musculoskeletal: Multilevel degenerative changes seen throughout the lumbar spine. IMPRESSION: 1. Numerous large bilateral hepatic and renal cysts. 2. Evidence of prior cholecystectomy. 3. Small hiatal hernia. 4. Stable partially calcified low-attenuation renal mass. 5. Postop changes with a subsequent left lower quadrant ostomy site. Electronically Signed   By: Virgina Norfolk M.D.   On: 12/17/2019 18:27    Procedures Procedures (including critical care time)  Medications Ordered in ED Medications  sodium chloride flush (NS) 0.9 % injection 3 mL (has no administration in time range)  sodium chloride 0.9 % bolus 1,000 mL (1,000 mLs Intravenous New Bag/Given 12/17/19 1636)    ED Course  I have reviewed the triage vital signs and the nursing notes.  Pertinent labs & imaging results that were available during my care of the patient were reviewed by me and considered in my medical decision making (see chart for details).    MDM Rules/Calculators/A&P                      CT scan unremarkable.  Labs show patient have an AKI.  She will be admitted to medicine for hydration Final Clinical Impression(s) / ED Diagnoses Final diagnoses:  AKI (acute kidney injury) [facility])    Rx / [place] Orders ED Discharge Orders    None       Milton Ferguson, MD 12/17/19 1914

## 2019-12-18 LAB — BASIC METABOLIC PANEL
Anion gap: 12 (ref 5–15)
BUN: 43 mg/dL — ABNORMAL HIGH (ref 8–23)
CO2: 16 mmol/L — ABNORMAL LOW (ref 22–32)
Calcium: 8.5 mg/dL — ABNORMAL LOW (ref 8.9–10.3)
Chloride: 106 mmol/L (ref 98–111)
Creatinine, Ser: 2.17 mg/dL — ABNORMAL HIGH (ref 0.44–1.00)
GFR calc Af Amer: 24 mL/min — ABNORMAL LOW (ref >60)
GFR calc non Af Amer: 20 mL/min — ABNORMAL LOW (ref >60)
Glucose, Bld: 89 mg/dL (ref 70–99)
Potassium: 2.8 mmol/L — ABNORMAL LOW (ref 3.5–5.1)
Sodium: 134 mmol/L — ABNORMAL LOW (ref 135–145)

## 2019-12-18 LAB — LIPASE, BLOOD: Lipase: 191 U/L — ABNORMAL HIGH (ref 11–51)

## 2019-12-18 LAB — LACTOFERRIN, FECAL, QUALITATIVE: Lactoferrin, Fecal, Qual: NEGATIVE

## 2019-12-18 LAB — SARS CORONAVIRUS 2 (TAT 6-24 HRS): SARS Coronavirus 2: NEGATIVE

## 2019-12-18 MED ORDER — POTASSIUM CHLORIDE 10 MEQ/100ML IV SOLN
10.0000 meq | INTRAVENOUS | Status: AC
  Administered 2019-12-18 (×2): 10 meq via INTRAVENOUS
  Filled 2019-12-18 (×2): qty 100

## 2019-12-18 MED ORDER — ENSURE ENLIVE PO LIQD
237.0000 mL | Freq: Three times a day (TID) | ORAL | Status: DC
  Administered 2019-12-18 – 2019-12-22 (×6): 237 mL via ORAL

## 2019-12-18 MED ORDER — SODIUM CHLORIDE 0.9 % IV SOLN
1.0000 g | INTRAVENOUS | Status: DC
  Administered 2019-12-18 – 2019-12-20 (×3): 1 g via INTRAVENOUS
  Filled 2019-12-18 (×3): qty 1

## 2019-12-18 MED ORDER — ENSURE ENLIVE PO LIQD
237.0000 mL | Freq: Two times a day (BID) | ORAL | Status: DC
  Administered 2019-12-18: 10:00:00 237 mL via ORAL

## 2019-12-18 MED ORDER — POTASSIUM CHLORIDE 20 MEQ PO PACK
40.0000 meq | PACK | Freq: Once | ORAL | Status: AC
  Administered 2019-12-18: 15:00:00 40 meq via ORAL
  Filled 2019-12-18: qty 2

## 2019-12-18 MED ORDER — ADULT MULTIVITAMIN W/MINERALS CH
1.0000 | ORAL_TABLET | Freq: Every day | ORAL | Status: DC
  Administered 2019-12-18 – 2019-12-22 (×5): 1 via ORAL
  Filled 2019-12-18 (×5): qty 1

## 2019-12-18 MED ORDER — POTASSIUM CHLORIDE 20 MEQ PO PACK
40.0000 meq | PACK | Freq: Once | ORAL | Status: AC
  Administered 2019-12-18: 40 meq via ORAL
  Filled 2019-12-18: qty 2

## 2019-12-18 MED ORDER — SODIUM BICARBONATE 650 MG PO TABS
650.0000 mg | ORAL_TABLET | Freq: Two times a day (BID) | ORAL | Status: DC
  Administered 2019-12-18 – 2019-12-20 (×5): 650 mg via ORAL
  Filled 2019-12-18 (×5): qty 1

## 2019-12-18 NOTE — Telephone Encounter (Signed)
Looks like patient did go to the ED in regards.

## 2019-12-18 NOTE — Progress Notes (Signed)
Initial Nutrition Assessment  DOCUMENTATION CODES:   Not applicable  INTERVENTION:  - will order Magic Cup BID with meals, each supplement provides 290 kcal and 9 grams of protein. - will increase Ensure Enlive from BID to TID, each supplement provides 350 kcal and 20 grams of protein. - will order daily multivitamin with minerals. - weigh patient today.    NUTRITION DIAGNOSIS:   Inadequate oral intake related to acute illness, decreased appetite, lethargy/confusion as evidenced by per patient/family report(caregiver's report).  GOAL:   Patient will meet greater than or equal to 90% of their needs  MONITOR:   PO intake, Supplement acceptance, Labs, Weight trends  REASON FOR ASSESSMENT:   Malnutrition Screening Tool  ASSESSMENT:   86 year old female with past medical history of diverticulosis, s/p partial colectomy (patient has a colostomy bag), C. difficile colitis, stage 3 CKD, HTN, hyperlipidemia, grade 1 diastolic dysfunction, colonic polyp, and dementia. According to the patient's caregiver, patient has been having watery stool over the last 3-4 days without associated N/V, no fever or chills, no sick contacts, and no abdominal pain. Patient has had poor p.o. intake and has barely made any urine in the 24 hours PTA.  CT abdomen revealed numerous large bilateral hepatic and renal cysts, stable partially calcified renal mass with low attenuation and severe aortic atherosclerosis. No pancreatic abnormality noted.  Patient is a/o to self only and unable to provide any information. No intakes documented since admission. Per review of [facility], patient skipped both breakfast and lunch so far today. The last weight recorded in the chart was on 04/14/19 when she weighed 210 lb. Weight at that time was stable from the previous May.   Per notes: - AKI on CKD stage 3 - watery diarrhea--no clear etiology; CT abd/pelvis did not show colitis or diverticulitis; GI panel planned - volume  depletion--IV fluid ordered - elevated lipase--no other signs/symptoms of pancreatitis   Labs reviewed; Na: 134 mmol/l, K: 2.8 mmol/l, BUN: 43 mg/dl, creatinine: 2.17 mg/dl, Ca: 8.5 mg/dl, lipase: 191 u/l (trending up), GFR: 20 ml/min. Medications reviewed; 40 mEq Klor-Con x1 dose 1/8, 650 mg oral sodium bicarb BID, 500 mcg oral cyanocobalamin/day.  IVF; NS @ 100 ml/hr.     NUTRITION - FOCUSED PHYSICAL EXAM:  unable to complete at this time.   Diet Order:   Diet Order            Diet Heart Room service appropriate? Yes; Fluid consistency: Thin  Diet effective now              EDUCATION NEEDS:   No education needs have been identified at this time  Skin:  Skin Assessment: Reviewed RN Assessment  Last BM:  1/8  Height:   Ht Readings from Last 1 Encounters:  11/25/19 5\' 10"  (D34-534 m)    Weight:   Wt Readings from Last 1 Encounters:  04/14/19 95.3 kg    Ideal Body Weight:  68.2 kg  BMI:  There is no height or weight on file to calculate BMI.  Estimated Nutritional Needs:   Kcal:  1700-1900 kcal  Protein:  70-80 grams  Fluid:  >/= 2.2 L/day     Jarome Matin, MS, RD, LDN, [facility] Inpatient Clinical Dietitian Pager # [phone] After hours/weekend pager # [phone]

## 2019-12-18 NOTE — Progress Notes (Signed)
PROGRESS NOTE    EDID FUSTER  [id number] DOB: 04-Mar-1934 DOA: 12/17/2019   PCP: Binnie Rail, MD   Brief Narrative: Patient is an 86 year old Caucasian female with past medical history significant for diverticulosis, status post partial colectomy (patient has a colostomy bag), C. difficile colitis, chronic kidney disease stage III, hypertension, hyperlipidemia, grade 1 diastolic dysfunction, colonic polyp and dementia.  Patient could not or will not give any significant history.  Most of the limited history came from the patient's caregiver.  According to the patient's caregiver, patient has been having watery stool over the last 3 to 4 days.  No associated nausea vomiting, no fever chills, no sick contacts and no abdominal pain.  Patient has had poor p.o. intake, and has barely made any urine in the last 24 hours.  On presentation to the hospital, leukocytosis of 11,300 was noted, serum creatinine of 2.79 (up from 1.17 from around September 2020).  Covid testing is negative. CT scan of the abdomen done revealed numerous large bilateral hepatic and renal cyst, stable partially calcified renal mass with low attenuation and severe aortic atherosclerosis.  No pancreatic abnormality noted.  Lipase of 106 was documented.  No headache, no neck pain, no URI symptoms, no chest pain, no shortness of breath.  He renal functions improving,   Assessment & Plan:   Active Problems:   AKI (acute kidney injury) ([place])  Acute kidney injury on chronic kidney disease stage IIIa: -Suspect acute kidney injury is likely prerenal. -We will hydrate patient -Check urine sodium, urine protein and creatinine. -CT abdomen also revealed renal and hepatic cyst, but no prior documentation of polycystic kidney disease. -We will monitor renal function and electrolytes with adequate hydration. -Further management depend on hospital course.  Watery diarrhea: -Etiology unclear. -CT abdomen and pelvis without  contrast did not reveal colitis or diverticulitis, however, this is a noncontrast study. -Follow Covid testing to rule out possible Covid infection presenting with GI symptoms. -Stool fecal lactoferrin -Check GI panel -Supportive treatment for now.  Volume depletion: -Secondary to above -Continue with hydration.  Elevated lipase: -No other symptoms of pancreatitis reported. -Repeat level in the morning. -Further management will depend on hospital course.  Hypokalemia : Replacement done., Recheck labs.  DVT prophylaxis: Subcutaneous heparin Code Status: Full code.  Readdress in the morning Family Communication: Caregiver Disposition Plan: This will depend on hospital course Consults called: None Disposition Inpatient.   Consultants:   None  Procedures: None Antimicrobials:  Anti-infectives (From admission, onward)   None     Subjective: Patient is seen and examined at bedside,  she is lying comfortably but complains about pain in the left IV line.  Objective: Vitals:   12/17/19 2200 12/17/19 2236 12/18/19 0551 12/18/19 1023  BP: 128/62 (!) 150/70 (!) 142/64 138/65  Pulse: (!) 53 (!) 55 (!) 53 62  Resp: 16 17 17    Temp:  98.3 F (36.8 C) 98 F (36.7 C)   TempSrc:  Oral Oral   SpO2: 98% 100% 96%     Intake/Output Summary (Last 24 hours) at 12/18/2019 1441 Last data filed at 12/18/2019 1020 Gross per 24 hour  Intake 1414.49 ml  Output 1175 ml  Net 239.49 ml   There were no vitals filed for this visit.  Examination:  General exam: Appears calm and comfortable  Respiratory system: Clear to auscultation. Respiratory effort normal. Cardiovascular system: S1 & S2 heard, RRR. No JVD, murmurs, rubs, gallops or clicks. No pedal edema. Gastrointestinal system: Abdomen is  nondistended, soft and nontender. No organomegaly or masses felt. Normal bowel sounds heard. Central nervous system: Alert and oriented. No focal neurological deficits. Extremities:  No edema, no  bruising, no swelling Skin: No rashes, lesions or ulcers Psychiatry: Judgement and insight appear normal. Mood & affect appropriate.     Data Reviewed: I have personally reviewed following labs and imaging studies  CBC: Recent Labs  Lab 12/17/19 1635 12/17/19 2156  WBC 11.3* 10.5  HGB 15.3* 14.0  HCT 44.8 41.3  MCV 89.2 89.0  PLT 366 [id number]   Basic Metabolic Panel: Recent Labs  Lab 12/17/19 1635 12/17/19 2025 12/18/19 0435  NA 135  --  134*  K 3.5  --  2.8*  CL 101  --  106  CO2 21*  --  16*  GLUCOSE 105*  --  89  BUN 46*  --  43*  CREATININE 2.79* 2.78* 2.17*  CALCIUM 9.6  --  8.5*  MG  --  2.4  --   PHOS  --  5.1*  --    GFR: CrCl cannot be calculated (Unknown ideal weight.). Liver Function Tests: Recent Labs  Lab 12/17/19 1636  AST 17  ALT 19  ALKPHOS 144*  BILITOT 1.0  PROT 7.7  ALBUMIN 4.5   Recent Labs  Lab 12/17/19 1635 12/18/19 0435  LIPASE 106* 191*   No results for input(s): AMMONIA in the last 168 hours. Coagulation Profile: No results for input(s): INR, PROTIME in the last 168 hours. Cardiac Enzymes: No results for input(s): CKTOTAL, CKMB, CKMBINDEX, TROPONINI in the last 168 hours. BNP (last 3 results) No results for input(s): PROBNP in the last 8760 hours. HbA1C: No results for input(s): HGBA1C in the last 72 hours. CBG: No results for input(s): GLUCAP in the last 168 hours. Lipid Profile: No results for input(s): CHOL, HDL, LDLCALC, TRIG, CHOLHDL, LDLDIRECT in the last 72 hours. Thyroid Function Tests: Recent Labs    12/17/19 1635  TSH 1.436   Anemia Panel: No results for input(s): VITAMINB12, FOLATE, FERRITIN, TIBC, IRON, RETICCTPCT in the last 72 hours. Sepsis Labs: Recent Labs  Lab 12/17/19 2156  LATICACIDVEN 1.3    Recent Results (from the past 240 hour(s))  SARS CORONAVIRUS 2 (TAT 6-24 HRS) Nasopharyngeal Nasopharyngeal Swab     Status: None   Collection Time: 12/17/19  7:24 PM   Specimen: Nasopharyngeal Swab    Result Value Ref Range Status   SARS Coronavirus 2 NEGATIVE NEGATIVE Final    Comment: (NOTE) SARS-CoV-2 target nucleic acids are NOT DETECTED. The SARS-CoV-2 RNA is generally detectable in upper and lower respiratory specimens during the acute phase of infection. Negative results do not preclude SARS-CoV-2 infection, do not rule out co-infections with other pathogens, and should not be used as the sole basis for treatment or other patient management decisions. Negative results must be combined with clinical observations, patient history, and epidemiological information. The expected result is Negative. Fact Sheet for Patients: SugarRoll.be Fact Sheet for Healthcare Providers: https://www.woods-mathews.com/ This test is not yet approved or cleared by the [place] FDA and  has been authorized for detection and/or diagnosis of SARS-CoV-2 by FDA under an Emergency Use Authorization (EUA). This EUA will remain  in effect (meaning this test can be used) for the duration of the COVID-19 declaration under Section 56 4(b)(1) of the Act, 21 U.S.C. section 360bbb-3(b)(1), unless the authorization is terminated or revoked sooner. Performed at [facility], [address] [place], [place] [address]  Radiology Studies: CT ABDOMEN PELVIS WO CONTRAST  Result Date: 12/17/2019 CLINICAL DATA:  Abdominal pain. EXAM: CT ABDOMEN AND PELVIS WITHOUT CONTRAST TECHNIQUE: Multidetector CT imaging of the abdomen and pelvis was performed following the standard protocol without IV contrast. COMPARISON:  August 07, 2016 FINDINGS: Lower chest: Mild atelectasis is seen in the posterior aspect right lung base. Hepatobiliary: A stable 11.9 cm x 10.7 cm cyst is seen within the right lobe of the liver. Additional smaller cysts are seen scattered throughout the liver parenchyma. Surgical clips are seen in the gallbladder fossa. Pancreas: Unremarkable. No pancreatic  ductal dilatation or surrounding inflammatory changes. Spleen: Normal in size without focal abnormality. Adrenals/Urinary Tract: Adrenal glands are unremarkable. Kidneys are normal in size, without renal calculi or hydronephrosis. A stable 2.2 cm x 2.4 cm partially calcified low-attenuation mass is seen in the anterior aspect of the mid right kidney. Multiple stable bilateral renal cysts are seen. Bladder is unremarkable. Stomach/Bowel: There is a right lower quadrant ostomy site. There is a prior hand. Appendix appears normal. No evidence of bowel wall thickening, distention, or inflammatory changes. Vascular/Lymphatic: Marked severity aortic atherosclerosis. No enlarged abdominal or pelvic lymph nodes. Reproductive: Status post hysterectomy. No adnexal masses. Other: No abdominal wall hernia or abnormality. No abdominopelvic ascites. Musculoskeletal: Multilevel degenerative changes seen throughout the lumbar spine. IMPRESSION: 1. Numerous large bilateral hepatic and renal cysts. 2. Evidence of prior cholecystectomy. 3. Small hiatal hernia. 4. Stable partially calcified low-attenuation renal mass. 5. Postop changes with a subsequent left lower quadrant ostomy site. Electronically Signed   By: Virgina Norfolk M.D.   On: 12/17/2019 18:27   Scheduled Meds: . atorvastatin  20 mg Oral QHS  . buPROPion  150 mg Oral Daily  . clopidogrel  75 mg Oral Daily  . donepezil  10 mg Oral QHS  . escitalopram  10 mg Oral Daily  . feeding supplement (ENSURE ENLIVE)  237 mL Oral TID BM  . heparin  5,000 Units Subcutaneous Q8H  . isosorbide mononitrate  60 mg Oral Daily  . memantine  10 mg Oral Daily  . metoprolol tartrate  50 mg Oral BID  . multivitamin with minerals  1 tablet Oral Daily  . potassium chloride  40 mEq Oral Once  . sodium bicarbonate  650 mg Oral BID  . sodium chloride flush  3 mL Intravenous Once  . vitamin B-12  500 mcg Oral Daily   Continuous Infusions: . sodium chloride 100 mL/hr at 12/18/19  0300     LOS: 1 day    Time spent: 25 mins    Mosiah Bastin, MD [facility]   If 7PM-7AM, please contact night-coverage

## 2019-12-19 LAB — CBC
HCT: 37.6 % (ref 36.0–46.0)
Hemoglobin: 12.7 g/dL (ref 12.0–15.0)
MCH: 31.1 pg (ref 26.0–34.0)
MCHC: 33.8 g/dL (ref 30.0–36.0)
MCV: 91.9 fL (ref 80.0–100.0)
Platelets: 274 10*3/uL (ref 150–400)
RBC: 4.09 MIL/uL (ref 3.87–5.11)
RDW: 13.7 % (ref 11.5–15.5)
WBC: 7.9 10*3/uL (ref 4.0–10.5)
nRBC: 0 % (ref 0.0–0.2)

## 2019-12-19 LAB — COMPREHENSIVE METABOLIC PANEL
ALT: 13 U/L (ref 0–44)
AST: 12 U/L — ABNORMAL LOW (ref 15–41)
Albumin: 3.4 g/dL — ABNORMAL LOW (ref 3.5–5.0)
Alkaline Phosphatase: 103 U/L (ref 38–126)
Anion gap: 8 (ref 5–15)
BUN: 36 mg/dL — ABNORMAL HIGH (ref 8–23)
CO2: 16 mmol/L — ABNORMAL LOW (ref 22–32)
Calcium: 8.5 mg/dL — ABNORMAL LOW (ref 8.9–10.3)
Chloride: 114 mmol/L — ABNORMAL HIGH (ref 98–111)
Creatinine, Ser: 1.67 mg/dL — ABNORMAL HIGH (ref 0.44–1.00)
GFR calc Af Amer: 32 mL/min — ABNORMAL LOW (ref >60)
GFR calc non Af Amer: 28 mL/min — ABNORMAL LOW (ref >60)
Glucose, Bld: 96 mg/dL (ref 70–99)
Potassium: 3.8 mmol/L (ref 3.5–5.1)
Sodium: 138 mmol/L (ref 135–145)
Total Bilirubin: 0.6 mg/dL (ref 0.3–1.2)
Total Protein: 5.9 g/dL — ABNORMAL LOW (ref 6.5–8.1)

## 2019-12-19 LAB — MAGNESIUM: Magnesium: 2.1 mg/dL (ref 1.7–2.4)

## 2019-12-19 LAB — PHOSPHORUS: Phosphorus: 2.8 mg/dL (ref 2.5–4.6)

## 2019-12-19 NOTE — Progress Notes (Signed)
PROGRESS NOTE    BONA RAVI  [id number] DOB: 03/31/1934 DOA: 12/17/2019   PCP: Binnie Rail, MD   Brief Narrative: Patient is an 86 year old Caucasian female with past medical history significant for diverticulosis, status post partial colectomy (patient has a colostomy bag), C. difficile colitis, chronic kidney disease stage III, hypertension, hyperlipidemia, grade 1 diastolic dysfunction, colonic polyp and dementia.  Patient could not or will not give any significant history.  Most of the limited history came from the patient's caregiver.  According to the patient's caregiver, patient has been having watery stool over the last 3 to 4 days.  No associated nausea vomiting, no fever chills, no sick contacts and no abdominal pain.  Patient has had poor p.o. intake, and has barely made any urine in the last 24 hours.  On presentation to the hospital, leukocytosis of 11,300 was noted, serum creatinine of 2.79 (up from 1.17 from around September 2020).  Covid testing is negative. CT scan of the abdomen done revealed numerous large bilateral hepatic and renal cyst, stable partially calcified renal mass with low attenuation and severe aortic atherosclerosis.  No pancreatic abnormality noted.  Lipase of 106 was documented.  No headache, no neck pain, no URI symptoms, no chest pain, no shortness of breath.  He renal functions improving,   Assessment & Plan:   Active Problems:   AKI (acute kidney injury) ([place])  Acute kidney injury on chronic kidney disease stage IIIa: - Improving -Suspect acute kidney injury is likely prerenal. -We will hydrate patient -Check urine sodium, urine protein and creatinine. -CT abdomen also revealed renal and hepatic cyst, but no prior documentation of polycystic kidney disease. -We will monitor renal function and electrolytes with adequate hydration. -Further management depend on hospital course.  Watery diarrhea: improving -Etiology unclear. -CT abdomen  and pelvis without contrast did not reveal colitis or diverticulitis, however, this is a noncontrast study. -COVID test negative -Stool fecal lactoferrin -Check GI panel -Supportive treatment for now.  Volume depletion: - Improving. -Secondary to above -Continue with hydration.  Elevated lipase: -No other symptoms of pancreatitis reported. -Repeat level in the morning. -Further management will depend on hospital course.  Hypokalemia : Replacement done., Recheck labs.  DVT prophylaxis: Subcutaneous heparin Code Status: Full code.  Readdress in the morning Family Communication: Caregiver Disposition Plan: This will depend on hospital course Consults called: None Disposition Inpatient.   Consultants:   None  Procedures: None Antimicrobials:  Anti-infectives (From admission, onward)   Start     Dose/Rate Route Frequency Ordered Stop   12/18/19 1600  cefTRIAXone (ROCEPHIN) 1 g in sodium chloride 0.9 % 100 mL IVPB     1 g 200 mL/hr over 30 Minutes Intravenous Every 24 hours 12/18/19 1445       Subjective: Patient is seen and examined at bedside,  she is lying comfortably , denies any abdominal pain and states she is feeling better.  Objective: Vitals:   12/18/19 1023 12/18/19 1800 12/18/19 2208 12/19/19 0632  BP: 138/65  127/68 127/61  Pulse: 62  62 (!) 59  Resp:   20 16  Temp:   98.7 F (37.1 C) 98.2 F (36.8 C)  TempSrc:   Oral Oral  SpO2:   97% 97%  Weight:  89.6 kg      Intake/Output Summary (Last 24 hours) at 12/19/2019 1317 Last data filed at 12/19/2019 1117 Gross per 24 hour  Intake 2137.33 ml  Output 1300 ml  Net 837.33 ml   [employer]  12/18/19 1800  Weight: 89.6 kg    Examination:  General exam: Appears calm and comfortable  Respiratory system: Clear to auscultation. Respiratory effort normal. Cardiovascular system: S1 & S2 heard, RRR. No JVD, murmurs, rubs, gallops or clicks. No pedal edema. Gastrointestinal system: Abdomen is  nondistended, soft and nontender. No organomegaly or masses felt. Normal bowel sounds heard. Central nervous system: Alert and oriented. No focal neurological deficits. Extremities:  No edema, no bruising, no swelling Skin: No rashes, lesions or ulcers Psychiatry: Judgement and insight appear normal. Mood & affect appropriate.   Data Reviewed: I have personally reviewed following labs and imaging studies  CBC: Recent Labs  Lab 12/17/19 1635 12/17/19 2156 12/19/19 0529  WBC 11.3* 10.5 7.9  HGB 15.3* 14.0 12.7  HCT 44.8 41.3 37.6  MCV 89.2 89.0 91.9  PLT 366 327 [id number]   Basic Metabolic Panel: Recent Labs  Lab 12/17/19 1635 12/17/19 2025 12/18/19 0435 12/19/19 0529  NA 135  --  134* 138  K 3.5  --  2.8* 3.8  CL 101  --  106 114*  CO2 21*  --  16* 16*  GLUCOSE 105*  --  89 96  BUN 46*  --  43* 36*  CREATININE 2.79* 2.78* 2.17* 1.67*  CALCIUM 9.6  --  8.5* 8.5*  MG  --  2.4  --  2.1  PHOS  --  5.1*  --  2.8   GFR: Estimated Creatinine Clearance: 31 mL/min (A) (by C-G formula based on SCr of 1.67 mg/dL (H)). Liver Function Tests: Recent Labs  Lab 12/17/19 1636 12/19/19 0529  AST 17 12*  ALT 19 13  ALKPHOS 144* 103  BILITOT 1.0 0.6  PROT 7.7 5.9*  ALBUMIN 4.5 3.4*   Recent Labs  Lab 12/17/19 1635 12/18/19 0435  LIPASE 106* 191*   No results for input(s): AMMONIA in the last 168 hours. Coagulation Profile: No results for input(s): INR, PROTIME in the last 168 hours. Cardiac Enzymes: No results for input(s): CKTOTAL, CKMB, CKMBINDEX, TROPONINI in the last 168 hours. BNP (last 3 results) No results for input(s): PROBNP in the last 8760 hours. HbA1C: No results for input(s): HGBA1C in the last 72 hours. CBG: No results for input(s): GLUCAP in the last 168 hours. Lipid Profile: No results for input(s): CHOL, HDL, LDLCALC, TRIG, CHOLHDL, LDLDIRECT in the last 72 hours. Thyroid Function Tests: Recent Labs    12/17/19 1635  TSH 1.436   Anemia Panel: No  results for input(s): VITAMINB12, FOLATE, FERRITIN, TIBC, IRON, RETICCTPCT in the last 72 hours. Sepsis Labs: Recent Labs  Lab 12/17/19 2156  LATICACIDVEN 1.3    Recent Results (from the past 240 hour(s))  SARS CORONAVIRUS 2 (TAT 6-24 HRS) Nasopharyngeal Nasopharyngeal Swab     Status: None   Collection Time: 12/17/19  7:24 PM   Specimen: Nasopharyngeal Swab  Result Value Ref Range Status   SARS Coronavirus 2 NEGATIVE NEGATIVE Final    Comment: (NOTE) SARS-CoV-2 target nucleic acids are NOT DETECTED. The SARS-CoV-2 RNA is generally detectable in upper and lower respiratory specimens during the acute phase of infection. Negative results do not preclude SARS-CoV-2 infection, do not rule out co-infections with other pathogens, and should not be used as the sole basis for treatment or other patient management decisions. Negative results must be combined with clinical observations, patient history, and epidemiological information. The expected result is Negative. Fact Sheet for Patients: SugarRoll.be Fact Sheet for Healthcare Providers: https://www.woods-mathews.com/ This test is not yet approved or cleared  by the [place] and  has been authorized for detection and/or diagnosis of SARS-CoV-2 by FDA under an Emergency Use Authorization (EUA). This EUA will remain  in effect (meaning this test can be used) for the duration of the COVID-19 declaration under Section 56 4(b)(1) of the Act, 21 U.S.C. section 360bbb-3(b)(1), unless the authorization is terminated or revoked sooner. Performed at [facility], [place] [address], [place], [place] [address]      Radiology Studies: CT ABDOMEN PELVIS WO CONTRAST  Result Date: 12/17/2019 CLINICAL DATA:  Abdominal pain. EXAM: CT ABDOMEN AND PELVIS WITHOUT CONTRAST TECHNIQUE: Multidetector CT imaging of the abdomen and pelvis was performed following the standard protocol without IV contrast.  COMPARISON:  August 07, 2016 FINDINGS: Lower chest: Mild atelectasis is seen in the posterior aspect right lung base. Hepatobiliary: A stable 11.9 cm x 10.7 cm cyst is seen within the right lobe of the liver. Additional smaller cysts are seen scattered throughout the liver parenchyma. Surgical clips are seen in the gallbladder fossa. Pancreas: Unremarkable. No pancreatic ductal dilatation or surrounding inflammatory changes. Spleen: Normal in size without focal abnormality. Adrenals/Urinary Tract: Adrenal glands are unremarkable. Kidneys are normal in size, without renal calculi or hydronephrosis. A stable 2.2 cm x 2.4 cm partially calcified low-attenuation mass is seen in the anterior aspect of the mid right kidney. Multiple stable bilateral renal cysts are seen. Bladder is unremarkable. Stomach/Bowel: There is a right lower quadrant ostomy site. There is a prior hand. Appendix appears normal. No evidence of bowel wall thickening, distention, or inflammatory changes. Vascular/Lymphatic: Marked severity aortic atherosclerosis. No enlarged abdominal or pelvic lymph nodes. Reproductive: Status post hysterectomy. No adnexal masses. Other: No abdominal wall hernia or abnormality. No abdominopelvic ascites. Musculoskeletal: Multilevel degenerative changes seen throughout the lumbar spine. IMPRESSION: 1. Numerous large bilateral hepatic and renal cysts. 2. Evidence of prior cholecystectomy. 3. Small hiatal hernia. 4. Stable partially calcified low-attenuation renal mass. 5. Postop changes with a subsequent left lower quadrant ostomy site. Electronically Signed   By: Virgina Norfolk M.D.   On: 12/17/2019 18:27   Scheduled Meds: . atorvastatin  20 mg Oral QHS  . buPROPion  150 mg Oral Daily  . clopidogrel  75 mg Oral Daily  . donepezil  10 mg Oral QHS  . escitalopram  10 mg Oral Daily  . feeding supplement (ENSURE ENLIVE)  237 mL Oral TID BM  . heparin  5,000 Units Subcutaneous Q8H  . isosorbide mononitrate  60  mg Oral Daily  . memantine  10 mg Oral Daily  . metoprolol tartrate  50 mg Oral BID  . multivitamin with minerals  1 tablet Oral Daily  . sodium bicarbonate  650 mg Oral BID  . sodium chloride flush  3 mL Intravenous Once  . vitamin B-12  500 mcg Oral Daily   Continuous Infusions: . sodium chloride 100 mL/hr at 12/19/19 0510  . cefTRIAXone (ROCEPHIN)  IV 1 g (12/18/19 1745)     LOS: 2 days    Time spent: 25 mins    Dameir Gentzler, MD [facility]   If 7PM-7AM, please contact night-coverage

## 2019-12-20 LAB — BASIC METABOLIC PANEL
Anion gap: 7 (ref 5–15)
BUN: 23 mg/dL (ref 8–23)
CO2: 17 mmol/L — ABNORMAL LOW (ref 22–32)
Calcium: 8.1 mg/dL — ABNORMAL LOW (ref 8.9–10.3)
Chloride: 117 mmol/L — ABNORMAL HIGH (ref 98–111)
Creatinine, Ser: 1.34 mg/dL — ABNORMAL HIGH (ref 0.44–1.00)
GFR calc Af Amer: 42 mL/min — ABNORMAL LOW (ref >60)
GFR calc non Af Amer: 37 mL/min — ABNORMAL LOW (ref >60)
Glucose, Bld: 95 mg/dL (ref 70–99)
Potassium: 3.6 mmol/L (ref 3.5–5.1)
Sodium: 141 mmol/L (ref 135–145)

## 2019-12-20 MED ORDER — ORAL CARE MOUTH RINSE
15.0000 mL | Freq: Two times a day (BID) | OROMUCOSAL | Status: DC
  Administered 2019-12-20 – 2019-12-22 (×3): 15 mL via OROMUCOSAL

## 2019-12-20 NOTE — Progress Notes (Signed)
Patient was admitted 12/17/2019. I noticed admission history has still not been completed. Patient is only alert to self and unable to answer these questions. Questions will need to be answered by husband when he comes to visit again in am. Toni [name]

## 2019-12-20 NOTE — Progress Notes (Signed)
PROGRESS NOTE    Toni [name]  [id number] DOB: [birth year] DOA: 12/17/2019   PCP: Binnie Rail, MD   Brief Narrative: Patient is an 86 year old Caucasian female with past medical history significant for diverticulosis, status post partial colectomy (patient has a colostomy bag), C. difficile colitis, chronic kidney disease stage III, hypertension, hyperlipidemia, grade 1 diastolic dysfunction, colonic polyp and dementia.  Patient could not or will not give any significant history.  Most of the limited history came from the patient's caregiver.  According to the patient's caregiver, patient has been having watery stool over the last 3 to 4 days.  No associated nausea vomiting, no fever chills, no sick contacts and no abdominal pain.  Patient has had poor p.o. intake, and has barely made any urine in the last 24 hours.  On presentation to the hospital, leukocytosis of 11,300 was noted, serum creatinine of 2.79 (up from 1.17 from around September 2020).  Covid testing is negative. CT scan of the abdomen done revealed numerous large bilateral hepatic and renal cyst, stable partially calcified renal mass with low attenuation and severe aortic atherosclerosis.  No pancreatic abnormality noted.  Lipase of 106 was documented.  No headache, no neck pain, no URI symptoms, no chest pain, no shortness of breath.  He renal functions improving,   Assessment & Plan:   Active Problems:   AKI (acute kidney injury) ([place])  Acute kidney injury on chronic kidney disease stage IIIa: - Improving -Suspect acute kidney injury is likely prerenal. -continue IV hydration. Renal Fx improving -Check urine sodium, urine protein and creatinine. -CT abdomen also revealed renal and hepatic cyst, but no prior documentation of polycystic kidney disease. -We will monitor renal function and electrolytes with adequate hydration.  Watery diarrhea: improving -Etiology unclear. -CT abdomen and pelvis without contrast did  not reveal colitis or diverticulitis, however, this is a noncontrast study. -COVID test negative -Stool fecal lactoferrin -Check GI panel  pending -Supportive treatment for now.  Volume depletion: - Improving. -Secondary to above -Continue with hydration.  Elevated lipase: -No other symptoms of pancreatitis reported. -Repeat level in the morning. -Further management will depend on hospital course.  Hypokalemia : Replacement done., Recheck labs.  UTI: Continue Ceftriaxone,  Can discontinue tomorrow.  DVT prophylaxis: Subcutaneous heparin Code Status: Full code.  Readdress in the morning Family Communication: Caregiver Disposition Plan: This will depend on hospital course Consults called: None Disposition  PT evaluation and anticipated SNF   Consultants:   None  Procedures: None Antimicrobials:  Anti-infectives (From admission, onward)   Start     Dose/Rate Route Frequency Ordered Stop   12/18/19 1600  cefTRIAXone (ROCEPHIN) 1 g in sodium chloride 0.9 % 100 mL IVPB     1 g 200 mL/hr over 30 Minutes Intravenous Every 24 hours 12/18/19 1445       Subjective: Patient is seen and examined at bedside,  she is lying comfortably , denies any abdominal pain and states she is feeling better.  Objective: Vitals:   12/19/19 1404 12/19/19 2136 12/20/19 0523 12/20/19 1406  BP: (!) 127/58 (!) 146/76 127/60 130/74  Pulse: 70 66 (!) 56 (!) 56  Resp: 17 16 15 17   Temp: 98.2 F (36.8 C) 97.7 F (36.5 C) 98.8 F (37.1 C)   TempSrc: Oral Oral Oral   SpO2: 95% 94% 98% 97%  Weight:        Intake/Output Summary (Last 24 hours) at 12/20/2019 1443 Last data filed at 12/20/2019 1348 Gross per 24 hour  Intake 2590.3 ml  Output 700 ml  Net 1890.3 ml   [employer]   12/18/19 1800  Weight: 89.6 kg    Examination:  General exam: Appears calm and comfortable  Respiratory system: Clear to auscultation. Respiratory effort normal. Cardiovascular system: S1 & S2 heard, RRR. No  JVD, murmurs, rubs, gallops or clicks. No pedal edema. Gastrointestinal system: Abdomen is nondistended, soft and nontender. No organomegaly or masses felt. Normal bowel sounds heard. Central nervous system: Alert and oriented. No focal neurological deficits. Extremities:  No edema, no bruising, no swelling Skin: No rashes, lesions or ulcers Psychiatry: Judgement and insight appear normal. Mood & affect appropriate.   Data Reviewed: I have personally reviewed following labs and imaging studies  CBC: Recent Labs  Lab 12/17/19 1635 12/17/19 2156 12/19/19 0529  WBC 11.3* 10.5 7.9  HGB 15.3* 14.0 12.7  HCT 44.8 41.3 37.6  MCV 89.2 89.0 91.9  PLT 366 327 123456   Basic Metabolic Panel: Recent Labs  Lab 12/17/19 1635 12/17/19 2025 12/18/19 0435 12/19/19 0529 12/20/19 0535  NA 135  --  134* 138 141  K 3.5  --  2.8* 3.8 3.6  CL 101  --  106 114* 117*  CO2 21*  --  16* 16* 17*  GLUCOSE 105*  --  89 96 95  BUN 46*  --  43* 36* 23  CREATININE 2.79* 2.78* 2.17* 1.67* 1.34*  CALCIUM 9.6  --  8.5* 8.5* 8.1*  MG  --  2.4  --  2.1  --   PHOS  --  5.1*  --  2.8  --    GFR: Estimated Creatinine Clearance: 38.6 mL/min (A) (by C-G formula based on SCr of 1.34 mg/dL (H)). Liver Function Tests: Recent Labs  Lab 12/17/19 1636 12/19/19 0529  AST 17 12*  ALT 19 13  ALKPHOS 144* 103  BILITOT 1.0 0.6  PROT 7.7 5.9*  ALBUMIN 4.5 3.4*   Recent Labs  Lab 12/17/19 1635 12/18/19 0435  LIPASE 106* 191*   No results for input(s): AMMONIA in the last 168 hours. Coagulation Profile: No results for input(s): INR, PROTIME in the last 168 hours. Cardiac Enzymes: No results for input(s): CKTOTAL, CKMB, CKMBINDEX, TROPONINI in the last 168 hours. BNP (last 3 results) No results for input(s): PROBNP in the last 8760 hours. HbA1C: No results for input(s): HGBA1C in the last 72 hours. CBG: No results for input(s): GLUCAP in the last 168 hours. Lipid Profile: No results for input(s): CHOL,  HDL, LDLCALC, TRIG, CHOLHDL, LDLDIRECT in the last 72 hours. Thyroid Function Tests: Recent Labs    12/17/19 1635  TSH 1.436   Anemia Panel: No results for input(s): VITAMINB12, FOLATE, FERRITIN, TIBC, IRON, RETICCTPCT in the last 72 hours. Sepsis Labs: Recent Labs  Lab 12/17/19 2156  LATICACIDVEN 1.3    Recent Results (from the past 240 hour(s))  SARS CORONAVIRUS 2 (TAT 6-24 HRS) Nasopharyngeal Nasopharyngeal Swab     Status: None   Collection Time: 12/17/19  7:24 PM   Specimen: Nasopharyngeal Swab  Result Value Ref Range Status   SARS Coronavirus 2 NEGATIVE NEGATIVE Final    Comment: (NOTE) SARS-CoV-2 target nucleic acids are NOT DETECTED. The SARS-CoV-2 RNA is generally detectable in upper and lower respiratory specimens during the acute phase of infection. Negative results do not preclude SARS-CoV-2 infection, do not rule out co-infections with other pathogens, and should not be used as the sole basis for treatment or other patient management decisions. Negative results must be  combined with clinical observations, patient history, and epidemiological information. The expected result is Negative. Fact Sheet for Patients: SugarRoll.be Fact Sheet for Healthcare Providers: https://www.woods-mathews.com/ This test is not yet approved or cleared by the [place] FDA and  has been authorized for detection and/or diagnosis of SARS-CoV-2 by FDA under an Emergency Use Authorization (EUA). This EUA will remain  in effect (meaning this test can be used) for the duration of the COVID-19 declaration under Section 56 4(b)(1) of the Act, 21 U.S.C. section 360bbb-3(b)(1), unless the authorization is terminated or revoked sooner. Performed at [facility], [place] [address], [place], [place] [address]      Radiology Studies: No results found. Scheduled Meds: . atorvastatin  20 mg Oral QHS  . buPROPion  150 mg Oral Daily  .  clopidogrel  75 mg Oral Daily  . donepezil  10 mg Oral QHS  . escitalopram  10 mg Oral Daily  . feeding supplement (ENSURE ENLIVE)  237 mL Oral TID BM  . heparin  5,000 Units Subcutaneous Q8H  . isosorbide mononitrate  60 mg Oral Daily  . mouth rinse  15 mL Mouth Rinse BID  . memantine  10 mg Oral Daily  . metoprolol tartrate  50 mg Oral BID  . multivitamin with minerals  1 tablet Oral Daily  . sodium bicarbonate  650 mg Oral BID  . sodium chloride flush  3 mL Intravenous Once  . vitamin B-12  500 mcg Oral Daily   Continuous Infusions: . sodium chloride 100 mL/hr at 12/20/19 1200  . cefTRIAXone (ROCEPHIN)  IV Stopped (12/19/19 1641)     LOS: 3 days    Time spent: 25 mins    Angelie Kram, MD [facility]   If 7PM-7AM, please contact night-coverage

## 2019-12-21 LAB — BASIC METABOLIC PANEL
Anion gap: 12 (ref 5–15)
BUN: 15 mg/dL (ref 8–23)
CO2: 16 mmol/L — ABNORMAL LOW (ref 22–32)
Calcium: 8 mg/dL — ABNORMAL LOW (ref 8.9–10.3)
Chloride: 117 mmol/L — ABNORMAL HIGH (ref 98–111)
Creatinine, Ser: 1.06 mg/dL — ABNORMAL HIGH (ref 0.44–1.00)
GFR calc Af Amer: 56 mL/min — ABNORMAL LOW (ref >60)
GFR calc non Af Amer: 49 mL/min — ABNORMAL LOW (ref >60)
Glucose, Bld: 89 mg/dL (ref 70–99)
Potassium: 3.9 mmol/L (ref 3.5–5.1)
Sodium: 145 mmol/L (ref 135–145)

## 2019-12-21 LAB — PHOSPHORUS: Phosphorus: 2.7 mg/dL (ref 2.5–4.6)

## 2019-12-21 LAB — MAGNESIUM: Magnesium: 2 mg/dL (ref 1.7–2.4)

## 2019-12-21 MED ORDER — MEGESTROL ACETATE 400 MG/10ML PO SUSP
400.0000 mg | Freq: Every day | ORAL | Status: DC
  Administered 2019-12-21 – 2019-12-22 (×2): 400 mg via ORAL
  Filled 2019-12-21 (×2): qty 10

## 2019-12-21 NOTE — Plan of Care (Signed)
  Problem: Education: Goal: Knowledge of General Education information will improve Description: Including pain rating scale, medication(s)/side effects and non-pharmacologic comfort measures Outcome: Progressing   Problem: Health Behavior/Discharge Planning: Goal: Ability to manage health-related needs will improve Outcome: Progressing   Problem: Clinical Measurements: Goal: Will remain free from infection Outcome: Progressing   Problem: Nutrition: Goal: Adequate nutrition will be maintained Outcome: Progressing   Problem: Safety: Goal: Ability to remain free from injury will improve Outcome: Progressing   Problem: Activity: Goal: Risk for activity intolerance will decrease Outcome: Not Progressing   Problem: Elimination: Goal: Will not experience complications related to bowel motility Outcome: Not Progressing Goal: Will not experience complications related to urinary retention Outcome: Not Progressing

## 2019-12-21 NOTE — Evaluation (Signed)
Physical Therapy Evaluation Patient Details Name: Toni [name] MRN: [id number] DOB: [birth year] Today's Date: 12/21/2019   History of Present Illness  86 yo female admitted with AKI. Hx of CHF, CKD, CVA, dementia, NSTEMI  Clinical Impression  On eval, pt required Mod-Max assist for mobility. She will need +2 assist for further sessions. Pt presents with general weakness, decreased activity tolerance, and impaired gait and balance. No family present during session. Recommend ST rehab at SNF.     Follow Up Recommendations SNF    Equipment Recommendations  None recommended by PT    Recommendations for Other Services       Precautions / Restrictions Precautions Precautions: Fall Restrictions Weight Bearing Restrictions: No      Mobility  Bed Mobility Overal bed mobility: Needs Assistance Bed Mobility: Supine to Sit;Sit to Supine     Supine to sit: Mod assist;HOB elevated Sit to supine: Mod assist;HOB elevated   General bed mobility comments: Assist for trunk and bil LEs. Increased time. Utilized bedpad for positioning, scooting.  Transfers Overall transfer level: Needs assistance Equipment used: Rolling walker (2 wheeled) Transfers: Sit to/from Stand Sit to Stand: From elevated surface;Max assist         General transfer comment: x2. Assist to rise, stabilize, control descent. Pt stood for ~10 seconds each trial. Heavy posterior lean.  Ambulation/Gait             General Gait Details: NT-pt was unable to take any steps.  Stairs            Wheelchair Mobility    Modified Rankin (Stroke Patients Only)       Balance Overall balance assessment: Needs assistance   Sitting balance-Leahy Scale: Fair     Standing balance support: Bilateral upper extremity supported Standing balance-Leahy Scale: Poor                               Pertinent Vitals/Pain Pain Assessment: Faces Faces Pain Scale: Hurts little more Pain Location: L  UE Pain Descriptors / Indicators: Discomfort Pain Intervention(s): Monitored during session;Limited activity within patient's tolerance;Repositioned    Home Living Family/patient expects to be discharged to:: Unsure Living Arrangements: Spouse/significant other               Additional Comments: unsure of DME available at home    Prior Function           Comments: unsure of PLOF     Hand Dominance        Extremity/Trunk Assessment   Upper Extremity Assessment Upper Extremity Assessment: Generalized weakness    Lower Extremity Assessment Lower Extremity Assessment: Generalized weakness    Cervical / Trunk Assessment Cervical / Trunk Assessment: Normal  Communication      Cognition Arousal/Alertness: Awake/alert Behavior During Therapy: WFL for tasks assessed/performed Overall Cognitive Status: Within Functional Limits for tasks assessed                                        General Comments      Exercises     Assessment/Plan    PT Assessment Patient needs continued PT services  PT Problem List Decreased strength;Decreased mobility;Decreased activity tolerance;Decreased balance;Decreased knowledge of use of DME;Decreased cognition       PT Treatment Interventions DME instruction;Gait training;Therapeutic activities;Therapeutic exercise;Patient/family education;Balance training;Functional mobility training    PT  Goals (Current goals can be found in the Care Plan section)  Acute Rehab PT Goals Patient Stated Goal: none stated PT Goal Formulation: With patient Time For Goal Achievement: 01/04/20 Potential to Achieve Goals: Fair    Frequency Min 2X/week   Barriers to discharge        Co-evaluation               AM-PAC PT "6 Clicks" Mobility  Outcome Measure Help needed turning from your back to your side while in a flat bed without using bedrails?: A Lot Help needed moving from lying on your back to sitting on the side  of a flat bed without using bedrails?: A Lot Help needed moving to and from a bed to a chair (including a wheelchair)?: Total Help needed standing up from a chair using your arms (e.g., wheelchair or bedside chair)?: Total Help needed to walk in hospital room?: Total Help needed climbing 3-5 steps with a railing? : Total 6 Click Score: 8    End of Session   Activity Tolerance: Patient limited by fatigue;Patient limited by pain Patient left: in bed;with call bell/phone within reach;with bed alarm set   PT Visit Diagnosis: Muscle weakness (generalized) (M62.81);Difficulty in walking, not elsewhere classified (R26.2)    Time: [id number] PT Time Calculation (min) (ACUTE ONLY): 30 min   Charges:   PT Evaluation $PT Eval Moderate Complexity: 1 Mod PT Treatments $Therapeutic Activity: 8-22 mins           Doreatha Massed, PT Acute Rehabilitation

## 2019-12-21 NOTE — Care Management Important Message (Signed)
impImportant Message  Patient Details IM Letter given to Marney Doctor RN Case Manager to present to the Patient Name: Toni [name] MRN: [id number] Date of Birth: 1934-09-20   Medicare Important Message Given:  Yes     Kerin Salen 12/21/2019, 11:05 AM

## 2019-12-21 NOTE — Progress Notes (Signed)
PROGRESS NOTE    Toni [name]  [id number] DOB: 01-05-1934 DOA: 12/17/2019   PCP: Binnie Rail, MD   Brief Narrative: Patient is an 86 year old Caucasian female with past medical history significant for diverticulosis, status post partial colectomy (patient has a colostomy bag), C. difficile colitis, chronic kidney disease stage III, hypertension, hyperlipidemia, grade 1 diastolic dysfunction, colonic polyp and dementia.  Patient could not or will not give any significant history.  Most of the limited history came from the patient's caregiver.  According to the patient's caregiver, patient has been having watery stool over the last 3 to 4 days.  No associated nausea vomiting, no fever chills, no sick contacts and no abdominal pain.  Patient has had poor p.o. intake, and has barely made any urine in the last 24 hours.  On presentation to the hospital, leukocytosis of 11,300 was noted, serum creatinine of 2.79 (up from 1.17 from around September 2020).  Covid testing is negative. CT scan of the abdomen done revealed numerous large bilateral hepatic and renal cyst, stable partially calcified renal mass with low attenuation and severe aortic atherosclerosis.  No pancreatic abnormality noted.  Lipase of 106 was documented.  No headache, no neck pain, no URI symptoms, no chest pain, no shortness of breath.  He renal functions improving,   Assessment & Plan:   Active Problems:   AKI (acute kidney injury) ([place])  Acute kidney injury on chronic kidney disease stage IIIa: - Improving -Suspect acute kidney injury is likely prerenal. -Treated with IV hydration. Renal Fx improving.  Will stop fluid -CT abdomen also revealed renal and hepatic cyst, but no prior documentation of polycystic kidney disease. Patient continues to have minimal to negligible p.o. intake.  Concerning that patient may sustain AKI again.  Monitor while off fluid. -We will monitor renal function and electrolytes with adequate  oral hydration.  Watery diarrhea: improving -Etiology unclear. -CT abdomen and pelvis without contrast did not reveal colitis or diverticulitis, however, this is a noncontrast study. -COVID test negative -Stool fecal lactoferrin -GI panel  pending, discontinue C. difficile test. -Supportive treatment for now.  Volume depletion: - Improving. -Secondary to above -Continue with hydration.  Elevated lipase: -No other symptoms of pancreatitis reported.  Likely in the setting of viral gastroenteritis  Hypokalemia : Replacement done., Recheck labs.  UTI: Treated with ceftriaxone. Discontinue.  Poor p.o. intake. Likely in the setting of ongoing diarrhea.  Which is probably viral gastroenteritis. Anticipating slow progress. Continue oral nutrition. Megace ordered.  DVT prophylaxis: Subcutaneous heparin Code Status: Full code.   Family Communication:  Husband Disposition Plan:  PT recommends SNF, husband wants to pursue SNF. Consults called: None Disposition  PT evaluation and anticipated SNF   Consultants:   None  Procedures: None Antimicrobials:  Anti-infectives (From admission, onward)   Start     Dose/Rate Route Frequency Ordered Stop   12/18/19 1600  cefTRIAXone (ROCEPHIN) 1 g in sodium chloride 0.9 % 100 mL IVPB  Status:  Discontinued     1 g 200 mL/hr over 30 Minutes Intravenous Every 24 hours 12/18/19 1445 12/21/19 0724     Subjective: Minimal oral intake.  No nausea no vomiting.  Significantly fatigued and tired.  No fever no chills.  Liquid stool in colostomy bag.  Objective: Vitals:   12/20/19 1406 12/20/19 2111 12/21/19 0523 12/21/19 1458  BP: 130/74 (!) 151/72 (!) 139/92 138/60  Pulse: (!) 56 (!) 57 63 (!) 57  Resp: 17 16 18 14   Temp:  98.1  F (36.7 C) 97.8 F (36.6 C) 97.9 F (36.6 C)  TempSrc:  Oral Oral Oral  SpO2: 97% 96% 98% 98%  Weight:        Intake/Output Summary (Last 24 hours) at 12/21/2019 1718 Last data filed at 12/21/2019 1506  Gross per 24 hour  Intake 1040.78 ml  Output 2400 ml  Net -1359.22 ml   Filed Weights   12/18/19 1800  Weight: 89.6 kg    Examination:  General exam: Appears calm and comfortable  Respiratory system: Clear to auscultation. Respiratory effort normal. Cardiovascular system: S1 & S2 heard, RRR. No JVD, murmurs, rubs, gallops or clicks. No pedal edema. Gastrointestinal system: Abdomen is nondistended, soft and nontender. No organomegaly or masses felt. Normal bowel sounds heard. Central nervous system: Alert and oriented. No focal neurological deficits. Extremities:  No edema, no bruising, no swelling Skin: No rashes, lesions or ulcers Psychiatry: Judgement and insight appear normal. Mood & affect appropriate.   Data Reviewed: I have personally reviewed following labs and imaging studies  CBC: Recent Labs  Lab 12/17/19 1635 12/17/19 2156 12/19/19 0529  WBC 11.3* 10.5 7.9  HGB 15.3* 14.0 12.7  HCT 44.8 41.3 37.6  MCV 89.2 89.0 91.9  PLT 366 327 [id number]   Basic Metabolic Panel: Recent Labs  Lab 12/17/19 1635 12/17/19 2025 12/18/19 0435 12/19/19 0529 12/20/19 0535 12/21/19 0421  NA 135  --  134* 138 141 145  K 3.5  --  2.8* 3.8 3.6 3.9  CL 101  --  106 114* 117* 117*  CO2 21*  --  16* 16* 17* 16*  GLUCOSE 105*  --  89 96 95 89  BUN 46*  --  43* 36* 23 15  CREATININE 2.79* 2.78* 2.17* 1.67* 1.34* 1.06*  CALCIUM 9.6  --  8.5* 8.5* 8.1* 8.0*  MG  --  2.4  --  2.1  --  2.0  PHOS  --  5.1*  --  2.8  --  2.7   GFR: Estimated Creatinine Clearance: 48.8 mL/min (A) (by C-G formula based on SCr of 1.06 mg/dL (H)). Liver Function Tests: Recent Labs  Lab 12/17/19 1636 12/19/19 0529  AST 17 12*  ALT 19 13  ALKPHOS 144* 103  BILITOT 1.0 0.6  PROT 7.7 5.9*  ALBUMIN 4.5 3.4*   Recent Labs  Lab 12/17/19 1635 12/18/19 0435  LIPASE 106* 191*   No results for input(s): AMMONIA in the last 168 hours. Coagulation Profile: No results for input(s): INR, PROTIME in the last  168 hours. Cardiac Enzymes: No results for input(s): CKTOTAL, CKMB, CKMBINDEX, TROPONINI in the last 168 hours. BNP (last 3 results) No results for input(s): PROBNP in the last 8760 hours. HbA1C: No results for input(s): HGBA1C in the last 72 hours. CBG: No results for input(s): GLUCAP in the last 168 hours. Lipid Profile: No results for input(s): CHOL, HDL, LDLCALC, TRIG, CHOLHDL, LDLDIRECT in the last 72 hours. Thyroid Function Tests: No results for input(s): TSH, T4TOTAL, FREET4, T3FREE, THYROIDAB in the last 72 hours. Anemia Panel: No results for input(s): VITAMINB12, FOLATE, FERRITIN, TIBC, IRON, RETICCTPCT in the last 72 hours. Sepsis Labs: Recent Labs  Lab 12/17/19 2156  LATICACIDVEN 1.3    Recent Results (from the past 240 hour(s))  SARS CORONAVIRUS 2 (TAT 6-24 HRS) Nasopharyngeal Nasopharyngeal Swab     Status: None   Collection Time: 12/17/19  7:24 PM   Specimen: Nasopharyngeal Swab  Result Value Ref Range Status   SARS Coronavirus 2 NEGATIVE NEGATIVE Final  Comment: (NOTE) SARS-CoV-2 target nucleic acids are NOT DETECTED. The SARS-CoV-2 RNA is generally detectable in upper and lower respiratory specimens during the acute phase of infection. Negative results do not preclude SARS-CoV-2 infection, do not rule out co-infections with other pathogens, and should not be used as the sole basis for treatment or other patient management decisions. Negative results must be combined with clinical observations, patient history, and epidemiological information. The expected result is Negative. Fact Sheet for Patients: SugarRoll.be Fact Sheet for Healthcare Providers: https://www.woods-mathews.com/ This test is not yet approved or cleared by the [place] FDA and  has been authorized for detection and/or diagnosis of SARS-CoV-2 by FDA under an Emergency Use Authorization (EUA). This EUA will remain  in effect (meaning this test  can be used) for the duration of the COVID-19 declaration under Section 56 4(b)(1) of the Act, 21 U.S.C. section 360bbb-3(b)(1), unless the authorization is terminated or revoked sooner. Performed at [facility], [place] [address], [place], [place] [address]      Radiology Studies: No results found. Scheduled Meds: . atorvastatin  20 mg Oral QHS  . buPROPion  150 mg Oral Daily  . clopidogrel  75 mg Oral Daily  . donepezil  10 mg Oral QHS  . escitalopram  10 mg Oral Daily  . feeding supplement (ENSURE ENLIVE)  237 mL Oral TID BM  . isosorbide mononitrate  60 mg Oral Daily  . mouth rinse  15 mL Mouth Rinse BID  . megestrol  400 mg Oral Daily  . memantine  10 mg Oral Daily  . metoprolol tartrate  50 mg Oral BID  . multivitamin with minerals  1 tablet Oral Daily  . vitamin B-12  500 mcg Oral Daily   Continuous Infusions:    LOS: 4 days    Time spent: 25 mins    Berle Mull, MD [facility]   If 7PM-7AM, please contact night-coverage

## 2019-12-22 ENCOUNTER — Encounter: Payer: Self-pay | Admitting: Neurology

## 2019-12-22 LAB — CBC WITH DIFFERENTIAL/PLATELET
Abs Immature Granulocytes: 0.02 10*3/uL (ref 0.00–0.07)
Basophils Absolute: 0 10*3/uL (ref 0.0–0.1)
Basophils Relative: 0 %
Eosinophils Absolute: 0.3 10*3/uL (ref 0.0–0.5)
Eosinophils Relative: 4 %
HCT: 36.5 % (ref 36.0–46.0)
Hemoglobin: 12 g/dL (ref 12.0–15.0)
Immature Granulocytes: 0 %
Lymphocytes Relative: 25 %
Lymphs Abs: 1.7 10*3/uL (ref 0.7–4.0)
MCH: 30.5 pg (ref 26.0–34.0)
MCHC: 32.9 g/dL (ref 30.0–36.0)
MCV: 92.9 fL (ref 80.0–100.0)
Monocytes Absolute: 0.5 10*3/uL (ref 0.1–1.0)
Monocytes Relative: 8 %
Neutro Abs: 4.3 10*3/uL (ref 1.7–7.7)
Neutrophils Relative %: 63 %
Platelets: 219 10*3/uL (ref 150–400)
RBC: 3.93 MIL/uL (ref 3.87–5.11)
RDW: 13.7 % (ref 11.5–15.5)
WBC: 6.8 10*3/uL (ref 4.0–10.5)
nRBC: 0 % (ref 0.0–0.2)

## 2019-12-22 LAB — COMPREHENSIVE METABOLIC PANEL
ALT: 12 U/L (ref 0–44)
AST: 12 U/L — ABNORMAL LOW (ref 15–41)
Albumin: 3 g/dL — ABNORMAL LOW (ref 3.5–5.0)
Alkaline Phosphatase: 84 U/L (ref 38–126)
Anion gap: 6 (ref 5–15)
BUN: 13 mg/dL (ref 8–23)
CO2: 19 mmol/L — ABNORMAL LOW (ref 22–32)
Calcium: 8.4 mg/dL — ABNORMAL LOW (ref 8.9–10.3)
Chloride: 116 mmol/L — ABNORMAL HIGH (ref 98–111)
Creatinine, Ser: 1.07 mg/dL — ABNORMAL HIGH (ref 0.44–1.00)
GFR calc Af Amer: 56 mL/min — ABNORMAL LOW (ref >60)
GFR calc non Af Amer: 48 mL/min — ABNORMAL LOW (ref >60)
Glucose, Bld: 101 mg/dL — ABNORMAL HIGH (ref 70–99)
Potassium: 3.5 mmol/L (ref 3.5–5.1)
Sodium: 141 mmol/L (ref 135–145)
Total Bilirubin: 0.5 mg/dL (ref 0.3–1.2)
Total Protein: 5.4 g/dL — ABNORMAL LOW (ref 6.5–8.1)

## 2019-12-22 LAB — MAGNESIUM: Magnesium: 2 mg/dL (ref 1.7–2.4)

## 2019-12-22 MED ORDER — POTASSIUM CHLORIDE 20 MEQ PO PACK: 40.0000 meq | PACK | Freq: Every day | ORAL | 0 refills | Status: DC

## 2019-12-22 MED ORDER — MEGESTROL ACETATE 400 MG/10ML PO SUSP: 400.0000 mg | Freq: Every day | ORAL | 1 refills | Status: AC

## 2019-12-22 MED ORDER — PANTOPRAZOLE SODIUM 40 MG PO TBEC: 40.0000 mg | DELAYED_RELEASE_TABLET | Freq: Every day | ORAL | 0 refills | Status: AC

## 2019-12-22 NOTE — Discharge Summary (Addendum)
Physician Discharge Summary  Toni [name] [id number] DOB: [birth year] DOA: 12/17/2019  PCP: Binnie Rail, MD  Admit date: 12/17/2019 Discharge date: 12/22/2019  Admitted From: Home Disposition:  Home  Discharge Condition:Stable CODE STATUS:FULL Diet recommendation:  Regular    Brief/Interim Summary:  Patient is an 86 year old Caucasian female with past medical history significant for diverticulosis, status post partial colectomy (patient has a colostomy bag), C. difficile colitis, chronic kidney disease stage III, hypertension, hyperlipidemia, grade 1 diastolic dysfunction, colonic polyp and dementia. Patient could not or will not give any significant history. Most of the limited history came from the patient's caregiver. According to the patient's caregiver, patient has been having watery stool over the last 3 to 4 days. No associated nausea vomiting, no fever chills, no sick contacts and no abdominal pain. Patient has had poor p.o. intake, and has barely made any urine in the last 24 hours. On presentation to the hospital, leukocytosis of 11,300 was noted, serum creatinine of 2.79 (up from 1.17 from around September 2020). CT scan of the abdomen done revealed numerous large bilateral hepatic and renal cyst, stable partiallycalcified renal mass with low attenuation and severe aortic atherosclerosis. Currently she is hemodynamically stable.  Her diarrhea resolved.  AKI resolved.  She is not complaining of any abdominal pain, nausea or vomiting at present.  Her appetite remains poor.  Started on Megace.  She was seen by PT/OT and recommended skilled nursing facility but family wanted to take her home.  She is hemodynamically stable for discharge home today.  Following problems were addressed during her hospitalization:  Acute kidney injury on chronic kidney disease stage IIIa:  -Suspect acute kidney injury is likely prerenal. -Treated with IV hydration. Renal Fx improved and is  currently at baseline -CT abdomen also revealed renal and hepatic cyst, but no prior documentation of polycystic kidney disease.  Watery diarrhea: improving -Etiology unclear. -CT abdomen and pelvis without contrast did not reveal colitis or diverticulitis, however, this is a noncontrast study. -COVID test negative -Diarrhoea resolved  Elevated lipase: -No other symptoms of pancreatitis reported.  Likely in the setting of viral gastroenteritis  Hypokalemia :  -Supplemented and corrected.  UTI:  -Treated with ceftriaxone.Completed course  Poor p.o. intake: = Dietitian was following.  Started on Megace  Debility/deconditioning: -PT/OT recommended skilled nursing facility, family wanted to take her home  FOBT positive: - No change in the color of the stool.  Hemoglobin stable.  Started on Protonix.  Discharge Diagnoses:  Active Problems:   AKI (acute kidney injury) [facility])    Discharge Instructions  Discharge Instructions    Diet - low sodium heart healthy   Complete by: As directed    Discharge instructions   Complete by: As directed    1)Please follow up with your PCP in a week.  Do a CBC, BMP test during the follow-up 2)Take prescribed medications as instructed. 3)Follow up with Home Health.   Increase activity slowly   Complete by: As directed      Allergies as of 12/22/2019      Reactions   Amlodipine Other (See Comments)   edema      Medication List    TAKE these medications   atorvastatin 20 MG tablet Commonly known as: LIPITOR TAKE 1 TABLET BY MOUTH AT  BEDTIME   Benzethonium Chloride 0.18 % Misc Commonly known as: Anti-Bacterial Cleansing Wipes Apply Topically.   buPROPion 150 MG 24 hr tablet Commonly known as: WELLBUTRIN XL Take 1 tablet twice a  day   clopidogrel 75 MG tablet Commonly known as: PLAVIX Take 1 tablet (75 mg total) by mouth daily.   Closed-End Colostomy Pouch Misc Use as directed   Odor Spray Liqd uad   donepezil 10  MG tablet Commonly known as: ARICEPT Take 1 tablet (10 mg total) by mouth at bedtime.   escitalopram 10 MG tablet Commonly known as: Lexapro Take 1 tablet (10 mg total) by mouth daily.   furosemide 20 MG tablet Commonly known as: LASIX TAKE 1 TABLET BY MOUTH  DAILY   isosorbide mononitrate 60 MG 24 hr tablet Commonly known as: IMDUR TAKE 1 TABLET BY MOUTH  DAILY   megestrol 400 MG/10ML suspension Commonly known as: MEGACE Take 10 mLs (400 mg total) by mouth daily.   memantine 10 MG tablet Commonly known as: NAMENDA Take 1 tablet twice a day   metoprolol tartrate 50 MG tablet Commonly known as: LOPRESSOR TAKE 1 TABLET BY MOUTH  TWICE DAILY   pantoprazole 40 MG tablet Commonly known as: Protonix Take 1 tablet (40 mg total) by mouth daily.   potassium chloride 20 MEQ packet Commonly known as: Klor-Con Take 40 mEq by mouth daily for 5 days.   vitamin B-12 500 MCG tablet Commonly known as: CYANOCOBALAMIN Take 500 mcg by mouth daily.            Durable Medical Equipment  (From admission, onward)         Start     Ordered   12/22/19 1040  For home use only DME 3 n 1  Once     12/22/19 1039         Follow-up Information    [facility] Follow up.   Specialty: Home Health Services Contact information: [place] [address] [phone]        Binnie Rail, MD. Schedule an appointment as soon as possible for a visit in 1 week(s).   Specialty: Internal Medicine Contact information: [place] [address] [phone]          Allergies  Allergen Reactions  . Amlodipine Other (See Comments)    edema    Consultations:  None   Procedures/Studies: CT ABDOMEN PELVIS WO CONTRAST  Result Date: 12/17/2019 CLINICAL DATA:  Abdominal pain. EXAM: CT ABDOMEN AND PELVIS WITHOUT CONTRAST TECHNIQUE: Multidetector CT imaging of the abdomen and pelvis was performed following the standard protocol  without IV contrast. COMPARISON:  August 07, 2016 FINDINGS: Lower chest: Mild atelectasis is seen in the posterior aspect right lung base. Hepatobiliary: A stable 11.9 cm x 10.7 cm cyst is seen within the right lobe of the liver. Additional smaller cysts are seen scattered throughout the liver parenchyma. Surgical clips are seen in the gallbladder fossa. Pancreas: Unremarkable. No pancreatic ductal dilatation or surrounding inflammatory changes. Spleen: Normal in size without focal abnormality. Adrenals/Urinary Tract: Adrenal glands are unremarkable. Kidneys are normal in size, without renal calculi or hydronephrosis. A stable 2.2 cm x 2.4 cm partially calcified low-attenuation mass is seen in the anterior aspect of the mid right kidney. Multiple stable bilateral renal cysts are seen. Bladder is unremarkable. Stomach/Bowel: There is a right lower quadrant ostomy site. There is a prior hand. Appendix appears normal. No evidence of bowel wall thickening, distention, or inflammatory changes. Vascular/Lymphatic: Marked severity aortic atherosclerosis. No enlarged abdominal or pelvic lymph nodes. Reproductive: Status post hysterectomy. No adnexal masses. Other: No abdominal wall hernia or abnormality. No abdominopelvic ascites. Musculoskeletal: Multilevel  degenerative changes seen throughout the lumbar spine. IMPRESSION: 1. Numerous large bilateral hepatic and renal cysts. 2. Evidence of prior cholecystectomy. 3. Small hiatal hernia. 4. Stable partially calcified low-attenuation renal mass. 5. Postop changes with a subsequent left lower quadrant ostomy site. Electronically Signed   By: Virgina Norfolk M.D.   On: 12/17/2019 18:27      Subjective: Patient seen and examined the bedside this morning.  Hemodynamically stable for discharge.  Discharge Exam: Vitals:   12/21/19 2105 12/22/19 0654  BP: (!) 150/60 (!) 146/70  Pulse: 62 60  Resp: 17 17  Temp: 98.8 F (37.1 C) (!) 97.5 F (36.4 C)  SpO2: 92% 96%    Vitals:   12/21/19 0523 12/21/19 1458 12/21/19 2105 12/22/19 0654  BP: (!) 139/92 138/60 (!) 150/60 (!) 146/70  Pulse: 63 (!) 57 62 60  Resp: 18 14 17 17   Temp: 97.8 F (36.6 C) 97.9 F (36.6 C) 98.8 F (37.1 C) (!) 97.5 F (36.4 C)  TempSrc: Oral Oral Oral Oral  SpO2: 98% 98% 92% 96%  Weight:        General: Pt is alert, awake, not in acute distress Cardiovascular: RRR, S1/S2 +, no rubs, no gallops Respiratory: CTA bilaterally, no wheezing, no rhonchi Abdominal: Soft, NT, ND, bowel sounds +, colostomy Extremities: no edema, no cyanosis    The results of significant diagnostics from this hospitalization (including imaging, microbiology, ancillary and laboratory) are listed below for reference.     Microbiology: Recent Results (from the past 240 hour(s))  SARS CORONAVIRUS 2 (TAT 6-24 HRS) Nasopharyngeal Nasopharyngeal Swab     Status: None   Collection Time: 12/17/19  7:24 PM   Specimen: Nasopharyngeal Swab  Result Value Ref Range Status   SARS Coronavirus 2 NEGATIVE NEGATIVE Final    Comment: (NOTE) SARS-CoV-2 target nucleic acids are NOT DETECTED. The SARS-CoV-2 RNA is generally detectable in upper and lower respiratory specimens during the acute phase of infection. Negative results do not preclude SARS-CoV-2 infection, do not rule out co-infections with other pathogens, and should not be used as the sole basis for treatment or other patient management decisions. Negative results must be combined with clinical observations, patient history, and epidemiological information. The expected result is Negative. Fact Sheet for Patients: SugarRoll.be Fact Sheet for Healthcare Providers: https://www.woods-mathews.com/ This test is not yet approved or cleared by the [place] FDA and  has been authorized for detection and/or diagnosis of SARS-CoV-2 by FDA under an Emergency Use Authorization (EUA). This EUA will remain  in  effect (meaning this test can be used) for the duration of the COVID-19 declaration under Section 56 4(b)(1) of the Act, 21 U.S.C. section 360bbb-3(b)(1), unless the authorization is terminated or revoked sooner. Performed at [facility], [place] [address], [place],  [address]      Labs: BNP (last 3 results) No results for input(s): BNP in the last 8760 hours. Basic Metabolic Panel: Recent Labs  Lab 12/17/19 1635 12/17/19 2025 12/18/19 0435 12/19/19 0529 12/20/19 0535 12/21/19 0421 12/22/19 0510  NA  --   --  134* 138 141 145 141  K  --   --  2.8* 3.8 3.6 3.9 3.5  CL  --   --  106 114* 117* 117* 116*  CO2  --   --  16* 16* 17* 16* 19*  GLUCOSE  --   --  89 96 95 89 101*  BUN  --   --  43* 36* 23 15 13  CREATININE   < > 2.78* 2.17* 1.67* 1.34* 1.06* 1.07*  CALCIUM  --   --  8.5* 8.5* 8.1* 8.0* 8.4*  MG  --  2.4  --  2.1  --  2.0 2.0  PHOS  --  5.1*  --  2.8  --  2.7  --    < > = values in this interval not displayed.   Liver Function Tests: Recent Labs  Lab 12/17/19 1636 12/19/19 0529 12/22/19 0510  AST 17 12* 12*  ALT 19 13 12   ALKPHOS 144* 103 84  BILITOT 1.0 0.6 0.5  PROT 7.7 5.9* 5.4*  ALBUMIN 4.5 3.4* 3.0*   Recent Labs  Lab 12/17/19 1635 12/18/19 0435  LIPASE 106* 191*   No results for input(s): AMMONIA in the last 168 hours. CBC: Recent Labs  Lab 12/17/19 1635 12/17/19 2156 12/19/19 0529 12/22/19 0510  WBC 11.3* 10.5 7.9 6.8  NEUTROABS  --   --   --  4.3  HGB 15.3* 14.0 12.7 12.0  HCT 44.8 41.3 37.6 36.5  MCV 89.2 89.0 91.9 92.9  PLT 366 327 274 219   Cardiac Enzymes: No results for input(s): CKTOTAL, CKMB, CKMBINDEX, TROPONINI in the last 168 hours. BNP: Invalid input(s): POCBNP CBG: No results for input(s): GLUCAP in the last 168 hours. D-Dimer No results for input(s): DDIMER in the last 72 hours. Hgb A1c No results for input(s): HGBA1C in the last 72 hours. Lipid Profile No results for input(s): CHOL, HDL, LDLCALC,  TRIG, CHOLHDL, LDLDIRECT in the last 72 hours. Thyroid function studies No results for input(s): TSH, T4TOTAL, T3FREE, THYROIDAB in the last 72 hours.  Invalid input(s): FREET3 Anemia work up No results for input(s): VITAMINB12, FOLATE, FERRITIN, TIBC, IRON, RETICCTPCT in the last 72 hours. Urinalysis    Component Value Date/Time   COLORURINE YELLOW 12/17/2019 2110   APPEARANCEUR HAZY (A) 12/17/2019 2110   LABSPEC 1.020 12/17/2019 2110   PHURINE 5.0 12/17/2019 2110   GLUCOSEU NEGATIVE 12/17/2019 2110   GLUCOSEU NEGATIVE 10/19/2019 1338   HGBUR NEGATIVE 12/17/2019 2110   [place] NEGATIVE 12/17/2019 2110   KETONESUR NEGATIVE 12/17/2019 2110   PROTEINUR 30 (A) 12/17/2019 2110   UROBILINOGEN 0.2 10/19/2019 1338   NITRITE NEGATIVE 12/17/2019 2110   LEUKOCYTESUR MODERATE (A) 12/17/2019 2110   Sepsis Labs Invalid input(s): PROCALCITONIN,  WBC,  LACTICIDVEN Microbiology Recent Results (from the past 240 hour(s))  SARS CORONAVIRUS 2 (TAT 6-24 HRS) Nasopharyngeal Nasopharyngeal Swab     Status: None   Collection Time: 12/17/19  7:24 PM   Specimen: Nasopharyngeal Swab  Result Value Ref Range Status   SARS Coronavirus 2 NEGATIVE NEGATIVE Final    Comment: (NOTE) SARS-CoV-2 target nucleic acids are NOT DETECTED. The SARS-CoV-2 RNA is generally detectable in upper and lower respiratory specimens during the acute phase of infection. Negative results do not preclude SARS-CoV-2 infection, do not rule out co-infections with other pathogens, and should not be used as the sole basis for treatment or other patient management decisions. Negative results must be combined with clinical observations, patient history, and epidemiological information. The expected result is Negative. Fact Sheet for Patients: SugarRoll.be Fact Sheet for Healthcare Providers: https://www.woods-mathews.com/ This test is not yet approved or cleared by the [place] FDA  and  has been authorized for detection and/or diagnosis of SARS-CoV-2 by FDA under an Emergency Use Authorization (EUA). This EUA will remain  in effect (meaning this test can be used) for the duration of the COVID-19 declaration under Section 56  4(b)(1) of the Act, 21 U.S.C. section 360bbb-3(b)(1), unless the authorization is terminated or revoked sooner. Performed at [facility], [place] [address], [place], [place] [address]     Please note: You were cared for by a hospitalist during your hospital stay. Once you are discharged, your primary care physician will handle any further medical issues. Please note that NO REFILLS for any discharge medications will be authorized once you are discharged, as it is imperative that you return to your primary care physician (or establish a relationship with a primary care physician if you do not have one) for your post hospital discharge needs so that they can reassess your need for medications and monitor your lab values.    Time coordinating discharge: 40 minutes  SIGNED:   Shelly Coss, MD  [facility] 12/22/2019, 10:40 AM Pager [phone]  If 7PM-7AM, please contact night-coverage [email or web address] Password [email or web address]

## 2019-12-22 NOTE — TOC Initial Note (Signed)
Transition of Care [facility]) - Initial/Assessment Note    Patient Details  Name: Toni [name] MRN: [id number] Date of Birth: 12/26/1933  [facility]) CM/SW Contact:    Lynnell Catalan, RN Phone Number: 12/22/2019, 9:39 AM  Clinical Narrative:                 Spoke wit husband and patient at length. Husband would like to take pt back home with home health services.  Expected Discharge Plan: [place] Barriers to Discharge: Continued Medical Work up   Patient Goals and CMS Choice   CMS Medicare.gov Compare Post Acute Care list provided to:: Patient Represenative (must comment)(Husband) Choice offered to / list presented to : Spouse  Expected Discharge Plan and Services Expected Discharge Plan: [place]   Discharge Planning Services: CM Consult Post Acute Care Choice: [place] arrangements for the past 2 months: Single Family Home                 DME Arranged: 3-N-1 DME Agency: [employer] Date DME Agency Contacted: 12/22/19 Time DME Agency Contacted: [phone] Representative spoke with at DME Agency: Thedore Mins HH Arranged: RN, PT, OT, Nurse's Aide, Social Work   Date [place]: 12/22/19 Time [place]: [phone] Representative spoke with at [place]: [place] Arrangements/Services Living arrangements for the past 2 months: [place] Lives with:: Spouse Patient language and need for interpreter reviewed:: Yes Do you feel safe going back to the place where you live?: Yes      Need for Family Participation in Patient Care: Yes (Comment) Care giver support system in place?: Yes (comment)   Criminal Activity/Legal Involvement Pertinent to Current Situation/Hospitalization: No - Comment as needed  Activities of Daily Living   ADL Screening (condition at time of admission) Patient's cognitive ability adequate to safely complete daily activities?: No Is the patient deaf or have  difficulty hearing?: Yes Does the patient have difficulty seeing, even when wearing glasses/contacts?: No Does the patient have difficulty concentrating, remembering, or making decisions?: Yes Patient able to express need for assistance with ADLs?: Yes Does the patient have difficulty dressing or bathing?: No Independently performs ADLs?: Yes (appropriate for developmental age) Does the patient have difficulty walking or climbing stairs?: Yes Weakness of Legs: Both Weakness of Arms/Hands: Both  Permission Sought/Granted Permission sought to share information with : Facility Art therapist granted to share information with : Yes, Verbal Permission Granted     Permission granted to share info w AGENCY: [facility]        Emotional Assessment Appearance:: Appears stated age            Admission diagnosis:  AKI (acute kidney injury) ([place]) [N17.9] Patient Active Problem List   Diagnosis Date Noted  . AKI (acute kidney injury) ([place]) 12/17/2019  . Left leg swelling 10/28/2019  . Left-sided weakness 09/01/2019  . Closed fracture of left distal radius and ulna, initial encounter 03/25/2019  . Left wrist pain 03/24/2019  . Poor balance 03/24/2019  . Urinary incontinence 08/26/2018  . Skin irritation 06/20/2018  . Knee pain, bilateral 04/09/2018  . Prediabetes 12/19/2017  . Bilateral leg edema 01/30/2017  . Colostomy status ([place]) 01/07/2017  . NSTEMI (non-ST elevated myocardial infarction) ([place]) 10/26/2016  . S/P laparoscopic colectomy 10/19/2016  . Colovesical fistula 08/27/2016  . Chronic fatigue 03/30/2016  . Moderate dementia with behavioral disturbance ([place]) 03/22/2016  . Hyperlipidemia 03/22/2016  . Depression  09/21/2015  . History of stroke 09/21/2015  . CKD (chronic kidney disease)   . Lesion of right native kidney 04/13/2015  . Diastolic CHF ([place]) [id number]  . History of Clostridium difficile infection 04/05/2015  . Essential hypertension 03/23/2015   . Abnormal CT of brain 03/04/2015  . Cervicogenic headache 01/22/2012  . Diverticulosis of large intestine 10/28/2009  . DIVERTICULITIS, HX OF 10/28/2009  . COLONIC POLYPS, HX OF 03/07/2009  . Anxiety 07/28/2008  . DRY EYE SYNDROME 03/12/2008  . HEPATIC CYST 04/21/2007  . MIGRAINES, HX OF 04/21/2007   PCP:  Binnie Rail, MD Pharmacy:   [facility], [place] [address] [place] [address] Phone: [phone] Fax: [phone]  [id number] - [place], [place] [address] [place] [address] Phone: [phone] Fax: [phone]  [place], [place] - [address] [place] [address] Phone: [phone] Fax: [place] [id number] [place], [place] - [place]  [address] Phone: [phone] Fax: [phone]     Social Determinants of Health (SDOH) Interventions    Readmission Risk Interventions No flowsheet data found.

## 2019-12-23 ENCOUNTER — Telehealth (INDEPENDENT_AMBULATORY_CARE_PROVIDER_SITE_OTHER): Payer: Medicare Other | Admitting: Neurology

## 2019-12-23 ENCOUNTER — Telehealth: Payer: Self-pay | Admitting: Internal Medicine

## 2019-12-23 ENCOUNTER — Other Ambulatory Visit: Payer: Self-pay

## 2019-12-23 ENCOUNTER — Telehealth: Payer: Self-pay | Admitting: *Deleted

## 2019-12-23 VITALS — Ht 65.0 in | Wt 210.0 lb

## 2019-12-23 DIAGNOSIS — N183 Chronic kidney disease, stage 3 unspecified: Secondary | ICD-10-CM | POA: Diagnosis not present

## 2019-12-23 DIAGNOSIS — F0391 Unspecified dementia with behavioral disturbance: Secondary | ICD-10-CM | POA: Diagnosis not present

## 2019-12-23 DIAGNOSIS — G43909 Migraine, unspecified, not intractable, without status migrainosus: Secondary | ICD-10-CM | POA: Diagnosis not present

## 2019-12-23 DIAGNOSIS — I700 Atherosclerosis of aorta: Secondary | ICD-10-CM | POA: Diagnosis not present

## 2019-12-23 DIAGNOSIS — K219 Gastro-esophageal reflux disease without esophagitis: Secondary | ICD-10-CM | POA: Diagnosis not present

## 2019-12-23 DIAGNOSIS — J9811 Atelectasis: Secondary | ICD-10-CM | POA: Diagnosis not present

## 2019-12-23 DIAGNOSIS — I5030 Unspecified diastolic (congestive) heart failure: Secondary | ICD-10-CM | POA: Diagnosis not present

## 2019-12-23 DIAGNOSIS — E876 Hypokalemia: Secondary | ICD-10-CM | POA: Diagnosis not present

## 2019-12-23 DIAGNOSIS — Q6102 Congenital multiple renal cysts: Secondary | ICD-10-CM | POA: Diagnosis not present

## 2019-12-23 DIAGNOSIS — K449 Diaphragmatic hernia without obstruction or gangrene: Secondary | ICD-10-CM | POA: Diagnosis not present

## 2019-12-23 DIAGNOSIS — H04129 Dry eye syndrome of unspecified lacrimal gland: Secondary | ICD-10-CM | POA: Diagnosis not present

## 2019-12-23 DIAGNOSIS — K7689 Other specified diseases of liver: Secondary | ICD-10-CM | POA: Diagnosis not present

## 2019-12-23 DIAGNOSIS — K529 Noninfective gastroenteritis and colitis, unspecified: Secondary | ICD-10-CM | POA: Diagnosis not present

## 2019-12-23 DIAGNOSIS — I130 Hypertensive heart and chronic kidney disease with heart failure and stage 1 through stage 4 chronic kidney disease, or unspecified chronic kidney disease: Secondary | ICD-10-CM | POA: Diagnosis not present

## 2019-12-23 DIAGNOSIS — I252 Old myocardial infarction: Secondary | ICD-10-CM | POA: Diagnosis not present

## 2019-12-23 DIAGNOSIS — I69398 Other sequelae of cerebral infarction: Secondary | ICD-10-CM | POA: Diagnosis not present

## 2019-12-23 DIAGNOSIS — F419 Anxiety disorder, unspecified: Secondary | ICD-10-CM | POA: Diagnosis not present

## 2019-12-23 DIAGNOSIS — M47816 Spondylosis without myelopathy or radiculopathy, lumbar region: Secondary | ICD-10-CM | POA: Diagnosis not present

## 2019-12-23 DIAGNOSIS — F03B18 Unspecified dementia, moderate, with other behavioral disturbance: Secondary | ICD-10-CM

## 2019-12-23 DIAGNOSIS — Z7902 Long term (current) use of antithrombotics/antiplatelets: Secondary | ICD-10-CM | POA: Diagnosis not present

## 2019-12-23 DIAGNOSIS — R29898 Other symptoms and signs involving the musculoskeletal system: Secondary | ICD-10-CM | POA: Diagnosis not present

## 2019-12-23 DIAGNOSIS — F329 Major depressive disorder, single episode, unspecified: Secondary | ICD-10-CM | POA: Diagnosis not present

## 2019-12-23 DIAGNOSIS — E785 Hyperlipidemia, unspecified: Secondary | ICD-10-CM | POA: Diagnosis not present

## 2019-12-23 DIAGNOSIS — K5730 Diverticulosis of large intestine without perforation or abscess without bleeding: Secondary | ICD-10-CM | POA: Diagnosis not present

## 2019-12-23 DIAGNOSIS — E860 Dehydration: Secondary | ICD-10-CM | POA: Diagnosis not present

## 2019-12-23 DIAGNOSIS — D72829 Elevated white blood cell count, unspecified: Secondary | ICD-10-CM | POA: Diagnosis not present

## 2019-12-23 LAB — GI PATHOGEN PANEL BY PCR, STOOL

## 2019-12-23 MED ORDER — BUPROPION HCL ER (XL) 150 MG PO TB24: ORAL_TABLET | ORAL | 3 refills | Status: AC

## 2019-12-23 MED ORDER — DONEPEZIL HCL 10 MG PO TABS: 10.0000 mg | ORAL_TABLET | Freq: Every day | ORAL | 3 refills | Status: AC

## 2019-12-23 MED ORDER — MEMANTINE HCL 10 MG PO TABS: ORAL_TABLET | ORAL | 3 refills | Status: AC

## 2019-12-23 NOTE — Progress Notes (Signed)
Virtual Visit via Telephone Note The purpose of this virtual visit is to provide medical care while limiting exposure to the novel coronavirus.    Consent was obtained for phone visit:  Yes.   Answered questions that patient had about telehealth interaction:  Yes.   I discussed the limitations, risks, security and privacy concerns of performing an evaluation and management service by telephone. I also discussed with the patient that there may be a patient responsible charge related to this service. The patient expressed understanding and agreed to proceed.  Pt location: Home Physician Location: office Name of referring provider:  Binnie Rail, MD I connected with .Toni [name] at patients initiation/request on 12/23/2019 at  8:30 AM EST by telephone and verified that I am speaking with the correct person using two identifiers.  Pt MRN:  [id number] Pt DOB:  05/18/1934   History of Present Illness:  The patient was seen as a virtual video visit on 12/23/2019. She was last seen 7 months ago for moderate dementia with behavioral disturbance. Her husband and son Nicki Reaper are present during the e-visit to provide additional information. Jahniece was discharged from the hospital yesterday, she was admitted for 5 days for AKI, she was having diarrhea, poor PO intake, and decreased urine output. She was evaluated by PT/OT who recommended SNF, family wanted to take her home. Since coming home yesterday afternoon, her husband reports that she is a "different person" and that he "bit more than I can chew." She has absolutely no strength and does not seem to understand his instructions. She could not understand how to position herself on the bedside commode, causing more urine to go on the floor than the commode. She was crying in pain last night due to her left shoulder, they have been trying help her with transfers. She is able to answer simple questions such as the names of her husband and son, she  knows she is at home. She is able to say she does not have any headaches or dizziness. She is able to swallow her medications. She ate a little yesterday. Per hospital discharge summary, she is no longer on mirtazapine. She is on Lexapro and Wellbutrin, as well as Donepezil and Memantine.   HPI: This is a pleasant 85 yo ambidextrous left-hand dominant woman with a history of hypertension, hyperlipidemia, presenting for evaluation of memory loss with abnormal head CT. When asked about her memory, Ms. Imrie reports her short-term memory is "not good at all." Long-term memory is good, she can remember details about her childhood well. She started noticing changes in the past year, but worse in the past 4-5 months. She would forget conversations from 10 minutes ago. She forgot to pay bills in the past 2-3 months. She left the stove on twice around 3-4 months ago. She denies getting lost driving. She denies any problems multitasking, very seldom word-finding difficulties. Her son has noticed that when he calls her, she would repeat the same story at the end of the call, or have the same conversation 10 minutes later. He also noticed changes in the past 1-2 years, worse in the past 3-4 months. Her daughter feels symptoms started a little longer than 2 years ago, she forgot a conversation that someone was in jail in December 2015. She was staying at her daughter's house and forgot what they had talked about on their to-do list. They also have noticed she is very anxious. Her husband reports she had taken something out  of the freezer one time, he came out the next day to see some things were not put back inside. He has noticed that she is more argumentative.   She reports 4 episodes where a shade would come up her right eye. This would last for a few minutes, last episode was in the fall of last year. No associated headache or focal numbness/tingling/weakness. She states she "always has headaches," indicating these  are sinus headaches. She had seen Dr. Jacelyn Grip in 2013 and was diagnosed with cervicogenic headaches. She has headaches around three times a week, with right frontal throbbing pain, relieved with over the counter pain medication. There is no associated nausea/vomiting/photo/phonophobia. She has been diagnosed with migraines where she has a round circle in her right eye occurring around 3-4 times a month. She denies any dizziness, diplopia, blurred vision, neck/back pain, focal numbness/tingling/weakness. She has chronic constipations. She has occasional hand tremors. No anosmia. She denies any family history of memory problems. No history of head injuries. She drinks 1 to 1-1/2 glasses of wine at night.   I personally reviewed head CT without contrast done 03/03/15. There were several hypodensities in the bilateral hemispheres, right greater than left This was read as multifocal posterior circulation infarction with the largest area in the right occiptial pole and smaller area in th e left occipital cortex. Two small areas of cortical and subcortical infarct in the high right parietal lobe, small vessel infarct in the upper right cerebellum. These could be subacute or chronic. Brain atrophy with mild frontal predominance seen.  MRI brain without contrast which did not show any acute changes, there was generalized atrophy with ventricular enlargement, chronic infarcts in the bilateral occipital lobes, right greater than left, small chronic infarct in the right parietal cortex, chronic microvascular disease.    Observations/Objective:   General: Patient is lying in bed, tired-appearing, keeping her eyes closed during the visit. She responds when asked questions. Tries to follow commands such as lifting arms against gravity, wincing due to left shoulder pain. She is oriented x 2, she had difficulty with repetition, difficulty with immediate memory testing, unable to do serial 7s. Able to give 6 F words (nl >  11).  Montreal Cognitive Assessment Blind 12/23/2019 05/22/2019 03/22/2015  Attention: Read list of digits (0/2) 1 2 1   Attention: Read list of letters (0/1) 0 0 1  Attention: Serial 7 subtraction starting at 100 (0/3) 0 0 1  Language: Repeat phrase (0/2) 1 1 2   Language : Fluency (0/1) 0 0 1  Abstraction (0/2) 0 0 2  Delayed Recall (0/5) 0 0 0  Orientation (0/6) 1 2 6   Total 3 5 -    Assessment and Plan:   This is a pleasant 86 yo LH woman with vascular risk factors including hypertension, hyperlipidemia, with moderate dementia with behavioral disturbance. [place] Blind (done over phone) today 3/22 (5/22 in June 2020). She has had significant decline since last visit, she was recently discharged from the hospital yesterday and clearly needs 24/7 assistance. This was discussed with her husband and son today. They were unsure of medications, instructed to stop mirtazapine due to lethargy. She will need PT/OT/nursing care. Continue Donepezil, Memantine, Wellbutrin, Lexapro for now. She will follow-up in 6 months or earlier if needed.   Follow Up Instructions:   -I discussed the assessment and treatment plan with the patient. The patient was provided an opportunity to ask questions and all were answered. The patient agreed with the plan and demonstrated  an understanding of the instructions.   The patient was advised to call back or seek an in-person evaluation if the symptoms worsen or if the condition fails to improve as anticipated.    Cameron Sprang, MD

## 2019-12-23 NOTE — Telephone Encounter (Signed)
Copied from [place] [phone]. Topic: Quick Communication - Home Health Verbal Orders >> Dec 23, 2019 11:56 AM Yvette Rack wrote: Caller/Agency: Roselie Awkward with [facility] Number: [phone] Requesting OT/PT/Skilled Nursing/Social Work/Speech Therapy: PT, OT and Social Worker ordered by the hospital  Frequency: PT 2 times a week for 4 weeks, 1 time a week for 1 week - Pt husband leaning toward skill nursing facility placement

## 2019-12-23 NOTE — Telephone Encounter (Signed)
Verbal orders given  

## 2019-12-23 NOTE — Telephone Encounter (Signed)
Transition Care Management Follow-up Telephone Call   Date discharged? 12/22/19 How have you been since you were released from the hospital? Pt states she is doing fine   Do you understand why you were in the hospital? YES   Do you understand the discharge instructions? YES   Where were you discharged to? Home   Items Reviewed:  Medications reviewed: YES, she states there wasn't any changes  Allergies reviewed: YES  Dietary changes reviewed: YES, not eat but trying to regain her strength  Referrals reviewed: YES, currently have [facility] health service   Functional Questionnaire:   Activities of Daily Living (ADLs):   She states she are independent in the following: bathing and hygiene, feeding, continence, grooming, toileting and dressing States she require assistance with the following: ambulation   Any transportation issues/concerns?: NO   Any patient concerns? NO   Confirmed importance and date/time of follow-up visits scheduled YES, appt 12/29/19  Provider Appointment booked with Dr. Quay Burow  Confirmed with patient if condition begins to worsen call PCP or go to the ER.  Patient was given the office number and encouraged to call back with question or concerns.  : YES

## 2019-12-24 ENCOUNTER — Telehealth: Payer: Self-pay | Admitting: Internal Medicine

## 2019-12-24 DIAGNOSIS — F0391 Unspecified dementia with behavioral disturbance: Secondary | ICD-10-CM | POA: Diagnosis not present

## 2019-12-24 DIAGNOSIS — I252 Old myocardial infarction: Secondary | ICD-10-CM | POA: Diagnosis not present

## 2019-12-24 DIAGNOSIS — I130 Hypertensive heart and chronic kidney disease with heart failure and stage 1 through stage 4 chronic kidney disease, or unspecified chronic kidney disease: Secondary | ICD-10-CM | POA: Diagnosis not present

## 2019-12-24 DIAGNOSIS — N183 Chronic kidney disease, stage 3 unspecified: Secondary | ICD-10-CM | POA: Diagnosis not present

## 2019-12-24 DIAGNOSIS — E860 Dehydration: Secondary | ICD-10-CM | POA: Diagnosis not present

## 2019-12-24 DIAGNOSIS — I5030 Unspecified diastolic (congestive) heart failure: Secondary | ICD-10-CM | POA: Diagnosis not present

## 2019-12-24 NOTE — Telephone Encounter (Signed)
Toni [name] with [facility] is calling in to ask if provider will complete a FL2 form for pt? Pt was just discharged from the hospital.     CB: [phone]   Fax: [phone] - Attn: Levada Dy

## 2019-12-25 DIAGNOSIS — E860 Dehydration: Secondary | ICD-10-CM | POA: Diagnosis not present

## 2019-12-25 DIAGNOSIS — N183 Chronic kidney disease, stage 3 unspecified: Secondary | ICD-10-CM | POA: Diagnosis not present

## 2019-12-25 DIAGNOSIS — I5030 Unspecified diastolic (congestive) heart failure: Secondary | ICD-10-CM | POA: Diagnosis not present

## 2019-12-25 DIAGNOSIS — I252 Old myocardial infarction: Secondary | ICD-10-CM | POA: Diagnosis not present

## 2019-12-25 DIAGNOSIS — F0391 Unspecified dementia with behavioral disturbance: Secondary | ICD-10-CM | POA: Diagnosis not present

## 2019-12-25 DIAGNOSIS — I130 Hypertensive heart and chronic kidney disease with heart failure and stage 1 through stage 4 chronic kidney disease, or unspecified chronic kidney disease: Secondary | ICD-10-CM | POA: Diagnosis not present

## 2019-12-25 NOTE — Telephone Encounter (Signed)
FL2 sent to [facility]. Husband is aware.

## 2019-12-25 NOTE — Telephone Encounter (Signed)
Pts husband called and is requesting to have this paperwork filled out asap. Pts husband states the home is available to take her now. Please advise as pts husband states he is not able to handle it anymore.

## 2019-12-27 DIAGNOSIS — N390 Urinary tract infection, site not specified: Secondary | ICD-10-CM | POA: Insufficient documentation

## 2019-12-27 DIAGNOSIS — R627 Adult failure to thrive: Secondary | ICD-10-CM | POA: Insufficient documentation

## 2019-12-27 NOTE — Progress Notes (Deleted)
Subjective:    Patient ID: Toni [name], female    DOB: 1934-06-30, 86 y.o.   MRN: [id number]  HPI The patient is here for follow up from her recent hospitalization.  Admitted 12/17/19 - 12/22/19   She was having watery stool for 3-4 days w/o nausea/vomiting, fever/chills, and abdominal pain.  She has had poor po intake and had barely made any urine in the past 24 hrs.    In ED:  WBC 11.3, Cr 2.79.  CT abdomen showed numerous large b/l hepatic and renal cysts, stable partially calcified renal mass with low attenuation and severe aortic atherosclerosis.   AKI on CKD, dehydration: AKI likely prerenal IVF - renal function improved Ct A/P - renal and hepatic cysts  Diarrhea, improving: Etiology unclear No colitis/diverticulitis on non contrast CT covid test negative Diarrhea resolved by d/c  Elevated lipase: No symptoms of pancreatitis - likely related to viral gastroenteritis  Hypokalemia: Supplemented  Resolved  UTI: Treated with ceftriaxone  Completed course  Poor PO intake: Started in megace  Debility, deconditioning: PT/OT recommended SNF - family decided to take her home  FOBT positive: No change in color of stool H/H stable Started on protonix  Medications and allergies reviewed with patient and updated if appropriate.  Patient Active Problem List   Diagnosis Date Noted  . UTI (urinary tract infection) 12/27/2019  . FTT (failure to thrive) in adult 12/27/2019  . AKI (acute kidney injury) ([place]) 12/17/2019  . Left leg swelling 10/28/2019  . Left-sided weakness 09/01/2019  . Closed fracture of left distal radius and ulna, initial encounter 03/25/2019  . Left wrist pain 03/24/2019  . Poor balance 03/24/2019  . Urinary incontinence 08/26/2018  . Skin irritation 06/20/2018  . Knee pain, bilateral 04/09/2018  . Prediabetes 12/19/2017  . Bilateral leg edema 01/30/2017  . Colostomy status ([place]) 01/07/2017  . NSTEMI (non-ST elevated myocardial  infarction) ([place]) 10/26/2016  . S/P laparoscopic colectomy 10/19/2016  . Colovesical fistula 08/27/2016  . Chronic fatigue 03/30/2016  . Moderate dementia with behavioral disturbance ([place]) 03/22/2016  . Hyperlipidemia 03/22/2016  . Depression 09/21/2015  . History of stroke 09/21/2015  . CKD (chronic kidney disease)   . Diarrhea 05/03/2015  . Lesion of right native kidney 04/13/2015  . Diastolic CHF ([place]) 123456  . History of Clostridium difficile infection 04/05/2015  . Essential hypertension 03/23/2015  . Abnormal CT of brain 03/04/2015  . Cervicogenic headache 01/22/2012  . Diverticulosis of large intestine 10/28/2009  . DIVERTICULITIS, HX OF 10/28/2009  . COLONIC POLYPS, HX OF 03/07/2009  . Anxiety 07/28/2008  . DRY EYE SYNDROME 03/12/2008  . HEPATIC CYST 04/21/2007  . MIGRAINES, HX OF 04/21/2007    Current Outpatient Medications on File Prior to Visit  Medication Sig Dispense Refill  . atorvastatin (LIPITOR) 20 MG tablet TAKE 1 TABLET BY MOUTH AT  BEDTIME 90 tablet 0  . Benzethonium Chloride (ANTI-BACTERIAL CLEANSING WIPES) 0.18 % MISC Apply Topically. 25 each 1  . buPROPion (WELLBUTRIN XL) 150 MG 24 hr tablet Take 1 tablet twice a day 180 tablet 3  . clopidogrel (PLAVIX) 75 MG tablet Take 1 tablet (75 mg total) by mouth daily. 7 tablet 0  . cyanocobalamin 500 MCG tablet Take 500 mcg by mouth daily.    Marland Kitchen donepezil (ARICEPT) 10 MG tablet Take 1 tablet (10 mg total) by mouth at bedtime. 90 tablet 3  . escitalopram (LEXAPRO) 10 MG tablet Take 1 tablet (10 mg total) by mouth daily. 90 tablet 1  .  furosemide (LASIX) 20 MG tablet TAKE 1 TABLET BY MOUTH  DAILY 90 tablet 1  . isosorbide mononitrate (IMDUR) 60 MG 24 hr tablet TAKE 1 TABLET BY MOUTH  DAILY 90 tablet 3  . megestrol (MEGACE) 400 MG/10ML suspension Take 10 mLs (400 mg total) by mouth daily. 240 mL 1  . memantine (NAMENDA) 10 MG tablet Take 1 tablet twice a day 180 tablet 3  . metoprolol tartrate (LOPRESSOR) 50 MG  tablet TAKE 1 TABLET BY MOUTH  TWICE DAILY 180 tablet 1  . Ostomy Supplies (CLOSED-END COLOSTOMY POUCH) MISC Use as directed 20 each 0  . Ostomy Supplies (ODOR SPRAY) LIQD uad 118 mL 11  . pantoprazole (PROTONIX) 40 MG tablet Take 1 tablet (40 mg total) by mouth daily. 30 tablet 0  . potassium chloride (KLOR-CON) 20 MEQ packet Take 40 mEq by mouth daily for 5 days. 10 packet 0   No current facility-administered medications on file prior to visit.    Past Medical History:  Diagnosis Date  . Acute kidney injury ([place])   . C. difficile colitis 2016  . Colonic polyp   . Depression   . Diverticulosis   . GERD (gastroesophageal reflux disease)   . Headache    sinus  . Helicobacter pylori gastritis 1987  . Hepatic cyst   . Hyperlipemia   . Hypertension   . Pneumonia    hx of x 2   . PUD (peptic ulcer disease) 1987   with h pylori    Past Surgical History:  Procedure Laterality Date  . ABDOMINAL HYSTERECTOMY     BSO ; endometriosis; ? Appendectomy incidentally  . CHOLECYSTECTOMY  2008  . COLONOSCOPY  2003 & 2012   polyps; Dr Collene Mares  . CYSTOSCOPY  10/19/2016   Procedure: CYSTOSCOPY;  Surgeon: Nickie Retort, MD;  Location: [facility] ORS;  Service: Urology;;  . endoscopy gastritis  2008  . FLEXIBLE SIGMOIDOSCOPY N/A 05/31/2016   Procedure: FLEXIBLE SIGMOIDOSCOPY;  Surgeon: Juanita Craver, MD;  Location: [facility] ENDOSCOPY;  Service: Endoscopy;  Laterality: N/A;  . LAPAROSCOPIC PARTIAL COLECTOMY N/A 10/19/2016   Procedure: LAPAROSCOPIC ASSISTED SIGMIOD COLOECTOMY MOBILIZATION OF SPLEENIC [place];  Surgeon: Jackolyn Confer, MD;  Location: [facility] ORS;  Service: General;  Laterality: N/A;  . ROTATOR CUFF REPAIR     right  . TONSILLECTOMY AND ADENOIDECTOMY      Social History   Socioeconomic History  . Marital status: Married    Spouse name: Not on file  . Number of children: 2  . Years of education: Not on file  . Highest education level: Not on file  Occupational History  . Not on  file  Tobacco Use  . Smoking status: Former Smoker    Quit date: 12/10/1973    Years since quitting: 46.0  . Smokeless tobacco: Never Used  Substance and Sexual Activity  . Alcohol use: Yes    Alcohol/week: 0.0 standard drinks    Comment: 2 glasses of wine daily   . Drug use: No  . Sexual activity: Not on file  Other Topics Concern  . Not on file  Social History Narrative   Left handed      Highest level of edu- college      Lives with husband in two story home   Social Determinants of Health   Financial Resource Strain:   . Difficulty of Paying Living Expenses: Not on file  Food Insecurity:   . Worried About Charity fundraiser in the Last Year: Not on  file  . [place] in the Last Year: Not on file  Transportation Needs:   . Lack of Transportation (Medical): Not on file  . Lack of Transportation (Non-Medical): Not on file  Physical Activity:   . Days of Exercise per Week: Not on file  . Minutes of Exercise per Session: Not on file  Stress:   . Feeling of Stress : Not on file  Social Connections:   . Frequency of Communication with Friends and Family: Not on file  . Frequency of Social Gatherings with Friends and Family: Not on file  . Attends Religious Services: Not on file  . Active Member of Clubs or Organizations: Not on file  . Attends Archivist Meetings: Not on file  . Marital Status: Not on file    Family History  Problem Relation Age of Onset  . Asthma Brother   . Hypertension Father   . Heart attack Father 70  . Leukemia Mother   . Stomach cancer Maternal Aunt   . Breast cancer Sister   . Thyroid disease Sister   . Stroke Neg Hx   . Diabetes Neg Hx     Review of Systems     Objective:  There were no vitals filed for this visit. BP Readings from Last 3 Encounters:  12/22/19 (!) 146/70  11/25/19 (!) 144/82  09/01/19 118/64   Wt Readings from Last 3 Encounters:  12/22/19 210 lb (95.3 kg)  12/18/19 197 lb 8.5 oz (89.6 kg)    04/14/19 210 lb (95.3 kg)   There is no height or weight on file to calculate BMI.   Physical Exam    Constitutional: Appears well-developed and well-nourished. No distress.  HENT:  Head: Normocephalic and atraumatic.  Neck: Neck supple. No tracheal deviation present. No thyromegaly present.  No cervical lymphadenopathy Cardiovascular: Normal rate, regular rhythm and normal heart sounds.   No murmur heard. No carotid bruit .  No edema Pulmonary/Chest: Effort normal and breath sounds normal. No respiratory distress. No has no wheezes. No rales.  Abdomen:  Soft, NT, ND, colostomy in place Skin: Skin is warm and dry. Not diaphoretic.  Psychiatric: Normal mood and affect. Behavior is normal.      Assessment & Plan:    See Problem List for Assessment and Plan of chronic medical problems.    This visit occurred during the SARS-CoV-2 public health emergency.  Safety protocols were in place, including screening questions prior to the visit, additional usage of staff PPE, and extensive cleaning of exam room while observing appropriate contact time as indicated for disinfecting solutions.

## 2019-12-27 NOTE — Assessment & Plan Note (Addendum)
Acute Presumed viral in nature, but it looks like she was not tested for cdif - did not see it Improved in hospital, but now still having diarrhea - not profuse May need to be retested for cdiff if diarrhea does not improve Hopefully as PO intake improves the diarrhea will improve At risk for dehydration Need to monitor closely - will be going to SNF tomorrow

## 2019-12-28 ENCOUNTER — Encounter: Payer: Self-pay | Admitting: Internal Medicine

## 2019-12-29 ENCOUNTER — Ambulatory Visit (INDEPENDENT_AMBULATORY_CARE_PROVIDER_SITE_OTHER): Payer: Medicare Other | Admitting: Internal Medicine

## 2019-12-29 ENCOUNTER — Encounter: Payer: Self-pay | Admitting: Internal Medicine

## 2019-12-29 DIAGNOSIS — R5381 Other malaise: Secondary | ICD-10-CM | POA: Diagnosis not present

## 2019-12-29 DIAGNOSIS — R21 Rash and other nonspecific skin eruption: Secondary | ICD-10-CM | POA: Diagnosis not present

## 2019-12-29 DIAGNOSIS — R627 Adult failure to thrive: Secondary | ICD-10-CM | POA: Diagnosis not present

## 2019-12-29 DIAGNOSIS — E860 Dehydration: Secondary | ICD-10-CM | POA: Diagnosis not present

## 2019-12-29 DIAGNOSIS — R600 Localized edema: Secondary | ICD-10-CM | POA: Diagnosis not present

## 2019-12-29 DIAGNOSIS — R197 Diarrhea, unspecified: Secondary | ICD-10-CM

## 2019-12-29 DIAGNOSIS — N179 Acute kidney failure, unspecified: Secondary | ICD-10-CM | POA: Diagnosis not present

## 2019-12-29 DIAGNOSIS — N3000 Acute cystitis without hematuria: Secondary | ICD-10-CM

## 2019-12-29 MED ORDER — FUROSEMIDE 20 MG PO TABS: 20.0000 mg | ORAL_TABLET | Freq: Every day | ORAL | 1 refills | Status: AC | PRN

## 2019-12-29 NOTE — Assessment & Plan Note (Signed)
Rash on back - started today Itchy ? Cause Improved with aveno cream Concern for possible side effect from megace - one of the only new medications she was started on  Her husband will monitor it for now

## 2019-12-29 NOTE — Assessment & Plan Note (Signed)
Improved with IVF Encouraged increased fluids - her husband is trying to encourage to her to increase fluids

## 2019-12-29 NOTE — Assessment & Plan Note (Signed)
Improved in hospital with IVF Related to dec po intake, diarrhea  Still having diarrhea and decreased po intake Will hold lasix - can take prn if needed monitor

## 2019-12-29 NOTE — Progress Notes (Signed)
Virtual Visit via Video Note  I connected with Toni [name] on 12/29/19 at  2:00 PM EST by a video enabled telemedicine application and verified that I am speaking with the correct person using two identifiers.   I discussed the limitations of evaluation and management by telemedicine and the availability of in person appointments. The patient expressed understanding and agreed to proceed.  Present for the visit:  Myself, Dr Billey Gosling, Harriett Sine and husband Mikki Santee.  The patient is currently at home and I am in the office.  Her husband Mikki Santee provides the history due to her dementia.  No referring provider.    History of Present Illness: The patient is here for follow up from her recent hospitalization.  Admitted 12/17/19 - 12/22/19  She was discharged to home, but will be going into [facility] nursing home tomorrow for rehab.  Her husbnad realized when she came home that he could not care for her. She is still not doing well.   She went to the ED after she was having watery stool for 3-4 days w/o nausea/vomiting, fever/chills, and abdominal pain.  She has had poor po intake and had barely made any urine in the past 24 hrs.    In ED:  WBC 11.3, Cr 2.79.  CT abdomen showed numerous large b/l hepatic and renal cysts, stable partially calcified renal mass with low attenuation and severe aortic atherosclerosis.   AKI on CKD, dehydration: AKI likely prerenal IVF - renal function improved Ct A/P - renal and hepatic cysts  Diarrhea, improving: Etiology unclear No colitis/diverticulitis on non contrast CT covid test negative Diarrhea resolved by d/c  Elevated lipase: No symptoms of pancreatitis - likely related to viral gastroenteritis  Hypokalemia: Supplemented  Resolved  UTI: Treated with ceftriaxone  Completed course  Poor PO intake: Started in megace  Debility, deconditioning: PT/OT recommended SNF - family decided to take her home, but now going to [facility]  tomorrow  FOBT positive: No change in color of stool H/H stable Started on protonix   She is urinating 3-4 times a day only - dark.  Her stool is liquid - gray to dark brown.  She is barely eating.  She is not drinking much.  She has a rash on her back that looks like sunburn.  He put aveno on it and it helped the itching.  He just noticed it today.   She ate more today than she has been eating.    He is concerned about her becoming dehydrated again due to the persist diarrhea and not eating/drinking much.    Review of Systems  Unable to perform ROS: Dementia      Social History   Socioeconomic History  . Marital status: Married    Spouse name: Not on file  . Number of children: 2  . Years of education: Not on file  . Highest education level: Not on file  Occupational History  . Not on file  Tobacco Use  . Smoking status: Former Smoker    Quit date: 12/10/1973    Years since quitting: 46.0  . Smokeless tobacco: Never Used  Substance and Sexual Activity  . Alcohol use: Yes    Alcohol/week: 0.0 standard drinks    Comment: 2 glasses of wine daily   . Drug use: No  . Sexual activity: Not on file  Other Topics Concern  . Not on file  Social History Narrative   Left handed      Highest level of  edu- college      Lives with husband in two story home   Social Determinants of Health   Financial Resource Strain:   . Difficulty of Paying Living Expenses: Not on file  Food Insecurity:   . Worried About Charity fundraiser in the Last Year: Not on file  . Ran Out of Food in the Last Year: Not on file  Transportation Needs:   . Lack of Transportation (Medical): Not on file  . Lack of Transportation (Non-Medical): Not on file  Physical Activity:   . Days of Exercise per Week: Not on file  . Minutes of Exercise per Session: Not on file  Stress:   . Feeling of Stress : Not on file  Social Connections:   . Frequency of Communication with Friends and Family: Not on file   . Frequency of Social Gatherings with Friends and Family: Not on file  . Attends Religious Services: Not on file  . Active Member of Clubs or Organizations: Not on file  . Attends Archivist Meetings: Not on file  . Marital Status: Not on file     Observations/Objective: Appears well in NAD, laying comfortably Breathing normally   Lab Results  Component Value Date   WBC 6.8 12/22/2019   HGB 12.0 12/22/2019   HCT 36.5 12/22/2019   PLT 219 12/22/2019   GLUCOSE 101 (H) 12/22/2019   CHOL 135 09/01/2019   TRIG 120.0 09/01/2019   HDL 57.60 09/01/2019   LDLDIRECT 135.1 11/23/2013   LDLCALC 53 09/01/2019   ALT 12 12/22/2019   AST 12 (L) 12/22/2019   NA 141 12/22/2019   K 3.5 12/22/2019   CL 116 (H) 12/22/2019   CREATININE 1.07 (H) 12/22/2019   BUN 13 12/22/2019   CO2 19 (L) 12/22/2019   TSH 1.436 12/17/2019   INR 1.20 04/05/2015   HGBA1C 5.6 09/01/2019    CT ABDOMEN PELVIS WO CONTRAST CLINICAL DATA:  Abdominal pain.  EXAM: CT ABDOMEN AND PELVIS WITHOUT CONTRAST  TECHNIQUE: Multidetector CT imaging of the abdomen and pelvis was performed following the standard protocol without IV contrast.  COMPARISON:  August 07, 2016  FINDINGS: Lower chest: Mild atelectasis is seen in the posterior aspect right lung base.  Hepatobiliary: A stable 11.9 cm x 10.7 cm cyst is seen within the right lobe of the liver. Additional smaller cysts are seen scattered throughout the liver parenchyma. Surgical clips are seen in the gallbladder fossa.  Pancreas: Unremarkable. No pancreatic ductal dilatation or surrounding inflammatory changes.  Spleen: Normal in size without focal abnormality.  Adrenals/Urinary Tract: Adrenal glands are unremarkable. Kidneys are normal in size, without renal calculi or hydronephrosis. A stable 2.2 cm x 2.4 cm partially calcified low-attenuation mass is seen in the anterior aspect of the mid right kidney. Multiple stable bilateral renal cysts  are seen. Bladder is unremarkable.  Stomach/Bowel: There is a right lower quadrant ostomy site. There is a prior hand. Appendix appears normal. No evidence of bowel wall thickening, distention, or inflammatory changes.  Vascular/Lymphatic: Marked severity aortic atherosclerosis. No enlarged abdominal or pelvic lymph nodes.  Reproductive: Status post hysterectomy. No adnexal masses.  Other: No abdominal wall hernia or abnormality. No abdominopelvic ascites.  Musculoskeletal: Multilevel degenerative changes seen throughout the lumbar spine.  IMPRESSION: 1. Numerous large bilateral hepatic and renal cysts. 2. Evidence of prior cholecystectomy. 3. Small hiatal hernia. 4. Stable partially calcified low-attenuation renal mass. 5. Postop changes with a subsequent left lower quadrant ostomy site.  Electronically Signed  By: Virgina Norfolk M.D.   On: 12/17/2019 18:27   Assessment and Plan:  See Problem List for Assessment and Plan of chronic medical problems.   Follow Up Instructions:    I discussed the assessment and treatment plan with the patient. The patient was provided an opportunity to ask questions and all were answered. The patient agreed with the plan and demonstrated an understanding of the instructions.   The patient was advised to call back or seek an in-person evaluation if the symptoms worsen or if the condition fails to improve as anticipated.    Binnie Rail, MD

## 2019-12-29 NOTE — Assessment & Plan Note (Signed)
Completed ceftriaxone

## 2019-12-29 NOTE — Assessment & Plan Note (Signed)
physically deconditioned - has been very stationary due to dementia, leg swelling, knee pain which has worsened She is very sedentary and deconditioned Husband not able to take care of her at home To go to [facility] tomorrow

## 2019-12-29 NOTE — Assessment & Plan Note (Signed)
Controlled Hold lasix for now given high risk of dehydration

## 2019-12-29 NOTE — Assessment & Plan Note (Signed)
Not eating/drinking much ? Cause - viral gastroenteritis, dementia, Uti On megace - appetite better today and hopefully it will continue to improve monitor

## 2019-12-30 DIAGNOSIS — F0391 Unspecified dementia with behavioral disturbance: Secondary | ICD-10-CM | POA: Diagnosis not present

## 2019-12-30 DIAGNOSIS — I5033 Acute on chronic diastolic (congestive) heart failure: Secondary | ICD-10-CM | POA: Diagnosis not present

## 2019-12-30 DIAGNOSIS — I10 Essential (primary) hypertension: Secondary | ICD-10-CM | POA: Diagnosis not present

## 2019-12-30 DIAGNOSIS — F329 Major depressive disorder, single episode, unspecified: Secondary | ICD-10-CM | POA: Diagnosis not present

## 2019-12-30 DIAGNOSIS — K635 Polyp of colon: Secondary | ICD-10-CM | POA: Diagnosis not present

## 2019-12-30 DIAGNOSIS — M6281 Muscle weakness (generalized): Secondary | ICD-10-CM | POA: Diagnosis not present

## 2019-12-30 DIAGNOSIS — N183 Chronic kidney disease, stage 3 unspecified: Secondary | ICD-10-CM | POA: Diagnosis not present

## 2019-12-30 DIAGNOSIS — R54 Age-related physical debility: Secondary | ICD-10-CM | POA: Diagnosis not present

## 2019-12-30 DIAGNOSIS — N179 Acute kidney failure, unspecified: Secondary | ICD-10-CM | POA: Diagnosis not present

## 2019-12-30 DIAGNOSIS — G309 Alzheimer's disease, unspecified: Secondary | ICD-10-CM | POA: Diagnosis not present

## 2019-12-30 DIAGNOSIS — Q613 Polycystic kidney, unspecified: Secondary | ICD-10-CM | POA: Diagnosis not present

## 2019-12-30 DIAGNOSIS — I5030 Unspecified diastolic (congestive) heart failure: Secondary | ICD-10-CM | POA: Diagnosis not present

## 2019-12-30 DIAGNOSIS — K5790 Diverticulosis of intestine, part unspecified, without perforation or abscess without bleeding: Secondary | ICD-10-CM | POA: Diagnosis not present

## 2019-12-30 DIAGNOSIS — Z23 Encounter for immunization: Secondary | ICD-10-CM | POA: Diagnosis not present

## 2019-12-30 DIAGNOSIS — E876 Hypokalemia: Secondary | ICD-10-CM | POA: Diagnosis not present

## 2019-12-30 DIAGNOSIS — R278 Other lack of coordination: Secondary | ICD-10-CM | POA: Diagnosis not present

## 2019-12-30 DIAGNOSIS — K219 Gastro-esophageal reflux disease without esophagitis: Secondary | ICD-10-CM | POA: Diagnosis not present

## 2019-12-30 DIAGNOSIS — R1312 Dysphagia, oropharyngeal phase: Secondary | ICD-10-CM | POA: Diagnosis not present

## 2019-12-30 DIAGNOSIS — E785 Hyperlipidemia, unspecified: Secondary | ICD-10-CM | POA: Diagnosis not present

## 2019-12-30 DIAGNOSIS — R32 Unspecified urinary incontinence: Secondary | ICD-10-CM | POA: Diagnosis not present

## 2019-12-30 DIAGNOSIS — R2681 Unsteadiness on feet: Secondary | ICD-10-CM | POA: Diagnosis not present

## 2019-12-30 DIAGNOSIS — Z933 Colostomy status: Secondary | ICD-10-CM | POA: Diagnosis not present

## 2019-12-30 DIAGNOSIS — F0390 Unspecified dementia without behavioral disturbance: Secondary | ICD-10-CM | POA: Diagnosis not present

## 2019-12-30 DIAGNOSIS — R630 Anorexia: Secondary | ICD-10-CM | POA: Diagnosis not present

## 2019-12-30 DIAGNOSIS — I700 Atherosclerosis of aorta: Secondary | ICD-10-CM | POA: Diagnosis not present

## 2019-12-31 ENCOUNTER — Other Ambulatory Visit: Payer: Self-pay | Admitting: *Deleted

## 2019-12-31 DIAGNOSIS — E785 Hyperlipidemia, unspecified: Secondary | ICD-10-CM | POA: Diagnosis not present

## 2019-12-31 DIAGNOSIS — Q613 Polycystic kidney, unspecified: Secondary | ICD-10-CM | POA: Diagnosis not present

## 2019-12-31 DIAGNOSIS — G309 Alzheimer's disease, unspecified: Secondary | ICD-10-CM | POA: Diagnosis not present

## 2019-12-31 DIAGNOSIS — Z933 Colostomy status: Secondary | ICD-10-CM | POA: Diagnosis not present

## 2019-12-31 NOTE — Patient Outreach (Signed)
Screened for potential [facility] Care Management needs as a benefit of  [employer].  Toni [name] is currently receiving skilled therapy at [facility].   Writer attended  telephonic interdisciplinary team meeting to assess for disposition needs and transition plan for resident.   Facility staff reports member admitted to [facility] on yesterday. She admitted from home with husband. Facility reports likely transition plan will be for long term care.   Will continue to follow for disposition plans and for potential [facility] Care Management needs while member resides in [facility], [place], RN,BSN [place] [phone] [facility]) [phone]  (Toll free office)

## 2020-01-10 ENCOUNTER — Other Ambulatory Visit: Payer: Self-pay | Admitting: Internal Medicine

## 2020-01-14 ENCOUNTER — Other Ambulatory Visit: Payer: Self-pay | Admitting: *Deleted

## 2020-01-14 NOTE — Patient Outreach (Signed)
Screened for potential [facility] Care Management needs as a benefit of  [employer].  Toni [name] is currently receiving skilled therapy at [facility].  Writer attended telephonic interdisciplinary team meeting to assess for disposition needs and transition plan for resident.   Facility reports member will transition to long term care today. No identifiable [facility] needs at this time.  Toni Rolling, MSN-Ed, RN,BSN [place] Acute Care Coordinator [phone] [facility]) [phone]  (Toll free office)

## 2020-01-17 DIAGNOSIS — I25119 Atherosclerotic heart disease of native coronary artery with unspecified angina pectoris: Secondary | ICD-10-CM | POA: Diagnosis not present

## 2020-01-17 DIAGNOSIS — Z87891 Personal history of nicotine dependence: Secondary | ICD-10-CM | POA: Diagnosis not present

## 2020-01-17 DIAGNOSIS — K219 Gastro-esophageal reflux disease without esophagitis: Secondary | ICD-10-CM | POA: Diagnosis not present

## 2020-01-17 DIAGNOSIS — N183 Chronic kidney disease, stage 3 unspecified: Secondary | ICD-10-CM | POA: Diagnosis not present

## 2020-01-17 DIAGNOSIS — R634 Abnormal weight loss: Secondary | ICD-10-CM | POA: Diagnosis not present

## 2020-01-17 DIAGNOSIS — I69391 Dysphagia following cerebral infarction: Secondary | ICD-10-CM | POA: Diagnosis not present

## 2020-01-17 DIAGNOSIS — I69318 Other symptoms and signs involving cognitive functions following cerebral infarction: Secondary | ICD-10-CM | POA: Diagnosis not present

## 2020-01-17 DIAGNOSIS — F329 Major depressive disorder, single episode, unspecified: Secondary | ICD-10-CM | POA: Diagnosis not present

## 2020-01-17 DIAGNOSIS — F0151 Vascular dementia with behavioral disturbance: Secondary | ICD-10-CM | POA: Diagnosis not present

## 2020-01-17 DIAGNOSIS — N390 Urinary tract infection, site not specified: Secondary | ICD-10-CM | POA: Diagnosis not present

## 2020-01-17 DIAGNOSIS — I130 Hypertensive heart and chronic kidney disease with heart failure and stage 1 through stage 4 chronic kidney disease, or unspecified chronic kidney disease: Secondary | ICD-10-CM | POA: Diagnosis not present

## 2020-01-17 DIAGNOSIS — I509 Heart failure, unspecified: Secondary | ICD-10-CM | POA: Diagnosis not present

## 2020-01-17 DIAGNOSIS — Z934 Other artificial openings of gastrointestinal tract status: Secondary | ICD-10-CM | POA: Diagnosis not present

## 2020-01-18 DIAGNOSIS — I509 Heart failure, unspecified: Secondary | ICD-10-CM | POA: Diagnosis not present

## 2020-01-18 DIAGNOSIS — R634 Abnormal weight loss: Secondary | ICD-10-CM | POA: Diagnosis not present

## 2020-01-18 DIAGNOSIS — I69318 Other symptoms and signs involving cognitive functions following cerebral infarction: Secondary | ICD-10-CM | POA: Diagnosis not present

## 2020-01-18 DIAGNOSIS — I130 Hypertensive heart and chronic kidney disease with heart failure and stage 1 through stage 4 chronic kidney disease, or unspecified chronic kidney disease: Secondary | ICD-10-CM | POA: Diagnosis not present

## 2020-01-18 DIAGNOSIS — I69391 Dysphagia following cerebral infarction: Secondary | ICD-10-CM | POA: Diagnosis not present

## 2020-01-18 DIAGNOSIS — F0151 Vascular dementia with behavioral disturbance: Secondary | ICD-10-CM | POA: Diagnosis not present

## 2020-01-19 DIAGNOSIS — I69318 Other symptoms and signs involving cognitive functions following cerebral infarction: Secondary | ICD-10-CM | POA: Diagnosis not present

## 2020-01-19 DIAGNOSIS — I69391 Dysphagia following cerebral infarction: Secondary | ICD-10-CM | POA: Diagnosis not present

## 2020-01-19 DIAGNOSIS — I130 Hypertensive heart and chronic kidney disease with heart failure and stage 1 through stage 4 chronic kidney disease, or unspecified chronic kidney disease: Secondary | ICD-10-CM | POA: Diagnosis not present

## 2020-01-19 DIAGNOSIS — F0151 Vascular dementia with behavioral disturbance: Secondary | ICD-10-CM | POA: Diagnosis not present

## 2020-01-19 DIAGNOSIS — I509 Heart failure, unspecified: Secondary | ICD-10-CM | POA: Diagnosis not present

## 2020-01-19 DIAGNOSIS — R634 Abnormal weight loss: Secondary | ICD-10-CM | POA: Diagnosis not present

## 2020-01-20 DIAGNOSIS — F329 Major depressive disorder, single episode, unspecified: Secondary | ICD-10-CM | POA: Diagnosis not present

## 2020-01-20 DIAGNOSIS — F0390 Unspecified dementia without behavioral disturbance: Secondary | ICD-10-CM | POA: Diagnosis not present

## 2020-01-20 DIAGNOSIS — F419 Anxiety disorder, unspecified: Secondary | ICD-10-CM | POA: Diagnosis not present

## 2020-01-21 DIAGNOSIS — I130 Hypertensive heart and chronic kidney disease with heart failure and stage 1 through stage 4 chronic kidney disease, or unspecified chronic kidney disease: Secondary | ICD-10-CM | POA: Diagnosis not present

## 2020-01-21 DIAGNOSIS — I69318 Other symptoms and signs involving cognitive functions following cerebral infarction: Secondary | ICD-10-CM | POA: Diagnosis not present

## 2020-01-21 DIAGNOSIS — R634 Abnormal weight loss: Secondary | ICD-10-CM | POA: Diagnosis not present

## 2020-01-21 DIAGNOSIS — I69391 Dysphagia following cerebral infarction: Secondary | ICD-10-CM | POA: Diagnosis not present

## 2020-01-21 DIAGNOSIS — I509 Heart failure, unspecified: Secondary | ICD-10-CM | POA: Diagnosis not present

## 2020-01-21 DIAGNOSIS — F0151 Vascular dementia with behavioral disturbance: Secondary | ICD-10-CM | POA: Diagnosis not present

## 2020-01-25 ENCOUNTER — Telehealth: Payer: Self-pay

## 2020-01-25 DIAGNOSIS — F0151 Vascular dementia with behavioral disturbance: Secondary | ICD-10-CM | POA: Diagnosis not present

## 2020-01-25 DIAGNOSIS — I69391 Dysphagia following cerebral infarction: Secondary | ICD-10-CM | POA: Diagnosis not present

## 2020-01-25 DIAGNOSIS — I69318 Other symptoms and signs involving cognitive functions following cerebral infarction: Secondary | ICD-10-CM | POA: Diagnosis not present

## 2020-01-25 DIAGNOSIS — R634 Abnormal weight loss: Secondary | ICD-10-CM | POA: Diagnosis not present

## 2020-01-25 DIAGNOSIS — I130 Hypertensive heart and chronic kidney disease with heart failure and stage 1 through stage 4 chronic kidney disease, or unspecified chronic kidney disease: Secondary | ICD-10-CM | POA: Diagnosis not present

## 2020-01-25 DIAGNOSIS — I509 Heart failure, unspecified: Secondary | ICD-10-CM | POA: Diagnosis not present

## 2020-01-25 NOTE — Telephone Encounter (Signed)
New message    Patient hubsand calling.    The patient at [facility] wanted to know has patient had a pneumonia shot.

## 2020-01-26 ENCOUNTER — Encounter: Payer: Self-pay | Admitting: Internal Medicine

## 2020-01-26 NOTE — Telephone Encounter (Signed)
Husband aware of dates of pneumonia shots.

## 2020-01-26 NOTE — Telephone Encounter (Signed)
Spoke with husband in regards to message.

## 2020-01-27 DIAGNOSIS — I69391 Dysphagia following cerebral infarction: Secondary | ICD-10-CM | POA: Diagnosis not present

## 2020-01-27 DIAGNOSIS — I69318 Other symptoms and signs involving cognitive functions following cerebral infarction: Secondary | ICD-10-CM | POA: Diagnosis not present

## 2020-01-27 DIAGNOSIS — I130 Hypertensive heart and chronic kidney disease with heart failure and stage 1 through stage 4 chronic kidney disease, or unspecified chronic kidney disease: Secondary | ICD-10-CM | POA: Diagnosis not present

## 2020-01-27 DIAGNOSIS — R634 Abnormal weight loss: Secondary | ICD-10-CM | POA: Diagnosis not present

## 2020-01-27 DIAGNOSIS — I509 Heart failure, unspecified: Secondary | ICD-10-CM | POA: Diagnosis not present

## 2020-01-27 DIAGNOSIS — F0151 Vascular dementia with behavioral disturbance: Secondary | ICD-10-CM | POA: Diagnosis not present

## 2020-01-29 DIAGNOSIS — I509 Heart failure, unspecified: Secondary | ICD-10-CM | POA: Diagnosis not present

## 2020-01-29 DIAGNOSIS — I130 Hypertensive heart and chronic kidney disease with heart failure and stage 1 through stage 4 chronic kidney disease, or unspecified chronic kidney disease: Secondary | ICD-10-CM | POA: Diagnosis not present

## 2020-01-29 DIAGNOSIS — R634 Abnormal weight loss: Secondary | ICD-10-CM | POA: Diagnosis not present

## 2020-01-29 DIAGNOSIS — I69391 Dysphagia following cerebral infarction: Secondary | ICD-10-CM | POA: Diagnosis not present

## 2020-01-29 DIAGNOSIS — I69318 Other symptoms and signs involving cognitive functions following cerebral infarction: Secondary | ICD-10-CM | POA: Diagnosis not present

## 2020-01-29 DIAGNOSIS — F0151 Vascular dementia with behavioral disturbance: Secondary | ICD-10-CM | POA: Diagnosis not present

## 2020-02-01 DIAGNOSIS — I69318 Other symptoms and signs involving cognitive functions following cerebral infarction: Secondary | ICD-10-CM | POA: Diagnosis not present

## 2020-02-01 DIAGNOSIS — Z23 Encounter for immunization: Secondary | ICD-10-CM | POA: Diagnosis not present

## 2020-02-01 DIAGNOSIS — I130 Hypertensive heart and chronic kidney disease with heart failure and stage 1 through stage 4 chronic kidney disease, or unspecified chronic kidney disease: Secondary | ICD-10-CM | POA: Diagnosis not present

## 2020-02-01 DIAGNOSIS — R634 Abnormal weight loss: Secondary | ICD-10-CM | POA: Diagnosis not present

## 2020-02-01 DIAGNOSIS — F0151 Vascular dementia with behavioral disturbance: Secondary | ICD-10-CM | POA: Diagnosis not present

## 2020-02-01 DIAGNOSIS — I69391 Dysphagia following cerebral infarction: Secondary | ICD-10-CM | POA: Diagnosis not present

## 2020-02-01 DIAGNOSIS — I509 Heart failure, unspecified: Secondary | ICD-10-CM | POA: Diagnosis not present

## 2020-02-02 DIAGNOSIS — F0391 Unspecified dementia with behavioral disturbance: Secondary | ICD-10-CM | POA: Diagnosis not present

## 2020-02-02 DIAGNOSIS — K219 Gastro-esophageal reflux disease without esophagitis: Secondary | ICD-10-CM | POA: Diagnosis not present

## 2020-02-02 DIAGNOSIS — R1312 Dysphagia, oropharyngeal phase: Secondary | ICD-10-CM | POA: Diagnosis not present

## 2020-02-04 DIAGNOSIS — F0151 Vascular dementia with behavioral disturbance: Secondary | ICD-10-CM | POA: Diagnosis not present

## 2020-02-04 DIAGNOSIS — I509 Heart failure, unspecified: Secondary | ICD-10-CM | POA: Diagnosis not present

## 2020-02-04 DIAGNOSIS — I69318 Other symptoms and signs involving cognitive functions following cerebral infarction: Secondary | ICD-10-CM | POA: Diagnosis not present

## 2020-02-04 DIAGNOSIS — I69391 Dysphagia following cerebral infarction: Secondary | ICD-10-CM | POA: Diagnosis not present

## 2020-02-04 DIAGNOSIS — I130 Hypertensive heart and chronic kidney disease with heart failure and stage 1 through stage 4 chronic kidney disease, or unspecified chronic kidney disease: Secondary | ICD-10-CM | POA: Diagnosis not present

## 2020-02-04 DIAGNOSIS — R634 Abnormal weight loss: Secondary | ICD-10-CM | POA: Diagnosis not present

## 2020-02-08 DIAGNOSIS — I69318 Other symptoms and signs involving cognitive functions following cerebral infarction: Secondary | ICD-10-CM | POA: Diagnosis not present

## 2020-02-08 DIAGNOSIS — I509 Heart failure, unspecified: Secondary | ICD-10-CM | POA: Diagnosis not present

## 2020-02-08 DIAGNOSIS — F329 Major depressive disorder, single episode, unspecified: Secondary | ICD-10-CM | POA: Diagnosis not present

## 2020-02-08 DIAGNOSIS — I25119 Atherosclerotic heart disease of native coronary artery with unspecified angina pectoris: Secondary | ICD-10-CM | POA: Diagnosis not present

## 2020-02-08 DIAGNOSIS — Z87891 Personal history of nicotine dependence: Secondary | ICD-10-CM | POA: Diagnosis not present

## 2020-02-08 DIAGNOSIS — R634 Abnormal weight loss: Secondary | ICD-10-CM | POA: Diagnosis not present

## 2020-02-08 DIAGNOSIS — N390 Urinary tract infection, site not specified: Secondary | ICD-10-CM | POA: Diagnosis not present

## 2020-02-08 DIAGNOSIS — Z934 Other artificial openings of gastrointestinal tract status: Secondary | ICD-10-CM | POA: Diagnosis not present

## 2020-02-08 DIAGNOSIS — K219 Gastro-esophageal reflux disease without esophagitis: Secondary | ICD-10-CM | POA: Diagnosis not present

## 2020-02-08 DIAGNOSIS — I130 Hypertensive heart and chronic kidney disease with heart failure and stage 1 through stage 4 chronic kidney disease, or unspecified chronic kidney disease: Secondary | ICD-10-CM | POA: Diagnosis not present

## 2020-02-08 DIAGNOSIS — F0151 Vascular dementia with behavioral disturbance: Secondary | ICD-10-CM | POA: Diagnosis not present

## 2020-02-08 DIAGNOSIS — I69391 Dysphagia following cerebral infarction: Secondary | ICD-10-CM | POA: Diagnosis not present

## 2020-02-08 DIAGNOSIS — N183 Chronic kidney disease, stage 3 unspecified: Secondary | ICD-10-CM | POA: Diagnosis not present

## 2020-02-10 DIAGNOSIS — I69391 Dysphagia following cerebral infarction: Secondary | ICD-10-CM | POA: Diagnosis not present

## 2020-02-10 DIAGNOSIS — F329 Major depressive disorder, single episode, unspecified: Secondary | ICD-10-CM | POA: Diagnosis not present

## 2020-02-10 DIAGNOSIS — R634 Abnormal weight loss: Secondary | ICD-10-CM | POA: Diagnosis not present

## 2020-02-10 DIAGNOSIS — I130 Hypertensive heart and chronic kidney disease with heart failure and stage 1 through stage 4 chronic kidney disease, or unspecified chronic kidney disease: Secondary | ICD-10-CM | POA: Diagnosis not present

## 2020-02-10 DIAGNOSIS — F0151 Vascular dementia with behavioral disturbance: Secondary | ICD-10-CM | POA: Diagnosis not present

## 2020-02-10 DIAGNOSIS — F0390 Unspecified dementia without behavioral disturbance: Secondary | ICD-10-CM | POA: Diagnosis not present

## 2020-02-10 DIAGNOSIS — I509 Heart failure, unspecified: Secondary | ICD-10-CM | POA: Diagnosis not present

## 2020-02-10 DIAGNOSIS — F419 Anxiety disorder, unspecified: Secondary | ICD-10-CM | POA: Diagnosis not present

## 2020-02-10 DIAGNOSIS — R630 Anorexia: Secondary | ICD-10-CM | POA: Diagnosis not present

## 2020-02-10 DIAGNOSIS — I69318 Other symptoms and signs involving cognitive functions following cerebral infarction: Secondary | ICD-10-CM | POA: Diagnosis not present

## 2020-02-15 DIAGNOSIS — F0151 Vascular dementia with behavioral disturbance: Secondary | ICD-10-CM | POA: Diagnosis not present

## 2020-02-15 DIAGNOSIS — R634 Abnormal weight loss: Secondary | ICD-10-CM | POA: Diagnosis not present

## 2020-02-15 DIAGNOSIS — I130 Hypertensive heart and chronic kidney disease with heart failure and stage 1 through stage 4 chronic kidney disease, or unspecified chronic kidney disease: Secondary | ICD-10-CM | POA: Diagnosis not present

## 2020-02-15 DIAGNOSIS — I69391 Dysphagia following cerebral infarction: Secondary | ICD-10-CM | POA: Diagnosis not present

## 2020-02-15 DIAGNOSIS — I69318 Other symptoms and signs involving cognitive functions following cerebral infarction: Secondary | ICD-10-CM | POA: Diagnosis not present

## 2020-02-15 DIAGNOSIS — I509 Heart failure, unspecified: Secondary | ICD-10-CM | POA: Diagnosis not present

## 2020-02-16 DIAGNOSIS — I130 Hypertensive heart and chronic kidney disease with heart failure and stage 1 through stage 4 chronic kidney disease, or unspecified chronic kidney disease: Secondary | ICD-10-CM | POA: Diagnosis not present

## 2020-02-16 DIAGNOSIS — I69318 Other symptoms and signs involving cognitive functions following cerebral infarction: Secondary | ICD-10-CM | POA: Diagnosis not present

## 2020-02-16 DIAGNOSIS — I69391 Dysphagia following cerebral infarction: Secondary | ICD-10-CM | POA: Diagnosis not present

## 2020-02-16 DIAGNOSIS — F0151 Vascular dementia with behavioral disturbance: Secondary | ICD-10-CM | POA: Diagnosis not present

## 2020-02-16 DIAGNOSIS — R634 Abnormal weight loss: Secondary | ICD-10-CM | POA: Diagnosis not present

## 2020-02-16 DIAGNOSIS — I509 Heart failure, unspecified: Secondary | ICD-10-CM | POA: Diagnosis not present

## 2020-02-17 DIAGNOSIS — I69391 Dysphagia following cerebral infarction: Secondary | ICD-10-CM | POA: Diagnosis not present

## 2020-02-17 DIAGNOSIS — I69318 Other symptoms and signs involving cognitive functions following cerebral infarction: Secondary | ICD-10-CM | POA: Diagnosis not present

## 2020-02-17 DIAGNOSIS — I509 Heart failure, unspecified: Secondary | ICD-10-CM | POA: Diagnosis not present

## 2020-02-17 DIAGNOSIS — F0151 Vascular dementia with behavioral disturbance: Secondary | ICD-10-CM | POA: Diagnosis not present

## 2020-02-17 DIAGNOSIS — R634 Abnormal weight loss: Secondary | ICD-10-CM | POA: Diagnosis not present

## 2020-02-17 DIAGNOSIS — I130 Hypertensive heart and chronic kidney disease with heart failure and stage 1 through stage 4 chronic kidney disease, or unspecified chronic kidney disease: Secondary | ICD-10-CM | POA: Diagnosis not present

## 2020-02-19 DIAGNOSIS — R634 Abnormal weight loss: Secondary | ICD-10-CM | POA: Diagnosis not present

## 2020-02-19 DIAGNOSIS — I509 Heart failure, unspecified: Secondary | ICD-10-CM | POA: Diagnosis not present

## 2020-02-19 DIAGNOSIS — I69391 Dysphagia following cerebral infarction: Secondary | ICD-10-CM | POA: Diagnosis not present

## 2020-02-19 DIAGNOSIS — I130 Hypertensive heart and chronic kidney disease with heart failure and stage 1 through stage 4 chronic kidney disease, or unspecified chronic kidney disease: Secondary | ICD-10-CM | POA: Diagnosis not present

## 2020-02-19 DIAGNOSIS — I69318 Other symptoms and signs involving cognitive functions following cerebral infarction: Secondary | ICD-10-CM | POA: Diagnosis not present

## 2020-02-19 DIAGNOSIS — F0151 Vascular dementia with behavioral disturbance: Secondary | ICD-10-CM | POA: Diagnosis not present

## 2020-02-22 DIAGNOSIS — R634 Abnormal weight loss: Secondary | ICD-10-CM | POA: Diagnosis not present

## 2020-02-22 DIAGNOSIS — I130 Hypertensive heart and chronic kidney disease with heart failure and stage 1 through stage 4 chronic kidney disease, or unspecified chronic kidney disease: Secondary | ICD-10-CM | POA: Diagnosis not present

## 2020-02-22 DIAGNOSIS — I69318 Other symptoms and signs involving cognitive functions following cerebral infarction: Secondary | ICD-10-CM | POA: Diagnosis not present

## 2020-02-22 DIAGNOSIS — F0151 Vascular dementia with behavioral disturbance: Secondary | ICD-10-CM | POA: Diagnosis not present

## 2020-02-22 DIAGNOSIS — I509 Heart failure, unspecified: Secondary | ICD-10-CM | POA: Diagnosis not present

## 2020-02-22 DIAGNOSIS — I69391 Dysphagia following cerebral infarction: Secondary | ICD-10-CM | POA: Diagnosis not present

## 2020-02-24 DIAGNOSIS — I69318 Other symptoms and signs involving cognitive functions following cerebral infarction: Secondary | ICD-10-CM | POA: Diagnosis not present

## 2020-02-24 DIAGNOSIS — F0151 Vascular dementia with behavioral disturbance: Secondary | ICD-10-CM | POA: Diagnosis not present

## 2020-02-24 DIAGNOSIS — I69391 Dysphagia following cerebral infarction: Secondary | ICD-10-CM | POA: Diagnosis not present

## 2020-02-24 DIAGNOSIS — I509 Heart failure, unspecified: Secondary | ICD-10-CM | POA: Diagnosis not present

## 2020-02-24 DIAGNOSIS — R634 Abnormal weight loss: Secondary | ICD-10-CM | POA: Diagnosis not present

## 2020-02-24 DIAGNOSIS — I130 Hypertensive heart and chronic kidney disease with heart failure and stage 1 through stage 4 chronic kidney disease, or unspecified chronic kidney disease: Secondary | ICD-10-CM | POA: Diagnosis not present

## 2020-03-02 DIAGNOSIS — F419 Anxiety disorder, unspecified: Secondary | ICD-10-CM | POA: Diagnosis not present

## 2020-03-02 DIAGNOSIS — R634 Abnormal weight loss: Secondary | ICD-10-CM | POA: Diagnosis not present

## 2020-03-02 DIAGNOSIS — I69391 Dysphagia following cerebral infarction: Secondary | ICD-10-CM | POA: Diagnosis not present

## 2020-03-02 DIAGNOSIS — F0390 Unspecified dementia without behavioral disturbance: Secondary | ICD-10-CM | POA: Diagnosis not present

## 2020-03-02 DIAGNOSIS — F329 Major depressive disorder, single episode, unspecified: Secondary | ICD-10-CM | POA: Diagnosis not present

## 2020-03-02 DIAGNOSIS — R630 Anorexia: Secondary | ICD-10-CM | POA: Diagnosis not present

## 2020-03-02 DIAGNOSIS — F0151 Vascular dementia with behavioral disturbance: Secondary | ICD-10-CM | POA: Diagnosis not present

## 2020-03-02 DIAGNOSIS — I130 Hypertensive heart and chronic kidney disease with heart failure and stage 1 through stage 4 chronic kidney disease, or unspecified chronic kidney disease: Secondary | ICD-10-CM | POA: Diagnosis not present

## 2020-03-02 DIAGNOSIS — I69318 Other symptoms and signs involving cognitive functions following cerebral infarction: Secondary | ICD-10-CM | POA: Diagnosis not present

## 2020-03-02 DIAGNOSIS — I509 Heart failure, unspecified: Secondary | ICD-10-CM | POA: Diagnosis not present

## 2020-03-10 DEATH — deceased
